# Patient Record
Sex: Male | Born: 1945 | Race: White | Hispanic: No | Marital: Married | Smoking: Never smoker
Health system: Southern US, Community
[De-identification: ages and names within clinical notes are randomized; demographics above are authoritative.]

## PROBLEM LIST (undated history)

## (undated) DIAGNOSIS — C801 Malignant (primary) neoplasm, unspecified: Secondary | ICD-10-CM

## (undated) DIAGNOSIS — M25561 Pain in right knee: Secondary | ICD-10-CM

## (undated) DIAGNOSIS — I10 Essential (primary) hypertension: Secondary | ICD-10-CM

## (undated) HISTORY — PX: TONSILLECTOMY: SUR1361

## (undated) HISTORY — PX: FOOT SURGERY: SHX648

---

## 2004-12-03 ENCOUNTER — Ambulatory Visit (HOSPITAL_COMMUNITY): Admission: RE | Admit: 2004-12-03 | Discharge: 2004-12-03 | Payer: Self-pay | Admitting: Gastroenterology

## 2009-05-20 ENCOUNTER — Ambulatory Visit (HOSPITAL_BASED_OUTPATIENT_CLINIC_OR_DEPARTMENT_OTHER): Admission: RE | Admit: 2009-05-20 | Discharge: 2009-05-20 | Payer: Self-pay | Admitting: Family Medicine

## 2009-05-26 ENCOUNTER — Ambulatory Visit: Payer: Self-pay | Admitting: Internal Medicine

## 2011-10-10 ENCOUNTER — Emergency Department (HOSPITAL_COMMUNITY)
Admission: EM | Admit: 2011-10-10 | Discharge: 2011-10-11 | Disposition: A | Discharge status: Home / Self Care | Payer: BC Managed Care – PPO | Attending: Emergency Medicine | Admitting: Emergency Medicine

## 2011-10-10 DIAGNOSIS — R42 Dizziness and giddiness: Secondary | ICD-10-CM | POA: Insufficient documentation

## 2011-10-10 DIAGNOSIS — Z7982 Long term (current) use of aspirin: Secondary | ICD-10-CM | POA: Insufficient documentation

## 2011-10-10 DIAGNOSIS — I10 Essential (primary) hypertension: Secondary | ICD-10-CM | POA: Insufficient documentation

## 2011-10-10 DIAGNOSIS — Z79899 Other long term (current) drug therapy: Secondary | ICD-10-CM | POA: Insufficient documentation

## 2011-10-10 DIAGNOSIS — M542 Cervicalgia: Secondary | ICD-10-CM | POA: Insufficient documentation

## 2011-10-10 DIAGNOSIS — M25519 Pain in unspecified shoulder: Secondary | ICD-10-CM | POA: Insufficient documentation

## 2011-10-10 HISTORY — DX: Essential (primary) hypertension: I10

## 2011-10-10 NOTE — ED Notes (Addendum)
C/o stiff L neck/shoulder, describes as achy & sore, checked BP and it was higher than usual. "feels off". (denies: HA, dizziness, nv, visual changes, CP, sob, numbness/ tingling or other sx), onset around 2145. Speech clear, no droop or drift, grips strength and coordination are equal and strong, PERRL 3mm.

## 2011-10-11 ENCOUNTER — Emergency Department (HOSPITAL_COMMUNITY): Payer: BC Managed Care – PPO

## 2011-10-11 ENCOUNTER — Encounter (HOSPITAL_COMMUNITY): Payer: Self-pay | Admitting: *Deleted

## 2011-10-11 LAB — URINALYSIS, ROUTINE W REFLEX MICROSCOPIC
Bilirubin Urine: NEGATIVE
Glucose, UA: NEGATIVE mg/dL
Hgb urine dipstick: NEGATIVE
Ketones, ur: NEGATIVE mg/dL
Leukocytes, UA: NEGATIVE
Nitrite: NEGATIVE
Protein, ur: NEGATIVE mg/dL
Specific Gravity, Urine: 1.011 (ref 1.005–1.030)
Urobilinogen, UA: 0.2 mg/dL (ref 0.0–1.0)
pH: 7 (ref 5.0–8.0)

## 2011-10-11 LAB — COMPREHENSIVE METABOLIC PANEL
ALT: 73 U/L — ABNORMAL HIGH (ref 0–53)
AST: 51 U/L — ABNORMAL HIGH (ref 0–37)
Albumin: 3.8 g/dL (ref 3.5–5.2)
Alkaline Phosphatase: 56 U/L (ref 39–117)
BUN: 21 mg/dL (ref 6–23)
CO2: 26 mEq/L (ref 19–32)
Calcium: 9.2 mg/dL (ref 8.4–10.5)
Chloride: 102 mEq/L (ref 96–112)
Creatinine, Ser: 0.8 mg/dL (ref 0.50–1.35)
GFR calc Af Amer: >90 mL/min (ref >90)
GFR calc non Af Amer: >90 mL/min (ref >90)
Glucose, Bld: 134 mg/dL — ABNORMAL HIGH (ref 70–99)
Potassium: 4.5 mEq/L (ref 3.5–5.1)
Sodium: 137 mEq/L (ref 135–145)
Total Bilirubin: 0.4 mg/dL (ref 0.3–1.2)
Total Protein: 6.6 g/dL (ref 6.0–8.3)

## 2011-10-11 LAB — CBC
HCT: 41.8 % (ref 39.0–52.0)
Hemoglobin: 14.9 g/dL (ref 13.0–17.0)
MCH: 30 pg (ref 26.0–34.0)
MCHC: 35.6 g/dL (ref 30.0–36.0)
MCV: 84.3 fL (ref 78.0–100.0)
Platelets: 154 10*3/uL (ref 150–400)
RBC: 4.96 MIL/uL (ref 4.22–5.81)
RDW: 13.7 % (ref 11.5–15.5)
WBC: 7.1 10*3/uL (ref 4.0–10.5)

## 2011-10-11 LAB — LIPASE, BLOOD: Lipase: 36 U/L (ref 11–59)

## 2011-10-11 LAB — TROPONIN I: Troponin I: <0.3 ng/mL (ref <0.30)

## 2011-10-11 MED ORDER — SODIUM CHLORIDE 0.9 % IV SOLN
INTRAVENOUS | Status: DC
  Administered 2011-10-11: 02:00:00 via INTRAVENOUS

## 2011-10-11 MED ORDER — SODIUM CHLORIDE 0.9 % IV BOLUS (SEPSIS)
250.0000 mL | Freq: Once | INTRAVENOUS | Status: AC
  Administered 2011-10-11: 250 mL via INTRAVENOUS

## 2011-10-11 NOTE — ED Provider Notes (Signed)
History     CSN: [id number]  Arrival date & time 10/10/11  2340   First MD Initiated Contact with Patient 10/11/11 [phone]      Chief Complaint  Patient presents with  . Hypertension  . Neck Pain    (Consider location/radiation/quality/duration/timing/severity/associated sxs/prior treatment) The history is provided by the patient and the spouse.   patient is a 66 year old male that around 10:00 had a lightheaded feeling and some mild left neck and shoulder discomfort and no anterior chest pain no shortness of breath no syncope no nausea no vomiting no history of similar pain. Past medical history significant only for hypertension no history of cardiac events. Patient stated that the discomfort was extremely mild and vague not severe.  Past Medical History  Diagnosis Date  . Hypertension     Past Surgical History  Procedure Date  . Foot surgery   . Tonsillectomy     No family history on file.  History  Substance Use Topics  . Smoking status: Never Smoker   . Smokeless tobacco: Not on file  . Alcohol Use: No      Review of Systems  Constitutional: Negative for fever and chills.  HENT: Positive for neck pain.   Eyes: Negative for redness.  Respiratory: Negative for shortness of breath.   Cardiovascular: Negative for chest pain and palpitations.  Gastrointestinal: Negative for nausea, vomiting, abdominal pain and diarrhea.  Genitourinary: Negative for dysuria.  Musculoskeletal: Negative for back pain.  Skin: Negative for rash.  Neurological: Positive for light-headedness. Negative for dizziness, syncope, weakness and headaches.  Hematological: Does not bruise/bleed easily.    Allergies  Review of patient's allergies indicates no known allergies.  Home Medications   Current Outpatient Rx  Name Route Sig Dispense Refill  . AMLODIPINE BESY-BENAZEPRIL HCL 5-10 MG PO CAPS Oral Take 1 capsule by mouth daily.    Marland Kitchen VITAMIN C 1000 MG PO TABS Oral Take 1,000 mg by mouth 2  (two) times daily.    . ASPIRIN EC 81 MG PO TBEC Oral Take 81 mg by mouth daily.    Marland Kitchen CALCIUM CITRATE-VITAMIN D 315-200 MG-UNIT PO TABS Oral Take 1 tablet by mouth 2 (two) times daily.    Marland Kitchen DOXAZOSIN MESYLATE 4 MG PO TABS Oral Take 4 mg by mouth daily.    . OMEGA-3 FATTY ACIDS 1000 MG PO CAPS Oral Take 2 g by mouth daily.    . ADULT MULTIVITAMIN W/MINERALS CH Oral Take 1 tablet by mouth daily.    . SAW PALMETTO (SERENOA REPENS) 500 MG PO CAPS Oral Take 500 mg by mouth daily.      BP 127/70  Pulse 83  Temp(Src) 98.2 F (36.8 C) (Oral)  Resp 17  Ht 5\' 11"  (1.803 m)  Wt 275 lb (124.739 kg)  BMI 38.35 kg/m2  SpO2 96%  Physical Exam  Nursing note and vitals reviewed. Constitutional: He is oriented to person, place, and time. He appears well-developed and well-nourished. No distress.  HENT:  Head: Normocephalic and atraumatic.  Mouth/Throat: Oropharynx is clear and moist.  Eyes: Conjunctivae and EOM are normal. Pupils are equal, round, and reactive to light.  Neck: Normal range of motion. Neck supple.  Cardiovascular: Normal rate, regular rhythm and normal heart sounds.   No murmur heard. Pulmonary/Chest: Effort normal and breath sounds normal. No respiratory distress. He has no wheezes. He has no rales. He exhibits no tenderness.  Abdominal: Soft. Bowel sounds are normal. There is no tenderness.  Musculoskeletal: Normal range of  motion. He exhibits no edema and no tenderness.  Neurological: He is oriented to person, place, and time. No cranial nerve deficit. He exhibits normal muscle tone. Coordination normal.  Skin: Skin is warm. No rash noted. No erythema.    ED Course  Procedures (including critical care time)  Labs Reviewed  COMPREHENSIVE METABOLIC PANEL - Abnormal; Notable for the following:    Glucose, Bld 134 (*)    AST 51 (*) HEMOLYSIS AT THIS LEVEL MAY AFFECT RESULT   ALT 73 (*)    All other components within normal limits  CBC  TROPONIN I  URINALYSIS, ROUTINE W  REFLEX MICROSCOPIC  LIPASE, BLOOD   Dg Chest 2 View  10/11/2011  *RADIOLOGY REPORT*  Clinical Data: Near-syncope; left-sided neck pain.  CHEST - 2 VIEW  Comparison: None.  Findings: The lungs are well-aerated and clear.  There is no evidence of focal opacification, pleural effusion or pneumothorax.  The heart is normal in size; the mediastinal contour is within normal limits.  No acute osseous abnormalities are seen.  IMPRESSION: No acute cardiopulmonary process seen.  Original Report Authenticated By: Tonia Ghent, M.D.    Date: 10/11/2011  Rate: 84  Rhythm: normal sinus rhythm  QRS Axis: normal  Intervals: normal  ST/T Wave abnormalities: nonspecific ST/T changes  Conduction Disutrbances:none  Narrative Interpretation:   Old EKG Reviewed: none available  Results for orders placed during the hospital encounter of 10/10/11  CBC      Component Value Range   WBC 7.1  4.0 - 10.5 (K/uL)   RBC 4.96  4.22 - 5.81 (MIL/uL)   Hemoglobin 14.9  13.0 - 17.0 (g/dL)   HCT 96.0  45.4 - 09.8 (%)   MCV 84.3  78.0 - 100.0 (fL)   MCH 30.0  26.0 - 34.0 (pg)   MCHC 35.6  30.0 - 36.0 (g/dL)   RDW 11.9  14.7 - 82.9 (%)   Platelets 154  150 - 400 (K/uL)  COMPREHENSIVE METABOLIC PANEL      Component Value Range   Sodium 137  135 - 145 (mEq/L)   Potassium 4.5  3.5 - 5.1 (mEq/L)   Chloride 102  96 - 112 (mEq/L)   CO2 26  19 - 32 (mEq/L)   Glucose, Bld 134 (*) 70 - 99 (mg/dL)   BUN 21  6 - 23 (mg/dL)   Creatinine, Ser 5.62  0.50 - 1.35 (mg/dL)   Calcium 9.2  8.4 - 13.0 (mg/dL)   Total Protein 6.6  6.0 - 8.3 (g/dL)   Albumin 3.8  3.5 - 5.2 (g/dL)   AST 51 (*) 0 - 37 (U/L)   ALT 73 (*) 0 - 53 (U/L)   Alkaline Phosphatase 56  39 - 117 (U/L)   Total Bilirubin 0.4  0.3 - 1.2 (mg/dL)   GFR calc non Af Amer >90  >90 (mL/min)   GFR calc Af Amer >90  >90 (mL/min)  TROPONIN I      Component Value Range   Troponin I <0.30  <0.30 (ng/mL)  URINALYSIS, ROUTINE W REFLEX MICROSCOPIC      Component Value Range     Color, Urine YELLOW  YELLOW    APPearance CLEAR  CLEAR    Specific Gravity, Urine 1.011  1.005 - 1.030    pH 7.0  5.0 - 8.0    Glucose, UA NEGATIVE  NEGATIVE (mg/dL)   Hgb urine dipstick NEGATIVE  NEGATIVE    Bilirubin Urine NEGATIVE  NEGATIVE    Ketones, ur NEGATIVE  NEGATIVE (mg/dL)   Protein, ur NEGATIVE  NEGATIVE (mg/dL)   Urobilinogen, UA 0.2  0.0 - 1.0 (mg/dL)   Nitrite NEGATIVE  NEGATIVE    Leukocytes, UA NEGATIVE  NEGATIVE   LIPASE, BLOOD      Component Value Range   Lipase 36  11 - 59 (U/L)     1. Lightheadedness       MDM  Patient's symptoms of some mild neck and left shoulder discomfort and lightheadedness no shortness of breath no chest pain around 10:00 without any significant abnormalities on labs. Patient had no chest discomfort at all. In the emergency department patient is felt fine no recurrent symptoms no new symptoms. Doubt acute cardiac event based on the EKG in at least one negative troponin marker was negative had no chest discomfort at all.       Shelda Jakes, MD 10/11/11 [phone]

## 2011-10-11 NOTE — Discharge Instructions (Signed)
Specific cause of tonight's symptoms is not clear. Lab workup was negative other than some mild elevation in liver enzymes. Return for any new worse symptoms. Followup with your primary care Dr. Dr. Andrey Campanile sometime in the next one to 2 weeks to have liver enzymes rechecked.

## 2011-10-11 NOTE — ED Notes (Signed)
Patient is AOx4 and comfortable with his discharge instructions. 

## 2011-10-23 ENCOUNTER — Other Ambulatory Visit (HOSPITAL_COMMUNITY): Payer: Self-pay | Admitting: Family Medicine

## 2011-10-23 DIAGNOSIS — M25561 Pain in right knee: Secondary | ICD-10-CM

## 2011-10-29 ENCOUNTER — Encounter (HOSPITAL_COMMUNITY): Payer: Self-pay

## 2011-10-29 ENCOUNTER — Encounter (HOSPITAL_COMMUNITY)
Admission: RE | Admit: 2011-10-29 | Discharge: 2011-10-29 | Disposition: A | Discharge status: Home / Self Care | Payer: BC Managed Care – PPO | Source: Ambulatory Visit | Attending: Family Medicine | Admitting: Family Medicine

## 2011-10-29 ENCOUNTER — Ambulatory Visit (HOSPITAL_COMMUNITY): Payer: BC Managed Care – PPO

## 2011-10-29 ENCOUNTER — Encounter (HOSPITAL_COMMUNITY): Payer: BC Managed Care – PPO

## 2011-10-29 DIAGNOSIS — M25561 Pain in right knee: Secondary | ICD-10-CM

## 2011-10-29 DIAGNOSIS — M25569 Pain in unspecified knee: Secondary | ICD-10-CM | POA: Insufficient documentation

## 2011-10-29 HISTORY — DX: Pain in right knee: M25.561

## 2011-10-29 MED ORDER — TECHNETIUM TC 99M MEDRONATE IV KIT
23.7000 | PACK | Freq: Once | INTRAVENOUS | Status: AC | PRN
  Administered 2011-10-29: 23.7 via INTRAVENOUS

## 2012-01-24 ENCOUNTER — Emergency Department (HOSPITAL_COMMUNITY): Payer: BC Managed Care – PPO

## 2012-01-24 ENCOUNTER — Encounter (HOSPITAL_COMMUNITY): Payer: Self-pay | Admitting: *Deleted

## 2012-01-24 ENCOUNTER — Emergency Department (HOSPITAL_COMMUNITY)
Admission: EM | Admit: 2012-01-24 | Discharge: 2012-01-24 | Disposition: A | Discharge status: Home / Self Care | Payer: BC Managed Care – PPO | Attending: Emergency Medicine | Admitting: Emergency Medicine

## 2012-01-24 DIAGNOSIS — K5900 Constipation, unspecified: Secondary | ICD-10-CM | POA: Insufficient documentation

## 2012-01-24 DIAGNOSIS — I10 Essential (primary) hypertension: Secondary | ICD-10-CM | POA: Insufficient documentation

## 2012-01-24 DIAGNOSIS — Z79899 Other long term (current) drug therapy: Secondary | ICD-10-CM | POA: Insufficient documentation

## 2012-01-24 DIAGNOSIS — R109 Unspecified abdominal pain: Secondary | ICD-10-CM | POA: Insufficient documentation

## 2012-01-24 DIAGNOSIS — K5730 Diverticulosis of large intestine without perforation or abscess without bleeding: Secondary | ICD-10-CM

## 2012-01-24 LAB — CBC WITH DIFFERENTIAL/PLATELET
Basophils Absolute: 0 10*3/uL (ref 0.0–0.1)
Basophils Relative: 0 % (ref 0–1)
Eosinophils Absolute: 0.1 10*3/uL (ref 0.0–0.7)
Eosinophils Relative: 1 % (ref 0–5)
HCT: 43.4 % (ref 39.0–52.0)
Hemoglobin: 15.4 g/dL (ref 13.0–17.0)
Lymphocytes Relative: 10 % — ABNORMAL LOW (ref 12–46)
Lymphs Abs: 1.2 10*3/uL (ref 0.7–4.0)
MCH: 30 pg (ref 26.0–34.0)
MCHC: 35.5 g/dL (ref 30.0–36.0)
MCV: 84.6 fL (ref 78.0–100.0)
Monocytes Absolute: 1.7 10*3/uL — ABNORMAL HIGH (ref 0.1–1.0)
Monocytes Relative: 15 % — ABNORMAL HIGH (ref 3–12)
Neutro Abs: 8.7 10*3/uL — ABNORMAL HIGH (ref 1.7–7.7)
Neutrophils Relative %: 74 % (ref 43–77)
Platelets: 156 10*3/uL (ref 150–400)
RBC: 5.13 MIL/uL (ref 4.22–5.81)
RDW: 13.3 % (ref 11.5–15.5)
WBC: 11.7 10*3/uL — ABNORMAL HIGH (ref 4.0–10.5)

## 2012-01-24 LAB — POCT I-STAT, CHEM 8
BUN: 15 mg/dL (ref 6–23)
Calcium, Ion: 1.15 mmol/L (ref 1.13–1.30)
Chloride: 102 mEq/L (ref 96–112)
Creatinine, Ser: 0.9 mg/dL (ref 0.50–1.35)
Glucose, Bld: 142 mg/dL — ABNORMAL HIGH (ref 70–99)
HCT: 46 % (ref 39.0–52.0)
Hemoglobin: 15.6 g/dL (ref 13.0–17.0)
Potassium: 3.9 mEq/L (ref 3.5–5.1)
Sodium: 139 mEq/L (ref 135–145)
TCO2: 23 mmol/L (ref 0–100)

## 2012-01-24 LAB — URINALYSIS, ROUTINE W REFLEX MICROSCOPIC
Bilirubin Urine: NEGATIVE
Glucose, UA: NEGATIVE mg/dL
Hgb urine dipstick: NEGATIVE
Ketones, ur: NEGATIVE mg/dL
Leukocytes, UA: NEGATIVE
Nitrite: NEGATIVE
Protein, ur: NEGATIVE mg/dL
Specific Gravity, Urine: 1.008 (ref 1.005–1.030)
Urobilinogen, UA: 0.2 mg/dL (ref 0.0–1.0)
pH: 6.5 (ref 5.0–8.0)

## 2012-01-24 MED ORDER — METRONIDAZOLE IN NACL 5-0.79 MG/ML-% IV SOLN
500.0000 mg | Freq: Once | INTRAVENOUS | Status: AC
  Administered 2012-01-24: 500 mg via INTRAVENOUS
  Filled 2012-01-24: qty 100

## 2012-01-24 MED ORDER — CIPROFLOXACIN HCL 500 MG PO TABS: 500.0000 mg | ORAL_TABLET | Freq: Two times a day (BID) | ORAL | Status: AC

## 2012-01-24 MED ORDER — IOHEXOL 300 MG/ML  SOLN
100.0000 mL | Freq: Once | INTRAMUSCULAR | Status: AC | PRN
  Administered 2012-01-24: 100 mL via INTRAVENOUS

## 2012-01-24 MED ORDER — HYDROCODONE-ACETAMINOPHEN 5-325 MG PO TABS: 1.0000 | ORAL_TABLET | Freq: Four times a day (QID) | ORAL | Status: AC | PRN

## 2012-01-24 MED ORDER — ACETAMINOPHEN 325 MG PO TABS
650.0000 mg | ORAL_TABLET | Freq: Once | ORAL | Status: AC
  Administered 2012-01-24: 650 mg via ORAL
  Filled 2012-01-24: qty 2

## 2012-01-24 MED ORDER — CIPROFLOXACIN IN D5W 400 MG/200ML IV SOLN
400.0000 mg | Freq: Once | INTRAVENOUS | Status: AC
  Administered 2012-01-24: 400 mg via INTRAVENOUS
  Filled 2012-01-24: qty 200

## 2012-01-24 MED ORDER — METRONIDAZOLE 500 MG PO TABS: 500.0000 mg | ORAL_TABLET | Freq: Two times a day (BID) | ORAL | Status: AC

## 2012-01-24 NOTE — ED Notes (Signed)
Pt. Reports constipation x 2 days. States he had a BM last night but it was hard. Reports pain in lower right abdomen. Denies N/V/D or fevers. Bowel sounds active, abdomen soft and minimally tender to palpitation. No rebound tenderness.

## 2012-01-24 NOTE — ED Notes (Signed)
C/o RLQ pain, onset yesterday 10am, denies other sx,  (Denies: nvd, fever).

## 2012-01-24 NOTE — ED Notes (Signed)
RN informed of pt temp.

## 2012-01-24 NOTE — ED Provider Notes (Signed)
History     CSN: [id number]  Arrival date & time 01/24/12  6295   First MD Initiated Contact with Patient 01/24/12 [phone]      Chief Complaint  Patient presents with  . Abdominal Pain    (Consider location/radiation/quality/duration/timing/severity/associated sxs/prior treatment) HPI The patient presents to the Emergency department with abdominal pain that began yesterday.  The pain is in both lower parts of his abdomen.  He denies nausea, vomiting, diarrhea, chest pain, shortness of breath, back pain, fevers, dysuria, weakness, or headache.  States he did feel chilled last night.  Patient denies any treatment prior to arrival.  Patient states that he's never had abdominal surgery.  The patient  states that palpation and movement make the pain worse. Past Medical History  Diagnosis Date  . Hypertension   . Knee pain, right     Past Surgical History  Procedure Date  . Foot surgery   . Tonsillectomy     No family history on file.  History  Substance Use Topics  . Smoking status: Never Smoker   . Smokeless tobacco: Not on file  . Alcohol Use: No      Review of Systems All other systems negative except as documented in the HPI. All pertinent positives and negatives as reviewed in the HPI.  Allergies  Review of patient's allergies indicates no known allergies.  Home Medications   Current Outpatient Rx  Name Route Sig Dispense Refill  . AMLODIPINE BESY-BENAZEPRIL HCL 5-10 MG PO CAPS Oral Take 1 capsule by mouth daily.    Marland Kitchen VITAMIN C 1000 MG PO TABS Oral Take 1,000 mg by mouth 2 (two) times daily.    . ASPIRIN EC 81 MG PO TBEC Oral Take 81 mg by mouth daily.    Marland Kitchen CALCIUM CITRATE-VITAMIN D 315-200 MG-UNIT PO TABS Oral Take 1 tablet by mouth 2 (two) times daily.    Marland Kitchen DOXAZOSIN MESYLATE 4 MG PO TABS Oral Take 4 mg by mouth daily.    . OMEGA-3 FATTY ACIDS 1000 MG PO CAPS Oral Take 2 g by mouth daily.    . ADULT MULTIVITAMIN W/MINERALS CH Oral Take 1 tablet by mouth daily.     . SAW PALMETTO (SERENOA REPENS) 500 MG PO CAPS Oral Take 500 mg by mouth daily.      BP 123/70  Pulse 88  Temp 98.6 F (37 C) (Oral)  Resp 18  SpO2 95%  Physical Exam  Constitutional: He is oriented to person, place, and time. He appears well-developed and well-nourished. No distress.  HENT:  Head: Normocephalic and atraumatic.  Cardiovascular: Normal rate, regular rhythm and normal heart sounds.   No murmur heard. Pulmonary/Chest: Effort normal and breath sounds normal. No respiratory distress.  Abdominal: Soft. Normal appearance and bowel sounds are normal. There is tenderness in the right lower quadrant and left lower quadrant. There is no rebound, no guarding and no CVA tenderness. No hernia.  Neurological: He is alert and oriented to person, place, and time.  Skin: Skin is warm and dry.    ED Course  Procedures (including critical care time)  Labs Reviewed  CBC WITH DIFFERENTIAL - Abnormal; Notable for the following:    WBC 11.7 (*)     Neutro Abs 8.7 (*)     Lymphocytes Relative 10 (*)     Monocytes Relative 15 (*)     Monocytes Absolute 1.7 (*)     All other components within normal limits  POCT I-STAT, CHEM 8 - Abnormal; Notable  for the following:    Glucose, Bld 142 (*)     All other components within normal limits  URINALYSIS, ROUTINE W REFLEX MICROSCOPIC   Ct Abdomen Pelvis W Contrast  01/24/2012  *RADIOLOGY REPORT*  Clinical Data: Right lower quadrant pain.  Constipation for 2 days.  CT ABDOMEN AND PELVIS WITH CONTRAST  Technique:  Multidetector CT imaging of the abdomen and pelvis was performed following the standard protocol during bolus administration of intravenous contrast.  Contrast: OMNIPAQUE IOHEXOL 300 MG/ML  SOLN  Comparison: None.  Findings: 2 mm left lower lobe lung nodule on image 7. Normal heart size without pericardial or pleural effusion.  The distal esophagus demonstrates possible mild thickening on image 6.  Tiny hiatal hernia.   Hepatomegaly, 19.4 cm.  A splenule.  Normal distal stomach, pancreas, gallbladder, biliary tract, left adrenal gland. The right adrenal is mildly nodular, at 1.5 cm on image 20.  Too small to characterize interpolar left renal lesion.  Normal right kidney.  Increased number of small retroperitoneal lymph nodes.  The largest is in the aortocaval space and measures 11 mm on image 33.  There is extensive sigmoid wall thickening and pericolonic edema. Concurrent diverticula in this area.  Example image 64.  No obstruction.  No free perforation or pericolonic abscess.  Normal terminal ileum.  The appendix may be diminutive on image 59, and there is no evidence of right lower quadrant inflammation. Normal small bowel without abdominal ascites.  Tiny fat containing right inguinal hernia.  Left pelvic adenopathy. Index left external iliac node measures 4.0 x 2.2 cm on image 61.  Normal urinary bladder.  Mild prostatomegaly, without significant free pelvic fluid.  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  L4-L5 anterolisthesis of 1.0 cm.  IMPRESSION:  1.  Findings most consistent with uncomplicated sigmoid diverticulitis.  Given the focality of wall thickening, superinfection of a neoplasm is possible but felt less likely. Consider optical colonoscopy follow-up. 2.  Moderate left pelvic and mild retroperitoneal abdominal adenopathy.  Favored to be reactive.  Recommend follow-up abdominal pelvic CT at 3 months to confirm resolution. 3.  Right adrenal nodule which is indeterminate.  On follow-up, consider dedicated precontrast imaging of the adrenal glands (with postcontrast imaging of the entire abdominal pelvis).  4.  Tiny left lung base nodule. If the patient is at high risk for bronchogenic carcinoma, follow-up chest CT at 1 year is recommended.  If the patient is at low risk, no follow-up is needed.   This recommendation follows the consensus  statement: "Guidelines for Management of Small Pulmonary Nodules Detected on CT Scans:  A Statement from the Fleischner Society" as published in Radiology 2005; 237:395-400.  Available online at: DietDisorder.cz. 5.  Small hiatal hernia with possible distal esophagitis.  Original Report Authenticated By: Consuello Bossier, M.D.    I explained all the results to the patient and felt that he needed admission, based on the fact that he had a temperature the.  The patient states that he will not be admitted because he has to go home and take care of his wife, who just had knee replacement surgery.  The patient states that he would return if there is any worsening in his condition.  As the patient that admission, at least overnight, was advisable and he still refused admission.  I advised the patient, that we'll have him followup with GI as well.  The patient voices  an understanding of leaving without being admitted could lead to worsening in his condition and he accepts that responsibility.    MDM  MDM Reviewed: nursing note and vitals Interpretation: labs and CT scan            Carlyle Dolly, PA-C 01/24/12 1421

## 2012-01-25 NOTE — ED Provider Notes (Signed)
Medical screening examination/treatment/procedure(s) were performed by non-physician practitioner and as supervising physician I was immediately available for consultation/collaboration.  Toy Baker, MD 01/25/12 [phone]

## 2013-09-22 DIAGNOSIS — M25569 Pain in unspecified knee: Secondary | ICD-10-CM | POA: Insufficient documentation

## 2016-01-08 DIAGNOSIS — M1990 Unspecified osteoarthritis, unspecified site: Secondary | ICD-10-CM | POA: Insufficient documentation

## 2016-01-08 DIAGNOSIS — E669 Obesity, unspecified: Secondary | ICD-10-CM | POA: Insufficient documentation

## 2016-01-08 DIAGNOSIS — N401 Enlarged prostate with lower urinary tract symptoms: Secondary | ICD-10-CM | POA: Insufficient documentation

## 2016-01-08 DIAGNOSIS — I10 Essential (primary) hypertension: Secondary | ICD-10-CM | POA: Insufficient documentation

## 2016-01-08 DIAGNOSIS — R7301 Impaired fasting glucose: Secondary | ICD-10-CM | POA: Insufficient documentation

## 2016-01-08 DIAGNOSIS — H9319 Tinnitus, unspecified ear: Secondary | ICD-10-CM | POA: Insufficient documentation

## 2016-01-08 DIAGNOSIS — G4733 Obstructive sleep apnea (adult) (pediatric): Secondary | ICD-10-CM | POA: Insufficient documentation

## 2016-01-17 DIAGNOSIS — Z0000 Encounter for general adult medical examination without abnormal findings: Secondary | ICD-10-CM

## 2017-08-04 ENCOUNTER — Other Ambulatory Visit: Payer: Self-pay

## 2017-08-04 ENCOUNTER — Encounter (HOSPITAL_COMMUNITY): Payer: Self-pay

## 2017-08-04 ENCOUNTER — Emergency Department (HOSPITAL_COMMUNITY): Payer: Medicare HMO

## 2017-08-04 ENCOUNTER — Emergency Department (HOSPITAL_COMMUNITY)
Admission: EM | Admit: 2017-08-04 | Discharge: 2017-08-04 | Disposition: A | Discharge status: Home / Self Care | Payer: Medicare HMO | Attending: Emergency Medicine | Admitting: Emergency Medicine

## 2017-08-04 DIAGNOSIS — T148XXA Other injury of unspecified body region, initial encounter: Secondary | ICD-10-CM | POA: Insufficient documentation

## 2017-08-04 DIAGNOSIS — S0121XA Laceration without foreign body of nose, initial encounter: Secondary | ICD-10-CM | POA: Diagnosis not present

## 2017-08-04 DIAGNOSIS — Y9301 Activity, walking, marching and hiking: Secondary | ICD-10-CM | POA: Diagnosis not present

## 2017-08-04 DIAGNOSIS — Z79899 Other long term (current) drug therapy: Secondary | ICD-10-CM | POA: Insufficient documentation

## 2017-08-04 DIAGNOSIS — W0110XA Fall on same level from slipping, tripping and stumbling with subsequent striking against unspecified object, initial encounter: Secondary | ICD-10-CM | POA: Diagnosis not present

## 2017-08-04 DIAGNOSIS — Z23 Encounter for immunization: Secondary | ICD-10-CM | POA: Insufficient documentation

## 2017-08-04 DIAGNOSIS — Y998 Other external cause status: Secondary | ICD-10-CM | POA: Insufficient documentation

## 2017-08-04 DIAGNOSIS — S0992XA Unspecified injury of nose, initial encounter: Secondary | ICD-10-CM | POA: Diagnosis present

## 2017-08-04 DIAGNOSIS — S022XXA Fracture of nasal bones, initial encounter for closed fracture: Secondary | ICD-10-CM | POA: Diagnosis not present

## 2017-08-04 DIAGNOSIS — T07XXXA Unspecified multiple injuries, initial encounter: Secondary | ICD-10-CM

## 2017-08-04 DIAGNOSIS — Y92830 Public park as the place of occurrence of the external cause: Secondary | ICD-10-CM | POA: Diagnosis not present

## 2017-08-04 DIAGNOSIS — I10 Essential (primary) hypertension: Secondary | ICD-10-CM | POA: Diagnosis not present

## 2017-08-04 MED ORDER — BACITRACIN ZINC 500 UNIT/GM EX OINT
TOPICAL_OINTMENT | Freq: Once | CUTANEOUS | Status: AC
  Administered 2017-08-04: 1 via TOPICAL
  Filled 2017-08-04: qty 0.9

## 2017-08-04 MED ORDER — OXYMETAZOLINE HCL 0.05 % NA SOLN: 1.0000 | Freq: Two times a day (BID) | NASAL | 0 refills | Status: DC

## 2017-08-04 MED ORDER — HYDROCODONE-ACETAMINOPHEN 5-325 MG PO TABS: 1.0000 | ORAL_TABLET | Freq: Four times a day (QID) | ORAL | 0 refills | Status: DC | PRN

## 2017-08-04 MED ORDER — TETANUS-DIPHTH-ACELL PERTUSSIS 5-2.5-18.5 LF-MCG/0.5 IM SUSP
0.5000 mL | Freq: Once | INTRAMUSCULAR | Status: AC
  Administered 2017-08-04: 0.5 mL via INTRAMUSCULAR
  Filled 2017-08-04: qty 0.5

## 2017-08-04 MED ORDER — LIDOCAINE-EPINEPHRINE-TETRACAINE (LET) SOLUTION
3.0000 mL | Freq: Once | NASAL | Status: AC
  Administered 2017-08-04: 3 mL via TOPICAL
  Filled 2017-08-04: qty 3

## 2017-08-04 MED ORDER — LIDOCAINE HCL (PF) 1 % IJ SOLN
5.0000 mL | Freq: Once | INTRAMUSCULAR | Status: AC
  Administered 2017-08-04: 5 mL via INTRADERMAL
  Filled 2017-08-04: qty 5

## 2017-08-04 MED ORDER — CEPHALEXIN 500 MG PO CAPS: 500.0000 mg | ORAL_CAPSULE | Freq: Three times a day (TID) | ORAL | 0 refills | Status: AC

## 2017-08-04 MED ORDER — OXYMETAZOLINE HCL 0.05 % NA SOLN
1.0000 | Freq: Once | NASAL | Status: AC
  Administered 2017-08-04: 1 via NASAL
  Filled 2017-08-04: qty 15

## 2017-08-04 NOTE — Discharge Instructions (Addendum)
Keep the wound clean and dry for the first 24 hours. After that you may gently clean the wound with soap and water. Make sure to pat dry the wound before covering it with any dressing. You can use topical antibiotic ointment and bandage. Ice and elevate for pain relief.   Take antibiotics as directed. Please take all of your antibiotics until finished.  You can take Tylenol 1000 mg three times a day for pain. You can take the pain medication for severe or breakthrough pain.   Return to the Emergency Department, your primary care doctor, or the [facility] [place] in 7 days for suture removal.   Avoid blowing her nose.  He can use the Afrin nasal spray to help with symptoms.  He will follow-up with the ear nose and throat doctor in 5 days.  His information is provided in the paperwork.  Return to the emergency department for any worsening pain of the nose, persistent bleeding from the nose.   Monitor closely for any signs of infection. Return to the Emergency Department for any worsening redness/swelling of the area that begins to spread, drainage from the site, worsening pain, fever or any other worsening or concerning symptoms.

## 2017-08-04 NOTE — ED Notes (Signed)
See provider assessment 

## 2017-08-04 NOTE — ED Notes (Signed)
Patient transported to CT 

## 2017-08-04 NOTE — ED Notes (Signed)
Wound cleaned and LET applied

## 2017-08-04 NOTE — ED Triage Notes (Signed)
Pt states while walking in park he tripped over crack in sidewalk he fell and hit face on pavement. Pt has abrasion to bridge of nose and has minimal bleeding from left nare. Denies LOC. Pt takes 81mg  ASA but denies blood thinner.

## 2017-08-04 NOTE — ED Provider Notes (Addendum)
[facility] EMERGENCY DEPARTMENT Provider Note   CSN: [id number] Arrival date & time: 08/04/17  1416     History   Chief Complaint Chief Complaint  Patient presents with  . Fall  . Epistaxis    HPI John [name] is a 72 y.o. male who presents for evaluation after mechanical fall that occurred approximately 2:30 PM this afternoon.  Patient reports that he was walking with his wife in the park when he tripped over something, causing him to fall forward.  Patient is unsure of how he fell but states that he did hit his face on the ground.  Wife who is with him denies any LOC.  Patient reports that he immediately started having some epistaxis and noticed some pain to his frontal head.  Wife who is been with him since the incident states that he has been in his normal behavior.  He has been able to ambulate without any difficulty.  Patient does report that he is having some head pain and notices some associated swelling to the frontal portion head.  Additionally, he reports pain to the bridge of the nose that extends down into the bilateral face.  Patient is currently on aspirin but denies any other blood thinners.  Patient denies any vision changes, vomiting, numbness/weakness of his arms or legs, chest pain, dizziness, neck pain, back pain, arm pain, jaw instability, dental abnormality.  Patient does not not know when his last tetanus shot was.  The history is provided by the patient.    Past Medical History:  Diagnosis Date  . Hypertension   . Knee pain, right     There are no active problems to display for this patient.   Past Surgical History:  Procedure Laterality Date  . FOOT SURGERY    . TONSILLECTOMY         Home Medications    Prior to Admission medications   Medication Sig Start Date End Date Taking? Authorizing Provider  amLODipine-benazepril (LOTREL) 5-10 MG per capsule Take 1 capsule by mouth daily.    [provider]  Ascorbic Acid  (VITAMIN C) 1000 MG tablet Take 1,000 mg by mouth 2 (two) times daily.    [provider]  aspirin EC 81 MG tablet Take 81 mg by mouth daily.    [provider]  calcium citrate-vitamin D (CITRACAL+D) 315-200 MG-UNIT per tablet Take 1 tablet by mouth 2 (two) times daily.    [provider]  cephALEXin (KEFLEX) 500 MG capsule Take 1 capsule (500 mg total) by mouth 3 (three) times daily for 7 days. 08/04/17 08/11/17  Volanda Napoleon, PA-C  doxazosin (CARDURA) 4 MG tablet Take 4 mg by mouth daily.    [provider]  fish oil-omega-3 fatty acids 1000 MG capsule Take 2 g by mouth daily.    [provider]  HYDROcodone-acetaminophen (NORCO/VICODIN) 5-325 MG tablet Take 1-2 tablets by mouth every 6 (six) hours as needed. 08/04/17   Volanda Napoleon, PA-C  Multiple Vitamin (MULITIVITAMIN WITH MINERALS) TABS Take 1 tablet by mouth daily.    [provider]  oxymetazoline (AFRIN NASAL SPRAY) 0.05 % nasal spray Place 1 spray into both nostrils 2 (two) times daily. 08/04/17   Volanda Napoleon, PA-C  saw palmetto 500 MG capsule Take 500 mg by mouth daily.    [provider]    Family History No family history on file.  Social History Social History   Tobacco Use  . Smoking status:  Never Smoker  . Smokeless tobacco: Never Used  Substance Use Topics  . Alcohol use: No  . Drug use: No     Allergies   Patient has no known allergies.   Review of Systems Review of Systems  HENT: Positive for facial swelling.   Eyes: Negative for visual disturbance.  Respiratory: Negative for shortness of breath.   Cardiovascular: Negative for chest pain.  Gastrointestinal: Negative for abdominal pain and vomiting.  Musculoskeletal: Negative for back pain and neck pain.  Skin: Positive for wound.  Neurological: Positive for headaches. Negative for weakness and numbness.  Psychiatric/Behavioral: Negative for confusion.     Physical Exam Updated  Vital Signs BP 116/63 (BP Location: Right Arm)   Pulse 84   Temp (!) 97.3 F (36.3 C) (Oral)   Resp 16   SpO2 98%   Physical Exam  Constitutional: He is oriented to person, place, and time. He appears well-developed and well-nourished.  HENT:  Head: Normocephalic and atraumatic. Head is without raccoon's eyes and without Battle's sign.    Right Ear: Tympanic membrane normal. No hemotympanum.  Left Ear: Tympanic membrane normal. No hemotympanum.  Mouth/Throat: Oropharynx is clear and moist and mucous membranes are normal.  Diffuse and scattered abrasions noted to the frontal aspect of his forehead.  Tenderness palpation to the nasal bridge with associated soft tissue swelling, No septal hematoma.  No deformity or crepitus noted.  He has mild tenderness palpation to the bilateral zygomatic arch.  No tenderness palpation noted.  Elevation/depression in lateral movement of the mandible intact without any difficulty.  No dental abnormalities.  No evidence of lip laceration.  He does have a small 0.75 lac noted to the right bridge of the nose.  Eyes: Conjunctivae, EOM and lids are normal. Pupils are equal, round, and reactive to light.  EOMs intact without any difficulty.  No evidence of entrapment.  No periorbital tenderness to palpation, deformity, crepitus.   Neck: Full passive range of motion without pain.  Full flexion/extension and lateral movement of neck fully intact. No bony midline tenderness. No deformities or crepitus.   Cardiovascular: Normal rate, regular rhythm, normal heart sounds and normal pulses. Exam reveals no gallop and no friction rub.  No murmur heard. Pulmonary/Chest: Effort normal and breath sounds normal.  Abdominal: Soft. Normal appearance. There is no tenderness. There is no rigidity and no guarding.  Musculoskeletal: Normal range of motion.  Neurological: He is alert and oriented to person, place, and time. GCS eye subscore is 4. GCS verbal subscore is 5. GCS motor  subscore is 6.  Cranial nerves III-XII intact Follows commands, Moves all extremities  5/5 strength to BUE and BLE  Sensation intact throughout all major nerve distributions Normal finger to nose. No dysdiadochokinesia. No pronator drift. No gait abnormalities  No slurred speech. No facial droop.   Skin: Skin is warm and dry. Capillary refill takes less than 2 seconds.  Psychiatric: He has a normal mood and affect. His speech is normal.  Nursing note and vitals reviewed.    ED Treatments / Results  Labs (all labs ordered are listed, but only abnormal results are displayed) Labs Reviewed - No data to display  EKG  EKG Interpretation None       Radiology Ct Head Wo Contrast  Result Date: 08/04/2017 CLINICAL DATA:  Fall, laceration to nose and chin EXAM: CT HEAD WITHOUT CONTRAST CT MAXILLOFACIAL WITHOUT CONTRAST TECHNIQUE: Multidetector CT imaging of the head and maxillofacial structures were performed using the standard  protocol without intravenous contrast. Multiplanar CT image reconstructions of the maxillofacial structures were also generated. COMPARISON:  None. FINDINGS: CT HEAD FINDINGS Brain: Mild diffuse cerebral atrophy. No acute intracranial abnormality. Specifically, no hemorrhage, hydrocephalus, mass lesion, acute infarction, or significant intracranial injury. Vascular: No hyperdense vessel or unexpected calcification. Skull: No acute calvarial abnormality. Other: None CT MAXILLOFACIAL FINDINGS Osseous: Fracture noted at the tip of the nasal bone, best seen on coronal image 43. Fracture of the anterior maxillary spine best seen on sagittal image 39. There is buckling of the nasal septum which is age indeterminate. Mandible and zygomatic arches are intact. Orbits: Negative. No traumatic or inflammatory finding. Sinuses: Small air-fluid level in the left maxillary sinus which may reflect blood. Mucosal thickening in the maxillary sinuses and ethmoid air cells as well as left  frontal sinus. Mastoid air cells are clear. Soft tissues: Negative IMPRESSION: Mild cerebral atrophy. No acute intracranial abnormality. Fractures through the tip of the nasal bone and the anterior maxillary spine. Buckling of the nasal septum is age indeterminate and could reflect an acute fracture, chronic fracture or developmental. Electronically Signed   By: Rolm Baptise M.D.   On: 08/04/2017 19:14   Ct Maxillofacial Wo Contrast  Result Date: 08/04/2017 CLINICAL DATA:  Fall, laceration to nose and chin EXAM: CT HEAD WITHOUT CONTRAST CT MAXILLOFACIAL WITHOUT CONTRAST TECHNIQUE: Multidetector CT imaging of the head and maxillofacial structures were performed using the standard protocol without intravenous contrast. Multiplanar CT image reconstructions of the maxillofacial structures were also generated. COMPARISON:  None. FINDINGS: CT HEAD FINDINGS Brain: Mild diffuse cerebral atrophy. No acute intracranial abnormality. Specifically, no hemorrhage, hydrocephalus, mass lesion, acute infarction, or significant intracranial injury. Vascular: No hyperdense vessel or unexpected calcification. Skull: No acute calvarial abnormality. Other: None CT MAXILLOFACIAL FINDINGS Osseous: Fracture noted at the tip of the nasal bone, best seen on coronal image 43. Fracture of the anterior maxillary spine best seen on sagittal image 39. There is buckling of the nasal septum which is age indeterminate. Mandible and zygomatic arches are intact. Orbits: Negative. No traumatic or inflammatory finding. Sinuses: Small air-fluid level in the left maxillary sinus which may reflect blood. Mucosal thickening in the maxillary sinuses and ethmoid air cells as well as left frontal sinus. Mastoid air cells are clear. Soft tissues: Negative IMPRESSION: Mild cerebral atrophy. No acute intracranial abnormality. Fractures through the tip of the nasal bone and the anterior maxillary spine. Buckling of the nasal septum is age indeterminate and  could reflect an acute fracture, chronic fracture or developmental. Electronically Signed   By: Rolm Baptise M.D.   On: 08/04/2017 19:14    Procedures .Marland KitchenLaceration Repair Date/Time: 08/04/2017 7:51 PM Performed by: Volanda Napoleon, PA-C Authorized by: Volanda Napoleon, PA-C   Consent:    Consent obtained:  Verbal   Consent given by:  Patient   Risks discussed:  Poor cosmetic result, pain and infection Anesthesia (see MAR for exact dosages):    Anesthesia method:  Topical application   Topical anesthetic:  LET Laceration details:    Location:  Face   Face location:  Nose   Length (cm):  0.8 Repair type:    Repair type:  Simple Pre-procedure details:    Preparation:  Patient was prepped and draped in usual sterile fashion Exploration:    Hemostasis achieved with:  LET   Wound exploration: wound explored through full range of motion     Wound extent: no foreign bodies/material noted   Treatment:  Area cleansed with:  Saline   Amount of cleaning:  Extensive   Irrigation solution:  Sterile saline   Irrigation method:  Syringe   Visualized foreign bodies/material removed: no   Skin repair:    Repair method:  Sutures   Suture size:  6-0   Suture material:  Prolene   Suture technique:  Simple interrupted   Number of sutures:  2 Approximation:    Approximation:  Close   Vermilion border: well-aligned   Post-procedure details:    Dressing:  Antibiotic ointment and sterile dressing   Patient tolerance of procedure:  Tolerated well, no immediate complications   (including critical care time)  Medications Ordered in ED Medications  oxymetazoline (AFRIN) 0.05 % nasal spray 1 spray (not administered)  Tdap (BOOSTRIX) injection 0.5 mL (0.5 mLs Intramuscular Given 08/04/17 1830)  lidocaine-EPINEPHrine-tetracaine (LET) solution (3 mLs Topical Given 08/04/17 1832)  lidocaine (PF) (XYLOCAINE) 1 % injection 5 mL (5 mLs Intradermal Given 08/04/17 1832)  bacitracin ointment (1  application Topical Given 08/04/17 1830)     Initial Impression / Assessment and Plan / ED Course  I have reviewed the triage vital signs and the nursing notes.  Pertinent labs & imaging results that were available during my care of the patient were reviewed by me and considered in my medical decision making (see chart for details).     72 year old male who presents for evaluation after mechanical fall that occurred abruptly 2:30 PM this afternoon.  Was walking with wife when he tripped over something, causing him to fall and land on the ground.  No LOC, vomiting, abnormal behavior.  Patient is on blood thinners.  Has experienced head pain and epistaxis since the incident.  Also complaining of some pain to the nose and face.  Patient is currently on aspirin but no other blood thinners. Patient is afebrile, non-toxic appearing, sitting comfortably on examination table. Vital signs reviewed and stable.  On exam, patient does have scattered abrasions noted to the forehead.  He does have an area of hematoma and tenderness palpation to the mid forehead.  There is a small 1 cm laceration to the bridge of the nose.  No neck pain or back pain. Given reassuring exam and NEXUS criteria, no indication for cervical imaging at this time.  Given mechanism of injury and that patient is on ASA, plan for imaging of the head.  Plan to update tenderness in the department.  We will provide wound care to repair laceration.  Laceration repaired as documented above.  Patient tolerated procedure well.  Once the wound was thoroughly and extensively irrigated, I explored the wound and it appeared to be very superficial.  Did not extend deep into the tissue.  No foreign bodies noted.  CT head negative for any acute abnormality.  CT maxillofacial shows fracture through the tip of the nasal bone in the anterior maxillary spine.  There is also mention of buckling of the nasal septum that is age-indeterminate but could reflect acute  fracture.  I discussed with Dr. Tyrone Nine who independently evaluated the patient.  Given concerns of buckling of the nasal septum, will plan to consult ENT.  I discussed with Dr. Redmond Baseman (ENT) regarding findings on CT.  He recommends having outpatient follow-up in his office in 5 days.  No other interventions needed in the ED.  I discussed plan with patient.  Given concerns of laceration over the nasal bridge an underlying fracture, will start patient on antibiotic.  We will also start patient  on Afrin to help with nose bleeding.  Instructed to follow-up with ENT as directed. Patient had ample opportunity for questions and discussion. All patient's questions were answered with full understanding. Strict return precautions discussed. Patient expresses understanding and agreement to plan.    Final Clinical Impressions(s) / ED Diagnoses   Final diagnoses:  Closed fracture of nasal bone, initial encounter  Laceration of nose, initial encounter  Abrasions of multiple sites    ED Discharge Orders        Ordered    HYDROcodone-acetaminophen (NORCO/VICODIN) 5-325 MG tablet  Every 6 hours PRN     08/04/17 2012    oxymetazoline (AFRIN NASAL SPRAY) 0.05 % nasal spray  2 times daily     08/04/17 2012    cephALEXin (KEFLEX) 500 MG capsule  3 times daily     08/04/17 2013       Desma Mcgregor 08/04/17 2122    Volanda Napoleon, PA-C 08/04/17 2123    Deno Etienne, DO 08/04/17 2306

## 2018-06-09 ENCOUNTER — Emergency Department (HOSPITAL_COMMUNITY): Payer: Medicare HMO

## 2018-06-09 ENCOUNTER — Encounter (HOSPITAL_COMMUNITY): Payer: Self-pay | Admitting: Emergency Medicine

## 2018-06-09 ENCOUNTER — Emergency Department (HOSPITAL_COMMUNITY)
Admission: EM | Admit: 2018-06-09 | Discharge: 2018-06-09 | Disposition: A | Discharge status: Home / Self Care | Payer: Medicare HMO | Attending: Emergency Medicine | Admitting: Emergency Medicine

## 2018-06-09 ENCOUNTER — Other Ambulatory Visit: Payer: Self-pay

## 2018-06-09 DIAGNOSIS — I10 Essential (primary) hypertension: Secondary | ICD-10-CM | POA: Diagnosis not present

## 2018-06-09 DIAGNOSIS — I471 Supraventricular tachycardia: Secondary | ICD-10-CM

## 2018-06-09 DIAGNOSIS — R42 Dizziness and giddiness: Secondary | ICD-10-CM | POA: Insufficient documentation

## 2018-06-09 DIAGNOSIS — Z7982 Long term (current) use of aspirin: Secondary | ICD-10-CM | POA: Diagnosis not present

## 2018-06-09 DIAGNOSIS — Z79899 Other long term (current) drug therapy: Secondary | ICD-10-CM | POA: Diagnosis not present

## 2018-06-09 LAB — CBC
HCT: 45.3 % (ref 39.0–52.0)
Hemoglobin: 14.9 g/dL (ref 13.0–17.0)
MCH: 28.6 pg (ref 26.0–34.0)
MCHC: 32.9 g/dL (ref 30.0–36.0)
MCV: 86.9 fL (ref 80.0–100.0)
Platelets: 152 10*3/uL (ref 150–400)
RBC: 5.21 MIL/uL (ref 4.22–5.81)
RDW: 13.2 % (ref 11.5–15.5)
WBC: 6.8 10*3/uL (ref 4.0–10.5)
nRBC: 0 % (ref 0.0–0.2)

## 2018-06-09 LAB — URINALYSIS, ROUTINE W REFLEX MICROSCOPIC
Bacteria, UA: NONE SEEN
Bilirubin Urine: NEGATIVE
Glucose, UA: NEGATIVE mg/dL
Hgb urine dipstick: NEGATIVE
Ketones, ur: NEGATIVE mg/dL
Leukocytes, UA: NEGATIVE
Nitrite: NEGATIVE
Protein, ur: 30 mg/dL — AB
Specific Gravity, Urine: 1.004 — ABNORMAL LOW (ref 1.005–1.030)
pH: 8 (ref 5.0–8.0)

## 2018-06-09 LAB — I-STAT TROPONIN, ED: Troponin i, poc: 0.02 ng/mL (ref 0.00–0.08)

## 2018-06-09 LAB — BASIC METABOLIC PANEL
Anion gap: 10 (ref 5–15)
BUN: 15 mg/dL (ref 8–23)
CO2: 24 mmol/L (ref 22–32)
Calcium: 9 mg/dL (ref 8.9–10.3)
Chloride: 105 mmol/L (ref 98–111)
Creatinine, Ser: 0.78 mg/dL (ref 0.61–1.24)
GFR calc Af Amer: >60 mL/min (ref >60)
GFR calc non Af Amer: >60 mL/min (ref >60)
Glucose, Bld: 134 mg/dL — ABNORMAL HIGH (ref 70–99)
Potassium: 3.9 mmol/L (ref 3.5–5.1)
Sodium: 139 mmol/L (ref 135–145)

## 2018-06-09 LAB — CBG MONITORING, ED: Glucose-Capillary: 99 mg/dL (ref 70–99)

## 2018-06-09 LAB — TSH: TSH: 6.12 u[IU]/mL — ABNORMAL HIGH (ref 0.350–4.500)

## 2018-06-09 LAB — T4, FREE: Free T4: 0.96 ng/dL (ref 0.82–1.77)

## 2018-06-09 MED ORDER — METOPROLOL TARTRATE 25 MG PO TABS: 12.5000 mg | ORAL_TABLET | Freq: Two times a day (BID) | ORAL | 0 refills | Status: DC

## 2018-06-09 MED ORDER — METOPROLOL TARTRATE 25 MG PO TABS
12.5000 mg | ORAL_TABLET | Freq: Once | ORAL | Status: AC
  Administered 2018-06-09: 12.5 mg via ORAL
  Filled 2018-06-09: qty 1

## 2018-06-09 NOTE — Discharge Instructions (Addendum)
It was our pleasure to provide your ER care today - we hope that you feel better.  The cardiologist indicates for you to start taking metoprolol 12.5 mg 2x/day, and stop taking the amlodipine, and to follow up with the cardiologist in their office in the next 1-2 weeks - call office tomorrow AM to arrange appointment.   Avoid caffeine use. Drink plenty of fluids.   From today's lab tests, one of your thyroid function tests is mildly elevated (TSH 6.1) - follow up with primary care doctor in the coming week - have them follow up on this result, as well as the other thyroid test that is still pending.   Return to ER if worse, new symptoms, chest pain, trouble breathing, fainting, other concern.

## 2018-06-09 NOTE — ED Triage Notes (Signed)
Pt reports last few days has been getting dizzy, started feeling palpitations yesterday. Denies pain. No other neuro sx. Pt takes 81mg  asa daily. No hx of of afib.

## 2018-06-09 NOTE — ED Notes (Signed)
The pt feels ok  He has a flashing heartrate that usually mens the heart rate is irregular  No irregularity seen

## 2018-06-09 NOTE — Consult Note (Signed)
Cardiology Consult    Patient ID: John [name] MRN: [id number], DOB/AGE: [birth year]   Admit date: 06/09/2018 Date of Consult: 06/09/2018  Primary Physician: Christain Sacramento, MD Primary Cardiologist: No primary care provider on file. Requesting Provider: Dr. Ashok Cordia  Patient Profile    John [name] is a 73 y.o. male with a history of HTN, who is being seen today for the evaluation of dizziness at the request of Dr. Ashok Cordia.  Past Medical History   Past Medical History:  Diagnosis Date  . Hypertension   . Knee pain, right     Past Surgical History:  Procedure Laterality Date  . FOOT SURGERY    . TONSILLECTOMY       Allergies  No Known Allergies  History of Present Illness    John [name] is a 73 y.o. male with a history of HTN, who is being seen today for the evaluation of dizziness at the request of Dr. Ashok Cordia.  The patient reports that he has overall been in good health and takes medications only for hypertension. Over the past approximately 48 hours he has noted several episodes of dizziness. He reports episodes will last for just a few seconds in duration but can occur at anytime - at rest, while standing. Are not related to changes in position. Has been enjoying the weather and doing yard work with his wife without any symptoms. He denies chest pain, shortness of breath, orthopnea, PND, LEE, palpitations, syncope. He otherwise has felt well. Due to recurrence of symptoms he presented to the ED tonight.  Upon arrival, a triage ECG showed a HR of 148 with narrow complex rhythm. This was a fleeting heart rate and could not be captured on ECG in the patient room. ECG showed sinus rhythm with HR 92. Vital signs otherwise unremarkable. Patient reports feeling asymptomatic at the time of my interview.   Inpatient Medications    . metoprolol tartrate  12.5 mg Oral Once    Family History    No family history on file. has no family status information on file.     Social History    Social History   Socioeconomic History  . Marital status: Married    Spouse name: Not on file  . Number of children: Not on file  . Years of education: Not on file  . Highest education level: Not on file  Occupational History  . Not on file  Social Needs  . Financial resource strain: Not on file  . Food insecurity:    Worry: Not on file    Inability: Not on file  . Transportation needs:    Medical: Not on file    Non-medical: Not on file  Tobacco Use  . Smoking status: Never Smoker  . Smokeless tobacco: Never Used  Substance and Sexual Activity  . Alcohol use: No  . Drug use: No  . Sexual activity: Not on file  Lifestyle  . Physical activity:    Days per week: Not on file    Minutes per session: Not on file  . Stress: Not on file  Relationships  . Social connections:    Talks on phone: Not on file    Gets together: Not on file    Attends religious service: Not on file    Active member of club or organization: Not on file    Attends meetings of clubs or organizations: Not on file    Relationship status: Not on file  . Intimate partner  violence:    Fear of current or ex partner: Not on file    Emotionally abused: Not on file    Physically abused: Not on file    Forced sexual activity: Not on file  Other Topics Concern  . Not on file  Social History Narrative  . Not on file     Review of Systems    General:  No chills, fever, night sweats or weight changes.  Cardiovascular:  No chest pain, dyspnea on exertion, edema, orthopnea, palpitations, paroxysmal nocturnal dyspnea. Dermatological: No rash, lesions/masses Respiratory: No cough, dyspnea Urologic: No hematuria, dysuria Abdominal:   No nausea, vomiting, diarrhea, bright red blood per rectum, melena, or hematemesis Neurologic:  No visual changes, wkns, changes in mental status. All other systems reviewed and are otherwise negative except as noted above.  Physical Exam    Blood  pressure 101/63, pulse (!) 123, temperature 97.9 F (36.6 C), temperature source Oral, resp. rate 16, height 5\' 11"  (1.803 m), weight 99.8 kg, SpO2 96 %.  General: Pleasant, NAD Psych: Normal affect. Neuro: Alert and oriented X 3. Moves all extremities spontaneously. HEENT: Normal  Neck: Supple without bruits or JVD. Lungs:  Resp regular and unlabored, CTA. Heart: RRR no s3, s4, or murmurs. Abdomen: Soft, non-tender, non-distended, BS + x 4.  Extremities: No clubbing, cyanosis. Trace pitting edema, equal bilaterally. DP/PT/Radials 2+ and equal bilaterally.  Labs    Troponin [facility] of Care Test) Recent Labs    06/09/18 2010  TROPIPOC 0.02   No results for input(s): CKTOTAL, CKMB, TROPONINI in the last 72 hours. Lab Results  Component Value Date   WBC 6.8 06/09/2018   HGB 14.9 06/09/2018   HCT 45.3 06/09/2018   MCV 86.9 06/09/2018   PLT 152 06/09/2018    Recent Labs  Lab 06/09/18 2005  NA 139  K 3.9  CL 105  CO2 24  BUN 15  CREATININE 0.78  CALCIUM 9.0  GLUCOSE 134*   No results found for: CHOL, HDL, LDLCALC, TRIG No results found for: [facility]   Radiology Studies    Dg Chest Port 1 View  Result Date: 06/09/2018 CLINICAL DATA:  Two-day history of dizziness, shortness of breath and lightheadedness. EXAM: PORTABLE CHEST 1 VIEW COMPARISON:  10/11/2011. FINDINGS: Cardiac silhouette and mediastinal contours normal in appearance for the AP portable technique, unchanged. Pulmonary parenchyma clear. Bronchovascular markings normal. Pulmonary vascularity normal. No pneumothorax. No visible pleural effusions. IMPRESSION: No acute cardiopulmonary disease. Electronically Signed   By: Evangeline Dakin M.D.   On: 06/09/2018 20:25    ECG & Cardiac Imaging    Triage ECG - personally reviewed. 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 Sinus rhythm with HR 92, no acute ischemic changes  Assessment & Plan    John [name]  is a 73 y.o. male with a history of HTN, who is being seen today for the evaluation of dizziness. 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 Patient did report dizziness at that time but at the time of my interview is now back to sinus rhythm and without symptoms. Laboratory results unremarkable. Blood pressure is currently in the 222L systolic and patient and wife report home BPs in the 110s. Would recommend stopping home amlodipine and start metoprolol 12.5mg  BID, first dose prior to leaving the ED and then patient can fill prescription at pharmacy in the AM.  I discussed the abnormal heart rhythm and potential for recurrence with the patient and his wife. He will start the metoprolol as above and check his blood pressure at least daily. He will call if frequently >741 systolic or <423 systolic. I will arrange for a holter monitor and follow-up in cardiology clinic. The patient was instructed to seek medical attention if episodes become longer in duration, if he has more severe symptoms, syncope, chest pain, shortness of breath. He works part time as a Geophysicist/field seismologist for [employer], scheduled to work Architectural technologist. I instructed him that he should take this off for the interim until further evaluation can be completed. They noted understanding and will seek further care if any of the above symptoms develop.  Signed, Bryna Colander, MD 06/09/2018, 9:58 PM  For questions or updates, please contact   Please consult [email or web address] for contact info under Cardiology/STEMI.

## 2018-06-09 NOTE — ED Provider Notes (Signed)
[facility] EMERGENCY DEPARTMENT Provider Note   CSN: [id number] Arrival date & time: 06/09/18  1918     History   Chief Complaint Chief Complaint  Patient presents with  . Palpitations  . Dizziness  . Tachycardia    HPI John [name] is a 73 y.o. male.  Patient with intermittent lightheadedness in past 2-3 days. States occurs sporadically, at rest, lasts seconds to several minutes. No syncope or loc. No falls or trauma. Denies hx similar symptoms in past. Denies palpitations, but spouse notes when she has checked his heart rate it has been 130's. Pt denies any current or recent chest pain or discomfort. No sob. No cough or uri symptoms. No fever or chills. Normal appetite. No nvd. No abd pain. No wt loss. Denies heat intol or sweats. Denies hx dysrhythmia. No hx cad.   The history is provided by the patient.  Palpitations   Pertinent negatives include no fever, no chest pain, no abdominal pain, no vomiting, no headaches, no back pain, no cough and no shortness of breath.  Dizziness  Associated symptoms: no chest pain, no diarrhea, no headaches, no shortness of breath and no vomiting     Past Medical History:  Diagnosis Date  . Hypertension   . Knee pain, right     There are no active problems to display for this patient.   Past Surgical History:  Procedure Laterality Date  . FOOT SURGERY    . TONSILLECTOMY          Home Medications    Prior to Admission medications   Medication Sig Start Date End Date Taking? Authorizing Provider  amLODipine-benazepril (LOTREL) 5-10 MG per capsule Take 1 capsule by mouth daily.    [provider]  Ascorbic Acid (VITAMIN C) 1000 MG tablet Take 1,000 mg by mouth 2 (two) times daily.    [provider]  aspirin EC 81 MG tablet Take 81 mg by mouth daily.    [provider]  calcium citrate-vitamin D (CITRACAL+D) 315-200 MG-UNIT per tablet Take 1 tablet by mouth 2 (two) times daily.     [provider]  doxazosin (CARDURA) 4 MG tablet Take 4 mg by mouth daily.    [provider]  fish oil-omega-3 fatty acids 1000 MG capsule Take 2 g by mouth daily.    [provider]  HYDROcodone-acetaminophen (NORCO/VICODIN) 5-325 MG tablet Take 1-2 tablets by mouth every 6 (six) hours as needed. 08/04/17   Volanda Napoleon, PA-C  Multiple Vitamin (MULITIVITAMIN WITH MINERALS) TABS Take 1 tablet by mouth daily.    [provider]  oxymetazoline (AFRIN NASAL SPRAY) 0.05 % nasal spray Place 1 spray into both nostrils 2 (two) times daily. 08/04/17   Volanda Napoleon, PA-C  saw palmetto 500 MG capsule Take 500 mg by mouth daily.    [provider]    Family History No family history on file.  Social History Social History   Tobacco Use  . Smoking status: Never Smoker  . Smokeless tobacco: Never Used  Substance Use Topics  . Alcohol use: No  . Drug use: No     Allergies   Patient has no known allergies.   Review of Systems Review of Systems  Constitutional: Negative for fever.  HENT: Negative for sore throat.   Eyes: Negative for redness.  Respiratory: Negative for cough and shortness of breath.   Cardiovascular: Negative for chest pain.  Gastrointestinal: Negative for abdominal pain, diarrhea and vomiting.  Genitourinary: Negative for flank pain.  Musculoskeletal: Negative for back pain and neck pain.  Skin: Negative for rash.  Neurological: Positive for light-headedness. Negative for syncope and headaches.  Hematological: Does not bruise/bleed easily.  Psychiatric/Behavioral: Negative for confusion.     Physical Exam Updated Vital Signs BP (!) 90/51 (BP Location: Right Arm)   Pulse (!) 149   Temp 97.9 F (36.6 C) (Oral)   Resp 16   Ht 1.803 m (5\' 11" )   Wt 99.8 kg   SpO2 98%   BMI 30.68 kg/m   Physical Exam Vitals signs and nursing note reviewed.  Constitutional:      Appearance: He is well-developed.  HENT:      Head: Atraumatic.     Nose: Nose normal.  Eyes:     Conjunctiva/sclera: Conjunctivae normal.  Neck:     Musculoskeletal: Normal range of motion and neck supple.     Trachea: No tracheal deviation.     Comments: No tm.  Cardiovascular:     Rate and Rhythm: Normal rate and regular rhythm.     Pulses: Normal pulses.     Heart sounds: Normal heart sounds. No murmur. No friction rub. No gallop.   Pulmonary:     Effort: Pulmonary effort is normal. No accessory muscle usage or respiratory distress.     Breath sounds: Normal breath sounds.  Abdominal:     General: Bowel sounds are normal. There is no distension.     Palpations: Abdomen is soft. There is no mass.     Tenderness: There is no abdominal tenderness.  Genitourinary:    Comments: No cva tenderness.  Musculoskeletal:     Right lower leg: No edema.     Left lower leg: No edema.  Skin:    General: Skin is warm and dry.     Findings: No rash.  Neurological:     Mental Status: He is alert and oriented to person, place, and time.     Comments: Speech clear/fluent. Steady gait.   Psychiatric:        Mood and Affect: Mood normal.      ED Treatments / Results  Labs (all labs ordered are listed, but only abnormal results are displayed) Results for orders placed or performed during the hospital encounter of 96/28/36  Basic metabolic panel  Result Value Ref Range   Sodium 139 135 - 145 mmol/L   Potassium 3.9 3.5 - 5.1 mmol/L   Chloride 105 98 - 111 mmol/L   CO2 24 22 - 32 mmol/L   Glucose, Bld 134 (H) 70 - 99 mg/dL   BUN 15 8 - 23 mg/dL   Creatinine, Ser 0.78 0.61 - 1.24 mg/dL   Calcium 9.0 8.9 - 10.3 mg/dL   GFR calc non Af Amer >60 >60 mL/min   GFR calc Af Amer >60 >60 mL/min   Anion gap 10 5 - 15  CBC  Result Value Ref Range   WBC 6.8 4.0 - 10.5 K/uL   RBC 5.21 4.22 - 5.81 MIL/uL   Hemoglobin 14.9 13.0 - 17.0 g/dL   HCT 45.3 39.0 - 52.0 %   MCV 86.9 80.0 - 100.0 fL   MCH 28.6 26.0 - 34.0 pg   MCHC 32.9 30.0 -  36.0 g/dL   RDW 13.2 11.5 - 15.5 %   Platelets 152 150 - 400 K/uL   nRBC 0.0 0.0 - 0.2 %  TSH  Result Value Ref Range   TSH 6.120 (H) 0.350 - 4.500  uIU/mL  CBG monitoring, ED  Result Value Ref Range   Glucose-Capillary 99 70 - 99 mg/dL  I-stat troponin, ED  Result Value Ref Range   Troponin i, poc 0.02 0.00 - 0.08 ng/mL   Comment 3           Dg Chest Port 1 View  Result Date: 06/09/2018 CLINICAL DATA:  Two-day history of dizziness, shortness of breath and lightheadedness. EXAM: PORTABLE CHEST 1 VIEW COMPARISON:  10/11/2011. FINDINGS: Cardiac silhouette and mediastinal contours normal in appearance for the AP portable technique, unchanged. Pulmonary parenchyma clear. Bronchovascular markings normal. Pulmonary vascularity normal. No pneumothorax. No visible pleural effusions. IMPRESSION: No acute cardiopulmonary disease. Electronically Signed   By: Evangeline Dakin M.D.   On: 06/09/2018 20:25    EKG None  Radiology Dg Chest [facility] 1 View  Result Date: 06/09/2018 CLINICAL DATA:  Two-day history of dizziness, shortness of breath and lightheadedness. EXAM: PORTABLE CHEST 1 VIEW COMPARISON:  10/11/2011. FINDINGS: Cardiac silhouette and mediastinal contours normal in appearance for the AP portable technique, unchanged. Pulmonary parenchyma clear. Bronchovascular markings normal. Pulmonary vascularity normal. No pneumothorax. No visible pleural effusions. IMPRESSION: No acute cardiopulmonary disease. Electronically Signed   By: Evangeline Dakin M.D.   On: 06/09/2018 20:25    Procedures Procedures (including critical care time)  Medications Ordered in ED Medications - No data to display   Initial Impression / Assessment and Plan / ED Course  I have reviewed the triage vital signs and the nursing notes.  Pertinent labs & imaging results that were available during my care of the patient were reviewed by me and considered in my medical decision making (see chart for details).  Iv ns. Labs  sent. Continuous pulse ox and monitor. Ecg.   In triage, patients initially in narrow complex tachycardia, hr 150. By time in room, pt in sinus rhythm, rate 90s (with resolution of symptoms).  Labs reviewed - chem normal.   cxr reviewed - no pna.  Cardiology consulted - cardiologist evaluated initial ecg (narrow complex tachy), and subsequent ecg (sinus rhythm) in ED - requests we start patient on metoprolol 12.5 mg bid, and have him stop the amlodipine, and f/u as outpatient in cardiology office.   Recheck pt - remains in sinus rhythm. No faintness or dizziness. No chest pain or discomfort. No sob.   Pt currently appears stable for d/c.     Final Clinical Impressions(s) / ED Diagnoses   Final diagnoses:  None    ED Discharge Orders    None       Lajean Saver, MD 06/09/18 2143

## 2018-06-09 NOTE — ED Notes (Signed)
The pt reports that for the past 3-4 days he has dizziness and feels weird  While he works outside he is ok  But just when he comes back into the house that he starts having his symptoms  No pain no history of the same

## 2018-06-10 ENCOUNTER — Other Ambulatory Visit: Payer: Self-pay | Admitting: Medical

## 2018-06-10 ENCOUNTER — Ambulatory Visit (INDEPENDENT_AMBULATORY_CARE_PROVIDER_SITE_OTHER): Payer: Medicare HMO

## 2018-06-10 DIAGNOSIS — I471 Supraventricular tachycardia: Secondary | ICD-10-CM

## 2018-06-14 ENCOUNTER — Ambulatory Visit: Payer: Medicare HMO | Admitting: Interventional Cardiology

## 2018-06-14 ENCOUNTER — Encounter: Payer: Self-pay | Admitting: Interventional Cardiology

## 2018-06-14 VITALS — BP 100/54 | HR 79 | Ht 71.0 in | Wt 221.4 lb

## 2018-06-14 DIAGNOSIS — R0602 Shortness of breath: Secondary | ICD-10-CM | POA: Diagnosis not present

## 2018-06-14 DIAGNOSIS — I10 Essential (primary) hypertension: Secondary | ICD-10-CM

## 2018-06-14 DIAGNOSIS — R55 Syncope and collapse: Secondary | ICD-10-CM | POA: Diagnosis not present

## 2018-06-14 NOTE — Progress Notes (Signed)
Cardiology Office Note   Date:  06/14/2018   ID:  John [name] 04-13-1946, MRN [id number]  PCP:  Christain Sacramento, MD    No chief complaint on file.  lightheadedness  Wt Readings from Last 3 Encounters:  06/14/18 221 lb 6.4 oz (100.4 kg)  06/09/18 220 lb (99.8 kg)  10/11/11 275 lb (124.7 kg)       History of Present Illness: John [name] is a 73 y.o. male who is being seen today for the evaluation of lightheadedness at the request of Christain Sacramento, MD.  He has had HTN for over 20 years.  Well controlled.   He was working in the yard and then came in on 06/08/2018.  He came inside and HR was increased to the 120s.  Same scenario happened the next day.  HR was up to 140.  He went to the ER.  ECG showed Sinus tach with PACs.    Monitor was ordered placed on 1/3.    In the ER on 06/09/2018, amlodipine was stopped and metoprolol was started.   HR 63.  Denies : Chest pain. Dizziness. Leg edema. Nitroglycerin use. Orthopnea. Palpitations. Paroxysmal nocturnal dyspnea.   Never fully passed out.  Had some shortness of breath.  Had a warning and was able to sit down.  He had not had as much fluid on 12/31compared to usual.  No problem with the activity.  Sx started after working.   Doxazosin for BPH. He has been on this for years.    He has lost 50 lbs over the past 8 months.    Brother with DM had a valve problem (likely aortic) and aortic aneurysm.  Past Medical History:  Diagnosis Date  . Hypertension   . Knee pain, right     Past Surgical History:  Procedure Laterality Date  . FOOT SURGERY    . TONSILLECTOMY       Current Outpatient Medications  Medication Sig Dispense Refill  . Ascorbic Acid (VITAMIN C WITH ROSE HIPS) 1000 MG tablet Take 1,000 mg by mouth daily.    Marland Kitchen aspirin EC 81 MG tablet Take 81 mg by mouth daily.    . benazepril (LOTENSIN) 10 MG tablet Take 10 mg by mouth daily.    . Calcium Citrate-Vitamin D (CALCIUM CITRATE + D PO) Take 1  tablet by mouth daily.    . clobetasol ointment (TEMOVATE) 8.56 % Apply 1 application topically 2 (two) times daily.    Marland Kitchen doxazosin (CARDURA) 4 MG tablet Take 4 mg by mouth daily.    . metoprolol tartrate (LOPRESSOR) 25 MG tablet Take 0.5 tablets (12.5 mg total) by mouth 2 (two) times daily. 30 tablet 0  . Multiple Vitamins-Minerals (MULTIVITAMIN PO) Take 1 tablet by mouth daily.    . Omega-3 Fatty Acids (OMEGA 3 PO) Take 1 tablet by mouth daily.    Marland Kitchen Specialty Vitamins Products (PROSTATE PO) Take 2 tablets by mouth daily.     No current facility-administered medications for this visit.     Allergies:   Patient has no known allergies.    Social History:  The patient  reports that he has never smoked. He has never used smokeless tobacco. He reports that he does not drink alcohol or use drugs.   Family History:  The patient's *family history includes Aortic aneurysm in his brother.    ROS:  Please see the history of present illness.   Otherwise, review of systems are positive for  palpitations.   All other systems are reviewed and negative.    PHYSICAL EXAM: VS:  BP (!) 100/54   Pulse 79   Ht 5\' 11"  (1.803 m)   Wt 221 lb 6.4 oz (100.4 kg)   SpO2 97%   BMI 30.88 kg/m  , BMI Body mass index is 30.88 kg/m. 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   ASSESSMENT AND PLAN:  1. Lightheadedness: I suspect this was related to some mild dehydration and his blood pressure medications.  He has lost over 50 pounds over the course of the last 7 to 8 months.  His blood pressure medication doses have not changed.  He may not need as much blood pressure medication as he was getting.  He feels better now on the metoprolol rather than the amlodipine. However, given ECG findings noted below, will await monitor results.  2. Palpitations: Occurred while feeling lightheaded.  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  He is wearing a monitor at this time.  Still not permitted to drive as of yet although he feels ok to do so.  3. Hypertension: If he continues to lose weight, may need to continue to decrease blood pressure medication doses.  Continue to watch.  I encouraged him to also stay well-hydrated. 4. Check echocardiogram.  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  We will also follow-up on his monitor results.   Current medicines are reviewed at length with the patient today.  The patient concerns regarding his medicines were addressed.  The following changes have been made:  No change  Labs/ tests ordered today include:   Orders Placed This Encounter  Procedures  . ECHOCARDIOGRAM COMPLETE    Recommend 150 minutes/week of aerobic exercise Low fat, low carb, high fiber diet recommended  Disposition:   FU based on results of test   Signed, Larae Grooms, MD  06/14/2018 [facility] [address], [place], [place]  [address] Phone: [phone]; Fax: [phone]

## 2018-06-14 NOTE — Patient Instructions (Signed)
Medication Instructions:  Your physician recommends that you continue on your current medications as directed. Please refer to the Current Medication list given to you today.  If you need a refill on your cardiac medications before your next appointment, please call your pharmacy.   Lab work: None Ordered  If you have labs (blood work) drawn today and your tests are completely normal, you will receive your results only by: Marland Kitchen MyChart Message (if you have MyChart) OR . A paper copy in the mail If you have any lab test that is abnormal or we need to change your treatment, we will call you to review the results.  Testing/Procedures: Your physician has requested that you have an echocardiogram. Echocardiography is a painless test that uses sound waves to create images of your heart. It provides your doctor with information about the size and shape of your heart and how well your heart's chambers and valves are working. This procedure takes approximately one hour. There are no restrictions for this procedure.  Follow-Up: . Based on test results  Any Other Special Instructions Will Be Listed Below (If Applicable).   Echocardiogram An echocardiogram is a procedure that uses painless sound waves (ultrasound) to produce an image of the heart. Images from an echocardiogram can provide important information about:  Signs of coronary artery disease (CAD).  Aneurysm detection. An aneurysm is a weak or damaged part of an artery wall that bulges out from the normal force of blood pumping through the body.  Heart size and shape. Changes in the size or shape of the heart can be associated with certain conditions, including heart failure, aneurysm, and CAD.  Heart muscle function.  Heart valve function.  Signs of a past heart attack.  Fluid buildup around the heart.  Thickening of the heart muscle.  A tumor or infectious growth around the heart valves. Tell a health care provider about:  Any  allergies you have.  All medicines you are taking, including vitamins, herbs, eye drops, creams, and over-the-counter medicines.  Any blood disorders you have.  Any surgeries you have had.  Any medical conditions you have.  Whether you are pregnant or may be pregnant. What are the risks? Generally, this is a safe procedure. However, problems may occur, including:  Allergic reaction to dye (contrast) that may be used during the procedure. What happens before the procedure? No specific preparation is needed. You may eat and drink normally. What happens during the procedure?   An IV tube may be inserted into one of your veins.  You may receive contrast through this tube. A contrast is an injection that improves the quality of the pictures from your heart.  A gel will be applied to your chest.  A wand-like tool (transducer) will be moved over your chest. The gel will help to transmit the sound waves from the transducer.  The sound waves will harmlessly bounce off of your heart to allow the heart images to be captured in real-time motion. The images will be recorded on a computer. The procedure may vary among health care providers and hospitals. What happens after the procedure?  You may return to your normal, everyday life, including diet, activities, and medicines, unless your health care provider tells you not to do that. Summary  An echocardiogram is a procedure that uses painless sound waves (ultrasound) to produce an image of the heart.  Images from an echocardiogram can provide important information about the size and shape of your heart, heart muscle  function, heart valve function, and fluid buildup around your heart.  You do not need to do anything to prepare before this procedure. You may eat and drink normally.  After the echocardiogram is completed, you may return to your normal, everyday life, unless your health care provider tells you not to do that. This  information is not intended to replace advice given to you by your health care provider. Make sure you discuss any questions you have with your health care provider. Document Released: 05/23/2000 Document Revised: 06/28/2016 Document Reviewed: 06/28/2016 Elsevier Interactive Patient Education  2019 [employer].

## 2018-06-18 ENCOUNTER — Ambulatory Visit: Payer: Medicare Other | Admitting: Cardiology

## 2018-06-23 ENCOUNTER — Ambulatory Visit (HOSPITAL_COMMUNITY): Payer: Medicare HMO | Attending: Internal Medicine

## 2018-06-23 ENCOUNTER — Other Ambulatory Visit: Payer: Self-pay

## 2018-06-23 DIAGNOSIS — R55 Syncope and collapse: Secondary | ICD-10-CM | POA: Diagnosis present

## 2018-06-24 ENCOUNTER — Telehealth: Payer: Self-pay

## 2018-06-24 NOTE — Telephone Encounter (Signed)
-----   Message from Jettie Booze, MD sent at 06/23/2018  4:39 PM EST ----- Normal LV function.  Normal valvular function. Await monitor results.

## 2018-06-24 NOTE — Telephone Encounter (Signed)
The patient has been notified of the result and verbalized understanding. Patient requesting a note to resume driving. Per Dr. Irish Lack in [place] note, "Check echocardiogram.  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 Patient verbalized understanding and thanked me for the call.

## 2018-06-24 NOTE — Telephone Encounter (Signed)
Follow Up:      Returning your call from today. 

## 2018-06-24 NOTE — Telephone Encounter (Signed)
Left message for patient to call back  

## 2018-07-15 ENCOUNTER — Telehealth: Payer: Self-pay | Admitting: Interventional Cardiology

## 2018-07-15 NOTE — Telephone Encounter (Signed)
Follow Up:     Returning your call, concerning his Monitor results.

## 2018-07-15 NOTE — Telephone Encounter (Signed)
The patient has been notified of the result and verbalized understanding.  All questions (if any) were answered. Cleon Gustin, RN 07/15/2018 1:35 PM

## 2018-07-15 NOTE — Telephone Encounter (Signed)
-----   Message from Jettie Booze, MD sent at 07/15/2018 11:00 AM EST ----- No significant arrhtyhmias.

## 2019-05-15 DIAGNOSIS — L300 Nummular dermatitis: Secondary | ICD-10-CM | POA: Insufficient documentation

## 2019-05-15 DIAGNOSIS — L570 Actinic keratosis: Secondary | ICD-10-CM | POA: Insufficient documentation

## 2020-04-09 DIAGNOSIS — C801 Malignant (primary) neoplasm, unspecified: Secondary | ICD-10-CM

## 2020-04-09 HISTORY — DX: Malignant (primary) neoplasm, unspecified: C80.1

## 2020-04-20 ENCOUNTER — Inpatient Hospital Stay (HOSPITAL_COMMUNITY)
Admission: EM | Admit: 2020-04-20 | Discharge: 2020-04-26 | DRG: 435 | Disposition: A | Discharge status: Home Health | Payer: Medicare HMO | Attending: Internal Medicine | Admitting: Internal Medicine

## 2020-04-20 ENCOUNTER — Other Ambulatory Visit: Payer: Self-pay

## 2020-04-20 ENCOUNTER — Encounter (HOSPITAL_COMMUNITY): Payer: Self-pay | Admitting: Emergency Medicine

## 2020-04-20 ENCOUNTER — Emergency Department (HOSPITAL_COMMUNITY): Payer: Medicare HMO

## 2020-04-20 DIAGNOSIS — E790 Hyperuricemia without signs of inflammatory arthritis and tophaceous disease: Secondary | ICD-10-CM | POA: Diagnosis present

## 2020-04-20 DIAGNOSIS — C8333 Diffuse large B-cell lymphoma, intra-abdominal lymph nodes: Secondary | ICD-10-CM | POA: Diagnosis present

## 2020-04-20 DIAGNOSIS — Z8249 Family history of ischemic heart disease and other diseases of the circulatory system: Secondary | ICD-10-CM

## 2020-04-20 DIAGNOSIS — Z20822 Contact with and (suspected) exposure to covid-19: Secondary | ICD-10-CM | POA: Diagnosis present

## 2020-04-20 DIAGNOSIS — Z833 Family history of diabetes mellitus: Secondary | ICD-10-CM

## 2020-04-20 DIAGNOSIS — C381 Malignant neoplasm of anterior mediastinum: Secondary | ICD-10-CM | POA: Diagnosis present

## 2020-04-20 DIAGNOSIS — R222 Localized swelling, mass and lump, trunk: Secondary | ICD-10-CM | POA: Diagnosis not present

## 2020-04-20 DIAGNOSIS — R000 Tachycardia, unspecified: Secondary | ICD-10-CM | POA: Diagnosis present

## 2020-04-20 DIAGNOSIS — K5900 Constipation, unspecified: Secondary | ICD-10-CM | POA: Diagnosis present

## 2020-04-20 DIAGNOSIS — K661 Hemoperitoneum: Secondary | ICD-10-CM | POA: Diagnosis not present

## 2020-04-20 DIAGNOSIS — R590 Localized enlarged lymph nodes: Secondary | ICD-10-CM | POA: Diagnosis present

## 2020-04-20 DIAGNOSIS — I9581 Postprocedural hypotension: Secondary | ICD-10-CM | POA: Diagnosis not present

## 2020-04-20 DIAGNOSIS — C229 Malignant neoplasm of liver, not specified as primary or secondary: Principal | ICD-10-CM | POA: Diagnosis present

## 2020-04-20 DIAGNOSIS — G4733 Obstructive sleep apnea (adult) (pediatric): Secondary | ICD-10-CM | POA: Diagnosis present

## 2020-04-20 DIAGNOSIS — E883 Tumor lysis syndrome: Secondary | ICD-10-CM | POA: Diagnosis present

## 2020-04-20 DIAGNOSIS — N179 Acute kidney failure, unspecified: Secondary | ICD-10-CM | POA: Diagnosis not present

## 2020-04-20 DIAGNOSIS — N400 Enlarged prostate without lower urinary tract symptoms: Secondary | ICD-10-CM | POA: Diagnosis present

## 2020-04-20 DIAGNOSIS — D62 Acute posthemorrhagic anemia: Secondary | ICD-10-CM | POA: Diagnosis not present

## 2020-04-20 DIAGNOSIS — J9859 Other diseases of mediastinum, not elsewhere classified: Secondary | ICD-10-CM

## 2020-04-20 DIAGNOSIS — R54 Age-related physical debility: Secondary | ICD-10-CM | POA: Diagnosis present

## 2020-04-20 DIAGNOSIS — I10 Essential (primary) hypertension: Secondary | ICD-10-CM | POA: Diagnosis present

## 2020-04-20 DIAGNOSIS — E860 Dehydration: Secondary | ICD-10-CM | POA: Diagnosis present

## 2020-04-20 DIAGNOSIS — Z808 Family history of malignant neoplasm of other organs or systems: Secondary | ICD-10-CM

## 2020-04-20 DIAGNOSIS — R160 Hepatomegaly, not elsewhere classified: Secondary | ICD-10-CM | POA: Diagnosis present

## 2020-04-20 DIAGNOSIS — D696 Thrombocytopenia, unspecified: Secondary | ICD-10-CM | POA: Diagnosis present

## 2020-04-20 DIAGNOSIS — Z7982 Long term (current) use of aspirin: Secondary | ICD-10-CM

## 2020-04-20 LAB — URINALYSIS, ROUTINE W REFLEX MICROSCOPIC
Bilirubin Urine: NEGATIVE
Glucose, UA: NEGATIVE mg/dL
Hgb urine dipstick: NEGATIVE
Ketones, ur: NEGATIVE mg/dL
Leukocytes,Ua: NEGATIVE
Nitrite: NEGATIVE
Protein, ur: NEGATIVE mg/dL
Specific Gravity, Urine: 1.01 (ref 1.005–1.030)
pH: 6 (ref 5.0–8.0)

## 2020-04-20 LAB — CBC WITH DIFFERENTIAL/PLATELET
Abs Immature Granulocytes: 0.03 10*3/uL (ref 0.00–0.07)
Basophils Absolute: 0 10*3/uL (ref 0.0–0.1)
Basophils Relative: 1 %
Eosinophils Absolute: 0.1 10*3/uL (ref 0.0–0.5)
Eosinophils Relative: 2 %
HCT: 36.3 % — ABNORMAL LOW (ref 39.0–52.0)
Hemoglobin: 12.2 g/dL — ABNORMAL LOW (ref 13.0–17.0)
Immature Granulocytes: 1 %
Lymphocytes Relative: 6 %
Lymphs Abs: 0.4 10*3/uL — ABNORMAL LOW (ref 0.7–4.0)
MCH: 29.3 pg (ref 26.0–34.0)
MCHC: 33.6 g/dL (ref 30.0–36.0)
MCV: 87.3 fL (ref 80.0–100.0)
Monocytes Absolute: 0.8 10*3/uL (ref 0.1–1.0)
Monocytes Relative: 14 %
Neutro Abs: 4.4 10*3/uL (ref 1.7–7.7)
Neutrophils Relative %: 76 %
Platelets: 150 10*3/uL (ref 150–400)
RBC: 4.16 MIL/uL — ABNORMAL LOW (ref 4.22–5.81)
RDW: 13.4 % (ref 11.5–15.5)
WBC: 5.8 10*3/uL (ref 4.0–10.5)
nRBC: 0 % (ref 0.0–0.2)

## 2020-04-20 LAB — CBC
HCT: 38.6 % — ABNORMAL LOW (ref 39.0–52.0)
Hemoglobin: 12.6 g/dL — ABNORMAL LOW (ref 13.0–17.0)
MCH: 28.7 pg (ref 26.0–34.0)
MCHC: 32.6 g/dL (ref 30.0–36.0)
MCV: 87.9 fL (ref 80.0–100.0)
Platelets: 147 10*3/uL — ABNORMAL LOW (ref 150–400)
RBC: 4.39 MIL/uL (ref 4.22–5.81)
RDW: 13.2 % (ref 11.5–15.5)
WBC: 6.4 10*3/uL (ref 4.0–10.5)
nRBC: 0 % (ref 0.0–0.2)

## 2020-04-20 LAB — RESPIRATORY PANEL BY RT PCR (FLU A&B, COVID)
Influenza A by PCR: NEGATIVE
Influenza B by PCR: NEGATIVE
SARS Coronavirus 2 by RT PCR: NEGATIVE

## 2020-04-20 LAB — BASIC METABOLIC PANEL
Anion gap: 11 (ref 5–15)
BUN: 40 mg/dL — ABNORMAL HIGH (ref 8–23)
CO2: 32 mmol/L (ref 22–32)
Calcium: >15 mg/dL (ref 8.9–10.3)
Chloride: 93 mmol/L — ABNORMAL LOW (ref 98–111)
Creatinine, Ser: 2.65 mg/dL — ABNORMAL HIGH (ref 0.61–1.24)
GFR, Estimated: 25 mL/min — ABNORMAL LOW (ref >60)
Glucose, Bld: 94 mg/dL (ref 70–99)
Potassium: 3.9 mmol/L (ref 3.5–5.1)
Sodium: 136 mmol/L (ref 135–145)

## 2020-04-20 LAB — COMPREHENSIVE METABOLIC PANEL
ALT: 32 U/L (ref 0–44)
AST: 71 U/L — ABNORMAL HIGH (ref 15–41)
Albumin: 3.4 g/dL — ABNORMAL LOW (ref 3.5–5.0)
Alkaline Phosphatase: 93 U/L (ref 38–126)
Anion gap: 11 (ref 5–15)
BUN: 43 mg/dL — ABNORMAL HIGH (ref 8–23)
CO2: 28 mmol/L (ref 22–32)
Calcium: >15 mg/dL (ref 8.9–10.3)
Chloride: 91 mmol/L — ABNORMAL LOW (ref 98–111)
Creatinine, Ser: 2.67 mg/dL — ABNORMAL HIGH (ref 0.61–1.24)
GFR, Estimated: 25 mL/min — ABNORMAL LOW (ref >60)
Glucose, Bld: 91 mg/dL (ref 70–99)
Potassium: 4.1 mmol/L (ref 3.5–5.1)
Sodium: 130 mmol/L — ABNORMAL LOW (ref 135–145)
Total Bilirubin: 1.6 mg/dL — ABNORMAL HIGH (ref 0.3–1.2)
Total Protein: 5.7 g/dL — ABNORMAL LOW (ref 6.5–8.1)

## 2020-04-20 LAB — CBG MONITORING, ED: Glucose-Capillary: 92 mg/dL (ref 70–99)

## 2020-04-20 LAB — MAGNESIUM: Magnesium: 2 mg/dL (ref 1.7–2.4)

## 2020-04-20 MED ORDER — SODIUM CHLORIDE 0.9 % IV SOLN: INTRAVENOUS | Status: DC

## 2020-04-20 MED ORDER — SODIUM CHLORIDE 0.9 % IV BOLUS
2000.0000 mL | Freq: Once | INTRAVENOUS | Status: AC
  Administered 2020-04-20: 2000 mL via INTRAVENOUS

## 2020-04-20 NOTE — ED Triage Notes (Signed)
Pt states he has been constipated and seen at a walk in clinic. Had labs done today that showed high calcium and abnormal kidney labs. Endorses weakness for 2 weeks. Negative rapid covid today.

## 2020-04-20 NOTE — ED Notes (Addendum)
PT BACK FROM CT, NADN, pt back on ccm.

## 2020-04-20 NOTE — ED Notes (Signed)
Patient transported to CT 

## 2020-04-20 NOTE — ED Provider Notes (Signed)
[place] EMERGENCY DEPARTMENT Provider Note   CSN: [id number] Arrival date & time: 04/20/20  1612     History Chief Complaint  Patient presents with  . Weakness  . Abnormal Lab    John [name] is a 74 y.o. male history of hypertension here presenting with abnormal labs.  Patient states that he had poor appetite and constipation about a week ago.  He had some labs done and his kidney function was abnormal.  Patient was started on MiraLAX and states that he has normal bowel movements now.  He states that for the last week or so, he has persistent nausea.Marland Kitchen He also still feels weak.  He states that he has shortness of breath with minimal exertion now.  Denies any chest pain or fevers or vomiting.  Patient went to [facility] clinic for repeat blood work and was noted to have a calcium greater than 15 and was in acute renal failure with creatinine is 2.6.  He was sent in for further evaluation.  Patient has no history of myeloma in the past.  Patient denies any issues with parathyroidism.  He is taking calcium supplementation however.  The history is provided by the patient.       Past Medical History:  Diagnosis Date  . Hypertension   . Knee pain, right     Patient Active Problem List   Diagnosis Date Noted  . Essential hypertension 06/14/2018    Past Surgical History:  Procedure Laterality Date  . FOOT SURGERY    . TONSILLECTOMY         Family History  Problem Relation Age of Onset  . Aortic aneurysm Brother     Social History   Tobacco Use  . Smoking status: Never Smoker  . Smokeless tobacco: Never Used  Vaping Use  . Vaping Use: Never used  Substance Use Topics  . Alcohol use: No  . Drug use: No    Home Medications Prior to Admission medications   Medication Sig Start Date End Date Taking? Authorizing Provider  Ascorbic Acid (VITAMIN C WITH ROSE HIPS) 1000 MG tablet Take 1,000 mg by mouth daily.    [provider]  aspirin EC 81  MG tablet Take 81 mg by mouth daily.    [provider]  benazepril (LOTENSIN) 10 MG tablet Take 10 mg by mouth daily.    [provider]  Calcium Citrate-Vitamin D (CALCIUM CITRATE + D PO) Take 1 tablet by mouth daily.    [provider]  clobetasol ointment (TEMOVATE) 5.18 % Apply 1 application topically 2 (two) times daily.    [provider]  doxazosin (CARDURA) 4 MG tablet Take 4 mg by mouth daily.    [provider]  metoprolol tartrate (LOPRESSOR) 25 MG tablet Take 0.5 tablets (12.5 mg total) by mouth 2 (two) times daily. 06/09/18   Lajean Saver, MD  Multiple Vitamins-Minerals (MULTIVITAMIN PO) Take 1 tablet by mouth daily.    [provider]  Omega-3 Fatty Acids (OMEGA 3 PO) Take 1 tablet by mouth daily.    [provider]  Specialty Vitamins Products (PROSTATE PO) Take 2 tablets by mouth daily.    [provider]    Allergies    Patient has no known allergies.  Review of Systems   Review of Systems  Neurological: Positive for weakness.  All other systems reviewed and are negative.   Physical Exam Updated Vital Signs BP (!) 155/72   Pulse 86  Temp 98.9 F (37.2 C) (Oral)   Resp 16   Ht 5\' 11"  (1.803 m)   Wt 99.8 kg   SpO2 99%   BMI 30.68 kg/m   Physical Exam Vitals and nursing note reviewed.  HENT:     Head: Normocephalic.     Nose: Nose normal.     Mouth/Throat:     Mouth: Mucous membranes are dry.  Eyes:     Extraocular Movements: Extraocular movements intact.     Pupils: Pupils are equal, round, and reactive to light.  Cardiovascular:     Rate and Rhythm: Normal rate and regular rhythm.  Pulmonary:     Comments: Slightly tachypneic and crackles on bilateral bases Abdominal:     General: Abdomen is flat.     Palpations: Abdomen is soft.  Musculoskeletal:        General: Normal range of motion.     Cervical back: Normal range of motion and neck supple.  Skin:    General: Skin is  warm.     Capillary Refill: Capillary refill takes less than 2 seconds.  Neurological:     General: No focal deficit present.     Mental Status: He is alert and oriented to person, place, and time.  Psychiatric:        Mood and Affect: Mood normal.        Behavior: Behavior normal.     ED Results / Procedures / Treatments   Labs (all labs ordered are listed, but only abnormal results are displayed) Labs Reviewed  BASIC METABOLIC PANEL - Abnormal; Notable for the following components:      Result Value   Chloride 93 (*)    BUN 40 (*)    Creatinine, Ser 2.65 (*)    Calcium >15.0 (*)    GFR, Estimated 25 (*)    All other components within normal limits  CBC - Abnormal; Notable for the following components:   Hemoglobin 12.6 (*)    HCT 38.6 (*)    Platelets 147 (*)    All other components within normal limits  CBC WITH DIFFERENTIAL/PLATELET - Abnormal; Notable for the following components:   RBC 4.16 (*)    Hemoglobin 12.2 (*)    HCT 36.3 (*)    Lymphs Abs 0.4 (*)    All other components within normal limits  RESPIRATORY PANEL BY RT PCR (FLU A&B, COVID)  URINALYSIS, ROUTINE W REFLEX MICROSCOPIC  COMPREHENSIVE METABOLIC PANEL  MAGNESIUM  PARATHYROID HORMONE, INTACT (NO CA)  CBG MONITORING, ED    EKG EKG Interpretation  Date/Time:  Friday April 20 2020 16:16:15 EST Ventricular Rate:  102 PR Interval:  168 QRS Duration: 88 QT Interval:  316 QTC Calculation: 411 R Axis:   22 Text Interpretation: Sinus tachycardia Otherwise normal ECG No significant change since last tracing Confirmed by Wandra Arthurs [phone]) on 04/20/2020 6:01:14 PM   Radiology DG Chest Port 1 View  Result Date: 04/20/2020 CLINICAL DATA:  Shortness of breath.  Constipation.  Weakness. EXAM: PORTABLE CHEST 1 VIEW COMPARISON:  06/09/2018 FINDINGS: Altered mediastinal contour compared to the prior exam, I cannot exclude aortic aneurysm, mediastinal mass, or hematoma. CT of the chest (with contrast if  feasible) is recommended for further workup. The lungs appear clear.  Thoracic spondylosis noted. IMPRESSION: 1. Abnormal mediastinal contour, substantially changed from 06/09/2018. I cannot exclude aortic aneurysm, mediastinal mass, or hematoma. CT of the chest (with contrast if feasible) is recommended for further workup. 2. Thoracic spondylosis. Electronically Signed  By: Van Clines M.D.   On: 04/20/2020 18:48    Procedures Procedures (including critical care time)  CRITICAL CARE Performed by: Wandra Arthurs   Total critical care time: 30 minutes  Critical care time was exclusive of separately billable procedures and treating other patients.  Critical care was necessary to treat or prevent imminent or life-threatening deterioration.  Critical care was time spent personally by me on the following activities: development of treatment plan with patient and/or surrogate as well as nursing, discussions with consultants, evaluation of patient's response to treatment, examination of patient, obtaining history from patient or surrogate, ordering and performing treatments and interventions, ordering and review of laboratory studies, ordering and review of radiographic studies, pulse oximetry and re-evaluation of patient's condition.   Medications Ordered in ED Medications  sodium chloride 0.9 % bolus 2,000 mL (2,000 mLs Intravenous New Bag/Given 04/20/20 1833)    ED Course  I have reviewed the triage vital signs and the nursing notes.  Pertinent labs & imaging results that were available during my care of the patient were reviewed by me and considered in my medical decision making (see chart for details).    MDM Rules/Calculators/A&P                         John [name] is a 74 y.o. male presenting with hypercalcemia.  Unclear with the hypercalcemia is from.  Consider myeloma versus other PTH secreting hormones.  No obvious goiter on exam.  Plan to repeat labs and hydrate patient.     10 pm His chest x-ray showed possible dilated aorta versus mass.  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  His repeat labs showed greater than 15 calcium.  Given 2 L normal saline bolus.  PTH sent off.  Hospitalist service to admit    Final Clinical Impression(s) / ED Diagnoses Final diagnoses:  None    Rx / DC Orders ED Discharge Orders    None       Drenda Freeze, MD 04/21/20 [phone]

## 2020-04-20 NOTE — Plan of Care (Signed)
  Problem: Education: Goal: Knowledge of General Education information will improve Description Including pain rating scale, medication(s)/side effects and non-pharmacologic comfort measures Outcome: Progressing   

## 2020-04-21 ENCOUNTER — Encounter (HOSPITAL_COMMUNITY): Payer: Self-pay | Admitting: Internal Medicine

## 2020-04-21 DIAGNOSIS — R54 Age-related physical debility: Secondary | ICD-10-CM | POA: Diagnosis present

## 2020-04-21 DIAGNOSIS — E860 Dehydration: Secondary | ICD-10-CM | POA: Diagnosis present

## 2020-04-21 DIAGNOSIS — E883 Tumor lysis syndrome: Secondary | ICD-10-CM | POA: Diagnosis present

## 2020-04-21 DIAGNOSIS — C8333 Diffuse large B-cell lymphoma, intra-abdominal lymph nodes: Secondary | ICD-10-CM | POA: Diagnosis present

## 2020-04-21 DIAGNOSIS — I10 Essential (primary) hypertension: Secondary | ICD-10-CM | POA: Diagnosis present

## 2020-04-21 DIAGNOSIS — Z8249 Family history of ischemic heart disease and other diseases of the circulatory system: Secondary | ICD-10-CM | POA: Diagnosis not present

## 2020-04-21 DIAGNOSIS — D62 Acute posthemorrhagic anemia: Secondary | ICD-10-CM | POA: Diagnosis not present

## 2020-04-21 DIAGNOSIS — I9581 Postprocedural hypotension: Secondary | ICD-10-CM | POA: Diagnosis not present

## 2020-04-21 DIAGNOSIS — K661 Hemoperitoneum: Secondary | ICD-10-CM | POA: Diagnosis not present

## 2020-04-21 DIAGNOSIS — Z7982 Long term (current) use of aspirin: Secondary | ICD-10-CM | POA: Diagnosis not present

## 2020-04-21 DIAGNOSIS — Z808 Family history of malignant neoplasm of other organs or systems: Secondary | ICD-10-CM | POA: Diagnosis not present

## 2020-04-21 DIAGNOSIS — E790 Hyperuricemia without signs of inflammatory arthritis and tophaceous disease: Secondary | ICD-10-CM | POA: Diagnosis present

## 2020-04-21 DIAGNOSIS — R222 Localized swelling, mass and lump, trunk: Secondary | ICD-10-CM | POA: Diagnosis present

## 2020-04-21 DIAGNOSIS — G4733 Obstructive sleep apnea (adult) (pediatric): Secondary | ICD-10-CM | POA: Diagnosis present

## 2020-04-21 DIAGNOSIS — K5900 Constipation, unspecified: Secondary | ICD-10-CM | POA: Diagnosis present

## 2020-04-21 DIAGNOSIS — D696 Thrombocytopenia, unspecified: Secondary | ICD-10-CM | POA: Diagnosis present

## 2020-04-21 DIAGNOSIS — N179 Acute kidney failure, unspecified: Secondary | ICD-10-CM | POA: Diagnosis not present

## 2020-04-21 DIAGNOSIS — C229 Malignant neoplasm of liver, not specified as primary or secondary: Secondary | ICD-10-CM | POA: Diagnosis present

## 2020-04-21 DIAGNOSIS — R160 Hepatomegaly, not elsewhere classified: Secondary | ICD-10-CM | POA: Diagnosis present

## 2020-04-21 DIAGNOSIS — N400 Enlarged prostate without lower urinary tract symptoms: Secondary | ICD-10-CM | POA: Diagnosis present

## 2020-04-21 DIAGNOSIS — R590 Localized enlarged lymph nodes: Secondary | ICD-10-CM | POA: Diagnosis present

## 2020-04-21 DIAGNOSIS — Z833 Family history of diabetes mellitus: Secondary | ICD-10-CM | POA: Diagnosis not present

## 2020-04-21 DIAGNOSIS — Z20822 Contact with and (suspected) exposure to covid-19: Secondary | ICD-10-CM | POA: Diagnosis present

## 2020-04-21 DIAGNOSIS — J9859 Other diseases of mediastinum, not elsewhere classified: Secondary | ICD-10-CM | POA: Diagnosis present

## 2020-04-21 DIAGNOSIS — C381 Malignant neoplasm of anterior mediastinum: Secondary | ICD-10-CM | POA: Diagnosis present

## 2020-04-21 LAB — COMPREHENSIVE METABOLIC PANEL
ALT: 30 U/L (ref 0–44)
AST: 54 U/L — ABNORMAL HIGH (ref 15–41)
Albumin: 2.9 g/dL — ABNORMAL LOW (ref 3.5–5.0)
Alkaline Phosphatase: 77 U/L (ref 38–126)
Anion gap: 8 (ref 5–15)
BUN: 41 mg/dL — ABNORMAL HIGH (ref 8–23)
CO2: 27 mmol/L (ref 22–32)
Calcium: >15 mg/dL (ref 8.9–10.3)
Chloride: 101 mmol/L (ref 98–111)
Creatinine, Ser: 2.61 mg/dL — ABNORMAL HIGH (ref 0.61–1.24)
GFR, Estimated: 25 mL/min — ABNORMAL LOW (ref >60)
Glucose, Bld: 89 mg/dL (ref 70–99)
Potassium: 3.6 mmol/L (ref 3.5–5.1)
Sodium: 136 mmol/L (ref 135–145)
Total Bilirubin: 1.5 mg/dL — ABNORMAL HIGH (ref 0.3–1.2)
Total Protein: 5.1 g/dL — ABNORMAL LOW (ref 6.5–8.1)

## 2020-04-21 LAB — CBC WITH DIFFERENTIAL/PLATELET
Abs Immature Granulocytes: 0.02 10*3/uL (ref 0.00–0.07)
Basophils Absolute: 0 10*3/uL (ref 0.0–0.1)
Basophils Relative: 1 %
Eosinophils Absolute: 0.1 10*3/uL (ref 0.0–0.5)
Eosinophils Relative: 3 %
HCT: 31.6 % — ABNORMAL LOW (ref 39.0–52.0)
Hemoglobin: 10.9 g/dL — ABNORMAL LOW (ref 13.0–17.0)
Immature Granulocytes: 1 %
Lymphocytes Relative: 7 %
Lymphs Abs: 0.3 10*3/uL — ABNORMAL LOW (ref 0.7–4.0)
MCH: 29.9 pg (ref 26.0–34.0)
MCHC: 34.5 g/dL (ref 30.0–36.0)
MCV: 86.6 fL (ref 80.0–100.0)
Monocytes Absolute: 0.6 10*3/uL (ref 0.1–1.0)
Monocytes Relative: 15 %
Neutro Abs: 3.2 10*3/uL (ref 1.7–7.7)
Neutrophils Relative %: 73 %
Platelets: 126 10*3/uL — ABNORMAL LOW (ref 150–400)
RBC: 3.65 MIL/uL — ABNORMAL LOW (ref 4.22–5.81)
RDW: 13.4 % (ref 11.5–15.5)
WBC: 4.3 10*3/uL (ref 4.0–10.5)
nRBC: 0 % (ref 0.0–0.2)

## 2020-04-21 LAB — FOLATE: Folate: 27.9 ng/mL (ref >5.9)

## 2020-04-21 LAB — IRON AND TIBC
Iron: 59 ug/dL (ref 45–182)
Saturation Ratios: 21 % (ref 17.9–39.5)
TIBC: 286 ug/dL (ref 250–450)
UIBC: 227 ug/dL

## 2020-04-21 LAB — RETICULOCYTES
Immature Retic Fract: 5.3 % (ref 2.3–15.9)
RBC.: 3.75 MIL/uL — ABNORMAL LOW (ref 4.22–5.81)
Retic Count, Absolute: 46.5 10*3/uL (ref 19.0–186.0)
Retic Ct Pct: 1.2 % (ref 0.4–3.1)

## 2020-04-21 LAB — PHOSPHORUS: Phosphorus: 4 mg/dL (ref 2.5–4.6)

## 2020-04-21 LAB — FERRITIN: Ferritin: 1266 ng/mL — ABNORMAL HIGH (ref 24–336)

## 2020-04-21 LAB — LACTATE DEHYDROGENASE: LDH: 416 U/L — ABNORMAL HIGH (ref 98–192)

## 2020-04-21 LAB — VITAMIN B12: Vitamin B-12: 497 pg/mL (ref 180–914)

## 2020-04-21 LAB — HCG, SERUM, QUALITATIVE: Preg, Serum: NEGATIVE

## 2020-04-21 LAB — VITAMIN D 25 HYDROXY (VIT D DEFICIENCY, FRACTURES): Vit D, 25-Hydroxy: 21.57 ng/mL — ABNORMAL LOW (ref 30–100)

## 2020-04-21 MED ORDER — ACETAMINOPHEN 325 MG PO TABS
650.0000 mg | ORAL_TABLET | Freq: Four times a day (QID) | ORAL | Status: DC | PRN
  Administered 2020-04-23 – 2020-04-24 (×4): 650 mg via ORAL
  Filled 2020-04-21 (×4): qty 2

## 2020-04-21 MED ORDER — METOPROLOL TARTRATE 12.5 MG HALF TABLET
12.5000 mg | ORAL_TABLET | Freq: Two times a day (BID) | ORAL | Status: DC
  Administered 2020-04-21 – 2020-04-23 (×6): 12.5 mg via ORAL
  Filled 2020-04-21 (×6): qty 1

## 2020-04-21 MED ORDER — SODIUM CHLORIDE 0.9 % IV SOLN
90.0000 mg | Freq: Once | INTRAVENOUS | Status: AC
  Administered 2020-04-21: 90 mg via INTRAVENOUS
  Filled 2020-04-21: qty 10

## 2020-04-21 MED ORDER — DOXAZOSIN MESYLATE 2 MG PO TABS
4.0000 mg | ORAL_TABLET | Freq: Every day | ORAL | Status: DC
  Administered 2020-04-21 – 2020-04-23 (×3): 4 mg via ORAL
  Filled 2020-04-21 (×3): qty 2

## 2020-04-21 MED ORDER — ACETAMINOPHEN 650 MG RE SUPP: 650.0000 mg | Freq: Four times a day (QID) | RECTAL | Status: DC | PRN

## 2020-04-21 NOTE — Progress Notes (Addendum)
PROGRESS NOTE    John [name]  KZS:[id number] DOB: 1946-02-18 DOA: 04/20/2020 PCP: Christain Sacramento, MD    Brief Narrative:  SAHITH NURSE is a 74 year old male with past medical history notable for essential hypertension, OSA on CPAP who presented to the ED with complaints of weakness and constipation over the past 2-3 weeks.  Was seen by his PCP and given laxatives; which assisted in resolution of his constipation.  Given persistent weakness, he proceeded back to his PCP with lab testing performed that showed critically high calcium levels and was then instructed to come to the emergency department.  Patient did not take any calcium supplementation at home.  His wife also reports that he is been slightly confused over this timeframe as well.  Denies fever/chills/night sweats, no nausea vomiting, shortness of breath or chest pain.  In the ED, creatinine 2.6 which is elevated from January 2020.  Calcium greater than 15, hemoglobin 12.6, platelets 147.  CT chest/abdomen/pelvis without contrast notable for anterior mediastinal mass and right lobe hypodensity.  Patient was started on 2 L NS bolus.  Hospitalist service consulted for admission for further evaluation and treatment.   Assessment & Plan:   Principal Problem:   Hypercalcemia Active Problems:   Essential hypertension   ARF (acute renal failure) (HCC)   Mediastinal mass   Liver mass   Anterior mediastinal mass Right hepatic hypodensity Patient presenting with progressive weakness and constipation over the past 2-3 weeks.  Was noted to have severely elevated calcium level at PCP visit.  CT chest/abdomen notable for large anterior mediastinal mass with right liver lobe hypodensity and extensive mets to [place] Gertner/retroperitoneal adenopathy.  LDH elevated 416. Concern for lymphoma versus thymoma versus germinal cell tumor. --Medical oncology consulted --IR for consideration of biopsy mediastinal vs liver mass --AFP, beta hCG, CEA, CA  19-9: Pending --Await further recommendations from oncology  Severe hypercalcemia Suspect hypercalcemia of malignancy given mediastinal/hepatic mass.  Not on calcium or vitamin D supplements.  Vitamin D 25 hydroxy 21.57 (low). Received 2L NS bolus followed by pamidronate 90 mg IV on 04/21/2020. --Ca >15 --PTH-related peptide pending --PTH-intact: pending --continue NS at 150 mL/hr --Continue close monitoring of calcium level  Acute renal failure Creatinine 2.67 on admission, baseline 0.78 06/2018.  --Cr 2.67>2.61 --Holding home lisinopril  Anemia Thrombocytopenia Hemoglobin 10.9, platelets 126 on admission.  Suspect etiology from malignancy as above.  Iron 59, TIBC 286, ferritin 1266, folate 27.9, B12 497. --SPEP/UPEP --Peripheral smear --continue to monitor CBC daily  Essential hypertension --Holding lisinopril as above for acute renal failure --Doxazosin 4 mg p.o. daily --Metoprolol tartrate 12.5 mg p.o. twice daily  OSA --Continue nocturnal CPAP  DVT prophylaxis: SCDs Code Status: Full code Family Communication: Discussed with spouse present at bedside  Disposition Plan:  Status is: Observation  The patient will require care spanning > 2 midnights and should be moved to inpatient because: Altered mental status, Ongoing diagnostic testing needed not appropriate for outpatient work up, Unsafe d/c plan, IV treatments appropriate due to intensity of illness or inability to take PO and Inpatient level of care appropriate due to severity of illness  Dispo: The patient is from: Home              Anticipated d/c is to: Home              Anticipated d/c date is: > 3 days              Patient currently is not medically  stable to d/c.   Consultants:   Medical oncology, Dr. Burr Medico  Interventional radiology  Procedures:   None  Antimicrobials:   None   Subjective: Patient seen and examined at bedside, resting comfortably lying in bed.  Spouse present at bedside.   Continues with slight confusion, which has been present now for the past 2-3 weeks.  Also complains of some gait disturbance but no falls at home.  Patient and spouse state no significant family history of malignancy other than skin cancer.  They are very worried about the underlying diagnosis.  No other complaints or concerns at this time.  Denies headache, no visual changes, no chest pain, no palpitations, no shortness of breath, no abdominal pain, no paresthesias.  No acute events overnight per nursing staff.  Objective: Vitals:   04/20/20 2130 04/20/20 2303 04/21/20 0316 04/21/20 0949  BP: (!) 152/70 (!) 167/75 (!) 150/70 (!) 127/59  Pulse: 80 91 78 70  Resp: 18 19 18 18   Temp:  98.4 F (36.9 C) 98.1 F (36.7 C) 98 F (36.7 C)  TempSrc:    Oral  SpO2: 98% 99% 98% 96%  Weight:   94.8 kg   Height:        Intake/Output Summary (Last 24 hours) at 04/21/2020 1003 Last data filed at 04/21/2020 0200 Gross per 24 hour  Intake 2711.43 ml  Output 200 ml  Net 2511.43 ml   Filed Weights   04/20/20 1617 04/21/20 0316  Weight: 99.8 kg 94.8 kg    Examination:  General exam: Appears calm and comfortable  Respiratory system: Clear to auscultation. Respiratory effort normal. Cardiovascular system: S1 & S2 heard, RRR. No JVD, murmurs, rubs, gallops or clicks.  Trace lower extremity edema bilaterally to mid shin Gastrointestinal system: Abdomen is nondistended, soft and nontender. No organomegaly or masses felt. Normal bowel sounds heard. Central nervous system: Alert and oriented. No focal neurological deficits. Extremities: Symmetric 5 x 5 power. Skin: No rashes, lesions or ulcers Psychiatry: Judgement and insight appear normal. Mood & affect appropriate.     Data Reviewed: I have personally reviewed following labs and imaging studies  CBC: Recent Labs  Lab 04/20/20 1633 04/20/20 1818 04/21/20 0059  WBC 6.4 5.8 4.3  NEUTROABS  --  4.4 3.2  HGB 12.6* 12.2* 10.9*  HCT 38.6*  36.3* 31.6*  MCV 87.9 87.3 86.6  PLT 147* 150 528*   Basic Metabolic Panel: Recent Labs  Lab 04/20/20 1633 04/20/20 1818 04/21/20 0059  NA 136 130* 136  K 3.9 4.1 3.6  CL 93* 91* 101  CO2 32 28 27  GLUCOSE 94 91 89  BUN 40* 43* 41*  CREATININE 2.65* 2.67* 2.61*  CALCIUM >15.0* >15.0* >15.0*  MG  --  2.0  --   PHOS  --   --  4.0   GFR: Estimated Creatinine Clearance: 30.1 mL/min (A) (by C-G formula based on SCr of 2.61 mg/dL (H)). Liver Function Tests: Recent Labs  Lab 04/20/20 1818 04/21/20 0059  AST 71* 54*  ALT 32 30  ALKPHOS 93 77  BILITOT 1.6* 1.5*  PROT 5.7* 5.1*  ALBUMIN 3.4* 2.9*   No results for input(s): LIPASE, AMYLASE in the last 168 hours. No results for input(s): AMMONIA in the last 168 hours. Coagulation Profile: No results for input(s): INR, PROTIME in the last 168 hours. Cardiac Enzymes: No results for input(s): CKTOTAL, CKMB, CKMBINDEX, TROPONINI in the last 168 hours. BNP (last 3 results) No results for input(s): PROBNP in the last 8760  hours. HbA1C: No results for input(s): HGBA1C in the last 72 hours. CBG: Recent Labs  Lab 04/20/20 1857  GLUCAP 92   Lipid Profile: No results for input(s): CHOL, HDL, LDLCALC, TRIG, CHOLHDL, LDLDIRECT in the last 72 hours. Thyroid Function Tests: No results for input(s): TSH, T4TOTAL, FREET4, T3FREE, THYROIDAB in the last 72 hours. Anemia Panel: Recent Labs    04/21/20 0505  VITAMINB12 497  FOLATE 27.9  FERRITIN 1,266*  TIBC 286  IRON 59  RETICCTPCT 1.2   Sepsis Labs: No results for input(s): PROCALCITON, LATICACIDVEN in the last 168 hours.  Recent Results (from the past 240 hour(s))  Respiratory Panel by RT PCR (Flu A&B, Covid) - Nasopharyngeal Swab     Status: None   Collection Time: 04/20/20  6:25 PM   Specimen: Nasopharyngeal Swab  Result Value Ref Range Status   SARS Coronavirus 2 by RT PCR NEGATIVE NEGATIVE Final    Comment: (NOTE) SARS-CoV-2 target nucleic acids are NOT  DETECTED.  The SARS-CoV-2 RNA is generally detectable in upper respiratoy specimens during the acute phase of infection. The lowest concentration of SARS-CoV-2 viral copies this assay can detect is 131 copies/mL. A negative result does not preclude SARS-Cov-2 infection and should not be used as the sole basis for treatment or other patient management decisions. A negative result may occur with  improper specimen collection/handling, submission of specimen other than nasopharyngeal swab, presence of viral mutation(s) within the areas targeted by this assay, and inadequate number of viral copies (<131 copies/mL). A negative result must be combined with clinical observations, patient history, and epidemiological information. The expected result is Negative.  Fact Sheet for Patients:  PinkCheek.be  Fact Sheet for Healthcare Providers:  GravelBags.it  This test is no t yet approved or cleared by the [place] FDA and  has been authorized for detection and/or diagnosis of SARS-CoV-2 by FDA under an Emergency Use Authorization (EUA). This EUA will remain  in effect (meaning this test can be used) for the duration of the COVID-19 declaration under Section 564(b)(1) of the Act, 21 U.S.C. section 360bbb-3(b)(1), unless the authorization is terminated or revoked sooner.     Influenza A by PCR NEGATIVE NEGATIVE Final   Influenza B by PCR NEGATIVE NEGATIVE Final    Comment: (NOTE) The Xpert Xpress SARS-CoV-2/FLU/RSV assay is intended as an aid in  the diagnosis of influenza from Nasopharyngeal swab specimens and  should not be used as a sole basis for treatment. Nasal washings and  aspirates are unacceptable for Xpert Xpress SARS-CoV-2/FLU/RSV  testing.  Fact Sheet for Patients: PinkCheek.be  Fact Sheet for Healthcare Providers: GravelBags.it  This test is not yet  approved or cleared by the [place] FDA and  has been authorized for detection and/or diagnosis of SARS-CoV-2 by  FDA under an Emergency Use Authorization (EUA). This EUA will remain  in effect (meaning this test can be used) for the duration of the  Covid-19 declaration under Section 564(b)(1) of the Act, 21  U.S.C. section 360bbb-3(b)(1), unless the authorization is  terminated or revoked. Performed at [facility], [place] [address] [place], [place] [address]          Radiology Studies: CT ABDOMEN PELVIS WO CONTRAST  Result Date: 04/20/2020 CLINICAL DATA:  74 year old male with nausea vomiting. Epigastric pain. EXAM: CT CHEST, ABDOMEN AND PELVIS WITHOUT CONTRAST TECHNIQUE: Multidetector CT imaging of the chest, abdomen and pelvis was performed following the standard protocol without IV contrast. COMPARISON:  None. FINDINGS: Evaluation of  this exam is limited in the absence of intravenous contrast. CT CHEST FINDINGS Cardiovascular: There is no cardiomegaly or pericardial effusion. Coronary vascular calcification. There is moderate atherosclerotic calcification of the thoracic aorta. The central pulmonary arteries are grossly unremarkable on this noncontrast CT. Mediastinum/Nodes: Large anterior mediastinal/prevascular space mass/adenopathy abutting the anterior aspect of the aortic arch measuring 6.3 x 4.0 cm in greatest axial dimensions. There is superior extension of the mass into the upper mediastinum. Additional 2.7 x 2.2 cm nodular density in the region of the left hilum may represent portion of the mass or new adenopathy. The esophagus is grossly unremarkable. No mediastinal fluid collection. Lungs/Pleura: The lungs are clear. There is no pleural effusion pneumothorax. The central airways are patent. Musculoskeletal: Degenerative changes of the spine. No acute osseous pathology. CT ABDOMEN PELVIS FINDINGS No intra-abdominal free air or free fluid. Hepatobiliary: There is an 8.6 x  8.2 cm low attenuating mass in the right lobe of the liver which is not characterized on this CT. Further characterization with MRI without and with contrast on a nonemergent/outpatient basis recommended. No intrahepatic biliary ductal dilatation. Probable sludge within the gallbladder. No calcified stone or pericholecystic fluid. Pancreas: No acute inflammation. No pancreatic atrophy or dilatation of the main pancreatic duct Spleen: Indeterminate and ill-defined hypodense lesions within the spleen. There is slight irregularity of the splenic contour. Adrenals/Urinary Tract: Mild thickening of the adrenal glands. There is no hydronephrosis or nephrolithiasis on either side. A 15 mm left renal upper pole hypodense lesion, likely a cyst. The visualized ureters and urinary bladder appear unremarkable. Stomach/Bowel: There is sigmoid diverticulosis without active inflammatory changes. There is no bowel obstruction or active inflammation. The appendix is normal. Vascular/Lymphatic: Advanced aortoiliac atherosclerotic disease. The IVC is unremarkable. No portal venous gas. Extensive mesenteric and retroperitoneal adenopathy. There is a 6.3 x 6.9 cm soft tissue mass in the mesentery abutting the inferior aspect of the uncinate process of the pancreas. Additional soft tissue mass/adenopathy medial to the ascending colon. Reproductive: Mild prostate enlargement. Other: None Musculoskeletal: Degenerative changes of the spine. 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 No acute osseous pathology. IMPRESSION: 1. Large anterior mediastinal/prevascular space mass/adenopathy. The mass extends superiorly and abuts the upper trachea. 2. Large hypodense lesion in the right lobe of the liver as well as ill-defined splenic hypodense lesions. Further characterization with MRI without and with contrast recommended. The liver lesion is amenable to percutaneous tissue sampling. 3. Extensive mesenteric and retroperitoneal  adenopathy. 4. Sigmoid diverticulosis. No bowel obstruction. Normal appendix. 5. Aortic Atherosclerosis (ICD10-I70.0). Electronically Signed   By: Anner Crete M.D.   On: 04/20/2020 21:11   CT Chest Wo Contrast  Result Date: 04/20/2020 CLINICAL DATA:  74 year old male with nausea vomiting. Epigastric pain. EXAM: CT CHEST, ABDOMEN AND PELVIS WITHOUT CONTRAST TECHNIQUE: Multidetector CT imaging of the chest, abdomen and pelvis was performed following the standard protocol without IV contrast. COMPARISON:  None. FINDINGS: Evaluation of this exam is limited in the absence of intravenous contrast. CT CHEST FINDINGS Cardiovascular: There is no cardiomegaly or pericardial effusion. Coronary vascular calcification. There is moderate atherosclerotic calcification of the thoracic aorta. The central pulmonary arteries are grossly unremarkable on this noncontrast CT. Mediastinum/Nodes: Large anterior mediastinal/prevascular space mass/adenopathy abutting the anterior aspect of the aortic arch measuring 6.3 x 4.0 cm in greatest axial dimensions. There is superior extension of the mass into the upper mediastinum. Additional 2.7 x 2.2 cm nodular density in the region of the left hilum may represent portion  of the mass or new adenopathy. The esophagus is grossly unremarkable. No mediastinal fluid collection. Lungs/Pleura: The lungs are clear. There is no pleural effusion pneumothorax. The central airways are patent. Musculoskeletal: Degenerative changes of the spine. No acute osseous pathology. CT ABDOMEN PELVIS FINDINGS No intra-abdominal free air or free fluid. Hepatobiliary: There is an 8.6 x 8.2 cm low attenuating mass in the right lobe of the liver which is not characterized on this CT. Further characterization with MRI without and with contrast on a nonemergent/outpatient basis recommended. No intrahepatic biliary ductal dilatation. Probable sludge within the gallbladder. No calcified stone or pericholecystic fluid.  Pancreas: No acute inflammation. No pancreatic atrophy or dilatation of the main pancreatic duct Spleen: Indeterminate and ill-defined hypodense lesions within the spleen. There is slight irregularity of the splenic contour. Adrenals/Urinary Tract: Mild thickening of the adrenal glands. There is no hydronephrosis or nephrolithiasis on either side. A 15 mm left renal upper pole hypodense lesion, likely a cyst. The visualized ureters and urinary bladder appear unremarkable. Stomach/Bowel: There is sigmoid diverticulosis without active inflammatory changes. There is no bowel obstruction or active inflammation. The appendix is normal. Vascular/Lymphatic: Advanced aortoiliac atherosclerotic disease. The IVC is unremarkable. No portal venous gas. Extensive mesenteric and retroperitoneal adenopathy. There is a 6.3 x 6.9 cm soft tissue mass in the mesentery abutting the inferior aspect of the uncinate process of the pancreas. Additional soft tissue mass/adenopathy medial to the ascending colon. Reproductive: Mild prostate enlargement. Other: None Musculoskeletal: Degenerative changes of the spine. 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 No acute osseous pathology. IMPRESSION: 1. Large anterior mediastinal/prevascular space mass/adenopathy. The mass extends superiorly and abuts the upper trachea. 2. Large hypodense lesion in the right lobe of the liver as well as ill-defined splenic hypodense lesions. Further characterization with MRI without and with contrast recommended. The liver lesion is amenable to percutaneous tissue sampling. 3. Extensive mesenteric and retroperitoneal adenopathy. 4. Sigmoid diverticulosis. No bowel obstruction. Normal appendix. 5. Aortic Atherosclerosis (ICD10-I70.0). Electronically Signed   By: Anner Crete M.D.   On: 04/20/2020 21:11   DG Chest Port 1 View  Result Date: 04/20/2020 CLINICAL DATA:  Shortness of breath.  Constipation.  Weakness. EXAM: PORTABLE CHEST 1  VIEW COMPARISON:  06/09/2018 FINDINGS: Altered mediastinal contour compared to the prior exam, I cannot exclude aortic aneurysm, mediastinal mass, or hematoma. CT of the chest (with contrast if feasible) is recommended for further workup. The lungs appear clear.  Thoracic spondylosis noted. IMPRESSION: 1. Abnormal mediastinal contour, substantially changed from 06/09/2018. I cannot exclude aortic aneurysm, mediastinal mass, or hematoma. CT of the chest (with contrast if feasible) is recommended for further workup. 2. Thoracic spondylosis. Electronically Signed   By: Van Clines M.D.   On: 04/20/2020 18:48        Scheduled Meds: . doxazosin  4 mg Oral Daily  . metoprolol tartrate  12.5 mg Oral BID   Continuous Infusions: . sodium chloride 150 mL/hr at 04/20/20 2147     LOS: 0 days    Time spent: 39 minutes spent on chart review, discussion with nursing staff, consultants, updating family and interview/physical exam; more than 50% of that time was spent in counseling and/or coordination of care.    Jaxsyn Catalfamo J [place] DO [facility] Available via Epic secure chat 7am-7pm After these hours, please refer to coverage provider listed on [email or web address] 04/21/2020, 10:03 AM

## 2020-04-21 NOTE — H&P (Addendum)
History and Physical    John [name] GBT:[id number] DOB: 06-09-1946 DOA: 04/20/2020  PCP: Christain Sacramento, MD  Patient coming from: Home.  Chief Complaint: Abnormal labs.  HPI: John [name] is a 74 y.o. male with history of hypertension has been experiencing weakness and constipation over the last 2 to 3 weeks.  Had gone to his primary care physician had laxatives which ended him move his bowels.  Since he had persistent weakness he went back again and had lab work was done which showed critically high calcium levels and was instructed to come to the ER.  Patient does take calcium tablets and supplements.  Denies any nausea vomiting chest pain or shortness of breath.  Has generalized weakness and fatigue.  ED Course: In the ER lab work was significant for creatinine of 2.6 which is showing acute renal failure last creatinine 2020 January was normal.  With a calcium of more than 15 and the hemoglobin was 12.6 with platelets of 147 which is also new.  CT chest and abdomen done without contrast was showing mediastinal mass and liver mass.  Which will need further work-up.  Patient was started on 2 L fluid bolus for the hypercalcemia admitted for further work-up.  EKG shows sinus tachycardia with QTC of 411 ms QRS of 88 ms.  Review of Systems: As per HPI, rest all negative.   Past Medical History:  Diagnosis Date  . Hypertension   . Knee pain, right     Past Surgical History:  Procedure Laterality Date  . FOOT SURGERY    . TONSILLECTOMY       reports that he has never smoked. He has never used smokeless tobacco. He reports that he does not drink alcohol and does not use drugs.  No Known Allergies  Family History  Problem Relation Age of Onset  . Diabetes Mellitus II Brother   . Skin cancer Mother     Prior to Admission medications   Medication Sig Start Date End Date Taking? Authorizing Provider  Ascorbic Acid (VITAMIN C WITH ROSE HIPS) 1000 MG tablet Take 1,000 mg by  mouth daily.    [provider]  aspirin EC 81 MG tablet Take 81 mg by mouth daily.    [provider]  benazepril (LOTENSIN) 10 MG tablet Take 10 mg by mouth daily.    [provider]  Calcium Citrate-Vitamin D (CALCIUM CITRATE + D PO) Take 1 tablet by mouth daily.    [provider]  clobetasol ointment (TEMOVATE) 7.10 % Apply 1 application topically 2 (two) times daily.    [provider]  doxazosin (CARDURA) 4 MG tablet Take 4 mg by mouth daily.    [provider]  metoprolol tartrate (LOPRESSOR) 25 MG tablet Take 0.5 tablets (12.5 mg total) by mouth 2 (two) times daily. 06/09/18   Lajean Saver, MD  Multiple Vitamins-Minerals (MULTIVITAMIN PO) Take 1 tablet by mouth daily.    [provider]  Omega-3 Fatty Acids (OMEGA 3 PO) Take 1 tablet by mouth daily.    [provider]  Specialty Vitamins Products (PROSTATE PO) Take 2 tablets by mouth daily.    [provider]    Physical Exam: Constitutional: Moderately built and nourished. Vitals:   04/20/20 1915 04/20/20 2000 04/20/20 2130 04/20/20 2303  BP:  (!) 143/71 (!) 152/70 (!) 167/75  Pulse: 79 83 80 91  Resp: 17 18 18 19   Temp:    98.4 F (36.9 C)  TempSrc:  SpO2: 96% 95% 98% 99%  Weight:      Height:       Eyes: Anicteric no pallor. ENMT: No discharge from the ears eyes nose or mouth. Neck: No mass felt.  No neck rigidity.  No JVD appreciated. Respiratory: No rhonchi or crepitations. Cardiovascular: S1-S2 heard. Abdomen: Soft nontender bowel sounds present. Musculoskeletal: No edema. Skin: No rash. Neurologic: Alert awake oriented to time place and person.  Moves all extremities. Psychiatric: Appears normal.  Normal affect.   Labs on Admission: I have personally reviewed following labs and imaging studies  CBC: Recent Labs  Lab 04/20/20 1633 04/20/20 1818  WBC 6.4 5.8  NEUTROABS  --  4.4  HGB 12.6* 12.2*  HCT 38.6* 36.3*  MCV 87.9  87.3  PLT 147* 852   Basic Metabolic Panel: Recent Labs  Lab 04/20/20 1633 04/20/20 1818  NA 136 130*  K 3.9 4.1  CL 93* 91*  CO2 32 28  GLUCOSE 94 91  BUN 40* 43*  CREATININE 2.65* 2.67*  CALCIUM >15.0* >15.0*  MG  --  2.0   GFR: Estimated Creatinine Clearance: 30.1 mL/min (A) (by C-G formula based on SCr of 2.67 mg/dL (H)). Liver Function Tests: Recent Labs  Lab 04/20/20 1818  AST 71*  ALT 32  ALKPHOS 93  BILITOT 1.6*  PROT 5.7*  ALBUMIN 3.4*   No results for input(s): LIPASE, AMYLASE in the last 168 hours. No results for input(s): AMMONIA in the last 168 hours. Coagulation Profile: No results for input(s): INR, PROTIME in the last 168 hours. Cardiac Enzymes: No results for input(s): CKTOTAL, CKMB, CKMBINDEX, TROPONINI in the last 168 hours. BNP (last 3 results) No results for input(s): PROBNP in the last 8760 hours. HbA1C: No results for input(s): HGBA1C in the last 72 hours. CBG: Recent Labs  Lab 04/20/20 1857  GLUCAP 92   Lipid Profile: No results for input(s): CHOL, HDL, LDLCALC, TRIG, CHOLHDL, LDLDIRECT in the last 72 hours. Thyroid Function Tests: No results for input(s): TSH, T4TOTAL, FREET4, T3FREE, THYROIDAB in the last 72 hours. Anemia Panel: No results for input(s): VITAMINB12, FOLATE, FERRITIN, TIBC, IRON, RETICCTPCT in the last 72 hours. Urine analysis:    Component Value Date/Time   COLORURINE YELLOW 04/20/2020 1916   APPEARANCEUR CLEAR 04/20/2020 1916   LABSPEC 1.010 04/20/2020 1916   PHURINE 6.0 04/20/2020 1916   GLUCOSEU NEGATIVE 04/20/2020 1916   HGBUR NEGATIVE 04/20/2020 1916   BILIRUBINUR NEGATIVE 04/20/2020 1916   KETONESUR NEGATIVE 04/20/2020 1916   PROTEINUR NEGATIVE 04/20/2020 1916   UROBILINOGEN 0.2 01/24/2012 0709   NITRITE NEGATIVE 04/20/2020 1916   LEUKOCYTESUR NEGATIVE 04/20/2020 1916   Sepsis Labs: @LABRCNTIP (procalcitonin:4,lacticidven:4) ) Recent Results (from the past 240 hour(s))  Respiratory Panel by RT PCR  (Flu A&B, Covid) - Nasopharyngeal Swab     Status: None   Collection Time: 04/20/20  6:25 PM   Specimen: Nasopharyngeal Swab  Result Value Ref Range Status   SARS Coronavirus 2 by RT PCR NEGATIVE NEGATIVE Final    Comment: (NOTE) SARS-CoV-2 target nucleic acids are NOT DETECTED.  The SARS-CoV-2 RNA is generally detectable in upper respiratoy specimens during the acute phase of infection. The lowest concentration of SARS-CoV-2 viral copies this assay can detect is 131 copies/mL. A negative result does not preclude SARS-Cov-2 infection and should not be used as the sole basis for treatment or other patient management decisions. A negative result may occur with  improper specimen collection/handling, submission of specimen other than nasopharyngeal swab, presence of viral  mutation(s) within the areas targeted by this assay, and inadequate number of viral copies (<131 copies/mL). A negative result must be combined with clinical observations, patient history, and epidemiological information. The expected result is Negative.  Fact Sheet for Patients:  PinkCheek.be  Fact Sheet for Healthcare Providers:  GravelBags.it  This test is no t yet approved or cleared by the [place] FDA and  has been authorized for detection and/or diagnosis of SARS-CoV-2 by FDA under an Emergency Use Authorization (EUA). This EUA will remain  in effect (meaning this test can be used) for the duration of the COVID-19 declaration under Section 564(b)(1) of the Act, 21 U.S.C. section 360bbb-3(b)(1), unless the authorization is terminated or revoked sooner.     Influenza A by PCR NEGATIVE NEGATIVE Final   Influenza B by PCR NEGATIVE NEGATIVE Final    Comment: (NOTE) The Xpert Xpress SARS-CoV-2/FLU/RSV assay is intended as an aid in  the diagnosis of influenza from Nasopharyngeal swab specimens and  should not be used as a sole basis for treatment.  Nasal washings and  aspirates are unacceptable for Xpert Xpress SARS-CoV-2/FLU/RSV  testing.  Fact Sheet for Patients: PinkCheek.be  Fact Sheet for Healthcare Providers: GravelBags.it  This test is not yet approved or cleared by the [place] FDA and  has been authorized for detection and/or diagnosis of SARS-CoV-2 by  FDA under an Emergency Use Authorization (EUA). This EUA will remain  in effect (meaning this test can be used) for the duration of the  Covid-19 declaration under Section 564(b)(1) of the Act, 21  U.S.C. section 360bbb-3(b)(1), unless the authorization is  terminated or revoked. Performed at [facility], [place] [address] [place], [place] [address]      Radiological Exams on Admission: CT ABDOMEN PELVIS WO CONTRAST  Result Date: 04/20/2020 CLINICAL DATA:  74 year old male with nausea vomiting. Epigastric pain. EXAM: CT CHEST, ABDOMEN AND PELVIS WITHOUT CONTRAST TECHNIQUE: Multidetector CT imaging of the chest, abdomen and pelvis was performed following the standard protocol without IV contrast. COMPARISON:  None. FINDINGS: Evaluation of this exam is limited in the absence of intravenous contrast. CT CHEST FINDINGS Cardiovascular: There is no cardiomegaly or pericardial effusion. Coronary vascular calcification. There is moderate atherosclerotic calcification of the thoracic aorta. The central pulmonary arteries are grossly unremarkable on this noncontrast CT. Mediastinum/Nodes: Large anterior mediastinal/prevascular space mass/adenopathy abutting the anterior aspect of the aortic arch measuring 6.3 x 4.0 cm in greatest axial dimensions. There is superior extension of the mass into the upper mediastinum. Additional 2.7 x 2.2 cm nodular density in the region of the left hilum may represent portion of the mass or new adenopathy. The esophagus is grossly unremarkable. No mediastinal fluid collection. Lungs/Pleura:  The lungs are clear. There is no pleural effusion pneumothorax. The central airways are patent. Musculoskeletal: Degenerative changes of the spine. No acute osseous pathology. CT ABDOMEN PELVIS FINDINGS No intra-abdominal free air or free fluid. Hepatobiliary: There is an 8.6 x 8.2 cm low attenuating mass in the right lobe of the liver which is not characterized on this CT. Further characterization with MRI without and with contrast on a nonemergent/outpatient basis recommended. No intrahepatic biliary ductal dilatation. Probable sludge within the gallbladder. No calcified stone or pericholecystic fluid. Pancreas: No acute inflammation. No pancreatic atrophy or dilatation of the main pancreatic duct Spleen: Indeterminate and ill-defined hypodense lesions within the spleen. There is slight irregularity of the splenic contour. Adrenals/Urinary Tract: Mild thickening of the adrenal glands. There is no hydronephrosis or  nephrolithiasis on either side. A 15 mm left renal upper pole hypodense lesion, likely a cyst. The visualized ureters and urinary bladder appear unremarkable. Stomach/Bowel: There is sigmoid diverticulosis without active inflammatory changes. There is no bowel obstruction or active inflammation. The appendix is normal. Vascular/Lymphatic: Advanced aortoiliac atherosclerotic disease. The IVC is unremarkable. No portal venous gas. Extensive mesenteric and retroperitoneal adenopathy. There is a 6.3 x 6.9 cm soft tissue mass in the mesentery abutting the inferior aspect of the uncinate process of the pancreas. Additional soft tissue mass/adenopathy medial to the ascending colon. Reproductive: Mild prostate enlargement. Other: None Musculoskeletal: Degenerative changes of the spine. 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 No acute osseous pathology. IMPRESSION: 1. Large anterior mediastinal/prevascular space mass/adenopathy. The mass extends superiorly and abuts the upper trachea. 2.  Large hypodense lesion in the right lobe of the liver as well as ill-defined splenic hypodense lesions. Further characterization with MRI without and with contrast recommended. The liver lesion is amenable to percutaneous tissue sampling. 3. Extensive mesenteric and retroperitoneal adenopathy. 4. Sigmoid diverticulosis. No bowel obstruction. Normal appendix. 5. Aortic Atherosclerosis (ICD10-I70.0). Electronically Signed   By: Anner Crete M.D.   On: 04/20/2020 21:11   CT Chest Wo Contrast  Result Date: 04/20/2020 CLINICAL DATA:  74 year old male with nausea vomiting. Epigastric pain. EXAM: CT CHEST, ABDOMEN AND PELVIS WITHOUT CONTRAST TECHNIQUE: Multidetector CT imaging of the chest, abdomen and pelvis was performed following the standard protocol without IV contrast. COMPARISON:  None. FINDINGS: Evaluation of this exam is limited in the absence of intravenous contrast. CT CHEST FINDINGS Cardiovascular: There is no cardiomegaly or pericardial effusion. Coronary vascular calcification. There is moderate atherosclerotic calcification of the thoracic aorta. The central pulmonary arteries are grossly unremarkable on this noncontrast CT. Mediastinum/Nodes: Large anterior mediastinal/prevascular space mass/adenopathy abutting the anterior aspect of the aortic arch measuring 6.3 x 4.0 cm in greatest axial dimensions. There is superior extension of the mass into the upper mediastinum. Additional 2.7 x 2.2 cm nodular density in the region of the left hilum may represent portion of the mass or new adenopathy. The esophagus is grossly unremarkable. No mediastinal fluid collection. Lungs/Pleura: The lungs are clear. There is no pleural effusion pneumothorax. The central airways are patent. Musculoskeletal: Degenerative changes of the spine. No acute osseous pathology. CT ABDOMEN PELVIS FINDINGS No intra-abdominal free air or free fluid. Hepatobiliary: There is an 8.6 x 8.2 cm low attenuating mass in the right lobe of  the liver which is not characterized on this CT. Further characterization with MRI without and with contrast on a nonemergent/outpatient basis recommended. No intrahepatic biliary ductal dilatation. Probable sludge within the gallbladder. No calcified stone or pericholecystic fluid. Pancreas: No acute inflammation. No pancreatic atrophy or dilatation of the main pancreatic duct Spleen: Indeterminate and ill-defined hypodense lesions within the spleen. There is slight irregularity of the splenic contour. Adrenals/Urinary Tract: Mild thickening of the adrenal glands. There is no hydronephrosis or nephrolithiasis on either side. A 15 mm left renal upper pole hypodense lesion, likely a cyst. The visualized ureters and urinary bladder appear unremarkable. Stomach/Bowel: There is sigmoid diverticulosis without active inflammatory changes. There is no bowel obstruction or active inflammation. The appendix is normal. Vascular/Lymphatic: Advanced aortoiliac atherosclerotic disease. The IVC is unremarkable. No portal venous gas. Extensive mesenteric and retroperitoneal adenopathy. There is a 6.3 x 6.9 cm soft tissue mass in the mesentery abutting the inferior aspect of the uncinate process of the pancreas. Additional soft tissue mass/adenopathy medial to the ascending  colon. Reproductive: Mild prostate enlargement. Other: None Musculoskeletal: Degenerative changes of the spine. 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 No acute osseous pathology. IMPRESSION: 1. Large anterior mediastinal/prevascular space mass/adenopathy. The mass extends superiorly and abuts the upper trachea. 2. Large hypodense lesion in the right lobe of the liver as well as ill-defined splenic hypodense lesions. Further characterization with MRI without and with contrast recommended. The liver lesion is amenable to percutaneous tissue sampling. 3. Extensive mesenteric and retroperitoneal adenopathy. 4. Sigmoid diverticulosis. No bowel  obstruction. Normal appendix. 5. Aortic Atherosclerosis (ICD10-I70.0). Electronically Signed   By: Anner Crete M.D.   On: 04/20/2020 21:11   DG Chest Port 1 View  Result Date: 04/20/2020 CLINICAL DATA:  Shortness of breath.  Constipation.  Weakness. EXAM: PORTABLE CHEST 1 VIEW COMPARISON:  06/09/2018 FINDINGS: Altered mediastinal contour compared to the prior exam, I cannot exclude aortic aneurysm, mediastinal mass, or hematoma. CT of the chest (with contrast if feasible) is recommended for further workup. The lungs appear clear.  Thoracic spondylosis noted. IMPRESSION: 1. Abnormal mediastinal contour, substantially changed from 06/09/2018. I cannot exclude aortic aneurysm, mediastinal mass, or hematoma. CT of the chest (with contrast if feasible) is recommended for further workup. 2. Thoracic spondylosis. Electronically Signed   By: Van Clines M.D.   On: 04/20/2020 18:48    EKG: Independently reviewed.  Sinus tachycardia with QTC of 411 ms QRS of 88 ms.  Assessment/Plan Principal Problem:   Hypercalcemia Active Problems:   Essential hypertension   ARF (acute renal failure) (HCC)   Mediastinal mass   Liver mass    1. Severe hypercalcemia likely from lesion seen in the mediastinum and liver.  For now I would continue with hydration with normal saline and I have discussed with pharmacy and also discussed about the renal failure pharmacist as the dose pamidronate for now.  Closely follow metabolic panel.  Follow in telemetry.  In addition will check PTH related peptide levels, parathormone levels vitamin D and phosphorus levels.  Hold any calcium supplements or vitamin D supplements. 2. 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  Discussed with pulmonologist advised to get CT surgery consult.  The liver lesion is amenable to percutaneous biopsy.  Will need oncology input. 3. Anemia and thrombocytopenia appears to be new.  Follow CBC closely.  Will check anemia panel with next  blood draw.  Check SPEP UPEP.  Will probably need oncology input.  Will check peripheral smear study and also LDH to make sure there is no hemolytic process and also to check schistocytes. 4. Acute renal failure likely from dehydration and severe hypercalcemia.  Continue with hydration follow metabolic panel.  Hold lisinopril.  Check SPEP and UPEP. 5. Hypertension holding lisinopril due to renal failure.  Continue metoprolol Cardura and we will keep patient on as needed IV hydralazine.   DVT prophylaxis: SCDs.  Anticipation of possible biopsy needed will hold off anticoagulation. Code Status: Full code. Family Communication: Patient's wife at the bedside. Disposition Plan: Home. Consults called: Discussed with pulmonologist. Admission status: Observation.   Rise Patience MD [facility] Pager [phone].  If 7PM-7AM, please contact night-coverage [email or web address] Password [email or web address]  04/21/2020, 12:37 AM

## 2020-04-21 NOTE — Consult Note (Signed)
[place]  [phone]   HEMATOLOGY ONCOLOGY INPATIENT CONSULTATION   John [name]  DOB: December 17, 1945  MR#: [id number]  CSN#: [id number]    Requesting Physician: [facility]  Patient Care Team: Christain Sacramento, MD as PCP - General (Family Medicine)  Reason for consult: hypercalcemia,liver and mediastinal mass  History of present illness:   John [name] is a 74 years old Caucasian male, with past medical history of hypertension, BPH, was admitted yesterday for severe hypercalcemia, CT scan was highly suspicious for malignancy.  He presents with 2 weeks of fatigue, and constipation.  He was seen by primary care physician, constipation resolved with laxative, but he presented with worsening fatigue, and a lab at the primary care physician's office showed severe hypocalcemia.  He was sent to ED for further evaluation.  CT scan showed a large mediastinal, and a liver mass, and diffuse retroperitoneal lymphadenopathy.  He was admitted for further evaluation and management.  He received IV fluids, pamidronate, repeated calcium level still above 15 last night.  I met patient and his wife at bedside.  He denies any significant pain, abdominal bloating, dyspnea or cough.  He noticed his appetite has been slightly low, he lost about 10 pounds in the past month.  His wife also noticed mild confusion, which has improved.  His appetite is better today with IV fluids and the medicine.  No fever, night sweats.  He denies any history of malignancy, has been fairly healthy in the past.  He never smoked or drinks alcohol.  MEDICAL HISTORY:  Past Medical History:  Diagnosis Date  . Hypertension   . Knee pain, right     SURGICAL HISTORY: Past Surgical History:  Procedure Laterality Date  . FOOT SURGERY    . TONSILLECTOMY      SOCIAL HISTORY: Social History   Socioeconomic History  . Marital status: Married    Spouse name: Not on file  . Number of children: Not on  file  . Years of education: Not on file  . Highest education level: Not on file  Occupational History  . Not on file  Tobacco Use  . Smoking status: Never Smoker  . Smokeless tobacco: Never Used  Vaping Use  . Vaping Use: Never used  Substance and Sexual Activity  . Alcohol use: No  . Drug use: No  . Sexual activity: Not on file  Other Topics Concern  . Not on file  Social History Narrative  . Not on file   Social Determinants of Health   Financial Resource Strain:   . Difficulty of Paying Living Expenses: Not on file  Food Insecurity:   . Worried About Charity fundraiser in the Last Year: Not on file  . Ran Out of Food in the Last Year: Not on file  Transportation Needs:   . Lack of Transportation (Medical): Not on file  . Lack of Transportation (Non-Medical): Not on file  Physical Activity:   . Days of Exercise per Week: Not on file  . Minutes of Exercise per Session: Not on file  Stress:   . Feeling of Stress : Not on file  Social Connections:   . Frequency of Communication with Friends and Family: Not on file  . Frequency of Social Gatherings with Friends and Family: Not on file  . Attends Religious Services: Not on file  . Active Member of Clubs or Organizations: Not on file  . Attends Archivist Meetings: Not on file  .  Marital Status: Not on file  Intimate Partner Violence:   . Fear of Current or Ex-Partner: Not on file  . Emotionally Abused: Not on file  . Physically Abused: Not on file  . Sexually Abused: Not on file    FAMILY HISTORY: Family History  Problem Relation Age of Onset  . Diabetes Mellitus II Brother   . Skin cancer Mother     ALLERGIES:  has No Known Allergies.  MEDICATIONS:  Current Facility-Administered Medications  Medication Dose Route Frequency Provider Last Rate Last Admin  . 0.9 %  sodium chloride infusion   Intravenous Continuous [place] Eric J, DO 150 mL/hr at 04/21/20 1027 New Bag at 04/21/20 1027  .  acetaminophen (TYLENOL) tablet 650 mg  650 mg Oral Q6H PRN Rise Patience, MD       Or  . acetaminophen (TYLENOL) suppository 650 mg  650 mg Rectal Q6H PRN Rise Patience, MD      . doxazosin (CARDURA) tablet 4 mg  4 mg Oral Daily Rise Patience, MD   4 mg at 04/21/20 1020  . metoprolol tartrate (LOPRESSOR) tablet 12.5 mg  12.5 mg Oral BID Rise Patience, MD   12.5 mg at 04/21/20 1020    REVIEW OF SYSTEMS:   Constitutional: Denies fevers, chills or abnormal night sweats, (+) fatigue and 10 pound weight loss Eyes: Denies blurriness of vision, double vision or watery eyes Ears, nose, mouth, throat, and face: Denies mucositis or sore throat Respiratory: Denies cough, dyspnea or wheezes Cardiovascular: Denies palpitation, chest discomfort or lower extremity swelling Gastrointestinal:  Denies nausea, heartburn, (+) constipation Skin: Denies abnormal skin rashes Lymphatics: Denies new lymphadenopathy or easy bruising Neurological:Denies numbness, tingling or new weaknesses Behavioral/Psych: Mood is stable, no new changes  All other systems were reviewed with the patient and are negative.  PHYSICAL EXAMINATION: ECOG PERFORMANCE STATUS: 1 - Symptomatic but completely ambulatory  Vitals:   04/21/20 0316 04/21/20 0949  BP: (!) 150/70 (!) 127/59  Pulse: 78 70  Resp: 18 18  Temp: 98.1 F (36.7 C) 98 F (36.7 C)  SpO2: 98% 96%   Filed Weights   04/20/20 1617 04/21/20 0316  Weight: 220 lb (99.8 kg) 208 lb 15.9 oz (94.8 kg)    GENERAL:alert, no distress and comfortable SKIN: skin color, texture, turgor are normal, no rashes or significant lesions EYES: normal, conjunctiva are pink and non-injected, sclera clear OROPHARYNX:no exudate, no erythema and lips, buccal mucosa, and tongue normal  NECK: supple, thyroid normal size, non-tender, without nodularity LYMPH:  no palpable lymphadenopathy in the cervical, axillary or inguinal LUNGS: clear to auscultation and  percussion with normal breathing effort HEART: regular rate & rhythm and no murmurs and no lower extremity edema ABDOMEN:abdomen soft, non-tender and normal bowel sounds Musculoskeletal:no cyanosis of digits and no clubbing  PSYCH: alert & oriented x 3 with fluent speech NEURO: no focal motor/sensory deficits  LABORATORY DATA:  I have reviewed the data as listed Lab Results  Component Value Date   WBC 4.3 04/21/2020   HGB 10.9 (L) 04/21/2020   HCT 31.6 (L) 04/21/2020   MCV 86.6 04/21/2020   PLT 126 (L) 04/21/2020   Recent Labs    04/20/20 1633 04/20/20 1818 04/21/20 0059  NA 136 130* 136  K 3.9 4.1 3.6  CL 93* 91* 101  CO2 32 28 27  GLUCOSE 94 91 89  BUN 40* 43* 41*  CREATININE 2.65* 2.67* 2.61*  CALCIUM >15.0* >15.0* >15.0*  GFRNONAA 25* 25* 25*  PROT  --  5.7* 5.1*  ALBUMIN  --  3.4* 2.9*  AST  --  71* 54*  ALT  --  32 30  ALKPHOS  --  93 77  BILITOT  --  1.6* 1.5*    RADIOGRAPHIC STUDIES: I have personally reviewed the radiological images as listed and agreed with the findings in the report. CT ABDOMEN PELVIS WO CONTRAST  Result Date: 04/20/2020 CLINICAL DATA:  74 year old male with nausea vomiting. Epigastric pain. EXAM: CT CHEST, ABDOMEN AND PELVIS WITHOUT CONTRAST TECHNIQUE: Multidetector CT imaging of the chest, abdomen and pelvis was performed following the standard protocol without IV contrast. COMPARISON:  None. FINDINGS: Evaluation of this exam is limited in the absence of intravenous contrast. CT CHEST FINDINGS Cardiovascular: There is no cardiomegaly or pericardial effusion. Coronary vascular calcification. There is moderate atherosclerotic calcification of the thoracic aorta. The central pulmonary arteries are grossly unremarkable on this noncontrast CT. Mediastinum/Nodes: Large anterior mediastinal/prevascular space mass/adenopathy abutting the anterior aspect of the aortic arch measuring 6.3 x 4.0 cm in greatest axial dimensions. There is superior extension  of the mass into the upper mediastinum. Additional 2.7 x 2.2 cm nodular density in the region of the left hilum may represent portion of the mass or new adenopathy. The esophagus is grossly unremarkable. No mediastinal fluid collection. Lungs/Pleura: The lungs are clear. There is no pleural effusion pneumothorax. The central airways are patent. Musculoskeletal: Degenerative changes of the spine. No acute osseous pathology. CT ABDOMEN PELVIS FINDINGS No intra-abdominal free air or free fluid. Hepatobiliary: There is an 8.6 x 8.2 cm low attenuating mass in the right lobe of the liver which is not characterized on this CT. Further characterization with MRI without and with contrast on a nonemergent/outpatient basis recommended. No intrahepatic biliary ductal dilatation. Probable sludge within the gallbladder. No calcified stone or pericholecystic fluid. Pancreas: No acute inflammation. No pancreatic atrophy or dilatation of the main pancreatic duct Spleen: Indeterminate and ill-defined hypodense lesions within the spleen. There is slight irregularity of the splenic contour. Adrenals/Urinary Tract: Mild thickening of the adrenal glands. There is no hydronephrosis or nephrolithiasis on either side. A 15 mm left renal upper pole hypodense lesion, likely a cyst. The visualized ureters and urinary bladder appear unremarkable. Stomach/Bowel: There is sigmoid diverticulosis without active inflammatory changes. There is no bowel obstruction or active inflammation. The appendix is normal. Vascular/Lymphatic: Advanced aortoiliac atherosclerotic disease. The IVC is unremarkable. No portal venous gas. Extensive mesenteric and retroperitoneal adenopathy. There is a 6.3 x 6.9 cm soft tissue mass in the mesentery abutting the inferior aspect of the uncinate process of the pancreas. Additional soft tissue mass/adenopathy medial to the ascending colon. Reproductive: Mild prostate enlargement. Other: None Musculoskeletal: Degenerative  changes of the spine. 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 No acute osseous pathology. IMPRESSION: 1. Large anterior mediastinal/prevascular space mass/adenopathy. The mass extends superiorly and abuts the upper trachea. 2. Large hypodense lesion in the right lobe of the liver as well as ill-defined splenic hypodense lesions. Further characterization with MRI without and with contrast recommended. The liver lesion is amenable to percutaneous tissue sampling. 3. Extensive mesenteric and retroperitoneal adenopathy. 4. Sigmoid diverticulosis. No bowel obstruction. Normal appendix. 5. Aortic Atherosclerosis (ICD10-I70.0). Electronically Signed   By: Anner Crete M.D.   On: 04/20/2020 21:11   CT Chest Wo Contrast  Result Date: 04/20/2020 CLINICAL DATA:  73 year old male with nausea vomiting. Epigastric pain. EXAM: CT CHEST, ABDOMEN AND PELVIS WITHOUT CONTRAST TECHNIQUE: Multidetector CT imaging of  the chest, abdomen and pelvis was performed following the standard protocol without IV contrast. COMPARISON:  None. FINDINGS: Evaluation of this exam is limited in the absence of intravenous contrast. CT CHEST FINDINGS Cardiovascular: There is no cardiomegaly or pericardial effusion. Coronary vascular calcification. There is moderate atherosclerotic calcification of the thoracic aorta. The central pulmonary arteries are grossly unremarkable on this noncontrast CT. Mediastinum/Nodes: Large anterior mediastinal/prevascular space mass/adenopathy abutting the anterior aspect of the aortic arch measuring 6.3 x 4.0 cm in greatest axial dimensions. There is superior extension of the mass into the upper mediastinum. Additional 2.7 x 2.2 cm nodular density in the region of the left hilum may represent portion of the mass or new adenopathy. The esophagus is grossly unremarkable. No mediastinal fluid collection. Lungs/Pleura: The lungs are clear. There is no pleural effusion pneumothorax. The central  airways are patent. Musculoskeletal: Degenerative changes of the spine. No acute osseous pathology. CT ABDOMEN PELVIS FINDINGS No intra-abdominal free air or free fluid. Hepatobiliary: There is an 8.6 x 8.2 cm low attenuating mass in the right lobe of the liver which is not characterized on this CT. Further characterization with MRI without and with contrast on a nonemergent/outpatient basis recommended. No intrahepatic biliary ductal dilatation. Probable sludge within the gallbladder. No calcified stone or pericholecystic fluid. Pancreas: No acute inflammation. No pancreatic atrophy or dilatation of the main pancreatic duct Spleen: Indeterminate and ill-defined hypodense lesions within the spleen. There is slight irregularity of the splenic contour. Adrenals/Urinary Tract: Mild thickening of the adrenal glands. There is no hydronephrosis or nephrolithiasis on either side. A 15 mm left renal upper pole hypodense lesion, likely a cyst. The visualized ureters and urinary bladder appear unremarkable. Stomach/Bowel: There is sigmoid diverticulosis without active inflammatory changes. There is no bowel obstruction or active inflammation. The appendix is normal. Vascular/Lymphatic: Advanced aortoiliac atherosclerotic disease. The IVC is unremarkable. No portal venous gas. Extensive mesenteric and retroperitoneal adenopathy. There is a 6.3 x 6.9 cm soft tissue mass in the mesentery abutting the inferior aspect of the uncinate process of the pancreas. Additional soft tissue mass/adenopathy medial to the ascending colon. Reproductive: Mild prostate enlargement. Other: None Musculoskeletal: Degenerative changes of the spine. 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 No acute osseous pathology. IMPRESSION: 1. Large anterior mediastinal/prevascular space mass/adenopathy. The mass extends superiorly and abuts the upper trachea. 2. Large hypodense lesion in the right lobe of the liver as well as ill-defined  splenic hypodense lesions. Further characterization with MRI without and with contrast recommended. The liver lesion is amenable to percutaneous tissue sampling. 3. Extensive mesenteric and retroperitoneal adenopathy. 4. Sigmoid diverticulosis. No bowel obstruction. Normal appendix. 5. Aortic Atherosclerosis (ICD10-I70.0). Electronically Signed   By: Anner Crete M.D.   On: 04/20/2020 21:11   DG Chest Port 1 View  Result Date: 04/20/2020 CLINICAL DATA:  Shortness of breath.  Constipation.  Weakness. EXAM: PORTABLE CHEST 1 VIEW COMPARISON:  06/09/2018 FINDINGS: Altered mediastinal contour compared to the prior exam, I cannot exclude aortic aneurysm, mediastinal mass, or hematoma. CT of the chest (with contrast if feasible) is recommended for further workup. The lungs appear clear.  Thoracic spondylosis noted. IMPRESSION: 1. Abnormal mediastinal contour, substantially changed from 06/09/2018. I cannot exclude aortic aneurysm, mediastinal mass, or hematoma. CT of the chest (with contrast if feasible) is recommended for further workup. 2. Thoracic spondylosis. Electronically Signed   By: Van Clines M.D.   On: 04/20/2020 18:48    ASSESSMENT & PLAN: 74 year old Caucasian male with past medical  history of hypertension and BPH presented with severe hypocalcemia, large mediastinal and liver mass, and extensive retroperitoneal adenopathy.  1.  Severe hypercalcemia, likely malignancy related 2.  Large mediastinal and liver mass, mesenteric and retroperitoneal lymphadenopathy, concerning for malignancy 3. HTN 4. Mild anemia and thrombocytopenia   Recommendations: -I reviewed his CT scan images, and lab results, and discussed with patient and his wife in detail. He is highly concerning malignancy. The differentiation is broad, which includes but not limited to cholangiocarcinoma, high-grade lymphoma, GU or GI malignancy. I recommend IR liver biopsy, peripheral core biopsy if they can. Please consult  IR -Tumor markers including CA 19.9, CEA and AFP has been ordered, hCG were negative. LDH was elevated 416.  -No lab evidence of hemolysis, iron deficiency or megaloblastic anemia -He has received pamidronate and IVF for hypercalcemia, which I agree. It may take a 2 to 3 days to improve his hypercalcemia, if persistent, may consider calcitonin and or Xgeva. I'll defer the management to the primary care team. -will f/u, plan to see him back after biopsy results returns. Will check him again on Monday  All questions were answered. The patient knows to call the clinic with any problems, questions or concerns.      Truitt Merle, MD 04/21/2020 12:53 PM

## 2020-04-21 NOTE — Progress Notes (Signed)
Patient refused use of CPAP for the evening. RT informed patient to have RN called if he changed his mind.

## 2020-04-21 NOTE — Progress Notes (Signed)
New Admission Note: ? Arrival Method: Stretcher  Mental Orientation: Alert and Oriented X4 Telemetry: Box 15 Assessment: Completed Skin: Refer to flowsheet IV: Right AC and Left AC Pain: 0 Tubes: None  Safety Measures: Safety Fall Prevention Plan discussed with patient. Admission: Completed [facility] [place] Orientation: Patient has been orientated to the room, unit and the staff. Family: None Orders have been reviewed and are being implemented. Will continue to monitor the patient. Call light has been placed within reach and bed alarm has been activated.  ? Milagros Loll, RN  Phone Number: [phone]

## 2020-04-21 NOTE — Progress Notes (Signed)
Nutrition Brief Note  Patient identified on the Malnutrition Screening Tool (MST) Report  Wt Readings from Last 15 Encounters:  04/21/20 94.8 kg  06/14/18 100.4 kg  06/09/18 99.8 kg  10/11/11 124.7 kg    Body mass index is 29.15 kg/m. Patient meets criteria for overweight based on current BMI.   Current diet order is 2gm sodium, patient is consuming approximately 75% of meals at this time. Labs and medications reviewed. Pt with hypercalcemia -- corrected calcium >15.   No nutrition interventions warranted at this time. If nutrition issues arise, please consult RD.   Larkin Ina, MS, RD, LDN RD pager number and weekend/on-call pager number located in [facility].

## 2020-04-21 NOTE — H&P (Signed)
Chief Complaint: Liver lesion  Referring Physician(s): [place] Donnamarie Poag, DO  Supervising Physician: Aletta Edouard  Patient Status: [facility] - In-pt  History of Present Illness: John [name] is a 74 y.o. male with past medical history of hypertension, BPH, was admitted yesterday for severe hypercalcemia.  He reports 2 weeks of fatigue and constipation.    He was seen by primary care physician and a labs done there hypercalcemia.    He was sent to ED for further evaluation.    He received IV fluids, pamidronate, repeated calcium level still above 15 last night.  CT showed= 1. Large anterior mediastinal/prevascular space mass/adenopathy. The mass extends superiorly and abuts the upper trachea. 2. Large hypodense lesion in the right lobe of the liver as well as ill-defined splenic hypodense lesions. Further characterization with MRI without and with contrast recommended. The liver lesion is amenable to percutaneous tissue sampling. 3. Extensive mesenteric and retroperitoneal adenopathy.  We are asked to perform an image guided biopsy of the liver lesion.  Past Medical History:  Diagnosis Date  . Hypertension   . Knee pain, right     Past Surgical History:  Procedure Laterality Date  . FOOT SURGERY    . TONSILLECTOMY      Allergies: Patient has no known allergies.  Medications: Prior to Admission medications   Medication Sig Start Date End Date Taking? Authorizing Provider  Ascorbic Acid (VITAMIN C WITH ROSE HIPS) 1000 MG tablet Take 1,000 mg by mouth daily.    [provider]  aspirin EC 81 MG tablet Take 81 mg by mouth daily.    [provider]  benazepril (LOTENSIN) 10 MG tablet Take 10 mg by mouth daily.    [provider]  Calcium Citrate-Vitamin D (CALCIUM CITRATE + D PO) Take 1 tablet by mouth daily.    [provider]  clobetasol ointment (TEMOVATE) 5.28 % Apply 1 application topically 2 (two) times daily.     [provider]  doxazosin (CARDURA) 4 MG tablet Take 4 mg by mouth daily.    [provider]  metoprolol tartrate (LOPRESSOR) 25 MG tablet Take 0.5 tablets (12.5 mg total) by mouth 2 (two) times daily. 06/09/18   Lajean Saver, MD  Multiple Vitamins-Minerals (MULTIVITAMIN PO) Take 1 tablet by mouth daily.    [provider]  Omega-3 Fatty Acids (OMEGA 3 PO) Take 1 tablet by mouth daily.    [provider]  Specialty Vitamins Products (PROSTATE PO) Take 2 tablets by mouth daily.    [provider]     Family History  Problem Relation Age of Onset  . Diabetes Mellitus II Brother   . Skin cancer Mother     Social History   Socioeconomic History  . Marital status: Married    Spouse name: Not on file  . Number of children: Not on file  . Years of education: Not on file  . Highest education level: Not on file  Occupational History  . Not on file  Tobacco Use  . Smoking status: Never Smoker  . Smokeless tobacco: Never Used  Vaping Use  . Vaping Use: Never used  Substance and Sexual Activity  . Alcohol use: No  . Drug use: No  . Sexual activity: Not on file  Other Topics Concern  . Not on file  Social History Narrative  . Not on file   Social Determinants of Health   Financial Resource Strain:   . Difficulty of Paying Living  Expenses: Not on file  Food Insecurity:   . Worried About Charity fundraiser in the Last Year: Not on file  . Ran Out of Food in the Last Year: Not on file  Transportation Needs:   . Lack of Transportation (Medical): Not on file  . Lack of Transportation (Non-Medical): Not on file  Physical Activity:   . Days of Exercise per Week: Not on file  . Minutes of Exercise per Session: Not on file  Stress:   . Feeling of Stress : Not on file  Social Connections:   . Frequency of Communication with Friends and Family: Not on file  . Frequency of Social Gatherings with Friends and Family: Not on file  . Attends  Religious Services: Not on file  . Active Member of Clubs or Organizations: Not on file  . Attends Archivist Meetings: Not on file  . Marital Status: Not on file     Review of Systems: A 12 point ROS discussed and pertinent positives are indicated in the HPI above.  All other systems are negative.  Review of Systems  Vital Signs: BP (!) 127/59 (BP Location: Right Arm)   Pulse 70   Temp 98 F (36.7 C) (Oral)   Resp 18   Ht 5\' 11"  (1.803 m)   Wt 94.8 kg   SpO2 96%   BMI 29.15 kg/m   Physical Exam Vitals reviewed.  Constitutional:      Appearance: Normal appearance.  HENT:     Head: Normocephalic and atraumatic.  Eyes:     Extraocular Movements: Extraocular movements intact.  Cardiovascular:     Rate and Rhythm: Normal rate and regular rhythm.  Pulmonary:     Effort: Pulmonary effort is normal. No respiratory distress.     Breath sounds: Normal breath sounds.  Abdominal:     General: There is no distension.     Palpations: Abdomen is soft.     Tenderness: There is no abdominal tenderness.  Musculoskeletal:        General: Normal range of motion.     Cervical back: Normal range of motion.  Skin:    General: Skin is warm and dry.  Neurological:     General: No focal deficit present.     Mental Status: He is alert and oriented to person, place, and time.  Psychiatric:        Mood and Affect: Mood normal.        Behavior: Behavior normal.        Thought Content: Thought content normal.        Judgment: Judgment normal.     Imaging: CT ABDOMEN PELVIS WO CONTRAST  Result Date: 04/20/2020 CLINICAL DATA:  74 year old male with nausea vomiting. Epigastric pain. EXAM: CT CHEST, ABDOMEN AND PELVIS WITHOUT CONTRAST TECHNIQUE: Multidetector CT imaging of the chest, abdomen and pelvis was performed following the standard protocol without IV contrast. COMPARISON:  None. FINDINGS: Evaluation of this exam is limited in the absence of intravenous contrast. CT CHEST  FINDINGS Cardiovascular: There is no cardiomegaly or pericardial effusion. Coronary vascular calcification. There is moderate atherosclerotic calcification of the thoracic aorta. The central pulmonary arteries are grossly unremarkable on this noncontrast CT. Mediastinum/Nodes: Large anterior mediastinal/prevascular space mass/adenopathy abutting the anterior aspect of the aortic arch measuring 6.3 x 4.0 cm in greatest axial dimensions. There is superior extension of the mass into the upper mediastinum. Additional 2.7 x 2.2 cm nodular density in the region of the left hilum may  represent portion of the mass or new adenopathy. The esophagus is grossly unremarkable. No mediastinal fluid collection. Lungs/Pleura: The lungs are clear. There is no pleural effusion pneumothorax. The central airways are patent. Musculoskeletal: Degenerative changes of the spine. No acute osseous pathology. CT ABDOMEN PELVIS FINDINGS No intra-abdominal free air or free fluid. Hepatobiliary: There is an 8.6 x 8.2 cm low attenuating mass in the right lobe of the liver which is not characterized on this CT. Further characterization with MRI without and with contrast on a nonemergent/outpatient basis recommended. No intrahepatic biliary ductal dilatation. Probable sludge within the gallbladder. No calcified stone or pericholecystic fluid. Pancreas: No acute inflammation. No pancreatic atrophy or dilatation of the main pancreatic duct Spleen: Indeterminate and ill-defined hypodense lesions within the spleen. There is slight irregularity of the splenic contour. Adrenals/Urinary Tract: Mild thickening of the adrenal glands. There is no hydronephrosis or nephrolithiasis on either side. A 15 mm left renal upper pole hypodense lesion, likely a cyst. The visualized ureters and urinary bladder appear unremarkable. Stomach/Bowel: There is sigmoid diverticulosis without active inflammatory changes. There is no bowel obstruction or active inflammation. The  appendix is normal. Vascular/Lymphatic: Advanced aortoiliac atherosclerotic disease. The IVC is unremarkable. No portal venous gas. Extensive mesenteric and retroperitoneal adenopathy. There is a 6.3 x 6.9 cm soft tissue mass in the mesentery abutting the inferior aspect of the uncinate process of the pancreas. Additional soft tissue mass/adenopathy medial to the ascending colon. Reproductive: Mild prostate enlargement. Other: None Musculoskeletal: Degenerative changes of the spine. 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 No acute osseous pathology. IMPRESSION: 1. Large anterior mediastinal/prevascular space mass/adenopathy. The mass extends superiorly and abuts the upper trachea. 2. Large hypodense lesion in the right lobe of the liver as well as ill-defined splenic hypodense lesions. Further characterization with MRI without and with contrast recommended. The liver lesion is amenable to percutaneous tissue sampling. 3. Extensive mesenteric and retroperitoneal adenopathy. 4. Sigmoid diverticulosis. No bowel obstruction. Normal appendix. 5. Aortic Atherosclerosis (ICD10-I70.0). Electronically Signed   By: Anner Crete M.D.   On: 04/20/2020 21:11   CT Chest Wo Contrast  Result Date: 04/20/2020 CLINICAL DATA:  74 year old male with nausea vomiting. Epigastric pain. EXAM: CT CHEST, ABDOMEN AND PELVIS WITHOUT CONTRAST TECHNIQUE: Multidetector CT imaging of the chest, abdomen and pelvis was performed following the standard protocol without IV contrast. COMPARISON:  None. FINDINGS: Evaluation of this exam is limited in the absence of intravenous contrast. CT CHEST FINDINGS Cardiovascular: There is no cardiomegaly or pericardial effusion. Coronary vascular calcification. There is moderate atherosclerotic calcification of the thoracic aorta. The central pulmonary arteries are grossly unremarkable on this noncontrast CT. Mediastinum/Nodes: Large anterior mediastinal/prevascular space  mass/adenopathy abutting the anterior aspect of the aortic arch measuring 6.3 x 4.0 cm in greatest axial dimensions. There is superior extension of the mass into the upper mediastinum. Additional 2.7 x 2.2 cm nodular density in the region of the left hilum may represent portion of the mass or new adenopathy. The esophagus is grossly unremarkable. No mediastinal fluid collection. Lungs/Pleura: The lungs are clear. There is no pleural effusion pneumothorax. The central airways are patent. Musculoskeletal: Degenerative changes of the spine. No acute osseous pathology. CT ABDOMEN PELVIS FINDINGS No intra-abdominal free air or free fluid. Hepatobiliary: There is an 8.6 x 8.2 cm low attenuating mass in the right lobe of the liver which is not characterized on this CT. Further characterization with MRI without and with contrast on a nonemergent/outpatient basis recommended. No intrahepatic biliary  ductal dilatation. Probable sludge within the gallbladder. No calcified stone or pericholecystic fluid. Pancreas: No acute inflammation. No pancreatic atrophy or dilatation of the main pancreatic duct Spleen: Indeterminate and ill-defined hypodense lesions within the spleen. There is slight irregularity of the splenic contour. Adrenals/Urinary Tract: Mild thickening of the adrenal glands. There is no hydronephrosis or nephrolithiasis on either side. A 15 mm left renal upper pole hypodense lesion, likely a cyst. The visualized ureters and urinary bladder appear unremarkable. Stomach/Bowel: There is sigmoid diverticulosis without active inflammatory changes. There is no bowel obstruction or active inflammation. The appendix is normal. Vascular/Lymphatic: Advanced aortoiliac atherosclerotic disease. The IVC is unremarkable. No portal venous gas. Extensive mesenteric and retroperitoneal adenopathy. There is a 6.3 x 6.9 cm soft tissue mass in the mesentery abutting the inferior aspect of the uncinate process of the pancreas.  Additional soft tissue mass/adenopathy medial to the ascending colon. Reproductive: Mild prostate enlargement. Other: None Musculoskeletal: Degenerative changes of the spine. 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 No acute osseous pathology. IMPRESSION: 1. Large anterior mediastinal/prevascular space mass/adenopathy. The mass extends superiorly and abuts the upper trachea. 2. Large hypodense lesion in the right lobe of the liver as well as ill-defined splenic hypodense lesions. Further characterization with MRI without and with contrast recommended. The liver lesion is amenable to percutaneous tissue sampling. 3. Extensive mesenteric and retroperitoneal adenopathy. 4. Sigmoid diverticulosis. No bowel obstruction. Normal appendix. 5. Aortic Atherosclerosis (ICD10-I70.0). Electronically Signed   By: Anner Crete M.D.   On: 04/20/2020 21:11   DG Chest Port 1 View  Result Date: 04/20/2020 CLINICAL DATA:  Shortness of breath.  Constipation.  Weakness. EXAM: PORTABLE CHEST 1 VIEW COMPARISON:  06/09/2018 FINDINGS: Altered mediastinal contour compared to the prior exam, I cannot exclude aortic aneurysm, mediastinal mass, or hematoma. CT of the chest (with contrast if feasible) is recommended for further workup. The lungs appear clear.  Thoracic spondylosis noted. IMPRESSION: 1. Abnormal mediastinal contour, substantially changed from 06/09/2018. I cannot exclude aortic aneurysm, mediastinal mass, or hematoma. CT of the chest (with contrast if feasible) is recommended for further workup. 2. Thoracic spondylosis. Electronically Signed   By: Van Clines M.D.   On: 04/20/2020 18:48    Labs:  CBC: Recent Labs    04/20/20 1633 04/20/20 1818 04/21/20 0059  WBC 6.4 5.8 4.3  HGB 12.6* 12.2* 10.9*  HCT 38.6* 36.3* 31.6*  PLT 147* 150 126*    COAGS: No results for input(s): INR, APTT in the last 8760 hours.  BMP: Recent Labs    04/20/20 1633 04/20/20 1818 04/21/20 0059    NA 136 130* 136  K 3.9 4.1 3.6  CL 93* 91* 101  CO2 32 28 27  GLUCOSE 94 91 89  BUN 40* 43* 41*  CALCIUM >15.0* >15.0* >15.0*  CREATININE 2.65* 2.67* 2.61*  GFRNONAA 25* 25* 25*    LIVER FUNCTION TESTS: Recent Labs    04/20/20 1818 04/21/20 0059  BILITOT 1.6* 1.5*  AST 71* 54*  ALT 32 30  ALKPHOS 93 77  PROT 5.7* 5.1*  ALBUMIN 3.4* 2.9*    TUMOR MARKERS: No results for input(s): AFPTM, CEA, CA199, CHROMGRNA in the last 8760 hours.  Assessment and Plan:  Large hypodense lesion in the right lobe of the liver concerning for metastatic disease.  He is unable to get MRI with contrast secondary to CKD with creatinine 2.65.  Will proceed with image guided biopsy of the hepatic lesion on Monday.  Thank you for this  interesting consult.  I greatly enjoyed meeting John [name] and look forward to participating in their care.  A copy of this report was sent to the requesting provider on this date.  Electronically Signed: Murrell Redden, PA-C   04/21/2020, 1:07 PM      I spent a total of 40 Minutes in face to face in clinical consultation, greater than 50% of which was counseling/coordinating care for liver lesion biopsy.

## 2020-04-22 ENCOUNTER — Inpatient Hospital Stay (HOSPITAL_COMMUNITY): Payer: Medicare HMO

## 2020-04-22 LAB — COMPREHENSIVE METABOLIC PANEL
ALT: 43 U/L (ref 0–44)
AST: 66 U/L — ABNORMAL HIGH (ref 15–41)
Albumin: 2.6 g/dL — ABNORMAL LOW (ref 3.5–5.0)
Alkaline Phosphatase: 95 U/L (ref 38–126)
Anion gap: 8 (ref 5–15)
BUN: 41 mg/dL — ABNORMAL HIGH (ref 8–23)
CO2: 24 mmol/L (ref 22–32)
Calcium: 13.1 mg/dL (ref 8.9–10.3)
Chloride: 104 mmol/L (ref 98–111)
Creatinine, Ser: 2.74 mg/dL — ABNORMAL HIGH (ref 0.61–1.24)
GFR, Estimated: 24 mL/min — ABNORMAL LOW (ref >60)
Glucose, Bld: 86 mg/dL (ref 70–99)
Potassium: 3.4 mmol/L — ABNORMAL LOW (ref 3.5–5.1)
Sodium: 136 mmol/L (ref 135–145)
Total Bilirubin: 1.4 mg/dL — ABNORMAL HIGH (ref 0.3–1.2)
Total Protein: 4.5 g/dL — ABNORMAL LOW (ref 6.5–8.1)

## 2020-04-22 LAB — AFP TUMOR MARKER: AFP, Serum, Tumor Marker: 2 ng/mL (ref 0.0–8.3)

## 2020-04-22 LAB — SODIUM, URINE, RANDOM: Sodium, Ur: 76 mmol/L

## 2020-04-22 LAB — CBC
HCT: 31.8 % — ABNORMAL LOW (ref 39.0–52.0)
Hemoglobin: 10.5 g/dL — ABNORMAL LOW (ref 13.0–17.0)
MCH: 28.5 pg (ref 26.0–34.0)
MCHC: 33 g/dL (ref 30.0–36.0)
MCV: 86.4 fL (ref 80.0–100.0)
Platelets: 112 10*3/uL — ABNORMAL LOW (ref 150–400)
RBC: 3.68 MIL/uL — ABNORMAL LOW (ref 4.22–5.81)
RDW: 13.3 % (ref 11.5–15.5)
WBC: 4 10*3/uL (ref 4.0–10.5)
nRBC: 0 % (ref 0.0–0.2)

## 2020-04-22 LAB — CREATININE, URINE, RANDOM: Creatinine, Urine: 51.1 mg/dL

## 2020-04-22 LAB — CANCER ANTIGEN 19-9: CA 19-9: <2 U/mL (ref 0–35)

## 2020-04-22 LAB — PROTIME-INR
INR: 1.3 — ABNORMAL HIGH (ref 0.8–1.2)
Prothrombin Time: 16.1 seconds — ABNORMAL HIGH (ref 11.4–15.2)

## 2020-04-22 LAB — CEA: CEA: 1.9 ng/mL (ref 0.0–4.7)

## 2020-04-22 MED ORDER — FLUTICASONE PROPIONATE 50 MCG/ACT NA SUSP
2.0000 | Freq: Every day | NASAL | Status: DC
  Administered 2020-04-22 – 2020-04-26 (×5): 2 via NASAL
  Filled 2020-04-22: qty 16

## 2020-04-22 MED ORDER — POTASSIUM CHLORIDE CRYS ER 20 MEQ PO TBCR
30.0000 meq | EXTENDED_RELEASE_TABLET | ORAL | Status: AC
  Administered 2020-04-22 (×2): 30 meq via ORAL
  Filled 2020-04-22 (×2): qty 1

## 2020-04-22 NOTE — Progress Notes (Signed)
PROGRESS NOTE    ISAAK DELMUNDO  DQQ:[id number] DOB: [birth year] DOA: 04/20/2020 PCP: Christain Sacramento, MD    Brief Narrative:  John [name] is a 74 year old male with past medical history notable for essential hypertension, OSA on CPAP who presented to the ED with complaints of weakness and constipation over the past 2-3 weeks.  Was seen by his PCP and given laxatives; which assisted in resolution of his constipation.  Given persistent weakness, he proceeded back to his PCP with lab testing performed that showed critically high calcium levels and was then instructed to come to the emergency department.  Patient did not take any calcium supplementation at home.  His wife also reports that he is been slightly confused over this timeframe as well.  Denies fever/chills/night sweats, no nausea vomiting, shortness of breath or chest pain.  In the ED, creatinine 2.6 which is elevated from January 2020.  Calcium greater than 15, hemoglobin 12.6, platelets 147.  CT chest/abdomen/pelvis without contrast notable for anterior mediastinal mass and right lobe hypodensity.  Patient was started on 2 L NS bolus.  Hospitalist service consulted for admission for further evaluation and treatment.   Assessment & Plan:   Principal Problem:   Hypercalcemia Active Problems:   Essential hypertension   ARF (acute renal failure) (HCC)   Mediastinal mass   Liver mass   Anterior mediastinal mass Right hepatic hypodensity Patient presenting with progressive weakness and constipation over the past 2-3 weeks.  Reports 10 pound weight loss over the past month.  Was noted to have severely elevated calcium level at PCP visit.  CT chest/abdomen notable for large anterior mediastinal mass with right liver lobe hypodensity and extensive mets to [place] Sterbenz/retroperitoneal adenopathy.  LDH elevated 416. Concern for lymphoma versus thymoma versus germinal cell tumor. --Medical oncology, Dr. Burr Medico following, appreciate  assistance --Pending ultrasound-guided liver biopsy tomorrow --AFP, beta hCG, CEA, CA 19-9: Pending  Severe hypercalcemia Suspect hypercalcemia of malignancy given mediastinal/hepatic mass.  Not on calcium or vitamin D supplements.  Vitamin D 25 hydroxy 21.57 (low). Received 2L NS bolus followed by pamidronate 90 mg IV on 04/21/2020. --Ca >15, 13.1 --PTH-related peptide pending --PTH-intact: pending --continue NS at 150 mL/hr --Continue close monitoring of calcium level  Acute renal failure Creatinine 2.67 on admission, baseline 0.78 06/2018.  Ultrasound with 2.4 cm benign-appearing left renal cyst, otherwise normal appearance; no mass or hydronephrosis. --Cr 2.67>2.61>2.74 --Holding home lisinopril  Anemia Thrombocytopenia Hemoglobin 10.9, platelets 126 on admission.  Suspect etiology from malignancy as above.  Iron 59, TIBC 286, ferritin 1266, folate 27.9, B12 497. --SPEP --Peripheral smear --continue to monitor CBC daily  Essential hypertension --Holding lisinopril as above for acute renal failure --Doxazosin 4 mg p.o. daily --Metoprolol tartrate 12.5 mg p.o. twice daily  OSA --Continue nocturnal CPAP  DVT prophylaxis: SCDs Code Status: Full code Family Communication: Discussed with spouse present at bedside  Disposition Plan:  Status is: Inpatient  Remains inpatient appropriate because:Persistent severe electrolyte disturbances, Ongoing diagnostic testing needed not appropriate for outpatient work up, Unsafe d/c plan, IV treatments appropriate due to intensity of illness or inability to take PO and Inpatient level of care appropriate due to severity of illness   Dispo:  Patient From: Home  Planned Disposition: Home  Expected discharge date: 04/24/20  Medically stable for discharge: No   Consultants:   Medical oncology, Dr. Burr Medico  Interventional radiology  Procedures:   None  Antimicrobials:   None   Subjective: Patient seen and examined at bedside,  resting comfortably lying in bed.  Spouse present at bedside.  Confusion improved.  Calcium now down to 13.1 today.  Awaiting ultrasound-guided biopsy of liver planned for tomorrow.  Patient and spouse very concerned regarding diagnosis and ultimate prognosis.  No other complaints or concerns at this time. Denies headache, no visual changes, no chest pain, no palpitations, no shortness of breath, no abdominal pain, no paresthesias.  No acute events overnight per nursing staff.  Objective: Vitals:   04/21/20 2142 04/22/20 0500 04/22/20 0624 04/22/20 0929  BP: (!) 147/63  131/64 137/63  Pulse: 73  71 72  Resp: 18  20 18   Temp: 100 F (37.8 C)  98.6 F (37 C) 98.5 F (36.9 C)  TempSrc: Oral  Oral Oral  SpO2: 95%  94% 94%  Weight: 100.9 kg 100.9 kg    Height:        Intake/Output Summary (Last 24 hours) at 04/22/2020 1321 Last data filed at 04/22/2020 1309 Gross per 24 hour  Intake 1823.34 ml  Output 750 ml  Net 1073.34 ml   Filed Weights   04/21/20 0316 04/21/20 2142 04/22/20 0500  Weight: 94.8 kg 100.9 kg 100.9 kg    Examination:  General exam: Appears calm and comfortable  Respiratory system: Clear to auscultation. Respiratory effort normal.  Oxygenating well on room air Cardiovascular system: S1 & S2 heard, RRR. No JVD, murmurs, rubs, gallops or clicks.  Trace lower extremity edema bilaterally to mid shin Gastrointestinal system: Abdomen is nondistended, soft and nontender. No organomegaly or masses felt. Normal bowel sounds heard. Central nervous system: Alert and oriented. No focal neurological deficits. Extremities: Symmetric 5 x 5 power. Skin: No rashes, lesions or ulcers Psychiatry: Judgement and insight appear normal. Mood & affect appropriate.     Data Reviewed: I have personally reviewed following labs and imaging studies  CBC: Recent Labs  Lab 04/20/20 1633 04/20/20 1818 04/21/20 0059 04/22/20 0210  WBC 6.4 5.8 4.3 4.0  NEUTROABS  --  4.4 3.2  --   HGB  12.6* 12.2* 10.9* 10.5*  HCT 38.6* 36.3* 31.6* 31.8*  MCV 87.9 87.3 86.6 86.4  PLT 147* 150 126* 662*   Basic Metabolic Panel: Recent Labs  Lab 04/20/20 1633 04/20/20 1818 04/21/20 0059 04/22/20 0210  NA 136 130* 136 136  K 3.9 4.1 3.6 3.4*  CL 93* 91* 101 104  CO2 32 28 27 24   GLUCOSE 94 91 89 86  BUN 40* 43* 41* 41*  CREATININE 2.65* 2.67* 2.61* 2.74*  CALCIUM >15.0* >15.0* >15.0* 13.1*  MG  --  2.0  --   --   PHOS  --   --  4.0  --    GFR: Estimated Creatinine Clearance: 29.5 mL/min (A) (by C-G formula based on SCr of 2.74 mg/dL (H)). Liver Function Tests: Recent Labs  Lab 04/20/20 1818 04/21/20 0059 04/22/20 0210  AST 71* 54* 66*  ALT 32 30 43  ALKPHOS 93 77 95  BILITOT 1.6* 1.5* 1.4*  PROT 5.7* 5.1* 4.5*  ALBUMIN 3.4* 2.9* 2.6*   No results for input(s): LIPASE, AMYLASE in the last 168 hours. No results for input(s): AMMONIA in the last 168 hours. Coagulation Profile: Recent Labs  Lab 04/22/20 0210  INR 1.3*   Cardiac Enzymes: No results for input(s): CKTOTAL, CKMB, CKMBINDEX, TROPONINI in the last 168 hours. BNP (last 3 results) No results for input(s): PROBNP in the last 8760 hours. HbA1C: No results for input(s): HGBA1C in the last 72 hours. CBG: Recent Labs  Lab 04/20/20 1857  GLUCAP 92   Lipid Profile: No results for input(s): CHOL, HDL, LDLCALC, TRIG, CHOLHDL, LDLDIRECT in the last 72 hours. Thyroid Function Tests: No results for input(s): TSH, T4TOTAL, FREET4, T3FREE, THYROIDAB in the last 72 hours. Anemia Panel: Recent Labs    04/21/20 0505  VITAMINB12 497  FOLATE 27.9  FERRITIN 1,266*  TIBC 286  IRON 59  RETICCTPCT 1.2   Sepsis Labs: No results for input(s): PROCALCITON, LATICACIDVEN in the last 168 hours.  Recent Results (from the past 240 hour(s))  Respiratory Panel by RT PCR (Flu A&B, Covid) - Nasopharyngeal Swab     Status: None   Collection Time: 04/20/20  6:25 PM   Specimen: Nasopharyngeal Swab  Result Value Ref Range  Status   SARS Coronavirus 2 by RT PCR NEGATIVE NEGATIVE Final    Comment: (NOTE) SARS-CoV-2 target nucleic acids are NOT DETECTED.  The SARS-CoV-2 RNA is generally detectable in upper respiratoy specimens during the acute phase of infection. The lowest concentration of SARS-CoV-2 viral copies this assay can detect is 131 copies/mL. A negative result does not preclude SARS-Cov-2 infection and should not be used as the sole basis for treatment or other patient management decisions. A negative result may occur with  improper specimen collection/handling, submission of specimen other than nasopharyngeal swab, presence of viral mutation(s) within the areas targeted by this assay, and inadequate number of viral copies (<131 copies/mL). A negative result must be combined with clinical observations, patient history, and epidemiological information. The expected result is Negative.  Fact Sheet for Patients:  PinkCheek.be  Fact Sheet for Healthcare Providers:  GravelBags.it  This test is no t yet approved or cleared by the [place] FDA and  has been authorized for detection and/or diagnosis of SARS-CoV-2 by FDA under an Emergency Use Authorization (EUA). This EUA will remain  in effect (meaning this test can be used) for the duration of the COVID-19 declaration under Section 564(b)(1) of the Act, 21 U.S.C. section 360bbb-3(b)(1), unless the authorization is terminated or revoked sooner.     Influenza A by PCR NEGATIVE NEGATIVE Final   Influenza B by PCR NEGATIVE NEGATIVE Final    Comment: (NOTE) The Xpert Xpress SARS-CoV-2/FLU/RSV assay is intended as an aid in  the diagnosis of influenza from Nasopharyngeal swab specimens and  should not be used as a sole basis for treatment. Nasal washings and  aspirates are unacceptable for Xpert Xpress SARS-CoV-2/FLU/RSV  testing.  Fact Sheet for  Patients: PinkCheek.be  Fact Sheet for Healthcare Providers: GravelBags.it  This test is not yet approved or cleared by the [place] FDA and  has been authorized for detection and/or diagnosis of SARS-CoV-2 by  FDA under an Emergency Use Authorization (EUA). This EUA will remain  in effect (meaning this test can be used) for the duration of the  Covid-19 declaration under Section 564(b)(1) of the Act, 21  U.S.C. section 360bbb-3(b)(1), unless the authorization is  terminated or revoked. Performed at [facility], [place] [address] [place], [place] [address]          Radiology Studies: CT ABDOMEN PELVIS WO CONTRAST  Result Date: 04/20/2020 CLINICAL DATA:  74 year old male with nausea vomiting. Epigastric pain. EXAM: CT CHEST, ABDOMEN AND PELVIS WITHOUT CONTRAST TECHNIQUE: Multidetector CT imaging of the chest, abdomen and pelvis was performed following the standard protocol without IV contrast. COMPARISON:  None. FINDINGS: Evaluation of this exam is limited in the absence of intravenous contrast. CT CHEST FINDINGS Cardiovascular: There is  no cardiomegaly or pericardial effusion. Coronary vascular calcification. There is moderate atherosclerotic calcification of the thoracic aorta. The central pulmonary arteries are grossly unremarkable on this noncontrast CT. Mediastinum/Nodes: Large anterior mediastinal/prevascular space mass/adenopathy abutting the anterior aspect of the aortic arch measuring 6.3 x 4.0 cm in greatest axial dimensions. There is superior extension of the mass into the upper mediastinum. Additional 2.7 x 2.2 cm nodular density in the region of the left hilum may represent portion of the mass or new adenopathy. The esophagus is grossly unremarkable. No mediastinal fluid collection. Lungs/Pleura: The lungs are clear. There is no pleural effusion pneumothorax. The central airways are patent. Musculoskeletal:  Degenerative changes of the spine. No acute osseous pathology. CT ABDOMEN PELVIS FINDINGS No intra-abdominal free air or free fluid. Hepatobiliary: There is an 8.6 x 8.2 cm low attenuating mass in the right lobe of the liver which is not characterized on this CT. Further characterization with MRI without and with contrast on a nonemergent/outpatient basis recommended. No intrahepatic biliary ductal dilatation. Probable sludge within the gallbladder. No calcified stone or pericholecystic fluid. Pancreas: No acute inflammation. No pancreatic atrophy or dilatation of the main pancreatic duct Spleen: Indeterminate and ill-defined hypodense lesions within the spleen. There is slight irregularity of the splenic contour. Adrenals/Urinary Tract: Mild thickening of the adrenal glands. There is no hydronephrosis or nephrolithiasis on either side. A 15 mm left renal upper pole hypodense lesion, likely a cyst. The visualized ureters and urinary bladder appear unremarkable. Stomach/Bowel: There is sigmoid diverticulosis without active inflammatory changes. There is no bowel obstruction or active inflammation. The appendix is normal. Vascular/Lymphatic: Advanced aortoiliac atherosclerotic disease. The IVC is unremarkable. No portal venous gas. Extensive mesenteric and retroperitoneal adenopathy. There is a 6.3 x 6.9 cm soft tissue mass in the mesentery abutting the inferior aspect of the uncinate process of the pancreas. Additional soft tissue mass/adenopathy medial to the ascending colon. Reproductive: Mild prostate enlargement. Other: None Musculoskeletal: Degenerative changes of the spine. 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 No acute osseous pathology. IMPRESSION: 1. Large anterior mediastinal/prevascular space mass/adenopathy. The mass extends superiorly and abuts the upper trachea. 2. Large hypodense lesion in the right lobe of the liver as well as ill-defined splenic hypodense lesions. Further  characterization with MRI without and with contrast recommended. The liver lesion is amenable to percutaneous tissue sampling. 3. Extensive mesenteric and retroperitoneal adenopathy. 4. Sigmoid diverticulosis. No bowel obstruction. Normal appendix. 5. Aortic Atherosclerosis (ICD10-I70.0). Electronically Signed   By: Anner Crete M.D.   On: 04/20/2020 21:11   CT Chest Wo Contrast  Result Date: 04/20/2020 CLINICAL DATA:  74 year old male with nausea vomiting. Epigastric pain. EXAM: CT CHEST, ABDOMEN AND PELVIS WITHOUT CONTRAST TECHNIQUE: Multidetector CT imaging of the chest, abdomen and pelvis was performed following the standard protocol without IV contrast. COMPARISON:  None. FINDINGS: Evaluation of this exam is limited in the absence of intravenous contrast. CT CHEST FINDINGS Cardiovascular: There is no cardiomegaly or pericardial effusion. Coronary vascular calcification. There is moderate atherosclerotic calcification of the thoracic aorta. The central pulmonary arteries are grossly unremarkable on this noncontrast CT. Mediastinum/Nodes: Large anterior mediastinal/prevascular space mass/adenopathy abutting the anterior aspect of the aortic arch measuring 6.3 x 4.0 cm in greatest axial dimensions. There is superior extension of the mass into the upper mediastinum. Additional 2.7 x 2.2 cm nodular density in the region of the left hilum may represent portion of the mass or new adenopathy. The esophagus is grossly unremarkable. No mediastinal fluid collection. Lungs/Pleura:  The lungs are clear. There is no pleural effusion pneumothorax. The central airways are patent. Musculoskeletal: Degenerative changes of the spine. No acute osseous pathology. CT ABDOMEN PELVIS FINDINGS No intra-abdominal free air or free fluid. Hepatobiliary: There is an 8.6 x 8.2 cm low attenuating mass in the right lobe of the liver which is not characterized on this CT. Further characterization with MRI without and with contrast on a  nonemergent/outpatient basis recommended. No intrahepatic biliary ductal dilatation. Probable sludge within the gallbladder. No calcified stone or pericholecystic fluid. Pancreas: No acute inflammation. No pancreatic atrophy or dilatation of the main pancreatic duct Spleen: Indeterminate and ill-defined hypodense lesions within the spleen. There is slight irregularity of the splenic contour. Adrenals/Urinary Tract: Mild thickening of the adrenal glands. There is no hydronephrosis or nephrolithiasis on either side. A 15 mm left renal upper pole hypodense lesion, likely a cyst. The visualized ureters and urinary bladder appear unremarkable. Stomach/Bowel: There is sigmoid diverticulosis without active inflammatory changes. There is no bowel obstruction or active inflammation. The appendix is normal. Vascular/Lymphatic: Advanced aortoiliac atherosclerotic disease. The IVC is unremarkable. No portal venous gas. Extensive mesenteric and retroperitoneal adenopathy. There is a 6.3 x 6.9 cm soft tissue mass in the mesentery abutting the inferior aspect of the uncinate process of the pancreas. Additional soft tissue mass/adenopathy medial to the ascending colon. Reproductive: Mild prostate enlargement. Other: None Musculoskeletal: Degenerative changes of the spine. 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 No acute osseous pathology. IMPRESSION: 1. Large anterior mediastinal/prevascular space mass/adenopathy. The mass extends superiorly and abuts the upper trachea. 2. Large hypodense lesion in the right lobe of the liver as well as ill-defined splenic hypodense lesions. Further characterization with MRI without and with contrast recommended. The liver lesion is amenable to percutaneous tissue sampling. 3. Extensive mesenteric and retroperitoneal adenopathy. 4. Sigmoid diverticulosis. No bowel obstruction. Normal appendix. 5. Aortic Atherosclerosis (ICD10-I70.0). Electronically Signed   By: Anner Crete M.D.   On: 04/20/2020 21:11   US RENAL  Result Date: 04/22/2020 CLINICAL DATA:  Acute renal failure. EXAM: RENAL / URINARY TRACT ULTRASOUND COMPLETE COMPARISON:  CT 04/20/2020 FINDINGS: Right Kidney: Renal measurements: 12.7 x 5.4 x 5.5 cm = volume: 195 mL. Echogenicity within normal limits. No mass or hydronephrosis visualized. Left Kidney: Renal measurements: 12.9 x 5.9 x 5.0 cm = volume: 198 mL. Echogenicity within normal limits. No hydronephrosis visualized. Benign-appearing cyst measures 2.4 cm in the upper pole of the left kidney. Bladder: Appears normal for degree of bladder distention. Prevoid volume is 71 cc. Other: None. IMPRESSION: 1. 2.4 cm benign-appearing left renal cyst. 2. Otherwise normal appearance of the kidneys and bladder. Electronically Signed   By: Fidela Salisbury M.D.   On: 04/22/2020 10:30   DG Chest Port 1 View  Result Date: 04/20/2020 CLINICAL DATA:  Shortness of breath.  Constipation.  Weakness. EXAM: PORTABLE CHEST 1 VIEW COMPARISON:  06/09/2018 FINDINGS: Altered mediastinal contour compared to the prior exam, I cannot exclude aortic aneurysm, mediastinal mass, or hematoma. CT of the chest (with contrast if feasible) is recommended for further workup. The lungs appear clear.  Thoracic spondylosis noted. IMPRESSION: 1. Abnormal mediastinal contour, substantially changed from 06/09/2018. I cannot exclude aortic aneurysm, mediastinal mass, or hematoma. CT of the chest (with contrast if feasible) is recommended for further workup. 2. Thoracic spondylosis. Electronically Signed   By: Van Clines M.D.   On: 04/20/2020 18:48        Scheduled Meds: . doxazosin  4 mg  Oral Daily  . fluticasone  2 spray Each Nare Daily  . metoprolol tartrate  12.5 mg Oral BID   Continuous Infusions: . sodium chloride 150 mL/hr at 04/22/20 0743     LOS: 1 day    Time spent: 36 minutes spent on chart review, discussion with nursing staff, consultants, updating family  and interview/physical exam; more than 50% of that time was spent in counseling and/or coordination of care.    Shandel Busic J [place] DO [facility] Available via Epic secure chat 7am-7pm After these hours, please refer to coverage provider listed on [email or web address] 04/22/2020, 1:21 PM

## 2020-04-23 ENCOUNTER — Inpatient Hospital Stay (HOSPITAL_COMMUNITY): Payer: Medicare HMO

## 2020-04-23 LAB — COMPREHENSIVE METABOLIC PANEL
ALT: 51 U/L — ABNORMAL HIGH (ref 0–44)
AST: 72 U/L — ABNORMAL HIGH (ref 15–41)
Albumin: 2.5 g/dL — ABNORMAL LOW (ref 3.5–5.0)
Alkaline Phosphatase: 127 U/L — ABNORMAL HIGH (ref 38–126)
Anion gap: 9 (ref 5–15)
BUN: 42 mg/dL — ABNORMAL HIGH (ref 8–23)
CO2: 21 mmol/L — ABNORMAL LOW (ref 22–32)
Calcium: 12.1 mg/dL — ABNORMAL HIGH (ref 8.9–10.3)
Chloride: 108 mmol/L (ref 98–111)
Creatinine, Ser: 2.75 mg/dL — ABNORMAL HIGH (ref 0.61–1.24)
GFR, Estimated: 24 mL/min — ABNORMAL LOW (ref >60)
Glucose, Bld: 79 mg/dL (ref 70–99)
Potassium: 3.9 mmol/L (ref 3.5–5.1)
Sodium: 138 mmol/L (ref 135–145)
Total Bilirubin: 1.4 mg/dL — ABNORMAL HIGH (ref 0.3–1.2)
Total Protein: 4.5 g/dL — ABNORMAL LOW (ref 6.5–8.1)

## 2020-04-23 LAB — HEMOGLOBIN AND HEMATOCRIT, BLOOD
HCT: 24.7 % — ABNORMAL LOW (ref 39.0–52.0)
Hemoglobin: 8.2 g/dL — ABNORMAL LOW (ref 13.0–17.0)

## 2020-04-23 LAB — CBC
HCT: 29.2 % — ABNORMAL LOW (ref 39.0–52.0)
Hemoglobin: 9.5 g/dL — ABNORMAL LOW (ref 13.0–17.0)
MCH: 29.1 pg (ref 26.0–34.0)
MCHC: 32.5 g/dL (ref 30.0–36.0)
MCV: 89.6 fL (ref 80.0–100.0)
Platelets: 134 10*3/uL — ABNORMAL LOW (ref 150–400)
RBC: 3.26 MIL/uL — ABNORMAL LOW (ref 4.22–5.81)
RDW: 13.7 % (ref 11.5–15.5)
WBC: 6.5 10*3/uL (ref 4.0–10.5)
nRBC: 0 % (ref 0.0–0.2)

## 2020-04-23 LAB — MAGNESIUM: Magnesium: 1.6 mg/dL — ABNORMAL LOW (ref 1.7–2.4)

## 2020-04-23 LAB — ABO/RH: ABO/RH(D): A POS

## 2020-04-23 LAB — PATHOLOGIST SMEAR REVIEW

## 2020-04-23 MED ORDER — MIDAZOLAM HCL 2 MG/2ML IJ SOLN
INTRAMUSCULAR | Status: AC
  Filled 2020-04-23: qty 2

## 2020-04-23 MED ORDER — MIDAZOLAM HCL 2 MG/2ML IJ SOLN
INTRAMUSCULAR | Status: AC | PRN
  Administered 2020-04-23: 0.5 mg via INTRAVENOUS
  Administered 2020-04-23: 1 mg via INTRAVENOUS

## 2020-04-23 MED ORDER — FENTANYL CITRATE (PF) 100 MCG/2ML IJ SOLN
INTRAMUSCULAR | Status: AC | PRN
Start: 2020-04-23 — End: 2020-04-23
  Administered 2020-04-23: 50 ug via INTRAVENOUS
  Administered 2020-04-23: 25 ug via INTRAVENOUS

## 2020-04-23 MED ORDER — LIDOCAINE HCL (PF) 1 % IJ SOLN
INTRAMUSCULAR | Status: AC
  Filled 2020-04-23: qty 30

## 2020-04-23 MED ORDER — FENTANYL CITRATE (PF) 100 MCG/2ML IJ SOLN
INTRAMUSCULAR | Status: AC
  Filled 2020-04-23: qty 2

## 2020-04-23 MED ORDER — GELATIN ABSORBABLE 12-7 MM EX MISC
CUTANEOUS | Status: AC
  Filled 2020-04-23: qty 1

## 2020-04-23 MED ORDER — SODIUM CHLORIDE 0.9 % IV BOLUS
1000.0000 mL | Freq: Once | INTRAVENOUS | Status: AC
  Administered 2020-04-23: 1000 mL via INTRAVENOUS

## 2020-04-23 MED ORDER — MAGNESIUM SULFATE 2 GM/50ML IV SOLN
2.0000 g | Freq: Once | INTRAVENOUS | Status: AC
  Administered 2020-04-23: 2 g via INTRAVENOUS
  Filled 2020-04-23: qty 50

## 2020-04-23 NOTE — Progress Notes (Signed)
Patient refusing CPAP at this time. RT will continue to monitor.

## 2020-04-23 NOTE — Progress Notes (Signed)
At 1738Call placed to Dr [place] informed that pt back from biopsy, pt BP low first checked 77/48 R arm, rechecked in left arm 81/44. has some pain at biopsy site, 5/10 pt states, tylenol given. BP higher before and during biopsy. had fentanyl and versed during procedure.  Pt very alert wanting to drink and eat something.   Also placed page to radiology number [phone], spoke to "[place]" with radiology. Informed her of the above and the order to notify Radiology on call if SBP less than 100 for two consecutive readings. Rhonda paged Dr Serafina Royals to nurses number.  At 1750 spoke with  Dr Serafina Royals, informed of the above. He's coming to see pt at bedside.   At [place] Dr [place] at pt's bedside. BP rechecked 84/38  At 1758 Dr Serafina Royals here at bedside, informed of Dr [place] ordering bolus of NS.  MDs to talk with each other and write further orders.

## 2020-04-23 NOTE — Plan of Care (Signed)
  Problem: Skin Integrity: Goal: Risk for impaired skin integrity will decrease Outcome: Progressing   Problem: Safety: Goal: Ability to remain free from injury will improve Outcome: Progressing   Problem: Pain Managment: Goal: General experience of comfort will improve Outcome: Progressing   

## 2020-04-23 NOTE — Procedures (Signed)
Interventional Radiology Procedure Note  Procedure: Ultrasound guided liver mass biopsy  Findings: Please refer to procedural dictation for full description. 18 ga x3 core bx of right liver mass. [place] slurry needle track embolization.  Complications: None immediate  Estimated Blood Loss: <10 mL  Recommendations: Strict 3 hours bedrest. CBC in AM. Follow up Pathology results.   Ruthann Cancer, MD

## 2020-04-23 NOTE — Progress Notes (Addendum)
PROGRESS NOTE    FORTUNE BRANNIGAN  GGY:[id number] DOB: May 26, 1946 DOA: 04/20/2020 PCP: Christain Sacramento, MD    Brief Narrative:  John [name] is a 74 year old male with past medical history notable for essential hypertension, OSA on CPAP who presented to the ED with complaints of weakness and constipation over the past 2-3 weeks.  Was seen by his PCP and given laxatives; which assisted in resolution of his constipation.  Given persistent weakness, he proceeded back to his PCP with lab testing performed that showed critically high calcium levels and was then instructed to come to the emergency department.  Patient did not take any calcium supplementation at home.  His wife also reports that he is been slightly confused over this timeframe as well.  Denies fever/chills/night sweats, no nausea vomiting, shortness of breath or chest pain.  In the ED, creatinine 2.6 which is elevated from January 2020.  Calcium greater than 15, hemoglobin 12.6, platelets 147.  CT chest/abdomen/pelvis without contrast notable for anterior mediastinal mass and right lobe hypodensity.  Patient was started on 2 L NS bolus.  Hospitalist service consulted for admission for further evaluation and treatment.   Assessment & Plan:   Principal Problem:   Hypercalcemia Active Problems:   Essential hypertension   ARF (acute renal failure) (HCC)   Mediastinal mass   Liver mass  Addendum:  Received call from RN regarding low BP on return from IR following US guided liver biopsy. Pt sitting up in bed eating dinner and asymptomatic besides some localized discomfort surrounding biospy site. Repeat BP 84/38. Discussed with IR, Dr. Serafina Royals, there was some bleeding during procedure as liver mass was very vascular.  --repeat Hgb was 9.5, down from 10.5 on 11/14. --IVF bolus with NS x 1L --type and screen --Repeat hemoglobin at 2200; if continues to decline IR, Dr. Angela Adam recommends CT abd w/o contrast   Anterior mediastinal  mass Right hepatic hypodensity Patient presenting with progressive weakness and constipation over the past 2-3 weeks.  Reports 10 pound weight loss over the past month.  Was noted to have severely elevated calcium level at PCP visit.  CT chest/abdomen notable for large anterior mediastinal mass with right liver lobe hypodensity and extensive mesenteric and retroperitoneal adenopathy.  LDH elevated 416. AFP 2.0 (normal), CEA 1.9 (normal), CA 19-9 <2. Concern for lymphoma versus thymoma versus germinal cell tumor.  --Medical oncology, Dr. Burr Medico following, appreciate assistance --Pending ultrasound-guided liver biopsy today  Severe hypercalcemia Suspect hypercalcemia of malignancy given mediastinal/hepatic mass.  Not on calcium or vitamin D supplements.  Vitamin D 25 hydroxy 21.57 (low). Received 2L NS bolus followed by pamidronate 90 mg IV on 04/21/2020. --Ca >15, 13.1>12.1 --PTH-related peptide pending --PTH-intact: pending --continue NS at 150 mL/hr --Continue close monitoring of calcium level  Acute renal failure Creatinine 2.67 on admission, baseline 0.78 06/2018.  Ultrasound with 2.4 cm benign-appearing left renal cyst, otherwise normal appearance; no mass or hydronephrosis. FeNa 3.0%, consistent with intrinsic etiology. --Cr 2.67>2.61>2.74>2.75 --Holding home lisinopril  Anemia Thrombocytopenia Hemoglobin 10.9, platelets 126 on admission.  Suspect etiology from malignancy as above.  Iron 59, TIBC 286, ferritin 1266, folate 27.9, B12 497. --SPEP --Peripheral smear --continue to monitor CBC daily  Essential hypertension --Holding lisinopril as above for acute renal failure --Doxazosin 4 mg p.o. daily --Metoprolol tartrate 12.5 mg p.o. twice daily  OSA --Continue nocturnal CPAP  DVT prophylaxis: SCDs Code Status: Full code Family Communication: Discussed with spouse present at bedside  Disposition Plan:  Status is: Inpatient  Remains inpatient appropriate because:Persistent  severe electrolyte disturbances, Ongoing diagnostic testing needed not appropriate for outpatient work up, Unsafe d/c plan, IV treatments appropriate due to intensity of illness or inability to take PO and Inpatient level of care appropriate due to severity of illness   Dispo:  Patient From: Home  Planned Disposition: Home  Expected discharge date: 04/24/20  Medically stable for discharge: No   Consultants:   Medical oncology, Dr. Burr Medico  Interventional radiology  Procedures:   None  Antimicrobials:   None   Subjective: Patient seen and examined at bedside, resting comfortably lying in bed.  Spouse present at bedside.  Calcium down to 12.1 today.  Awaiting ultrasound guided biopsy of liver planed for today.  No other complaints or concerns at this time. Denies headache, no visual changes, no chest pain, no palpitations, no shortness of breath, no abdominal pain, no paresthesias.  No acute events overnight per nursing staff.  Objective: Vitals:   04/22/20 1753 04/22/20 2151 04/23/20 0536 04/23/20 0949  BP: (!) 150/65 (!) 144/65 (!) 144/70 134/65  Pulse: 75 74 81 75  Resp: 19 18 20 18   Temp: 98.2 F (36.8 C) 98.7 F (37.1 C) 98.6 F (37 C) 98.4 F (36.9 C)  TempSrc:  Oral Oral Oral  SpO2: 95% 94% 94% 93%  Weight:      Height:        Intake/Output Summary (Last 24 hours) at 04/23/2020 1154 Last data filed at 04/23/2020 0720 Gross per 24 hour  Intake 4036.7 ml  Output 1525 ml  Net 2511.7 ml   Filed Weights   04/21/20 0316 04/21/20 2142 04/22/20 0500  Weight: 94.8 kg 100.9 kg 100.9 kg    Examination:  General exam: Appears calm and comfortable  Respiratory system: Clear to auscultation. Respiratory effort normal.  Oxygenating well on room air Cardiovascular system: S1 & S2 heard, RRR. No JVD, murmurs, rubs, gallops or clicks.  Trace lower extremity edema bilaterally to mid shin Gastrointestinal system: Abdomen is nondistended, soft and nontender. No organomegaly  or masses felt. Normal bowel sounds heard. Central nervous system: Alert and oriented. No focal neurological deficits. Extremities: Symmetric 5 x 5 power. Skin: No rashes, lesions or ulcers Psychiatry: Judgement and insight appear normal. Mood & affect appropriate.     Data Reviewed: I have personally reviewed following labs and imaging studies  CBC: Recent Labs  Lab 04/20/20 1633 04/20/20 1818 04/21/20 0059 04/22/20 0210  WBC 6.4 5.8 4.3 4.0  NEUTROABS  --  4.4 3.2  --   HGB 12.6* 12.2* 10.9* 10.5*  HCT 38.6* 36.3* 31.6* 31.8*  MCV 87.9 87.3 86.6 86.4  PLT 147* 150 126* 016*   Basic Metabolic Panel: Recent Labs  Lab 04/20/20 1633 04/20/20 1818 04/21/20 0059 04/22/20 0210 04/23/20 0232  NA 136 130* 136 136 138  K 3.9 4.1 3.6 3.4* 3.9  CL 93* 91* 101 104 108  CO2 32 28 27 24  21*  GLUCOSE 94 91 89 86 79  BUN 40* 43* 41* 41* 42*  CREATININE 2.65* 2.67* 2.61* 2.74* 2.75*  CALCIUM >15.0* >15.0* >15.0* 13.1* 12.1*  MG  --  2.0  --   --  1.6*  PHOS  --   --  4.0  --   --    GFR: Estimated Creatinine Clearance: 29.4 mL/min (A) (by C-G formula based on SCr of 2.75 mg/dL (H)). Liver Function Tests: Recent Labs  Lab 04/20/20 1818 04/21/20 0059 04/22/20 0210 04/23/20 0232  AST 71* 54* 66* 72*  ALT 32 30 43 51*  ALKPHOS 93 77 95 127*  BILITOT 1.6* 1.5* 1.4* 1.4*  PROT 5.7* 5.1* 4.5* 4.5*  ALBUMIN 3.4* 2.9* 2.6* 2.5*   No results for input(s): LIPASE, AMYLASE in the last 168 hours. No results for input(s): AMMONIA in the last 168 hours. Coagulation Profile: Recent Labs  Lab 04/22/20 0210  INR 1.3*   Cardiac Enzymes: No results for input(s): CKTOTAL, CKMB, CKMBINDEX, TROPONINI in the last 168 hours. BNP (last 3 results) No results for input(s): PROBNP in the last 8760 hours. HbA1C: No results for input(s): HGBA1C in the last 72 hours. CBG: Recent Labs  Lab 04/20/20 1857  GLUCAP 92   Lipid Profile: No results for input(s): CHOL, HDL, LDLCALC, TRIG,  CHOLHDL, LDLDIRECT in the last 72 hours. Thyroid Function Tests: No results for input(s): TSH, T4TOTAL, FREET4, T3FREE, THYROIDAB in the last 72 hours. Anemia Panel: Recent Labs    04/21/20 0505  VITAMINB12 497  FOLATE 27.9  FERRITIN 1,266*  TIBC 286  IRON 59  RETICCTPCT 1.2   Sepsis Labs: No results for input(s): PROCALCITON, LATICACIDVEN in the last 168 hours.  Recent Results (from the past 240 hour(s))  Respiratory Panel by RT PCR (Flu A&B, Covid) - Nasopharyngeal Swab     Status: None   Collection Time: 04/20/20  6:25 PM   Specimen: Nasopharyngeal Swab  Result Value Ref Range Status   SARS Coronavirus 2 by RT PCR NEGATIVE NEGATIVE Final    Comment: (NOTE) SARS-CoV-2 target nucleic acids are NOT DETECTED.  The SARS-CoV-2 RNA is generally detectable in upper respiratoy specimens during the acute phase of infection. The lowest concentration of SARS-CoV-2 viral copies this assay can detect is 131 copies/mL. A negative result does not preclude SARS-Cov-2 infection and should not be used as the sole basis for treatment or other patient management decisions. A negative result may occur with  improper specimen collection/handling, submission of specimen other than nasopharyngeal swab, presence of viral mutation(s) within the areas targeted by this assay, and inadequate number of viral copies (<131 copies/mL). A negative result must be combined with clinical observations, patient history, and epidemiological information. The expected result is Negative.  Fact Sheet for Patients:  PinkCheek.be  Fact Sheet for Healthcare Providers:  GravelBags.it  This test is no t yet approved or cleared by the [place] FDA and  has been authorized for detection and/or diagnosis of SARS-CoV-2 by FDA under an Emergency Use Authorization (EUA). This EUA will remain  in effect (meaning this test can be used) for the duration of  the COVID-19 declaration under Section 564(b)(1) of the Act, 21 U.S.C. section 360bbb-3(b)(1), unless the authorization is terminated or revoked sooner.     Influenza A by PCR NEGATIVE NEGATIVE Final   Influenza B by PCR NEGATIVE NEGATIVE Final    Comment: (NOTE) The Xpert Xpress SARS-CoV-2/FLU/RSV assay is intended as an aid in  the diagnosis of influenza from Nasopharyngeal swab specimens and  should not be used as a sole basis for treatment. Nasal washings and  aspirates are unacceptable for Xpert Xpress SARS-CoV-2/FLU/RSV  testing.  Fact Sheet for Patients: PinkCheek.be  Fact Sheet for Healthcare Providers: GravelBags.it  This test is not yet approved or cleared by the [place] FDA and  has been authorized for detection and/or diagnosis of SARS-CoV-2 by  FDA under an Emergency Use Authorization (EUA). This EUA will remain  in effect (meaning this test can be used) for the duration of the  Covid-19 declaration under Section  564(b)(1) of the Act, 21  U.S.C. section 360bbb-3(b)(1), unless the authorization is  terminated or revoked. Performed at [facility], [place] [address] [place], [place] [address]          Radiology Studies: US RENAL  Result Date: 04/22/2020 CLINICAL DATA:  Acute renal failure. EXAM: RENAL / URINARY TRACT ULTRASOUND COMPLETE COMPARISON:  CT 04/20/2020 FINDINGS: Right Kidney: Renal measurements: 12.7 x 5.4 x 5.5 cm = volume: 195 mL. Echogenicity within normal limits. No mass or hydronephrosis visualized. Left Kidney: Renal measurements: 12.9 x 5.9 x 5.0 cm = volume: 198 mL. Echogenicity within normal limits. No hydronephrosis visualized. Benign-appearing cyst measures 2.4 cm in the upper pole of the left kidney. Bladder: Appears normal for degree of bladder distention. Prevoid volume is 71 cc. Other: None. IMPRESSION: 1. 2.4 cm benign-appearing left renal cyst. 2. Otherwise normal  appearance of the kidneys and bladder. Electronically Signed   By: Fidela Salisbury M.D.   On: 04/22/2020 10:30        Scheduled Meds: . doxazosin  4 mg Oral Daily  . fluticasone  2 spray Each Nare Daily  . metoprolol tartrate  12.5 mg Oral BID   Continuous Infusions:    LOS: 2 days    Time spent: 34 minutes spent on chart review, discussion with nursing staff, consultants, updating family and interview/physical exam; more than 50% of that time was spent in counseling and/or coordination of care.    Alverta Caccamo J [place] DO [facility] Available via Epic secure chat 7am-7pm After these hours, please refer to coverage provider listed on [email or web address] 04/23/2020, 11:54 AM

## 2020-04-24 DIAGNOSIS — E883 Tumor lysis syndrome: Secondary | ICD-10-CM | POA: Diagnosis present

## 2020-04-24 LAB — CBC
HCT: 24.8 % — ABNORMAL LOW (ref 39.0–52.0)
Hemoglobin: 8.3 g/dL — ABNORMAL LOW (ref 13.0–17.0)
MCH: 29 pg (ref 26.0–34.0)
MCHC: 33.5 g/dL (ref 30.0–36.0)
MCV: 86.7 fL (ref 80.0–100.0)
Platelets: 129 10*3/uL — ABNORMAL LOW (ref 150–400)
RBC: 2.86 MIL/uL — ABNORMAL LOW (ref 4.22–5.81)
RDW: 13.4 % (ref 11.5–15.5)
WBC: 5 10*3/uL (ref 4.0–10.5)
nRBC: 0 % (ref 0.0–0.2)

## 2020-04-24 LAB — COMPREHENSIVE METABOLIC PANEL
ALT: 64 U/L — ABNORMAL HIGH (ref 0–44)
AST: 106 U/L — ABNORMAL HIGH (ref 15–41)
Albumin: 2.4 g/dL — ABNORMAL LOW (ref 3.5–5.0)
Alkaline Phosphatase: 99 U/L (ref 38–126)
Anion gap: 8 (ref 5–15)
BUN: 47 mg/dL — ABNORMAL HIGH (ref 8–23)
CO2: 21 mmol/L — ABNORMAL LOW (ref 22–32)
Calcium: 11.3 mg/dL — ABNORMAL HIGH (ref 8.9–10.3)
Chloride: 109 mmol/L (ref 98–111)
Creatinine, Ser: 2.98 mg/dL — ABNORMAL HIGH (ref 0.61–1.24)
GFR, Estimated: 22 mL/min — ABNORMAL LOW (ref >60)
Glucose, Bld: 87 mg/dL (ref 70–99)
Potassium: 3.8 mmol/L (ref 3.5–5.1)
Sodium: 138 mmol/L (ref 135–145)
Total Bilirubin: 1.1 mg/dL (ref 0.3–1.2)
Total Protein: 4.2 g/dL — ABNORMAL LOW (ref 6.5–8.1)

## 2020-04-24 LAB — URIC ACID: Uric Acid, Serum: 11.9 mg/dL — ABNORMAL HIGH (ref 3.7–8.6)

## 2020-04-24 LAB — PROTEIN ELECTROPHORESIS, SERUM
A/G Ratio: 1.3 (ref 0.7–1.7)
Albumin ELP: 2.9 g/dL (ref 2.9–4.4)
Alpha-1-Globulin: 0.3 g/dL (ref 0.0–0.4)
Alpha-2-Globulin: 0.6 g/dL (ref 0.4–1.0)
Beta Globulin: 0.8 g/dL (ref 0.7–1.3)
Gamma Globulin: 0.5 g/dL (ref 0.4–1.8)
Globulin, Total: 2.2 g/dL (ref 2.2–3.9)
Total Protein ELP: 5.1 g/dL — ABNORMAL LOW (ref 6.0–8.5)

## 2020-04-24 LAB — HEMOGLOBIN AND HEMATOCRIT, BLOOD
HCT: 24.2 % — ABNORMAL LOW (ref 39.0–52.0)
Hemoglobin: 8.3 g/dL — ABNORMAL LOW (ref 13.0–17.0)

## 2020-04-24 LAB — MAGNESIUM: Magnesium: 1.9 mg/dL (ref 1.7–2.4)

## 2020-04-24 LAB — PARATHYROID HORMONE, INTACT (NO CA)

## 2020-04-24 MED ORDER — SODIUM CHLORIDE 0.9 % IV SOLN: INTRAVENOUS | Status: DC

## 2020-04-24 MED ORDER — SODIUM CHLORIDE 0.9 % IV SOLN
6.0000 mg | Freq: Once | INTRAVENOUS | Status: AC
  Administered 2020-04-24: 6 mg via INTRAVENOUS
  Filled 2020-04-24: qty 4

## 2020-04-24 NOTE — Progress Notes (Addendum)
John [name]   DOB:Feb 23, 1946   BM#:[id number]   UUV#:[id number]  Oncology follow up   Subjective: Pt underwent liver biopsy yesterday and had a brief episode of hypotension and drop of H/H. He state he has some pain at biopsy site but not bad. He is eager to go home. Wife at bedside.    Objective:  Vitals:   04/24/20 0853 04/24/20 0949  BP: (!) 132/59 (!) 103/46  Pulse: (!) 113 99  Resp: 18 (!) 24  Temp:  99.2 F (37.3 C)  SpO2: 96% 94%    Body mass index is 31.02 kg/m.  Intake/Output Summary (Last 24 hours) at 04/24/2020 1311 Last data filed at 04/24/2020 1103 Gross per 24 hour  Intake 320 ml  Output 551 ml  Net -231 ml     Sclerae unicteric  Oropharynx clear  No peripheral adenopathy  Lungs clear -- no rales or rhonchi  Heart regular rate and rhythm  Abdomen soft, mild tenderness at RUQ  MSK no focal spinal tenderness, no peripheral edema  Neuro nonfocal    CBG (last 3)  No results for input(s): GLUCAP in the last 72 hours.   Labs:  Urine Studies No results for input(s): UHGB, CRYS in the last 72 hours.  Invalid input(s): UACOL, UAPR, USPG, UPH, UTP, UGL, UKET, UBIL, UNIT, UROB, ULEU, UEPI, UWBC, [place], [place], [place], [place], [place]  Basic Metabolic Panel: Recent Labs  Lab 04/20/20 1818 04/20/20 1818 04/21/20 0059 04/21/20 0059 04/22/20 0210 04/22/20 0210 04/23/20 0232 04/24/20 0329  NA 130*  --  136  --  136  --  138 138  K 4.1   < > 3.6   < > 3.4*   < > 3.9 3.8  CL 91*  --  101  --  104  --  108 109  CO2 28  --  27  --  24  --  21* 21*  GLUCOSE 91  --  89  --  86  --  79 87  BUN 43*  --  41*  --  41*  --  42* 47*  CREATININE 2.67*  --  2.61*  --  2.74*  --  2.75* 2.98*  CALCIUM >15.0*  --  >15.0*  --  13.1*  --  12.1* 11.3*  MG 2.0  --   --   --   --   --  1.6* 1.9  PHOS  --   --  4.0  --   --   --   --   --    < > = values in this interval not displayed.   GFR Estimated Creatinine Clearance: 27.1 mL/min (A) (by C-G formula based on SCr of 2.98  mg/dL (H)). Liver Function Tests: Recent Labs  Lab 04/20/20 1818 04/21/20 0059 04/22/20 0210 04/23/20 0232 04/24/20 0329  AST 71* 54* 66* 72* 106*  ALT 32 30 43 51* 64*  ALKPHOS 93 77 95 127* 99  BILITOT 1.6* 1.5* 1.4* 1.4* 1.1  PROT 5.7* 5.1* 4.5* 4.5* 4.2*  ALBUMIN 3.4* 2.9* 2.6* 2.5* 2.4*   No results for input(s): LIPASE, AMYLASE in the last 168 hours. No results for input(s): AMMONIA in the last 168 hours. Coagulation profile Recent Labs  Lab 04/22/20 0210  INR 1.3*    CBC: Recent Labs  Lab 04/20/20 1818 04/20/20 1818 04/21/20 0059 04/21/20 0059 04/22/20 0210 04/23/20 1801 04/23/20 2154 04/24/20 0329 04/24/20 1209  WBC 5.8  --  4.3  --  4.0 6.5  --  5.0  --   NEUTROABS 4.4  --  3.2  --   --   --   --   --   --   HGB 12.2*   < > 10.9*   < > 10.5* 9.5* 8.2* 8.3* 8.3*  HCT 36.3*   < > 31.6*   < > 31.8* 29.2* 24.7* 24.8* 24.2*  MCV 87.3  --  86.6  --  86.4 89.6  --  86.7  --   PLT 150  --  126*  --  112* 134*  --  129*  --    < > = values in this interval not displayed.   Cardiac Enzymes: No results for input(s): CKTOTAL, CKMB, CKMBINDEX, TROPONINI in the last 168 hours. BNP: Invalid input(s): POCBNP CBG: Recent Labs  Lab 04/20/20 1857  GLUCAP 92   D-Dimer No results for input(s): DDIMER in the last 72 hours. Hgb A1c No results for input(s): HGBA1C in the last 72 hours. Lipid Profile No results for input(s): CHOL, HDL, LDLCALC, TRIG, CHOLHDL, LDLDIRECT in the last 72 hours. Thyroid function studies No results for input(s): TSH, T4TOTAL, T3FREE, THYROIDAB in the last 72 hours.  Invalid input(s): FREET3 Anemia work up No results for input(s): VITAMINB12, FOLATE, FERRITIN, TIBC, IRON, RETICCTPCT in the last 72 hours. Microbiology Recent Results (from the past 240 hour(s))  Respiratory Panel by RT PCR (Flu A&B, Covid) - Nasopharyngeal Swab     Status: None   Collection Time: 04/20/20  6:25 PM   Specimen: Nasopharyngeal Swab  Result Value Ref Range  Status   SARS Coronavirus 2 by RT PCR NEGATIVE NEGATIVE Final    Comment: (NOTE) SARS-CoV-2 target nucleic acids are NOT DETECTED.  The SARS-CoV-2 RNA is generally detectable in upper respiratoy specimens during the acute phase of infection. The lowest concentration of SARS-CoV-2 viral copies this assay can detect is 131 copies/mL. A negative result does not preclude SARS-Cov-2 infection and should not be used as the sole basis for treatment or other patient management decisions. A negative result may occur with  improper specimen collection/handling, submission of specimen other than nasopharyngeal swab, presence of viral mutation(s) within the areas targeted by this assay, and inadequate number of viral copies (<131 copies/mL). A negative result must be combined with clinical observations, patient history, and epidemiological information. The expected result is Negative.  Fact Sheet for Patients:  PinkCheek.be  Fact Sheet for Healthcare Providers:  GravelBags.it  This test is no t yet approved or cleared by the [place] FDA and  has been authorized for detection and/or diagnosis of SARS-CoV-2 by FDA under an Emergency Use Authorization (EUA). This EUA will remain  in effect (meaning this test can be used) for the duration of the COVID-19 declaration under Section 564(b)(1) of the Act, 21 U.S.C. section 360bbb-3(b)(1), unless the authorization is terminated or revoked sooner.     Influenza A by PCR NEGATIVE NEGATIVE Final   Influenza B by PCR NEGATIVE NEGATIVE Final    Comment: (NOTE) The Xpert Xpress SARS-CoV-2/FLU/RSV assay is intended as an aid in  the diagnosis of influenza from Nasopharyngeal swab specimens and  should not be used as a sole basis for treatment. Nasal washings and  aspirates are unacceptable for Xpert Xpress SARS-CoV-2/FLU/RSV  testing.  Fact Sheet for  Patients: PinkCheek.be  Fact Sheet for Healthcare Providers: GravelBags.it  This test is not yet approved or cleared by the [place] FDA and  has been authorized for detection and/or diagnosis of SARS-CoV-2 by  FDA under an Emergency Use Authorization (EUA). This EUA will remain  in effect (meaning this test can be used) for the duration of the  Covid-19 declaration under Section 564(b)(1) of the Act, 21  U.S.C. section 360bbb-3(b)(1), unless the authorization is  terminated or revoked. Performed at [facility], [place] [address] [place], [place] [address]       Studies:  US BIOPSY (LIVER)  Result Date: 04/23/2020 INDICATION: 74 year old male with indeterminate right lobe up attic mass. EXAM: ULTRASOUND GUIDED LIVER LESION BIOPSY COMPARISON:  None. MEDICATIONS: None ANESTHESIA/SEDATION: Fentanyl 75 mcg IV; Versed 1.5 mg IV Total Moderate Sedation time:  13 minutes. The patient's level of consciousness and vital signs were monitored continuously by radiology nursing throughout the procedure under my direct supervision. COMPLICATIONS: None immediate. PROCEDURE: Informed written consent was obtained from the patient after a discussion of the risks, benefits and alternatives to treatment. The patient understands and consents the procedure. A timeout was performed prior to the initiation of the procedure. Ultrasound scanning was performed of the right upper abdominal quadrant demonstrates heterogeneously hypoechoic well-defined mass in the right lobe measuring up to approximately 9 cm. The right lobe mass was selected for biopsy and the procedure was planned. The right upper abdominal quadrant was prepped and draped in the usual sterile fashion. The overlying soft tissues were anesthetized with 1% lidocaine with epinephrine. A 17 gauge, 6.8 cm co-axial needle was advanced into a peripheral aspect of the lesion. This was followed by  3 core biopsies with an 18 gauge core device under direct ultrasound guidance. The coaxial needle tract was embolized with a small amount of Gel-Foam slurry and superficial hemostasis was obtained with manual compression. Post procedural scanning was negative for definitive area of hemorrhage or additional complication. A dressing was placed. The patient tolerated the procedure well without immediate post procedural complication. IMPRESSION: Technically successful ultrasound guided core needle biopsy of right lobe hepatic mass. Ruthann Cancer, MD [facility] Electronically Signed   By: Ruthann Cancer MD   On: 04/23/2020 17:40    Assessment: 74 y.o. Caucasian male with past medical history of hypertension and BPH presented with severe hypocalcemia, large mediastinal and liver mass, and extensive retroperitoneal adenopathy.  1.  Severe hypercalcemia, likely malignancy related, much improved  2.  Large mediastinal and liver mass, mesenteric and retroperitoneal lymphadenopathy, concerning for malignancy, s/p liver biopsy 11/15 3. HTN 4. Mild anemia and thrombocytopenia 5. AKI    Recommendations: -her liver biopsy result is still pending, will be reviewed by pathologist tomorrow morning and it may take a few days to finalize -due to persistent AKI (not sure if he has component of CKD, Cr normal one year before this admission), I added uric acid today to rule out tumor lysis syndrome  -pt is being f/u by IR to rule out liver hematoma after biopsy, CT pending  -Pt is eager to go home. I will set up his f/u with me or my partner (depends on the final diagnosis) this week or early next week     Truitt Merle, MD 04/24/2020  1:11 PM   Addendum  Uric acid came back high, 11.9. 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 I ordered rasburicase 6 mg once, and spoke with [place].  They will get the medicine from [facility], it should  arrive in 2 to 3 hours.  Patient also need IV fluids NS at least 100cc/hr, and monitor uric acid and renal  function daily for next few days until resolves. I informed pt's primary care team Dr. [place] about this. Please keep pt here for a few more days.   Truitt Merle  04/24/2020 1:34 PM

## 2020-04-24 NOTE — Progress Notes (Signed)
Visit made to patients room to offer CPAP.  Patient states he don't want to wear why in the hospital due to to much going on.  I advised patient if he changes his mind to have RN call RT and we will set him up.

## 2020-04-24 NOTE — Progress Notes (Signed)
Referring Physician(s): [place] Donnamarie Poag [facility])  Supervising Physician: Ruthann Cancer  Patient Status:  [facility] - In-pt  Chief Complaint: "Pain at biopsy site"  Subjective:  History of liver mass s/p liver mass biopsy in IR 35/00/9381; complicated by hypotension and decrease in hgb post-procedure. Patient awake and alert sitting in bed eating breakfast. Wife at bedside. Complains of pain at liver biopsy site with inspiration, rated 5/10 at this time. Denies N/V associated with pain. RUQ liver biopsy site c/d/i.   Allergies: Patient has no known allergies.  Medications: Prior to Admission medications   Medication Sig Start Date End Date Taking? Authorizing Provider  Ascorbic Acid (VITAMIN C WITH ROSE HIPS) 1000 MG tablet Take 1,000 mg by mouth daily.   Yes [provider]  aspirin EC 81 MG tablet Take 81 mg by mouth daily.   Yes [provider]  benazepril (LOTENSIN) 10 MG tablet Take 10 mg by mouth daily.   Yes [provider]  Calcium Citrate-Vitamin D (CALCIUM CITRATE + D PO) Take 1 tablet by mouth daily.   Yes [provider]  clobetasol ointment (TEMOVATE) 8.29 % Apply 1 application topically 2 (two) times daily.   Yes [provider]  docusate sodium (COLACE) 100 MG capsule Take 100 mg by mouth 2 (two) times daily as needed for mild constipation. 04/14/20  Yes [provider]  doxazosin (CARDURA) 4 MG tablet Take 4 mg by mouth daily.   Yes [provider]  metoprolol tartrate (LOPRESSOR) 25 MG tablet Take 0.5 tablets (12.5 mg total) by mouth 2 (two) times daily. 06/09/18  Yes Lajean Saver, MD  Multiple Vitamins-Minerals (MULTIVITAMIN PO) Take 1 tablet by mouth daily.   Yes [provider]  Omega-3 Fatty Acids (OMEGA 3 PO) Take 1 tablet by mouth daily.   Yes [provider]  polyethylene glycol powder (GLYCOLAX/MIRALAX) 17 GM/SCOOP powder Take 17 g by mouth daily. 04/14/20  Yes [provider]    Specialty Vitamins Products (PROSTATE PO) Take 2 tablets by mouth daily.   Yes [provider]  triamcinolone cream (KENALOG) 0.1 % Apply 1 application topically 3 (three) times daily as needed for rash. 04/02/20  Yes [provider]     Vital Signs: BP (!) 132/59 (BP Location: Right Arm)   Pulse (!) 113   Temp 97.8 F (36.6 C) (Oral)   Resp 18   Ht 5\' 11"  (1.803 m)   Wt 222 lb 7.1 oz (100.9 kg)   SpO2 96%   BMI 31.02 kg/m   Physical Exam Vitals and nursing note reviewed.  Constitutional:      General: He is not in acute distress.    Appearance: Normal appearance.  Cardiovascular:     Rate and Rhythm: Normal rate and regular rhythm.     Heart sounds: Normal heart sounds. No murmur heard.   Pulmonary:     Effort: Pulmonary effort is normal. No respiratory distress.     Breath sounds: Normal breath sounds. No wheezing.  Abdominal:     Palpations: Abdomen is soft.     Tenderness: There is no guarding.     Comments: RUQ liver biopsy site soft with mild tenderness, no erythema, drainage, active bleeding, or hematoma noted.  Skin:    General: Skin is warm and dry.  Neurological:     Mental Status: He is alert and oriented to person, place, and time.     Imaging: CT ABDOMEN PELVIS WO CONTRAST  Result Date: 04/20/2020 CLINICAL  DATA:  74 year old male with nausea vomiting. Epigastric pain. EXAM: CT CHEST, ABDOMEN AND PELVIS WITHOUT CONTRAST TECHNIQUE: Multidetector CT imaging of the chest, abdomen and pelvis was performed following the standard protocol without IV contrast. COMPARISON:  None. FINDINGS: Evaluation of this exam is limited in the absence of intravenous contrast. CT CHEST FINDINGS Cardiovascular: There is no cardiomegaly or pericardial effusion. Coronary vascular calcification. There is moderate atherosclerotic calcification of the thoracic aorta. The central pulmonary arteries are grossly unremarkable on this noncontrast CT. Mediastinum/Nodes:  Large anterior mediastinal/prevascular space mass/adenopathy abutting the anterior aspect of the aortic arch measuring 6.3 x 4.0 cm in greatest axial dimensions. There is superior extension of the mass into the upper mediastinum. Additional 2.7 x 2.2 cm nodular density in the region of the left hilum may represent portion of the mass or new adenopathy. The esophagus is grossly unremarkable. No mediastinal fluid collection. Lungs/Pleura: The lungs are clear. There is no pleural effusion pneumothorax. The central airways are patent. Musculoskeletal: Degenerative changes of the spine. No acute osseous pathology. CT ABDOMEN PELVIS FINDINGS No intra-abdominal free air or free fluid. Hepatobiliary: There is an 8.6 x 8.2 cm low attenuating mass in the right lobe of the liver which is not characterized on this CT. Further characterization with MRI without and with contrast on a nonemergent/outpatient basis recommended. No intrahepatic biliary ductal dilatation. Probable sludge within the gallbladder. No calcified stone or pericholecystic fluid. Pancreas: No acute inflammation. No pancreatic atrophy or dilatation of the main pancreatic duct Spleen: Indeterminate and ill-defined hypodense lesions within the spleen. There is slight irregularity of the splenic contour. Adrenals/Urinary Tract: Mild thickening of the adrenal glands. There is no hydronephrosis or nephrolithiasis on either side. A 15 mm left renal upper pole hypodense lesion, likely a cyst. The visualized ureters and urinary bladder appear unremarkable. Stomach/Bowel: There is sigmoid diverticulosis without active inflammatory changes. There is no bowel obstruction or active inflammation. The appendix is normal. Vascular/Lymphatic: Advanced aortoiliac atherosclerotic disease. The IVC is unremarkable. No portal venous gas. Extensive mesenteric and retroperitoneal adenopathy. There is a 6.3 x 6.9 cm soft tissue mass in the mesentery abutting the inferior aspect of  the uncinate process of the pancreas. Additional soft tissue mass/adenopathy medial to the ascending colon. Reproductive: Mild prostate enlargement. Other: None Musculoskeletal: Degenerative changes of the spine. 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 No acute osseous pathology. IMPRESSION: 1. Large anterior mediastinal/prevascular space mass/adenopathy. The mass extends superiorly and abuts the upper trachea. 2. Large hypodense lesion in the right lobe of the liver as well as ill-defined splenic hypodense lesions. Further characterization with MRI without and with contrast recommended. The liver lesion is amenable to percutaneous tissue sampling. 3. Extensive mesenteric and retroperitoneal adenopathy. 4. Sigmoid diverticulosis. No bowel obstruction. Normal appendix. 5. Aortic Atherosclerosis (ICD10-I70.0). Electronically Signed   By: Anner Crete M.D.   On: 04/20/2020 21:11   CT Chest Wo Contrast  Result Date: 04/20/2020 CLINICAL DATA:  74 year old male with nausea vomiting. Epigastric pain. EXAM: CT CHEST, ABDOMEN AND PELVIS WITHOUT CONTRAST TECHNIQUE: Multidetector CT imaging of the chest, abdomen and pelvis was performed following the standard protocol without IV contrast. COMPARISON:  None. FINDINGS: Evaluation of this exam is limited in the absence of intravenous contrast. CT CHEST FINDINGS Cardiovascular: There is no cardiomegaly or pericardial effusion. Coronary vascular calcification. There is moderate atherosclerotic calcification of the thoracic aorta. The central pulmonary arteries are grossly unremarkable on this noncontrast CT. Mediastinum/Nodes: Large anterior mediastinal/prevascular space mass/adenopathy abutting the anterior  aspect of the aortic arch measuring 6.3 x 4.0 cm in greatest axial dimensions. There is superior extension of the mass into the upper mediastinum. Additional 2.7 x 2.2 cm nodular density in the region of the left hilum may represent portion of the  mass or new adenopathy. The esophagus is grossly unremarkable. No mediastinal fluid collection. Lungs/Pleura: The lungs are clear. There is no pleural effusion pneumothorax. The central airways are patent. Musculoskeletal: Degenerative changes of the spine. No acute osseous pathology. CT ABDOMEN PELVIS FINDINGS No intra-abdominal free air or free fluid. Hepatobiliary: There is an 8.6 x 8.2 cm low attenuating mass in the right lobe of the liver which is not characterized on this CT. Further characterization with MRI without and with contrast on a nonemergent/outpatient basis recommended. No intrahepatic biliary ductal dilatation. Probable sludge within the gallbladder. No calcified stone or pericholecystic fluid. Pancreas: No acute inflammation. No pancreatic atrophy or dilatation of the main pancreatic duct Spleen: Indeterminate and ill-defined hypodense lesions within the spleen. There is slight irregularity of the splenic contour. Adrenals/Urinary Tract: Mild thickening of the adrenal glands. There is no hydronephrosis or nephrolithiasis on either side. A 15 mm left renal upper pole hypodense lesion, likely a cyst. The visualized ureters and urinary bladder appear unremarkable. Stomach/Bowel: There is sigmoid diverticulosis without active inflammatory changes. There is no bowel obstruction or active inflammation. The appendix is normal. Vascular/Lymphatic: Advanced aortoiliac atherosclerotic disease. The IVC is unremarkable. No portal venous gas. Extensive mesenteric and retroperitoneal adenopathy. There is a 6.3 x 6.9 cm soft tissue mass in the mesentery abutting the inferior aspect of the uncinate process of the pancreas. Additional soft tissue mass/adenopathy medial to the ascending colon. Reproductive: Mild prostate enlargement. Other: None Musculoskeletal: Degenerative changes of the spine. 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 No acute osseous pathology. IMPRESSION: 1. Large anterior  mediastinal/prevascular space mass/adenopathy. The mass extends superiorly and abuts the upper trachea. 2. Large hypodense lesion in the right lobe of the liver as well as ill-defined splenic hypodense lesions. Further characterization with MRI without and with contrast recommended. The liver lesion is amenable to percutaneous tissue sampling. 3. Extensive mesenteric and retroperitoneal adenopathy. 4. Sigmoid diverticulosis. No bowel obstruction. Normal appendix. 5. Aortic Atherosclerosis (ICD10-I70.0). Electronically Signed   By: Anner Crete M.D.   On: 04/20/2020 21:11   US RENAL  Result Date: 04/22/2020 CLINICAL DATA:  Acute renal failure. EXAM: RENAL / URINARY TRACT ULTRASOUND COMPLETE COMPARISON:  CT 04/20/2020 FINDINGS: Right Kidney: Renal measurements: 12.7 x 5.4 x 5.5 cm = volume: 195 mL. Echogenicity within normal limits. No mass or hydronephrosis visualized. Left Kidney: Renal measurements: 12.9 x 5.9 x 5.0 cm = volume: 198 mL. Echogenicity within normal limits. No hydronephrosis visualized. Benign-appearing cyst measures 2.4 cm in the upper pole of the left kidney. Bladder: Appears normal for degree of bladder distention. Prevoid volume is 71 cc. Other: None. IMPRESSION: 1. 2.4 cm benign-appearing left renal cyst. 2. Otherwise normal appearance of the kidneys and bladder. Electronically Signed   By: Fidela Salisbury M.D.   On: 04/22/2020 10:30   US BIOPSY (LIVER)  Result Date: 04/23/2020 INDICATION: 74 year old male with indeterminate right lobe up attic mass. EXAM: ULTRASOUND GUIDED LIVER LESION BIOPSY COMPARISON:  None. MEDICATIONS: None ANESTHESIA/SEDATION: Fentanyl 75 mcg IV; Versed 1.5 mg IV Total Moderate Sedation time:  13 minutes. The patient's level of consciousness and vital signs were monitored continuously by radiology nursing throughout the procedure under my direct supervision. COMPLICATIONS: None immediate. PROCEDURE: Informed  written consent was obtained from the patient  after a discussion of the risks, benefits and alternatives to treatment. The patient understands and consents the procedure. A timeout was performed prior to the initiation of the procedure. Ultrasound scanning was performed of the right upper abdominal quadrant demonstrates heterogeneously hypoechoic well-defined mass in the right lobe measuring up to approximately 9 cm. The right lobe mass was selected for biopsy and the procedure was planned. The right upper abdominal quadrant was prepped and draped in the usual sterile fashion. The overlying soft tissues were anesthetized with 1% lidocaine with epinephrine. A 17 gauge, 6.8 cm co-axial needle was advanced into a peripheral aspect of the lesion. This was followed by 3 core biopsies with an 18 gauge core device under direct ultrasound guidance. The coaxial needle tract was embolized with a small amount of Gel-Foam slurry and superficial hemostasis was obtained with manual compression. Post procedural scanning was negative for definitive area of hemorrhage or additional complication. A dressing was placed. The patient tolerated the procedure well without immediate post procedural complication. IMPRESSION: Technically successful ultrasound guided core needle biopsy of right lobe hepatic mass. Ruthann Cancer, MD [facility] Electronically Signed   By: Ruthann Cancer MD   On: 04/23/2020 17:40   DG Chest Port 1 View  Result Date: 04/20/2020 CLINICAL DATA:  Shortness of breath.  Constipation.  Weakness. EXAM: PORTABLE CHEST 1 VIEW COMPARISON:  06/09/2018 FINDINGS: Altered mediastinal contour compared to the prior exam, I cannot exclude aortic aneurysm, mediastinal mass, or hematoma. CT of the chest (with contrast if feasible) is recommended for further workup. The lungs appear clear.  Thoracic spondylosis noted. IMPRESSION: 1. Abnormal mediastinal contour, substantially changed from 06/09/2018. I cannot exclude  aortic aneurysm, mediastinal mass, or hematoma. CT of the chest (with contrast if feasible) is recommended for further workup. 2. Thoracic spondylosis. Electronically Signed   By: Van Clines M.D.   On: 04/20/2020 18:48    Labs:  CBC: Recent Labs    04/21/20 0059 04/21/20 0059 04/22/20 0210 04/23/20 1801 04/23/20 2154 04/24/20 0329  WBC 4.3  --  4.0 6.5  --  5.0  HGB 10.9*   < > 10.5* 9.5* 8.2* 8.3*  HCT 31.6*   < > 31.8* 29.2* 24.7* 24.8*  PLT 126*  --  112* 134*  --  129*   < > = values in this interval not displayed.    COAGS: Recent Labs    04/22/20 0210  INR 1.3*    BMP: Recent Labs    04/21/20 0059 04/22/20 0210 04/23/20 0232 04/24/20 0329  NA 136 136 138 138  K 3.6 3.4* 3.9 3.8  CL 101 104 108 109  CO2 27 24 21* 21*  GLUCOSE 89 86 79 87  BUN 41* 41* 42* 47*  CALCIUM >15.0* 13.1* 12.1* 11.3*  CREATININE 2.61* 2.74* 2.75* 2.98*  GFRNONAA 25* 24* 24* 22*    LIVER FUNCTION TESTS: Recent Labs    04/21/20 0059 04/22/20 0210 04/23/20 0232 04/24/20 0329  BILITOT 1.5* 1.4* 1.4* 1.1  AST 54* 66* 72* 106*  ALT 30 43 51* 64*  ALKPHOS 77 95 127* 99  PROT 5.1* 4.5* 4.5* 4.2*  ALBUMIN 2.9* 2.6* 2.5* 2.4*    Assessment and Plan:  History of liver mass s/p liver mass biopsy in IR 61/60/7371; complicated by hypotension and decrease in hgb post-procedure. Patient hemodynamically stable. Does have minimal tenderness of RUQ liver biopsy site (as expected) however site unremarkable. Hypotension  resolved- most recent BP 132/59. Hgb grossly stable at 8.3 following transfusion (up from 8.2 post-procedure; 10.5 prior to procedure). Discussed case with Dr. Serafina Royals- recommend CT abdomen (without contrast) for further evaluation if patient clinically worsens. Further plans per TRH/oncology- appreciate and agree with management. Please call IR with questions/concerns.   Electronically Signed: Earley Abide, PA-C 04/24/2020, 9:31 AM   I spent a total of 25  Minutes at the the patient's bedside AND on the patient's hospital floor or unit, greater than 50% of which was counseling/coordinating care for liver biopsy follow-up.

## 2020-04-24 NOTE — Care Management Important Message (Signed)
Important Message  Patient Details  Name: John [name] MRN: [id number] Date of Birth: July 13, 1945   Medicare Important Message Given:  Yes     Naleah Kofoed P [place] 04/24/2020, 2:05 PM

## 2020-04-24 NOTE — Evaluation (Signed)
Physical Therapy Evaluation Patient Details Name: John [name] MRN: [id number] DOB: 1946-04-02 Today's Date: 04/24/2020   History of Present Illness  78y male c/o weakness, fatigue, and constipation; PCP found him to have critically high calcium levels and referred him to the ED. New mediastinal and liver masses found per CT. PMH HTN, R knee pain, foot surgery  Clinical Impression   Patient received in bed, very pleasant and cooperative with therapy. Able to mobilize on a min guard-MinA basis with RW, however today's session significantly limited by HR elevation to as much as 136 with gait to/from the door of his room; HR also showed pattern of sustaining between 125-130BPM just sitting at EOB, patient symptomatic. Orthostatic testing negative and BP maintained well during session. Left in bed in chair position with spouse present, bed alarm active and all needs otherwise met. Will benefit from skilled HHPT f/u once medically ready.     Follow Up Recommendations Home health PT;Supervision for mobility/OOB    Equipment Recommendations  Rolling walker with 5" wheels;Other (comment);Wheelchair (measurements PT);Wheelchair cushion (measurements PT) (shower bench)    Recommendations for Other Services       Precautions / Restrictions Precautions Precautions: Fall;Other (comment) Precaution Comments: watch HR Restrictions Weight Bearing Restrictions: No      Mobility  Bed Mobility Overal bed mobility: Needs Assistance Bed Mobility: Supine to Sit;Sit to Supine     Supine to sit: Min guard;HOB elevated Sit to supine: Min guard   General bed mobility comments: min guard with HOB elevated, increased time and effort but able to perform without physical assist    Transfers Overall transfer level: Needs assistance Equipment used: Rolling walker (2 wheeled) Transfers: Sit to/from Stand Sit to Stand: Min guard;Min assist         General transfer comment: initially MinA fading to  Min guard with repeated practice, needs ongoing cues for hand placement/sequencing  Ambulation/Gait Ambulation/Gait assistance: Min guard Gait Distance (Feet): 16 Feet Assistive device: Rolling walker (2 wheeled) Gait Pattern/deviations: Step-through pattern;Decreased step length - right;Decreased step length - left;Decreased stride length;Trunk flexed Gait velocity: decreased   General Gait Details: slow but steady with RW; gait distance limited by HR  Stairs            Wheelchair Mobility    Modified Rankin (Stroke Patients Only)       Balance Overall balance assessment: Mild deficits observed, not formally tested                                           Pertinent Vitals/Pain Pain Assessment: Faces Faces Pain Scale: Hurts a little bit Pain Location: generalized discomfort Pain Descriptors / Indicators: Discomfort Pain Intervention(s): Limited activity within patient's tolerance;Monitored during session    Home Living Family/patient expects to be discharged to:: Private residence Living Arrangements: Spouse/significant other Available Help at Discharge: Family;Available 24 hours/day Type of Home: House Home Access: Stairs to enter Entrance Stairs-Rails: Can reach both Entrance Stairs-Number of Steps: 4-5 with B rails Home Layout: One level Home Equipment: None      Prior Function Level of Independence: Independent         Comments: no falls or close calls     Hand Dominance        Extremity/Trunk Assessment   Upper Extremity Assessment Upper Extremity Assessment: Defer to OT evaluation    Lower Extremity Assessment Lower Extremity Assessment:  Generalized weakness    Cervical / Trunk Assessment Cervical / Trunk Assessment: Kyphotic  Communication   Communication: No difficulties  Cognition Arousal/Alertness: Awake/alert Behavior During Therapy: WFL for tasks assessed/performed Overall Cognitive Status: Within Functional  Limits for tasks assessed                                        General Comments      Exercises     Assessment/Plan    PT Assessment Patient needs continued PT services  PT Problem List Decreased strength;Decreased knowledge of use of DME;Decreased activity tolerance;Decreased safety awareness;Decreased balance;Decreased mobility;Cardiopulmonary status limiting activity       PT Treatment Interventions DME instruction;Balance training;Gait training;Stair training;Functional mobility training;Patient/family education;Therapeutic activities;Therapeutic exercise    PT Goals (Current goals can be found in the Care Plan section)  Acute Rehab PT Goals Patient Stated Goal: go home today PT Goal Formulation: With patient/family Time For Goal Achievement: 05/08/20 Potential to Achieve Goals: Fair    Frequency Min 3X/week   Barriers to discharge        Co-evaluation               AM-PAC PT "6 Clicks" Mobility  Outcome Measure Help needed turning from your back to your side while in a flat bed without using bedrails?: A Little Help needed moving from lying on your back to sitting on the side of a flat bed without using bedrails?: A Little Help needed moving to and from a bed to a chair (including a wheelchair)?: A Little Help needed standing up from a chair using your arms (e.g., wheelchair or bedside chair)?: A Little Help needed to walk in hospital room?: A Little Help needed climbing 3-5 steps with a railing? : A Lot 6 Click Score: 17    End of Session Equipment Utilized During Treatment: Gait belt Activity Tolerance: Treatment limited secondary to medical complications (Comment) (HR elevation with EOB/OOB activities) Patient left: in bed;with call bell/phone within reach;with bed alarm set;with family/visitor present (bed in chair position) Nurse Communication: Mobility status;Other (comment) (HR elevation during session) PT Visit Diagnosis:  Unsteadiness on feet (R26.81);Muscle weakness (generalized) (M62.81)    Time: 8264-1583 PT Time Calculation (min) (ACUTE ONLY): 25 min   Charges:   PT Evaluation $PT Eval Moderate Complexity: 1 Mod PT Treatments $Gait Training: 8-22 mins        Windell Norfolk, DPT, PN1   Supplemental Physical Therapist [place]    Pager [phone] Acute Rehab Office [phone]

## 2020-04-24 NOTE — Progress Notes (Signed)
PROGRESS NOTE    John [name]  WJX:[id number] DOB: September 19, 1945 DOA: 04/20/2020 PCP: Christain Sacramento, MD    Brief Narrative:  John [name] is a 74 year old male with past medical history notable for essential hypertension, OSA on CPAP who presented to the ED with complaints of weakness and constipation over the past 2-3 weeks.  Was seen by his PCP and given laxatives; which assisted in resolution of his constipation.  Given persistent weakness, he proceeded back to his PCP with lab testing performed that showed critically high calcium levels and was then instructed to come to the emergency department.  Patient did not take any calcium supplementation at home.  His wife also reports that he is been slightly confused over this timeframe as well.  Denies fever/chills/night sweats, no nausea vomiting, shortness of breath or chest pain.  In the ED, creatinine 2.6 which is elevated from January 2020.  Calcium greater than 15, hemoglobin 12.6, platelets 147.  CT chest/abdomen/pelvis without contrast notable for anterior mediastinal mass and right lobe hypodensity.  Patient was started on 2 L NS bolus.  Hospitalist service consulted for admission for further evaluation and treatment.   Assessment & Plan:   Principal Problem:   Hypercalcemia Active Problems:   Essential hypertension   ARF (acute renal failure) (HCC)   Mediastinal mass   Liver mass   Anterior mediastinal mass Right hepatic hypodensity Patient presenting with progressive weakness and constipation over the past 2-3 weeks.  Reports 10 pound weight loss over the past month.  Was noted to have severely elevated calcium level at PCP visit.  CT chest/abdomen notable for large anterior mediastinal mass with right liver lobe hypodensity and extensive mesenteric and retroperitoneal adenopathy.  LDH elevated 416. AFP 2.0 (normal), CEA 1.9 (normal), CA 19-9 <2. Concern for lymphoma versus thymoma versus germinal cell tumor.  --Medical  oncology, Dr. Burr Medico following, appreciate assistance --s/p ultrasound-guided liver biopsy 04/23/2020 by IR --Pathology pending  Tumor lysis syndrome Uric acid elevated at 11.9. --Oncology ordering rasburicase --Continue IV fluid hydration with NS --Continue close monitoring of renal function and uric acid level daily  Post procedure hypotension and acute blood loss anemia Following ultrasound-guided liver biopsy on 04/23/2020, on return to room, patient was hypotensive with BP 84/38.  Patient was asymptomatic besides some localized discomfort surrounding the biopsy site.  Patient was given fentanyl and Versed during procedure.  Discussed with interventional radiology, Dr. Serafina Royals, and there was some bleeding during the procedure as a liver mass was very vascular, in which he utilized Gelfoam slurry needle track embolization. --Hgb 10.5>9.5>8.2>8.3>8.3 --Blood pressure now improved --Continue to hold home antihypertensives, metoprolol, Cardura, benazepril --If hemoglobin continues to trend down, consider CT abdomen without contrast for further evaluation per IR --Repeat CBC in the a.m.  Severe hypercalcemia Suspect hypercalcemia of malignancy given mediastinal/hepatic mass.  Not on calcium or vitamin D supplements.  Vitamin D 25 hydroxy 21.57 (low). Received 2L NS bolus followed by pamidronate 90 mg IV on 04/21/2020. --Ca >15, 13.1>12.1>11.3 --continue NS at 100 mL/hr --Continue close monitoring of calcium level  Acute renal failure Creatinine 2.67 on admission, baseline 0.78 06/2018.  Ultrasound with 2.4 cm benign-appearing left renal cyst, otherwise normal appearance; no mass or hydronephrosis. FeNa 3.0%, consistent with intrinsic etiology; likely from underlying tumor lysis syndrome --Cr 2.67>2.61>2.74>2.75>2.98 --Holding home benazepril --Continue IV fluid hydration, treatment for tumor lysis syndrome as above --Avoid nephrotoxins, renally dose all medications --BMP  daily  Anemia Thrombocytopenia Hemoglobin 10.9, platelets 126 on admission.  Suspect etiology from malignancy as above.  Iron 59, TIBC 286, ferritin 1266, folate 27.9, B12 497.  Peripheral smear review by pathology with normocytic anemia with thrombocytopenia. --SPEP --continue to monitor CBC daily  Essential hypertension --Holding benazepril as above for acute renal failure --Holding metoprolol tartrate 12.5 mg p.o. twice daily for borderline hypotension  BPH: holding doxazosin 4 mg p.o. daily  OSA --Continue nocturnal CPAP; has been refusing inpatient  Weakness/debility, deconditioning: --PT/OT following.  DVT prophylaxis: SCDs Code Status: Full code Family Communication: Discussed with spouse present at bedside  Disposition Plan:  Status is: Inpatient  Remains inpatient appropriate because:Persistent severe electrolyte disturbances, Ongoing diagnostic testing needed not appropriate for outpatient work up, Unsafe d/c plan, IV treatments appropriate due to intensity of illness or inability to take PO and Inpatient level of care appropriate due to severity of illness   Dispo:  Patient From: Home  Planned Disposition: Home  Expected discharge date: 04/26/20  Medically stable for discharge: No   Consultants:   Medical oncology, Dr. Burr Medico  Interventional radiology  Procedures:   Ultrasound-guided liver biopsy, IR 04/23/2020  Antimicrobials:   None   Subjective: Patient seen and examined at bedside, resting comfortably lying in bed.  Spouse present at bedside.  Continues with mild pain to biopsy site.  Patient desires discharge home, although uric acid level elevated and will be receiving rasburicase and continued IV fluids per oncology today.  No other complaints or concerns at this time. Denies headache, no visual changes, no chest pain, no palpitations, no shortness of breath, no abdominal pain, no paresthesias.  No acute events overnight per nursing  staff.  Objective: Vitals:   04/23/20 2348 04/24/20 0400 04/24/20 0853 04/24/20 0949  BP: (!) 117/47 (!) 108/47 (!) 132/59 (!) 103/46  Pulse: 84 82 (!) 113 99  Resp:  18 18 (!) 24  Temp:  97.8 F (36.6 C)  99.2 F (37.3 C)  TempSrc:  Oral  Oral  SpO2:   96% 94%  Weight:      Height:        Intake/Output Summary (Last 24 hours) at 04/24/2020 1426 Last data filed at 04/24/2020 1103 Gross per 24 hour  Intake 320 ml  Output 551 ml  Net -231 ml   Filed Weights   04/21/20 0316 04/21/20 2142 04/22/20 0500  Weight: 94.8 kg 100.9 kg 100.9 kg    Examination:  General exam: Appears calm and comfortable  Respiratory system: Clear to auscultation. Respiratory effort normal.  Oxygenating well on room air Cardiovascular system: S1 & S2 heard, RRR. No JVD, murmurs, rubs, gallops or clicks.  Trace lower extremity edema bilaterally to mid shin Gastrointestinal system: Abdomen is nondistended, soft and nontender. No organomegaly or masses felt. Normal bowel sounds heard.  Biopsy site noted, no surrounding erythema, no fluctuance, no ecchymosis Central nervous system: Alert and oriented. No focal neurological deficits. Extremities: Symmetric 5 x 5 power. Skin: No rashes, lesions or ulcers Psychiatry: Judgement and insight appear normal. Mood & affect appropriate.     Data Reviewed: I have personally reviewed following labs and imaging studies  CBC: Recent Labs  Lab 04/20/20 1818 04/20/20 1818 04/21/20 0059 04/21/20 0059 04/22/20 0210 04/23/20 1801 04/23/20 2154 04/24/20 0329 04/24/20 1209  WBC 5.8  --  4.3  --  4.0 6.5  --  5.0  --   NEUTROABS 4.4  --  3.2  --   --   --   --   --   --   HGB  12.2*   < > 10.9*   < > 10.5* 9.5* 8.2* 8.3* 8.3*  HCT 36.3*   < > 31.6*   < > 31.8* 29.2* 24.7* 24.8* 24.2*  MCV 87.3  --  86.6  --  86.4 89.6  --  86.7  --   PLT 150  --  126*  --  112* 134*  --  129*  --    < > = values in this interval not displayed.   Basic Metabolic Panel: Recent  Labs  Lab 04/20/20 1818 04/21/20 0059 04/22/20 0210 04/23/20 0232 04/24/20 0329  NA 130* 136 136 138 138  K 4.1 3.6 3.4* 3.9 3.8  CL 91* 101 104 108 109  CO2 28 27 24  21* 21*  GLUCOSE 91 89 86 79 87  BUN 43* 41* 41* 42* 47*  CREATININE 2.67* 2.61* 2.74* 2.75* 2.98*  CALCIUM >15.0* >15.0* 13.1* 12.1* 11.3*  MG 2.0  --   --  1.6* 1.9  PHOS  --  4.0  --   --   --    GFR: Estimated Creatinine Clearance: 27.1 mL/min (A) (by C-G formula based on SCr of 2.98 mg/dL (H)). Liver Function Tests: Recent Labs  Lab 04/20/20 1818 04/21/20 0059 04/22/20 0210 04/23/20 0232 04/24/20 0329  AST 71* 54* 66* 72* 106*  ALT 32 30 43 51* 64*  ALKPHOS 93 77 95 127* 99  BILITOT 1.6* 1.5* 1.4* 1.4* 1.1  PROT 5.7* 5.1* 4.5* 4.5* 4.2*  ALBUMIN 3.4* 2.9* 2.6* 2.5* 2.4*   No results for input(s): LIPASE, AMYLASE in the last 168 hours. No results for input(s): AMMONIA in the last 168 hours. Coagulation Profile: Recent Labs  Lab 04/22/20 0210  INR 1.3*   Cardiac Enzymes: No results for input(s): CKTOTAL, CKMB, CKMBINDEX, TROPONINI in the last 168 hours. BNP (last 3 results) No results for input(s): PROBNP in the last 8760 hours. HbA1C: No results for input(s): HGBA1C in the last 72 hours. CBG: Recent Labs  Lab 04/20/20 1857  GLUCAP 92   Lipid Profile: No results for input(s): CHOL, HDL, LDLCALC, TRIG, CHOLHDL, LDLDIRECT in the last 72 hours. Thyroid Function Tests: No results for input(s): TSH, T4TOTAL, FREET4, T3FREE, THYROIDAB in the last 72 hours. Anemia Panel: No results for input(s): VITAMINB12, FOLATE, FERRITIN, TIBC, IRON, RETICCTPCT in the last 72 hours. Sepsis Labs: No results for input(s): PROCALCITON, LATICACIDVEN in the last 168 hours.  Recent Results (from the past 240 hour(s))  Respiratory Panel by RT PCR (Flu A&B, Covid) - Nasopharyngeal Swab     Status: None   Collection Time: 04/20/20  6:25 PM   Specimen: Nasopharyngeal Swab  Result Value Ref Range Status   SARS  Coronavirus 2 by RT PCR NEGATIVE NEGATIVE Final    Comment: (NOTE) SARS-CoV-2 target nucleic acids are NOT DETECTED.  The SARS-CoV-2 RNA is generally detectable in upper respiratoy specimens during the acute phase of infection. The lowest concentration of SARS-CoV-2 viral copies this assay can detect is 131 copies/mL. A negative result does not preclude SARS-Cov-2 infection and should not be used as the sole basis for treatment or other patient management decisions. A negative result may occur with  improper specimen collection/handling, submission of specimen other than nasopharyngeal swab, presence of viral mutation(s) within the areas targeted by this assay, and inadequate number of viral copies (<131 copies/mL). A negative result must be combined with clinical observations, patient history, and epidemiological information. The expected result is Negative.  Fact Sheet for Patients:  PinkCheek.be  Fact Sheet for Healthcare Providers:  GravelBags.it  This test is no t yet approved or cleared by the [place] FDA and  has been authorized for detection and/or diagnosis of SARS-CoV-2 by FDA under an Emergency Use Authorization (EUA). This EUA will remain  in effect (meaning this test can be used) for the duration of the COVID-19 declaration under Section 564(b)(1) of the Act, 21 U.S.C. section 360bbb-3(b)(1), unless the authorization is terminated or revoked sooner.     Influenza A by PCR NEGATIVE NEGATIVE Final   Influenza B by PCR NEGATIVE NEGATIVE Final    Comment: (NOTE) The Xpert Xpress SARS-CoV-2/FLU/RSV assay is intended as an aid in  the diagnosis of influenza from Nasopharyngeal swab specimens and  should not be used as a sole basis for treatment. Nasal washings and  aspirates are unacceptable for Xpert Xpress SARS-CoV-2/FLU/RSV  testing.  Fact Sheet for  Patients: PinkCheek.be  Fact Sheet for Healthcare Providers: GravelBags.it  This test is not yet approved or cleared by the [place] FDA and  has been authorized for detection and/or diagnosis of SARS-CoV-2 by  FDA under an Emergency Use Authorization (EUA). This EUA will remain  in effect (meaning this test can be used) for the duration of the  Covid-19 declaration under Section 564(b)(1) of the Act, 21  U.S.C. section 360bbb-3(b)(1), unless the authorization is  terminated or revoked. Performed at [facility], [place] [address], [place], [place] [address]          Radiology Studies: US BIOPSY (LIVER)  Result Date: 04/23/2020 INDICATION: 74 year old male with indeterminate right lobe up attic mass. EXAM: ULTRASOUND GUIDED LIVER LESION BIOPSY COMPARISON:  None. MEDICATIONS: None ANESTHESIA/SEDATION: Fentanyl 75 mcg IV; Versed 1.5 mg IV Total Moderate Sedation time:  13 minutes. The patient's level of consciousness and vital signs were monitored continuously by radiology nursing throughout the procedure under my direct supervision. COMPLICATIONS: None immediate. PROCEDURE: Informed written consent was obtained from the patient after a discussion of the risks, benefits and alternatives to treatment. The patient understands and consents the procedure. A timeout was performed prior to the initiation of the procedure. Ultrasound scanning was performed of the right upper abdominal quadrant demonstrates heterogeneously hypoechoic well-defined mass in the right lobe measuring up to approximately 9 cm. The right lobe mass was selected for biopsy and the procedure was planned. The right upper abdominal quadrant was prepped and draped in the usual sterile fashion. The overlying soft tissues were anesthetized with 1% lidocaine with epinephrine. A 17 gauge, 6.8 cm co-axial needle was advanced into a peripheral aspect of the lesion. This  was followed by 3 core biopsies with an 18 gauge core device under direct ultrasound guidance. The coaxial needle tract was embolized with a small amount of Gel-Foam slurry and superficial hemostasis was obtained with manual compression. Post procedural scanning was negative for definitive area of hemorrhage or additional complication. A dressing was placed. The patient tolerated the procedure well without immediate post procedural complication. IMPRESSION: Technically successful ultrasound guided core needle biopsy of right lobe hepatic mass. Ruthann Cancer, MD [facility] Radiology Electronically Signed   By: Ruthann Cancer MD   On: 04/23/2020 17:40        Scheduled Meds: . fluticasone  2 spray Each Nare Daily   Continuous Infusions: . sodium chloride    . rasburicase (ELITEK) IV infusion       LOS: 3 days    Time spent: 34 minutes spent on  chart review, discussion with nursing staff, consultants, updating family and interview/physical exam; more than 50% of that time was spent in counseling and/or coordination of care.    Sophiya Morello J [place] DO [facility] Available via Epic secure chat 7am-7pm After these hours, please refer to coverage provider listed on [email or web address] 04/24/2020, 2:26 PM

## 2020-04-25 ENCOUNTER — Inpatient Hospital Stay (HOSPITAL_COMMUNITY): Payer: Medicare HMO

## 2020-04-25 LAB — COMPREHENSIVE METABOLIC PANEL
ALT: 51 U/L — ABNORMAL HIGH (ref 0–44)
AST: 82 U/L — ABNORMAL HIGH (ref 15–41)
Albumin: 2.4 g/dL — ABNORMAL LOW (ref 3.5–5.0)
Alkaline Phosphatase: 88 U/L (ref 38–126)
Anion gap: 8 (ref 5–15)
BUN: 46 mg/dL — ABNORMAL HIGH (ref 8–23)
CO2: 20 mmol/L — ABNORMAL LOW (ref 22–32)
Calcium: 10.3 mg/dL (ref 8.9–10.3)
Chloride: 104 mmol/L (ref 98–111)
Creatinine, Ser: 2.76 mg/dL — ABNORMAL HIGH (ref 0.61–1.24)
GFR, Estimated: 24 mL/min — ABNORMAL LOW (ref >60)
Glucose, Bld: 101 mg/dL — ABNORMAL HIGH (ref 70–99)
Potassium: 3.7 mmol/L (ref 3.5–5.1)
Sodium: 132 mmol/L — ABNORMAL LOW (ref 135–145)
Total Bilirubin: 1.1 mg/dL (ref 0.3–1.2)
Total Protein: 4.3 g/dL — ABNORMAL LOW (ref 6.5–8.1)

## 2020-04-25 LAB — HEMOGLOBIN AND HEMATOCRIT, BLOOD
HCT: 24.7 % — ABNORMAL LOW (ref 39.0–52.0)
HCT: 24.1 % — ABNORMAL LOW (ref 39.0–52.0)
Hemoglobin: 8.3 g/dL — ABNORMAL LOW (ref 13.0–17.0)
Hemoglobin: 8.1 g/dL — ABNORMAL LOW (ref 13.0–17.0)

## 2020-04-25 LAB — CBC
HCT: 21.3 % — ABNORMAL LOW (ref 39.0–52.0)
Hemoglobin: 7.2 g/dL — ABNORMAL LOW (ref 13.0–17.0)
MCH: 28.8 pg (ref 26.0–34.0)
MCHC: 33.8 g/dL (ref 30.0–36.0)
MCV: 85.2 fL (ref 80.0–100.0)
Platelets: 116 10*3/uL — ABNORMAL LOW (ref 150–400)
RBC: 2.5 MIL/uL — ABNORMAL LOW (ref 4.22–5.81)
RDW: 13.7 % (ref 11.5–15.5)
WBC: 4 10*3/uL (ref 4.0–10.5)
nRBC: 0 % (ref 0.0–0.2)

## 2020-04-25 LAB — PREPARE RBC (CROSSMATCH)

## 2020-04-25 LAB — URIC ACID: Uric Acid, Serum: 10.5 mg/dL — ABNORMAL HIGH (ref 3.7–8.6)

## 2020-04-25 MED ORDER — SODIUM CHLORIDE 0.9 % IV SOLN: INTRAVENOUS | Status: DC

## 2020-04-25 MED ORDER — SODIUM CHLORIDE 0.9% IV SOLUTION: Freq: Once | INTRAVENOUS | Status: AC

## 2020-04-25 NOTE — Progress Notes (Addendum)
PROGRESS NOTE    John [name]  CVE:[id number] DOB: 06-18-1945 DOA: 04/20/2020 PCP: Christain Sacramento, MD    Brief Narrative:  LANTZ HERMANN is a 74 year old male with past medical history notable for essential hypertension, OSA on CPAP who presented to the ED with complaints of weakness and constipation over the past 2-3 weeks.  Was seen by his PCP and given laxatives; which assisted in resolution of his constipation.  Given persistent weakness, he proceeded back to his PCP with lab testing performed that showed critically high calcium levels and was then instructed to come to the emergency department.  Patient did not take any calcium supplementation at home.  His wife also reports that he is been slightly confused over this timeframe as well.  Denies fever/chills/night sweats, no nausea vomiting, shortness of breath or chest pain.  In the ED, creatinine 2.6 which is elevated from January 2020.  Calcium greater than 15, hemoglobin 12.6, platelets 147.  CT chest/abdomen/pelvis without contrast notable for anterior mediastinal mass and right lobe hypodensity.  Patient was started on 2 L NS bolus.  Hospitalist service consulted for admission for further evaluation and treatment.   Assessment & Plan:   Principal Problem:   Hypercalcemia Active Problems:   Essential hypertension   ARF (acute renal failure) (HCC)   Mediastinal mass   Liver mass   Tumor lysis syndrome  Addendum 1400:  Received call from radiology regarding CT abdomen/pelvis without contrast.  Discussed with radiology, Dr. Anselm Pancoast. Findings consistent with small-moderate hemoperitoneum related to recent liver biopsy with extensive lymphadenopathy throughout abdomen/pelvis with large mass or nodal mass near pancreatic head.  Also additional lesions involving liver and spleen suggestive of metastatic disease.  Patient has been transfused with 1 unit PRBC with repeat hemoglobin 8.3; up from 7.2 this morning.  Updated patient and  spouse at bedside this afternoon. --Hemodynamically stable, last BP 143/67 --Repeat hemoglobin at 2100  Anterior mediastinal mass Right hepatic hypodensity Patient presenting with progressive weakness and constipation over the past 2-3 weeks.  Reports 10 pound weight loss over the past month.  Was noted to have severely elevated calcium level at PCP visit.  CT chest/abdomen notable for large anterior mediastinal mass with right liver lobe hypodensity and extensive mesenteric and retroperitoneal adenopathy.  LDH elevated 416. AFP 2.0 (normal), CEA 1.9 (normal), CA 19-9 <2. Concern for lymphoma versus thymoma versus germinal cell tumor.  --Medical oncology, Dr. Burr Medico following, appreciate assistance --s/p ultrasound-guided liver biopsy 04/23/2020 by IR --Pathology pending  Tumor lysis syndrome Uric acid elevated at 11.9. --s/p rasburicase 11/16 --Uric acid 11.9>10.5 --Continue IV fluid hydration with NS at 127mL/hr --Continue close monitoring of renal function and uric acid level daily  Post procedure hypotension and acute blood loss anemia Following ultrasound-guided liver biopsy on 04/23/2020, on return to room, patient was hypotensive with BP 84/38.  Patient was asymptomatic besides some localized discomfort surrounding the biopsy site.  Patient was given fentanyl and Versed during procedure.  Discussed with interventional radiology, Dr. Serafina Royals, and there was some bleeding during the procedure as a liver mass was very vascular, in which he utilized Gelfoam slurry needle track embolization. --Hgb 10.5>9.5>8.2>8.3>8.3>7.2 --Blood pressure now stable; 139/59 this am --Continue to hold home antihypertensives, metoprolol, Cardura, benazepril --Check CT Abd/pelvis w/o contrast to eval for hematoma/bleeding following IR Bx Liver --Transfuse 1u pRBC today, H/H 2 hours following transfusion --Follow CBC daily  Severe hypercalcemia: resolved Suspect hypercalcemia of malignancy given  mediastinal/hepatic mass.  Not on calcium or vitamin  D supplements.  Vitamin D 25 hydroxy 21.57 (low). Received 2L NS bolus followed by pamidronate 90 mg IV on 04/21/2020. --Ca >15, 13.1>12.1>11.3>10.3 --continue NS at 100 mL/hr --Continue close monitoring of calcium level  Acute renal failure Creatinine 2.67 on admission, baseline 0.78 06/2018.  Ultrasound with 2.4 cm benign-appearing left renal cyst, otherwise normal appearance; no mass or hydronephrosis. FeNa 3.0%, consistent with intrinsic etiology; likely from underlying tumor lysis syndrome --Cr 2.67>2.61>2.74>2.75>2.98>2.76 --Holding home benazepril --Continue IV fluid hydration, treatment for tumor lysis syndrome as above --Avoid nephrotoxins, renally dose all medications --BMP daily  Anemia Thrombocytopenia Hemoglobin 10.9, platelets 126 on admission.  Suspect etiology from malignancy as above.  Iron 59, TIBC 286, ferritin 1266, folate 27.9, B12 497.  Peripheral smear review by pathology with normocytic anemia with thrombocytopenia. --SPEP --continue to monitor CBC daily  Essential hypertension --Holding benazepril as above for acute renal failure --Holding metoprolol tartrate 12.5 mg p.o. twice daily for borderline hypotension  BPH: holding doxazosin 4 mg p.o. daily  OSA --Continue nocturnal CPAP; has been refusing inpatient  Weakness/debility, deconditioning: --PT/OT following.  DVT prophylaxis: SCDs Code Status: Full code Family Communication: Discussed with spouse present at bedside  Disposition Plan:  Status is: Inpatient  Remains inpatient appropriate because:Persistent severe electrolyte disturbances, Ongoing diagnostic testing needed not appropriate for outpatient work up, Unsafe d/c plan, IV treatments appropriate due to intensity of illness or inability to take PO and Inpatient level of care appropriate due to severity of illness   Dispo:  Patient From: Home  Planned Disposition: Home with Health Care  Svc  Expected discharge date: 04/27/20  Medically stable for discharge: No   Consultants:   Medical oncology, Dr. Burr Medico  Interventional radiology  Procedures:   Ultrasound-guided liver biopsy, IR 04/23/2020  Antimicrobials:   None   Subjective: Patient seen and examined at bedside, resting comfortably lying in bed.  Spouse present at bedside.  Patient spouse concerned about his decreased level of activity and unable to ambulate per nursing staff.  Spouse would like him to ambulate more during the day so he does not become weaker.  Discussed with patient and spouse hemoglobin dropped and will order CT abdomen/pelvis and transfuse 1 unit PRBC today.  Creatinine slightly improved today, calcium now normalized, uric acid level improving.  No other complaints or concerns at this time. Denies headache, no visual changes, no chest pain, no palpitations, no shortness of breath, no fever/chills/night sweats, no nausea/vomiting/diarrhea, no paresthesias.  No acute events overnight per nursing staff.  Objective: Vitals:   04/25/20 0859 04/25/20 0900 04/25/20 0956 04/25/20 1030  BP: 135/63 130/63 130/63 (!) 146/62  Pulse: 99 93 93 (!) 103  Resp: 20 20 20 20   Temp: 98.9 F (37.2 C) 99.3 F (37.4 C) 99.3 F (37.4 C) 99.5 F (37.5 C)  TempSrc: Oral Oral Oral Oral  SpO2: 95%  97% 97%  Weight:      Height:        Intake/Output Summary (Last 24 hours) at 04/25/2020 1037 Last data filed at 04/25/2020 0900 Gross per 24 hour  Intake 1539.51 ml  Output 950 ml  Net 589.51 ml   Filed Weights   04/21/20 0316 04/21/20 2142 04/22/20 0500  Weight: 94.8 kg 100.9 kg 100.9 kg    Examination:  General exam: Appears calm and comfortable  Respiratory system: Clear to auscultation. Respiratory effort normal.  Oxygenating well on room air Cardiovascular system: S1 & S2 heard, RRR. No JVD, murmurs, rubs, gallops or clicks.  Trace lower  extremity edema bilaterally to mid shin Gastrointestinal  system: Abdomen is nondistended, soft and nontender. No organomegaly or masses felt. Normal bowel sounds heard.  Biopsy site noted, no surrounding erythema, no fluctuance, no ecchymosis Central nervous system: Alert and oriented. No focal neurological deficits. Extremities: Symmetric 5 x 5 power. Skin: No rashes, lesions or ulcers Psychiatry: Judgement and insight appear normal. Mood & affect appropriate.     Data Reviewed: I have personally reviewed following labs and imaging studies  CBC: Recent Labs  Lab 04/20/20 1818 04/20/20 1818 04/21/20 0059 04/21/20 0059 04/22/20 0210 04/22/20 0210 04/23/20 1801 04/23/20 2154 04/24/20 0329 04/24/20 1209 04/25/20 0027  WBC 5.8   < > 4.3  --  4.0  --  6.5  --  5.0  --  4.0  NEUTROABS 4.4  --  3.2  --   --   --   --   --   --   --   --   HGB 12.2*   < > 10.9*   < > 10.5*   < > 9.5* 8.2* 8.3* 8.3* 7.2*  HCT 36.3*   < > 31.6*   < > 31.8*   < > 29.2* 24.7* 24.8* 24.2* 21.3*  MCV 87.3   < > 86.6  --  86.4  --  89.6  --  86.7  --  85.2  PLT 150   < > 126*  --  112*  --  134*  --  129*  --  116*   < > = values in this interval not displayed.   Basic Metabolic Panel: Recent Labs  Lab 04/20/20 1818 04/20/20 1818 04/21/20 0059 04/22/20 0210 04/23/20 0232 04/24/20 0329 04/25/20 0027  NA 130*   < > 136 136 138 138 132*  K 4.1   < > 3.6 3.4* 3.9 3.8 3.7  CL 91*   < > 101 104 108 109 104  CO2 28   < > 27 24 21* 21* 20*  GLUCOSE 91   < > 89 86 79 87 101*  BUN 43*   < > 41* 41* 42* 47* 46*  CREATININE 2.67*   < > 2.61* 2.74* 2.75* 2.98* 2.76*  CALCIUM >15.0*   < > >15.0* 13.1* 12.1* 11.3* 10.3  MG 2.0  --   --   --  1.6* 1.9  --   PHOS  --   --  4.0  --   --   --   --    < > = values in this interval not displayed.   GFR: Estimated Creatinine Clearance: 29.3 mL/min (A) (by C-G formula based on SCr of 2.76 mg/dL (H)). Liver Function Tests: Recent Labs  Lab 04/21/20 0059 04/22/20 0210 04/23/20 0232 04/24/20 0329 04/25/20 0027  AST  54* 66* 72* 106* 82*  ALT 30 43 51* 64* 51*  ALKPHOS 77 95 127* 99 88  BILITOT 1.5* 1.4* 1.4* 1.1 1.1  PROT 5.1* 4.5* 4.5* 4.2* 4.3*  ALBUMIN 2.9* 2.6* 2.5* 2.4* 2.4*   No results for input(s): LIPASE, AMYLASE in the last 168 hours. No results for input(s): AMMONIA in the last 168 hours. Coagulation Profile: Recent Labs  Lab 04/22/20 0210  INR 1.3*   Cardiac Enzymes: No results for input(s): CKTOTAL, CKMB, CKMBINDEX, TROPONINI in the last 168 hours. BNP (last 3 results) No results for input(s): PROBNP in the last 8760 hours. HbA1C: No results for input(s): HGBA1C in the last 72 hours. CBG: Recent Labs  Lab 04/20/20 1857  GLUCAP  92   Lipid Profile: No results for input(s): CHOL, HDL, LDLCALC, TRIG, CHOLHDL, LDLDIRECT in the last 72 hours. Thyroid Function Tests: No results for input(s): TSH, T4TOTAL, FREET4, T3FREE, THYROIDAB in the last 72 hours. Anemia Panel: No results for input(s): VITAMINB12, FOLATE, FERRITIN, TIBC, IRON, RETICCTPCT in the last 72 hours. Sepsis Labs: No results for input(s): PROCALCITON, LATICACIDVEN in the last 168 hours.  Recent Results (from the past 240 hour(s))  Respiratory Panel by RT PCR (Flu A&B, Covid) - Nasopharyngeal Swab     Status: None   Collection Time: 04/20/20  6:25 PM   Specimen: Nasopharyngeal Swab  Result Value Ref Range Status   SARS Coronavirus 2 by RT PCR NEGATIVE NEGATIVE Final    Comment: (NOTE) SARS-CoV-2 target nucleic acids are NOT DETECTED.  The SARS-CoV-2 RNA is generally detectable in upper respiratoy specimens during the acute phase of infection. The lowest concentration of SARS-CoV-2 viral copies this assay can detect is 131 copies/mL. A negative result does not preclude SARS-Cov-2 infection and should not be used as the sole basis for treatment or other patient management decisions. A negative result may occur with  improper specimen collection/handling, submission of specimen other than nasopharyngeal swab,  presence of viral mutation(s) within the areas targeted by this assay, and inadequate number of viral copies (<131 copies/mL). A negative result must be combined with clinical observations, patient history, and epidemiological information. The expected result is Negative.  Fact Sheet for Patients:  PinkCheek.be  Fact Sheet for Healthcare Providers:  GravelBags.it  This test is no t yet approved or cleared by the [place] FDA and  has been authorized for detection and/or diagnosis of SARS-CoV-2 by FDA under an Emergency Use Authorization (EUA). This EUA will remain  in effect (meaning this test can be used) for the duration of the COVID-19 declaration under Section 564(b)(1) of the Act, 21 U.S.C. section 360bbb-3(b)(1), unless the authorization is terminated or revoked sooner.     Influenza A by PCR NEGATIVE NEGATIVE Final   Influenza B by PCR NEGATIVE NEGATIVE Final    Comment: (NOTE) The Xpert Xpress SARS-CoV-2/FLU/RSV assay is intended as an aid in  the diagnosis of influenza from Nasopharyngeal swab specimens and  should not be used as a sole basis for treatment. Nasal washings and  aspirates are unacceptable for Xpert Xpress SARS-CoV-2/FLU/RSV  testing.  Fact Sheet for Patients: PinkCheek.be  Fact Sheet for Healthcare Providers: GravelBags.it  This test is not yet approved or cleared by the [place] FDA and  has been authorized for detection and/or diagnosis of SARS-CoV-2 by  FDA under an Emergency Use Authorization (EUA). This EUA will remain  in effect (meaning this test can be used) for the duration of the  Covid-19 declaration under Section 564(b)(1) of the Act, 21  U.S.C. section 360bbb-3(b)(1), unless the authorization is  terminated or revoked. Performed at [facility], [place] [address] [place], [place] [address]           Radiology Studies: US BIOPSY (LIVER)  Result Date: 04/23/2020 INDICATION: 74 year old male with indeterminate right lobe up attic mass. EXAM: ULTRASOUND GUIDED LIVER LESION BIOPSY COMPARISON:  None. MEDICATIONS: None ANESTHESIA/SEDATION: Fentanyl 75 mcg IV; Versed 1.5 mg IV Total Moderate Sedation time:  13 minutes. The patient's level of consciousness and vital signs were monitored continuously by radiology nursing throughout the procedure under my direct supervision. COMPLICATIONS: None immediate. PROCEDURE: Informed written consent was obtained from the patient after a discussion of the risks, benefits and  alternatives to treatment. The patient understands and consents the procedure. A timeout was performed prior to the initiation of the procedure. Ultrasound scanning was performed of the right upper abdominal quadrant demonstrates heterogeneously hypoechoic well-defined mass in the right lobe measuring up to approximately 9 cm. The right lobe mass was selected for biopsy and the procedure was planned. The right upper abdominal quadrant was prepped and draped in the usual sterile fashion. The overlying soft tissues were anesthetized with 1% lidocaine with epinephrine. A 17 gauge, 6.8 cm co-axial needle was advanced into a peripheral aspect of the lesion. This was followed by 3 core biopsies with an 18 gauge core device under direct ultrasound guidance. The coaxial needle tract was embolized with a small amount of Gel-Foam slurry and superficial hemostasis was obtained with manual compression. Post procedural scanning was negative for definitive area of hemorrhage or additional complication. A dressing was placed. The patient tolerated the procedure well without immediate post procedural complication. IMPRESSION: Technically successful ultrasound guided core needle biopsy of right lobe hepatic mass. Ruthann Cancer, MD [facility]  Electronically Signed   By: Ruthann Cancer MD   On: 04/23/2020 17:40        Scheduled Meds: . sodium chloride   Intravenous Once  . fluticasone  2 spray Each Nare Daily   Continuous Infusions: . sodium chloride 500 mL/hr at 04/24/20 1434  . sodium chloride       LOS: 4 days    Time spent: 38 minutes spent on chart review, discussion with nursing staff, consultants, updating family and interview/physical exam; more than 50% of that time was spent in counseling and/or coordination of care.    Kaylee Wombles J [place] DO [facility] Available via Epic secure chat 7am-7pm After these hours, please refer to coverage provider listed on [email or web address] 04/25/2020, 10:37 AM

## 2020-04-25 NOTE — Progress Notes (Signed)
PT Cancellation Note  Patient Details Name: CARMICHAEL BURDETTE MRN: [id number] DOB: Mar 26, 1946   Cancelled Treatment:    Reason Eval/Treat Not Completed: RN deferred pt getting blood and Patient about to go to procedure or test/unavailable  Lyanne Co, DPT Acute Rehabilitation Services [phone]  [place] 04/25/2020, 12:36 PM

## 2020-04-25 NOTE — Progress Notes (Signed)
  Pt doesn't want to wear CPAP tonight  °

## 2020-04-25 NOTE — Evaluation (Signed)
Occupational Therapy Evaluation Patient Details Name: John [name] MRN: [id number] DOB: 1945-12-10 Today's Date: 04/25/2020    History of Present Illness 13y male c/o weakness, fatigue, and constipation; PCP found him to have critically high calcium levels and referred him to the ED. New mediastinal and liver masses found per CT. PMH HTN, R knee pain, foot surgery   Clinical Impression   Pt PTA: Pt living at home with spouse and was independent with ADL, mobility and driving. Pt currently, requires increased time, but overall is supervisionA for sitting/standing ADL tasks. Pt performing mobility to and from sink and in hallway with minguardA. Pt initiated ways to conserve energy at home and plans to use 3in1 in shower. Pt education for LB ADL and AE available next session. Pt and spouse agree on d/c home and no follow-up OT required.  HR 125-146 BPM with exertion. OT following acutely.    Follow Up Recommendations  No OT follow up;Supervision - Intermittent    Equipment Recommendations  3 in 1 bedside commode    Recommendations for Other Services       Precautions / Restrictions Precautions Precautions: Other (comment) Precaution Comments: watch HR Restrictions Weight Bearing Restrictions: No      Mobility Bed Mobility Overal bed mobility: Needs Assistance Bed Mobility: Supine to Sit;Sit to Supine     Supine to sit: Supervision Sit to supine: Supervision   General bed mobility comments: no physical assist    Transfers Overall transfer level: Needs assistance Equipment used: Rolling walker (2 wheeled) Transfers: Sit to/from Stand Sit to Stand: Min guard         General transfer comment: minguardA     Balance Overall balance assessment: Needs assistance Sitting-balance support: No upper extremity supported Sitting balance-Leahy Scale: Good       Standing balance-Leahy Scale: Fair Standing balance comment: standing with BUE supports                             ADL either performed or assessed with clinical judgement   ADL Overall ADL's : At baseline                                       General ADL Comments: Pt requires increased time, but overall is supervisionA for sitting/standing tasks. Pt performing mobility to and from sink and in hallway with minguardA.     Vision Baseline Vision/History: Wears glasses Wears Glasses: At all times Patient Visual Report: No change from baseline Vision Assessment?: No apparent visual deficits     Perception     Praxis      Pertinent Vitals/Pain Pain Assessment: Faces Pain Score: 0-No pain Pain Intervention(s): Monitored during session     Hand Dominance Right   Extremity/Trunk Assessment Upper Extremity Assessment Upper Extremity Assessment: Overall WFL for tasks assessed   Lower Extremity Assessment Lower Extremity Assessment: Overall WFL for tasks assessed   Cervical / Trunk Assessment Cervical / Trunk Assessment: Normal   Communication Communication Communication: No difficulties   Cognition Arousal/Alertness: Awake/alert Behavior During Therapy: WFL for tasks assessed/performed Overall Cognitive Status: Within Functional Limits for tasks assessed                                     General Comments  HR 125-146 BPM  with exertion.    Exercises     Shoulder Instructions      Home Living Family/patient expects to be discharged to:: Private residence Living Arrangements: Spouse/significant other Available Help at Discharge: Family;Available 24 hours/day Type of Home: House Home Access: Stairs to enter [employer] of Steps: 4-5 with B rails Entrance Stairs-Rails: Can reach both Home Layout: One level Alternate Level Stairs-Number of Steps: does have a single step (threshold) into the sun room   Bathroom Shower/Tub: Teacher, early years/pre: Handicapped height     Home Equipment: None           Prior Functioning/Environment Level of Independence: Independent        Comments: no falls; active in the community        OT Problem List: Decreased strength;Decreased activity tolerance      OT Treatment/Interventions: Self-care/ADL training;Therapeutic exercise;Energy conservation;DME and/or AE instruction;Therapeutic activities;Patient/family education;Balance training    OT Goals(Current goals can be found in the care plan section) Acute Rehab OT Goals Patient Stated Goal: go home today OT Goal Formulation: With patient Time For Goal Achievement: 05/09/20 Potential to Achieve Goals: Good ADL Goals Pt Will Perform Lower Body Dressing: sitting/lateral leans;sit to/from stand;with min guard assist Pt Will Perform Tub/Shower Transfer: with min guard assist;ambulating;rolling walker Additional ADL Goal #1: Pt will complete ADL tasks x5 mins with no seated rest break and set-upA only.  OT Frequency: Min 2X/week   Barriers to D/C:            Co-evaluation              AM-PAC OT "6 Clicks" Daily Activity     Outcome Measure Help from another person eating meals?: None Help from another person taking care of personal grooming?: None Help from another person toileting, which includes using toliet, bedpan, or urinal?: A Little Help from another person bathing (including washing, rinsing, drying)?: A Little Help from another person to put on and taking off regular upper body clothing?: None Help from another person to put on and taking off regular lower body clothing?: A Little 6 Click Score: 21   End of Session Equipment Utilized During Treatment: Gait belt;Rolling walker Nurse Communication: Mobility status  Activity Tolerance: Patient tolerated treatment well Patient left: in bed;with call bell/phone within reach  OT Visit Diagnosis: Muscle weakness (generalized) (M62.81)                Time: 8325-4982 OT Time Calculation (min): 32 min Charges:  OT General  Charges $OT Visit: 1 Visit OT Evaluation $OT Eval Moderate Complexity: 1 Mod OT Treatments $Self Care/Home Management : 8-22 mins  Jefferey Pica, OTR/L Acute Rehabilitation Services Pager: [phone] Office: [phone]   Rockey Guarino C 04/25/2020, 5:30 PM

## 2020-04-25 NOTE — Progress Notes (Signed)
   04/25/20 1721  Assess: MEWS Score  Temp 99.5 F (37.5 C)  BP (!) 167/75  Pulse Rate (!) 108  Resp (!) 24  SpO2 97 %  O2 Device Room Air  Assess: MEWS Score  MEWS Temp 0  MEWS Systolic 0  MEWS Pulse 1  MEWS RR 1  MEWS LOC 0  MEWS Score 2  MEWS Score Color Yellow  Assess: if the MEWS score is Yellow or Red  Were vital signs taken at a resting state? Yes  Focused Assessment No change from prior assessment  Early Detection of Sepsis Score *See Row Information* Low  MEWS guidelines implemented *See Row Information* Yes  Treat  Pain Scale 0-10  Pain Score 0  Take Vital Signs  Increase Vital Sign Frequency  Yellow: Q 2hr X 2 then Q 4hr X 2, if remains yellow, continue Q 4hrs  Escalate  MEWS: Escalate Yellow: discuss with charge nurse/RN and consider discussing with provider and RRT  Notify: Charge Nurse/RN  Name of Charge Nurse/RN Notified Dillon Bjork  Date Charge Nurse/RN Notified 04/25/20  Time Charge Nurse/RN Notified 5003  Notify: Provider  Provider Name/Title Eric [place]  Date Provider Notified 04/25/20  Time Provider Notified 1759  Notification Type Page  Notification Reason Change in status  Response No new orders  Date of Provider Response 04/25/20  Time of Provider Response [id number]  Document  Patient Outcome Other (Comment) (Yellow MEWS protocol started)  Progress note created (see row info) Yes

## 2020-04-25 NOTE — Progress Notes (Signed)
John [name]   DOB:1945/07/20   CB#:[id number]   OHY#:[id number]  Oncology follow up   Subjective: Pt has received 1u blood transfusion today, CT showed hemoperitoneum. He states his abdominal pain is mild, tolerable. He was able to participate OT today.  He tolerated rasburicase very well last night.   Objective:  Vitals:   04/25/20 1721 04/25/20 1959  BP: (!) 167/75 (!) 142/68  Pulse: (!) 108 98  Resp: (!) 24 19  Temp: 99.5 F (37.5 C) 98.9 F (37.2 C)  SpO2: 97% 95%    Body mass index is 31.02 kg/m.  Intake/Output Summary (Last 24 hours) at 04/25/2020 2125 Last data filed at 04/25/2020 1802 Gross per 24 hour  Intake 2133.01 ml  Output 1207 ml  Net 926.01 ml     Sclerae unicteric  Oropharynx clear  No peripheral adenopathy  Lungs clear -- no rales or rhonchi  Heart regular rate and rhythm  Abdomen soft, mild tenderness at RUQ  MSK no focal spinal tenderness, no peripheral edema  Neuro nonfocal    CBG (last 3)  No results for input(s): GLUCAP in the last 72 hours.   Labs:  Urine Studies No results for input(s): UHGB, CRYS in the last 72 hours.  Invalid input(s): UACOL, UAPR, USPG, UPH, UTP, UGL, UKET, UBIL, UNIT, UROB, ULEU, UEPI, UWBC, URBC, UBAC, CAST, [place], [place]  Basic Metabolic Panel: Recent Labs  Lab 04/20/20 1818 04/20/20 1818 04/21/20 0059 04/21/20 0059 04/22/20 0210 04/22/20 0210 04/23/20 0232 04/23/20 0232 04/24/20 0329 04/25/20 0027  NA 130*   < > 136  --  136  --  138  --  138 132*  K 4.1   < > 3.6   < > 3.4*   < > 3.9   < > 3.8 3.7  CL 91*   < > 101  --  104  --  108  --  109 104  CO2 28   < > 27  --  24  --  21*  --  21* 20*  GLUCOSE 91   < > 89  --  86  --  79  --  87 101*  BUN 43*   < > 41*  --  41*  --  42*  --  47* 46*  CREATININE 2.67*   < > 2.61*  --  2.74*  --  2.75*  --  2.98* 2.76*  CALCIUM >15.0*   < > >15.0*  --  13.1*  --  12.1*  --  11.3* 10.3  MG 2.0  --   --   --   --   --  1.6*  --  1.9  --   PHOS  --   --  4.0  --    --   --   --   --   --   --    < > = values in this interval not displayed.   GFR Estimated Creatinine Clearance: 29.3 mL/min (A) (by C-G formula based on SCr of 2.76 mg/dL (H)). Liver Function Tests: Recent Labs  Lab 04/21/20 0059 04/22/20 0210 04/23/20 0232 04/24/20 0329 04/25/20 0027  AST 54* 66* 72* 106* 82*  ALT 30 43 51* 64* 51*  ALKPHOS 77 95 127* 99 88  BILITOT 1.5* 1.4* 1.4* 1.1 1.1  PROT 5.1* 4.5* 4.5* 4.2* 4.3*  ALBUMIN 2.9* 2.6* 2.5* 2.4* 2.4*   No results for input(s): LIPASE, AMYLASE in the last 168 hours. No results for input(s): AMMONIA in the last  168 hours. Coagulation profile Recent Labs  Lab 04/22/20 0210  INR 1.3*    CBC: Recent Labs  Lab 04/20/20 1818 04/20/20 1818 04/21/20 0059 04/21/20 0059 04/22/20 0210 04/22/20 0210 04/23/20 1801 04/23/20 1801 04/23/20 2154 04/24/20 0329 04/24/20 1209 04/25/20 0027 04/25/20 1519  WBC 5.8   < > 4.3  --  4.0  --  6.5  --   --  5.0  --  4.0  --   NEUTROABS 4.4  --  3.2  --   --   --   --   --   --   --   --   --   --   HGB 12.2*   < > 10.9*   < > 10.5*   < > 9.5*   < > 8.2* 8.3* 8.3* 7.2* 8.3*  HCT 36.3*   < > 31.6*   < > 31.8*   < > 29.2*   < > 24.7* 24.8* 24.2* 21.3* 24.7*  MCV 87.3   < > 86.6  --  86.4  --  89.6  --   --  86.7  --  85.2  --   PLT 150   < > 126*  --  112*  --  134*  --   --  129*  --  116*  --    < > = values in this interval not displayed.   Cardiac Enzymes: No results for input(s): CKTOTAL, CKMB, CKMBINDEX, TROPONINI in the last 168 hours. BNP: Invalid input(s): POCBNP CBG: Recent Labs  Lab 04/20/20 1857  GLUCAP 92   D-Dimer No results for input(s): DDIMER in the last 72 hours. Hgb A1c No results for input(s): HGBA1C in the last 72 hours. Lipid Profile No results for input(s): CHOL, HDL, LDLCALC, TRIG, CHOLHDL, LDLDIRECT in the last 72 hours. Thyroid function studies No results for input(s): TSH, T4TOTAL, T3FREE, THYROIDAB in the last 72 hours.  Invalid input(s):  FREET3 Anemia work up No results for input(s): VITAMINB12, FOLATE, FERRITIN, TIBC, IRON, RETICCTPCT in the last 72 hours. Microbiology Recent Results (from the past 240 hour(s))  Respiratory Panel by RT PCR (Flu A&B, Covid) - Nasopharyngeal Swab     Status: None   Collection Time: 04/20/20  6:25 PM   Specimen: Nasopharyngeal Swab  Result Value Ref Range Status   SARS Coronavirus 2 by RT PCR NEGATIVE NEGATIVE Final    Comment: (NOTE) SARS-CoV-2 target nucleic acids are NOT DETECTED.  The SARS-CoV-2 RNA is generally detectable in upper respiratoy specimens during the acute phase of infection. The lowest concentration of SARS-CoV-2 viral copies this assay can detect is 131 copies/mL. A negative result does not preclude SARS-Cov-2 infection and should not be used as the sole basis for treatment or other patient management decisions. A negative result may occur with  improper specimen collection/handling, submission of specimen other than nasopharyngeal swab, presence of viral mutation(s) within the areas targeted by this assay, and inadequate number of viral copies (<131 copies/mL). A negative result must be combined with clinical observations, patient history, and epidemiological information. The expected result is Negative.  Fact Sheet for Patients:  PinkCheek.be  Fact Sheet for Healthcare Providers:  GravelBags.it  This test is no t yet approved or cleared by the [place] FDA and  has been authorized for detection and/or diagnosis of SARS-CoV-2 by FDA under an Emergency Use Authorization (EUA). This EUA will remain  in effect (meaning this test can be used) for the duration of the COVID-19 declaration under Section  564(b)(1) of the Act, 21 U.S.C. section 360bbb-3(b)(1), unless the authorization is terminated or revoked sooner.     Influenza A by PCR NEGATIVE NEGATIVE Final   Influenza B by PCR NEGATIVE NEGATIVE  Final    Comment: (NOTE) The Xpert Xpress SARS-CoV-2/FLU/RSV assay is intended as an aid in  the diagnosis of influenza from Nasopharyngeal swab specimens and  should not be used as a sole basis for treatment. Nasal washings and  aspirates are unacceptable for Xpert Xpress SARS-CoV-2/FLU/RSV  testing.  Fact Sheet for Patients: PinkCheek.be  Fact Sheet for Healthcare Providers: GravelBags.it  This test is not yet approved or cleared by the [place] FDA and  has been authorized for detection and/or diagnosis of SARS-CoV-2 by  FDA under an Emergency Use Authorization (EUA). This EUA will remain  in effect (meaning this test can be used) for the duration of the  Covid-19 declaration under Section 564(b)(1) of the Act, 21  U.S.C. section 360bbb-3(b)(1), unless the authorization is  terminated or revoked. Performed at [facility], [place] [address], [place], [place] [address]       Studies:  CT ABDOMEN PELVIS WO CONTRAST  Result Date: 04/25/2020 CLINICAL DATA:  74 year old with recent biopsy of hepatic mass on 04/23/2020. Abdominal pain with down trending hemoglobin level. EXAM: CT ABDOMEN AND PELVIS WITHOUT CONTRAST TECHNIQUE: Multidetector CT imaging of the abdomen and pelvis was performed following the standard protocol without IV contrast. COMPARISON:  CT abdomen and pelvis 04/20/2020 FINDINGS: Lower chest: New small bilateral pleural effusions. Hepatobiliary: Again noted is a poorly defined central right hepatic lesion that measures at least 7.4 cm. This lesion was recently biopsied. There is new perihepatic fluid along the right hepatic lobe. No acute abnormality to the liver. Pancreas: There is a mass or lymphadenopathy near the pancreatic head and similar to the recent comparison examination. Concern for nodularity near the pancreatic tail region which is similar to the recent comparison examination. Spleen: Poorly  defined low-density lesion in the anterior inferior aspect of the spleen and concerning for a splenic lesion. Adrenals/Urinary Tract: Small nodule associated with the right adrenal gland. Mild fullness left adrenal gland. Probable cyst in left kidney upper pole. Negative for hydronephrosis. Fluid in the urinary bladder. Stomach/Bowel: Normal appearance of stomach. No evidence for bowel obstruction focal bowel inflammation. Vascular/Lymphatic: Abdominal aorta measures up to 2.8 cm with atherosclerotic calcifications. Again noted is extensive para-aortic lymphadenopathy. Enlarged lymph nodes in the left iliac nodal chain. Again noted is a large mass or nodal mass in the central abdomen near the pancreatic head that measures up to 6.3 cm. Additional large mesenteric lymph nodes in the right abdomen on sequence 3, image 49. Probable enlarged lymph nodes in gastrohepatic ligament. Splenule or nodal lesion anterior to the spleen on image 21, sequence 3. Reproductive: Prostate is enlarged. Other: There is slightly dense ascites in the lower abdomen and pelvis. Findings are most compatible with hemoperitoneum from recent liver biopsy. Small amount of high-density fluid in the left lateral lower quadrant. Small amount of fluid around the liver. The most dense ascites measures roughly 26 Hounsfield units. Small amount of high-density fluid or blood extending into the right inguinal canal. Negative for free air. Musculoskeletal: Severe anterolisthesis at L4-L5. Multilevel degenerative changes in the lumbar spine. IMPRESSION: 1. Development of high-density ascites in the abdomen and pelvis. Findings are most compatible with a small to moderate amount of hemoperitoneum and related to the recent liver biopsy. 2. Extensive lymphadenopathy throughout the abdomen and  pelvis with a large mass or nodal mass near the pancreatic head. There are additional lesions involving the liver and spleen. Findings are suggestive for metastatic  disease. 3. Development of small bilateral pleural effusions. These results were called by telephone at the time of interpretation on 04/25/2020 at 1:43 pm to provider ERIC [place] , who verbally acknowledged these results. Electronically Signed   By: Markus Daft M.D.   On: 04/25/2020 13:50    Assessment: 74 y.o. Caucasian male with past medical history of hypertension and BPH presented with severe hypocalcemia, large mediastinal and liver mass, and extensive retroperitoneal adenopathy.  1.  Severe hypercalcemia, likely malignancy related, resolved  2.  Large mediastinal and liver mass, mesenteric and retroperitoneal lymphadenopathy, concerning for malignancy, s/p liver biopsy 11/15 3. Tumor lysis syndrome  4. HTN 4. hemoperitoneum second to liver biopsy, s/p 1u blood today  5. AKI    Recommendations: -His uric acid level was 10.5 this morning, down from 11.9 yesterday, it was checked only 2-3 hrs after rasburicase, so it would likely come down much more tomorrow morning. 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 Will update tomorrow  -if this is high grade lymphoma, he may need inpatient chemo. -I will f/u when his path returns.  -continue IVF for now     Truitt Merle, MD 04/25/2020

## 2020-04-26 ENCOUNTER — Other Ambulatory Visit: Payer: Self-pay | Admitting: Hematology

## 2020-04-26 ENCOUNTER — Other Ambulatory Visit: Payer: Self-pay

## 2020-04-26 DIAGNOSIS — C8330 Diffuse large B-cell lymphoma, unspecified site: Secondary | ICD-10-CM | POA: Insufficient documentation

## 2020-04-26 DIAGNOSIS — C8593 Non-Hodgkin lymphoma, unspecified, intra-abdominal lymph nodes: Secondary | ICD-10-CM

## 2020-04-26 DIAGNOSIS — C8590 Non-Hodgkin lymphoma, unspecified, unspecified site: Secondary | ICD-10-CM

## 2020-04-26 LAB — COMPREHENSIVE METABOLIC PANEL
ALT: 47 U/L — ABNORMAL HIGH (ref 0–44)
AST: 65 U/L — ABNORMAL HIGH (ref 15–41)
Albumin: 2.4 g/dL — ABNORMAL LOW (ref 3.5–5.0)
Alkaline Phosphatase: 89 U/L (ref 38–126)
Anion gap: 7 (ref 5–15)
BUN: 40 mg/dL — ABNORMAL HIGH (ref 8–23)
CO2: 21 mmol/L — ABNORMAL LOW (ref 22–32)
Calcium: 10.3 mg/dL (ref 8.9–10.3)
Chloride: 111 mmol/L (ref 98–111)
Creatinine, Ser: 2.36 mg/dL — ABNORMAL HIGH (ref 0.61–1.24)
GFR, Estimated: 29 mL/min — ABNORMAL LOW (ref >60)
Glucose, Bld: 89 mg/dL (ref 70–99)
Potassium: 3.7 mmol/L (ref 3.5–5.1)
Sodium: 139 mmol/L (ref 135–145)
Total Bilirubin: 1.3 mg/dL — ABNORMAL HIGH (ref 0.3–1.2)
Total Protein: 4.4 g/dL — ABNORMAL LOW (ref 6.5–8.1)

## 2020-04-26 LAB — BPAM RBC
Blood Product Expiration Date: 202112132359
ISSUE DATE / TIME: 202111171005
Unit Type and Rh: 6200

## 2020-04-26 LAB — TYPE AND SCREEN
ABO/RH(D): A POS
Antibody Screen: NEGATIVE
Unit division: 0

## 2020-04-26 LAB — CBC
HCT: 23.9 % — ABNORMAL LOW (ref 39.0–52.0)
Hemoglobin: 8.1 g/dL — ABNORMAL LOW (ref 13.0–17.0)
MCH: 29.5 pg (ref 26.0–34.0)
MCHC: 33.9 g/dL (ref 30.0–36.0)
MCV: 86.9 fL (ref 80.0–100.0)
Platelets: 122 10*3/uL — ABNORMAL LOW (ref 150–400)
RBC: 2.75 MIL/uL — ABNORMAL LOW (ref 4.22–5.81)
RDW: 13.7 % (ref 11.5–15.5)
WBC: 4 10*3/uL (ref 4.0–10.5)
nRBC: 0 % (ref 0.0–0.2)

## 2020-04-26 LAB — PTH-RELATED PEPTIDE: PTH-related peptide: <2 pmol/L

## 2020-04-26 LAB — URIC ACID: Uric Acid, Serum: 6.1 mg/dL (ref 3.7–8.6)

## 2020-04-26 MED ORDER — PREDNISONE 20 MG PO TABS: 60.0000 mg | ORAL_TABLET | Freq: Every day | ORAL | 0 refills | Status: DC

## 2020-04-26 MED ORDER — PANTOPRAZOLE SODIUM 40 MG PO TBEC: 40.0000 mg | DELAYED_RELEASE_TABLET | Freq: Every day | ORAL | 11 refills | Status: DC

## 2020-04-26 MED ORDER — ALLOPURINOL 100 MG PO TABS
100.0000 mg | ORAL_TABLET | Freq: Every day | ORAL | 0 refills | Status: DC
Start: 2020-04-26 — End: 2020-06-07

## 2020-04-26 MED FILL — PANTOPRAZOLE SOD DR 40 MG T: 40 | 30 days supply | Qty: 30 | Fill #0

## 2020-04-26 MED FILL — ALLOPURINOL 100 MG TABLET: 100 | 30 days supply | Qty: 30 | Fill #0

## 2020-04-26 MED FILL — predniSONE 20 MG TABS: 20 | 5 days supply | Qty: 15 | Fill #0

## 2020-04-26 NOTE — Progress Notes (Signed)
PROGRESS NOTE    John [name]  ACZ:[id number] DOB: [birth year] DOA: 04/20/2020 PCP: Christain Sacramento, MD    Brief Narrative:  John [name] is a 74 year old male with past medical history notable for essential hypertension, OSA on CPAP who presented to the ED with complaints of weakness and constipation over the past 2-3 weeks.  Was seen by his PCP and given laxatives; which assisted in resolution of his constipation.  Given persistent weakness, he proceeded back to his PCP with lab testing performed that showed critically high calcium levels and was then instructed to come to the emergency department.  Patient did not take any calcium supplementation at home.  His wife also reports that he is been slightly confused over this timeframe as well.  Denies fever/chills/night sweats, no nausea vomiting, shortness of breath or chest pain.  In the ED, creatinine 2.6 which is elevated from January 2020.  Calcium greater than 15, hemoglobin 12.6, platelets 147.  CT chest/abdomen/pelvis without contrast notable for anterior mediastinal mass and right lobe hypodensity.  Patient was started on 2 L NS bolus.  Hospitalist service consulted for admission for further evaluation and treatment.   Assessment & Plan:   Principal Problem:   Hypercalcemia Active Problems:   Essential hypertension   ARF (acute renal failure) (HCC)   Mediastinal mass   Liver mass   Tumor lysis syndrome  Addendum 1400:  Received call from radiology regarding CT abdomen/pelvis without contrast.  Discussed with radiology, Dr. Anselm Pancoast. Findings consistent with small-moderate hemoperitoneum related to recent liver biopsy with extensive lymphadenopathy throughout abdomen/pelvis with large mass or nodal mass near pancreatic head.  Also additional lesions involving liver and spleen suggestive of metastatic disease.  Patient has been transfused with 1 unit PRBC with repeat hemoglobin 8.3; up from 7.2 this morning.  Updated patient and  spouse at bedside this afternoon. --Hemodynamically stable, last BP 143/67 --Repeat hemoglobin at 2100  Anterior mediastinal mass Right hepatic hypodensity Patient presenting with progressive weakness and constipation over the past 2-3 weeks.  Reports 10 pound weight loss over the past month.  Was noted to have severely elevated calcium level at PCP visit.  CT chest/abdomen notable for large anterior mediastinal mass with right liver lobe hypodensity and extensive mesenteric and retroperitoneal adenopathy.  LDH elevated 416. AFP 2.0 (normal), CEA 1.9 (normal), CA 19-9 <2. Concern for lymphoma versus thymoma versus germinal cell tumor.  --Medical oncology, Dr. Burr Medico following, appreciate assistance --s/p ultrasound-guided liver biopsy 04/23/2020 by IR --Pathology pending  Tumor lysis syndrome Uric acid elevated at 11.9. --s/p rasburicase 11/16 --Uric acid 11.9>10.5>6.1 --IV fluids now discontinued --Continue close monitoring of renal function and uric acid level daily  Post procedure hypotension and acute blood loss anemia Following ultrasound-guided liver biopsy on 04/23/2020, on return to room, patient was hypotensive with BP 84/38.  Patient was asymptomatic besides some localized discomfort surrounding the biopsy site.  Patient was given fentanyl and Versed during procedure.  Discussed with interventional radiology, Dr. Serafina Royals, and there was some bleeding during the procedure as a liver mass was very vascular, in which he utilized Gelfoam slurry needle track embolization.  Repeat CT abdomen/pelvis without contrast on 04/25/2020 with findings consistent with small-moderate hemoperitoneum related to recent liver biopsy; discussed findings with radiology, Dr. Anselm Pancoast.  Transfused 1 unit PRBC on 04/25/2020. --Hgb 10.5>9.5>8.2>8.3>8.3>7.2>9.6>9.2 --Hemodynamically stable, BP 143/76 this morning --Continue to hold home antihypertensives, metoprolol, Cardura, benazepril --Follow CBC daily  Severe  hypercalcemia: resolved Suspect hypercalcemia of malignancy given mediastinal/hepatic mass.  Not on calcium or vitamin D supplements.  Vitamin D 25 hydroxy 21.57 (low). Received 2L NS bolus followed by pamidronate 90 mg IV on 04/21/2020. --Ca >15, 13.1>12.1>11.3>10.3>10.3 --IV fluids discontinued --Continue close monitoring of calcium level  Acute renal failure Creatinine 2.67 on admission, baseline 0.78 06/2018.  Ultrasound with 2.4 cm benign-appearing left renal cyst, otherwise normal appearance; no mass or hydronephrosis. FeNa 3.0%, consistent with intrinsic etiology; likely from underlying tumor lysis syndrome --Cr 2.67>2.61>2.74>2.75>2.98>2.76>2.36 --Holding home benazepril --IV fluids now discontinued --Avoid nephrotoxins, renally dose all medications --Close monitoring of urine output --BMP daily  Anemia Thrombocytopenia Hemoglobin 10.9, platelets 126 on admission.  Suspect etiology from malignancy as above.  Iron 59, TIBC 286, ferritin 1266, folate 27.9, B12 497.  Peripheral smear review by pathology with normocytic anemia with thrombocytopenia.SPEP negative --continue to monitor CBC daily  Essential hypertension --Holding benazepril as above for acute renal failure --Holding metoprolol tartrate 12.5 mg p.o. twice daily for borderline hypotension  BPH: holding doxazosin 4 mg p.o. daily  OSA --Continue nocturnal CPAP; has been refusing inpatient  Weakness/debility, deconditioning: --PT/OT following.  DVT prophylaxis: SCDs Code Status: Full code Family Communication: Discussed with spouse present at bedside  Disposition Plan:  Status is: Inpatient  Remains inpatient appropriate because:Persistent severe electrolyte disturbances, Ongoing diagnostic testing needed not appropriate for outpatient work up, Unsafe d/c plan, IV treatments appropriate due to intensity of illness or inability to take PO and Inpatient level of care appropriate due to severity of  illness   Dispo:  Patient From: Home  Planned Disposition: Home with Health Care Svc  Expected discharge date: 04/28/20  Medically stable for discharge: No   Consultants:   Medical oncology, Dr. Burr Medico  Interventional radiology  Procedures:   Ultrasound-guided liver biopsy, IR 04/23/2020  Antimicrobials:   None   Subjective: Patient seen and examined at bedside, resting comfortably in bedside chair.  Patient and spouse emotional this morning regarding concerning diagnosis of metastatic cancer.  Patient spouse would like to discharge today.  Updated oncology, Dr. Annamaria Boots this morning.  She would like them to remain hospitalized until she sees him later today; concern if high-grade lymphoma with need induction chemotherapy inpatient.  Additionally overnight, patient with respiratory distress improved with sitting up; IV fluids discontinued and symptoms now resolved.  Calcium level remains normal, and uric acid level now has normalized.  No other complaints or concerns at this time. Denies headache, no visual changes, no chest pain, no palpitations, no shortness of breath, no fever/chills/night sweats, no nausea/vomiting/diarrhea, no paresthesias.  No acute events overnight per nursing staff.  Objective: Vitals:   04/25/20 2150 04/26/20 0130 04/26/20 0545 04/26/20 0900  BP: 132/68 130/62 (!) 143/76 (!) 145/76  Pulse: 96 94 87 (!) 108  Resp: 19 20 20 18   Temp: 98.3 F (36.8 C) 98.4 F (36.9 C) 99.4 F (37.4 C) 98.8 F (37.1 C)  TempSrc:   Oral Oral  SpO2: 94% 95% 97% 99%  Weight: 101 kg     Height:        Intake/Output Summary (Last 24 hours) at 04/26/2020 1157 Last data filed at 04/26/2020 1497 Gross per 24 hour  Intake 2173.01 ml  Output 1925 ml  Net 248.01 ml   Filed Weights   04/21/20 2142 04/22/20 0500 04/25/20 2150  Weight: 100.9 kg 100.9 kg 101 kg    Examination:  General exam: Appears calm and comfortable  Respiratory system: Clear to auscultation.  Respiratory effort normal.  Oxygenating well on room air Cardiovascular system:  S1 & S2 heard, RRR. No JVD, murmurs, rubs, gallops or clicks.  Trace lower extremity edema bilaterally to mid shin Gastrointestinal system: Abdomen is nondistended, soft and nontender. No organomegaly or masses felt. Normal bowel sounds heard.  Biopsy site noted, no surrounding erythema, no fluctuance, no ecchymosis Central nervous system: Alert and oriented. No focal neurological deficits. Extremities: Symmetric 5 x 5 power. Skin: No rashes, lesions or ulcers Psychiatry: Judgement and insight appear normal. Mood & affect appropriate.     Data Reviewed: I have personally reviewed following labs and imaging studies  CBC: Recent Labs  Lab 04/20/20 1818 04/20/20 1818 04/21/20 0059 04/21/20 0059 04/22/20 0210 04/22/20 0210 04/23/20 1801 04/23/20 2154 04/24/20 0329 04/24/20 0329 04/24/20 1209 04/25/20 0027 04/25/20 1519 04/25/20 2127 04/26/20 0146  WBC 5.8   < > 4.3   < > 4.0  --  6.5  --  5.0  --   --  4.0  --   --  4.0  NEUTROABS 4.4  --  3.2  --   --   --   --   --   --   --   --   --   --   --   --   HGB 12.2*   < > 10.9*   < > 10.5*   < > 9.5*   < > 8.3*   < > 8.3* 7.2* 8.3* 8.1* 8.1*  HCT 36.3*   < > 31.6*   < > 31.8*   < > 29.2*   < > 24.8*   < > 24.2* 21.3* 24.7* 24.1* 23.9*  MCV 87.3   < > 86.6   < > 86.4  --  89.6  --  86.7  --   --  85.2  --   --  86.9  PLT 150   < > 126*   < > 112*  --  134*  --  129*  --   --  116*  --   --  122*   < > = values in this interval not displayed.   Basic Metabolic Panel: Recent Labs  Lab 04/20/20 1818 04/20/20 1818 04/21/20 0059 04/21/20 0059 04/22/20 0210 04/23/20 0232 04/24/20 0329 04/25/20 0027 04/26/20 0146  NA 130*   < > 136   < > 136 138 138 132* 139  K 4.1   < > 3.6   < > 3.4* 3.9 3.8 3.7 3.7  CL 91*   < > 101   < > 104 108 109 104 111  CO2 28   < > 27   < > 24 21* 21* 20* 21*  GLUCOSE 91   < > 89   < > 86 79 87 101* 89  BUN 43*   < > 41*    < > 41* 42* 47* 46* 40*  CREATININE 2.67*   < > 2.61*   < > 2.74* 2.75* 2.98* 2.76* 2.36*  CALCIUM >15.0*   < > >15.0*   < > 13.1* 12.1* 11.3* 10.3 10.3  MG 2.0  --   --   --   --  1.6* 1.9  --   --   PHOS  --   --  4.0  --   --   --   --   --   --    < > = values in this interval not displayed.   GFR: Estimated Creatinine Clearance: 34.3 mL/min (A) (by C-G formula based on SCr of 2.36 mg/dL (H)). Liver  Function Tests: Recent Labs  Lab 04/22/20 0210 04/23/20 0232 04/24/20 0329 04/25/20 0027 04/26/20 0146  AST 66* 72* 106* 82* 65*  ALT 43 51* 64* 51* 47*  ALKPHOS 95 127* 99 88 89  BILITOT 1.4* 1.4* 1.1 1.1 1.3*  PROT 4.5* 4.5* 4.2* 4.3* 4.4*  ALBUMIN 2.6* 2.5* 2.4* 2.4* 2.4*   No results for input(s): LIPASE, AMYLASE in the last 168 hours. No results for input(s): AMMONIA in the last 168 hours. Coagulation Profile: Recent Labs  Lab 04/22/20 0210  INR 1.3*   Cardiac Enzymes: No results for input(s): CKTOTAL, CKMB, CKMBINDEX, TROPONINI in the last 168 hours. BNP (last 3 results) No results for input(s): PROBNP in the last 8760 hours. HbA1C: No results for input(s): HGBA1C in the last 72 hours. CBG: Recent Labs  Lab 04/20/20 1857  GLUCAP 92   Lipid Profile: No results for input(s): CHOL, HDL, LDLCALC, TRIG, CHOLHDL, LDLDIRECT in the last 72 hours. Thyroid Function Tests: No results for input(s): TSH, T4TOTAL, FREET4, T3FREE, THYROIDAB in the last 72 hours. Anemia Panel: No results for input(s): VITAMINB12, FOLATE, FERRITIN, TIBC, IRON, RETICCTPCT in the last 72 hours. Sepsis Labs: No results for input(s): PROCALCITON, LATICACIDVEN in the last 168 hours.  Recent Results (from the past 240 hour(s))  Respiratory Panel by RT PCR (Flu A&B, Covid) - Nasopharyngeal Swab     Status: None   Collection Time: 04/20/20  6:25 PM   Specimen: Nasopharyngeal Swab  Result Value Ref Range Status   SARS Coronavirus 2 by RT PCR NEGATIVE NEGATIVE Final    Comment:  (NOTE) SARS-CoV-2 target nucleic acids are NOT DETECTED.  The SARS-CoV-2 RNA is generally detectable in upper respiratoy specimens during the acute phase of infection. The lowest concentration of SARS-CoV-2 viral copies this assay can detect is 131 copies/mL. A negative result does not preclude SARS-Cov-2 infection and should not be used as the sole basis for treatment or other patient management decisions. A negative result may occur with  improper specimen collection/handling, submission of specimen other than nasopharyngeal swab, presence of viral mutation(s) within the areas targeted by this assay, and inadequate number of viral copies (<131 copies/mL). A negative result must be combined with clinical observations, patient history, and epidemiological information. The expected result is Negative.  Fact Sheet for Patients:  PinkCheek.be  Fact Sheet for Healthcare Providers:  GravelBags.it  This test is no t yet approved or cleared by the [place] FDA and  has been authorized for detection and/or diagnosis of SARS-CoV-2 by FDA under an Emergency Use Authorization (EUA). This EUA will remain  in effect (meaning this test can be used) for the duration of the COVID-19 declaration under Section 564(b)(1) of the Act, 21 U.S.C. section 360bbb-3(b)(1), unless the authorization is terminated or revoked sooner.     Influenza A by PCR NEGATIVE NEGATIVE Final   Influenza B by PCR NEGATIVE NEGATIVE Final    Comment: (NOTE) The Xpert Xpress SARS-CoV-2/FLU/RSV assay is intended as an aid in  the diagnosis of influenza from Nasopharyngeal swab specimens and  should not be used as a sole basis for treatment. Nasal washings and  aspirates are unacceptable for Xpert Xpress SARS-CoV-2/FLU/RSV  testing.  Fact Sheet for Patients: PinkCheek.be  Fact Sheet for Healthcare  Providers: GravelBags.it  This test is not yet approved or cleared by the [place] FDA and  has been authorized for detection and/or diagnosis of SARS-CoV-2 by  FDA under an Emergency Use Authorization (EUA). This EUA will remain  in  effect (meaning this test can be used) for the duration of the  Covid-19 declaration under Section 564(b)(1) of the Act, 21  U.S.C. section 360bbb-3(b)(1), unless the authorization is  terminated or revoked. Performed at [facility], [place] [address], [place], [place] [address]          Radiology Studies: CT ABDOMEN PELVIS WO CONTRAST  Result Date: 04/25/2020 CLINICAL DATA:  74 year old with recent biopsy of hepatic mass on 04/23/2020. Abdominal pain with down trending hemoglobin level. EXAM: CT ABDOMEN AND PELVIS WITHOUT CONTRAST TECHNIQUE: Multidetector CT imaging of the abdomen and pelvis was performed following the standard protocol without IV contrast. COMPARISON:  CT abdomen and pelvis 04/20/2020 FINDINGS: Lower chest: New small bilateral pleural effusions. Hepatobiliary: Again noted is a poorly defined central right hepatic lesion that measures at least 7.4 cm. This lesion was recently biopsied. There is new perihepatic fluid along the right hepatic lobe. No acute abnormality to the liver. Pancreas: There is a mass or lymphadenopathy near the pancreatic head and similar to the recent comparison examination. Concern for nodularity near the pancreatic tail region which is similar to the recent comparison examination. Spleen: Poorly defined low-density lesion in the anterior inferior aspect of the spleen and concerning for a splenic lesion. Adrenals/Urinary Tract: Small nodule associated with the right adrenal gland. Mild fullness left adrenal gland. Probable cyst in left kidney upper pole. Negative for hydronephrosis. Fluid in the urinary bladder. Stomach/Bowel: Normal appearance of stomach. No evidence for bowel  obstruction focal bowel inflammation. Vascular/Lymphatic: Abdominal aorta measures up to 2.8 cm with atherosclerotic calcifications. Again noted is extensive para-aortic lymphadenopathy. Enlarged lymph nodes in the left iliac nodal chain. Again noted is a large mass or nodal mass in the central abdomen near the pancreatic head that measures up to 6.3 cm. Additional large mesenteric lymph nodes in the right abdomen on sequence 3, image 49. Probable enlarged lymph nodes in gastrohepatic ligament. Splenule or nodal lesion anterior to the spleen on image 21, sequence 3. Reproductive: Prostate is enlarged. Other: There is slightly dense ascites in the lower abdomen and pelvis. Findings are most compatible with hemoperitoneum from recent liver biopsy. Small amount of high-density fluid in the left lateral lower quadrant. Small amount of fluid around the liver. The most dense ascites measures roughly 26 Hounsfield units. Small amount of high-density fluid or blood extending into the right inguinal canal. Negative for free air. Musculoskeletal: Severe anterolisthesis at L4-L5. Multilevel degenerative changes in the lumbar spine. IMPRESSION: 1. Development of high-density ascites in the abdomen and pelvis. Findings are most compatible with a small to moderate amount of hemoperitoneum and related to the recent liver biopsy. 2. Extensive lymphadenopathy throughout the abdomen and pelvis with a large mass or nodal mass near the pancreatic head. There are additional lesions involving the liver and spleen. Findings are suggestive for metastatic disease. 3. Development of small bilateral pleural effusions. These results were called by telephone at the time of interpretation on 04/25/2020 at 1:43 pm to provider Lowry Bala [place] , who verbally acknowledged these results. Electronically Signed   By: Markus Daft M.D.   On: 04/25/2020 13:50        Scheduled Meds: . fluticasone  2 spray Each Nare Daily   Continuous Infusions: .  sodium chloride 500 mL/hr at 04/25/20 1409  . sodium chloride Stopped (04/26/20 0527)     LOS: 5 days    Time spent: 38 minutes spent on chart review, discussion with nursing staff, consultants, updating family and  interview/physical exam; more than 50% of that time was spent in counseling and/or coordination of care.    Heidi Lemay J [place] DO [facility] Available via Epic secure chat 7am-7pm After these hours, please refer to coverage provider listed on [email or web address] 04/26/2020, 11:57 AM

## 2020-04-26 NOTE — Progress Notes (Addendum)
John [name]   DOB:Dec 22, 1945   KT#:[id number]   DSK#:[id number]  Oncology follow up   Subjective: The patient reports that he is feeling well.  Wants to go home.  Reported that he does not have any abdominal pain and had a good bowel movement earlier today. 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   Objective:  Vitals:   04/26/20 0545 04/26/20 0900  BP: (!) 143/76 (!) 145/76  Pulse: 87 (!) 108  Resp: 20 18  Temp: 99.4 F (37.4 C) 98.8 F (37.1 C)  SpO2: 97% 99%    Body mass index is 31.06 kg/m.  Intake/Output Summary (Last 24 hours) at 04/26/2020 1332 Last data filed at 04/26/2020 0923 Gross per 24 hour  Intake 1613.01 ml  Output 1925 ml  Net -311.99 ml     Sclerae unicteric  Oropharynx clear  No peripheral adenopathy  Lungs clear -- no rales or rhonchi  Heart regular rate and rhythm  Abdomen soft, mild tenderness at RUQ  MSK no focal spinal tenderness, no peripheral edema  Neuro nonfocal    CBG (last 3)  No results for input(s): GLUCAP in the last 72 hours.   Labs:  Urine Studies No results for input(s): UHGB, CRYS in the last 72 hours.  Invalid input(s): UACOL, UAPR, USPG, UPH, UTP, UGL, UKET, UBIL, UNIT, UROB, ULEU, UEPI, UWBC, URBC, UBAC, CAST, [place], [place]  Basic Metabolic Panel: Recent Labs  Lab 04/20/20 1818 04/20/20 1818 04/21/20 0059 04/21/20 0059 04/22/20 0210 04/22/20 0210 04/23/20 0232 04/23/20 0232 04/24/20 0329 04/24/20 0329 04/25/20 0027 04/26/20 0146  NA 130*   < > 136   < > 136  --  138  --  138  --  132* 139  K 4.1   < > 3.6   < > 3.4*   < > 3.9   < > 3.8   < > 3.7 3.7  CL 91*   < > 101   < > 104  --  108  --  109  --  104 111  CO2 28   < > 27   < > 24  --  21*  --  21*  --  20* 21*  GLUCOSE 91   < > 89   < > 86  --  79  --  87  --  101* 89  BUN 43*   < > 41*   < > 41*  --  42*  --  47*  --  46* 40*  CREATININE 2.67*   < > 2.61*   < > 2.74*  --  2.75*  --  2.98*  --  2.76* 2.36*  CALCIUM >15.0*   < >  >15.0*   < > 13.1*  --  12.1*  --  11.3*  --  10.3 10.3  MG 2.0  --   --   --   --   --  1.6*  --  1.9  --   --   --   PHOS  --   --  4.0  --   --   --   --   --   --   --   --   --    < > = values in this interval not displayed.   GFR Estimated Creatinine Clearance: 34.3 mL/min (A) (by C-G formula based on SCr of 2.36 mg/dL (H)). Liver Function Tests: Recent Labs  Lab 04/22/20 0210 04/23/20 0232 04/24/20 0329 04/25/20 0027 04/26/20 0146  AST 66*  72* 106* 82* 65*  ALT 43 51* 64* 51* 47*  ALKPHOS 95 127* 99 88 89  BILITOT 1.4* 1.4* 1.1 1.1 1.3*  PROT 4.5* 4.5* 4.2* 4.3* 4.4*  ALBUMIN 2.6* 2.5* 2.4* 2.4* 2.4*   No results for input(s): LIPASE, AMYLASE in the last 168 hours. No results for input(s): AMMONIA in the last 168 hours. Coagulation profile Recent Labs  Lab 04/22/20 0210  INR 1.3*    CBC: Recent Labs  Lab 04/20/20 1818 04/20/20 1818 04/21/20 0059 04/21/20 0059 04/22/20 0210 04/22/20 0210 04/23/20 1801 04/23/20 2154 04/24/20 0329 04/24/20 0329 04/24/20 1209 04/25/20 0027 04/25/20 1519 04/25/20 2127 04/26/20 0146  WBC 5.8   < > 4.3   < > 4.0  --  6.5  --  5.0  --   --  4.0  --   --  4.0  NEUTROABS 4.4  --  3.2  --   --   --   --   --   --   --   --   --   --   --   --   HGB 12.2*   < > 10.9*   < > 10.5*   < > 9.5*   < > 8.3*   < > 8.3* 7.2* 8.3* 8.1* 8.1*  HCT 36.3*   < > 31.6*   < > 31.8*   < > 29.2*   < > 24.8*   < > 24.2* 21.3* 24.7* 24.1* 23.9*  MCV 87.3   < > 86.6   < > 86.4  --  89.6  --  86.7  --   --  85.2  --   --  86.9  PLT 150   < > 126*   < > 112*  --  134*  --  129*  --   --  116*  --   --  122*   < > = values in this interval not displayed.   Cardiac Enzymes: No results for input(s): CKTOTAL, CKMB, CKMBINDEX, TROPONINI in the last 168 hours. BNP: Invalid input(s): POCBNP CBG: Recent Labs  Lab 04/20/20 1857  GLUCAP 92   D-Dimer No results for input(s): DDIMER in the last 72 hours. Hgb A1c No results for input(s): HGBA1C in the  last 72 hours. Lipid Profile No results for input(s): CHOL, HDL, LDLCALC, TRIG, CHOLHDL, LDLDIRECT in the last 72 hours. Thyroid function studies No results for input(s): TSH, T4TOTAL, T3FREE, THYROIDAB in the last 72 hours.  Invalid input(s): FREET3 Anemia work up No results for input(s): VITAMINB12, FOLATE, FERRITIN, TIBC, IRON, RETICCTPCT in the last 72 hours. Microbiology Recent Results (from the past 240 hour(s))  Respiratory Panel by RT PCR (Flu A&B, Covid) - Nasopharyngeal Swab     Status: None   Collection Time: 04/20/20  6:25 PM   Specimen: Nasopharyngeal Swab  Result Value Ref Range Status   SARS Coronavirus 2 by RT PCR NEGATIVE NEGATIVE Final    Comment: (NOTE) SARS-CoV-2 target nucleic acids are NOT DETECTED.  The SARS-CoV-2 RNA is generally detectable in upper respiratoy specimens during the acute phase of infection. The lowest concentration of SARS-CoV-2 viral copies this assay can detect is 131 copies/mL. A negative result does not preclude SARS-Cov-2 infection and should not be used as the sole basis for treatment or other patient management decisions. A negative result may occur with  improper specimen collection/handling, submission of specimen other than nasopharyngeal swab, presence of viral mutation(s) within the areas targeted by this assay, and inadequate number  of viral copies (<131 copies/mL). A negative result must be combined with clinical observations, patient history, and epidemiological information. The expected result is Negative.  Fact Sheet for Patients:  PinkCheek.be  Fact Sheet for Healthcare Providers:  GravelBags.it  This test is no t yet approved or cleared by the [place] FDA and  has been authorized for detection and/or diagnosis of SARS-CoV-2 by FDA under an Emergency Use Authorization (EUA). This EUA will remain  in effect (meaning this test can be used) for the duration of  the COVID-19 declaration under Section 564(b)(1) of the Act, 21 U.S.C. section 360bbb-3(b)(1), unless the authorization is terminated or revoked sooner.     Influenza A by PCR NEGATIVE NEGATIVE Final   Influenza B by PCR NEGATIVE NEGATIVE Final    Comment: (NOTE) The Xpert Xpress SARS-CoV-2/FLU/RSV assay is intended as an aid in  the diagnosis of influenza from Nasopharyngeal swab specimens and  should not be used as a sole basis for treatment. Nasal washings and  aspirates are unacceptable for Xpert Xpress SARS-CoV-2/FLU/RSV  testing.  Fact Sheet for Patients: PinkCheek.be  Fact Sheet for Healthcare Providers: GravelBags.it  This test is not yet approved or cleared by the [place] FDA and  has been authorized for detection and/or diagnosis of SARS-CoV-2 by  FDA under an Emergency Use Authorization (EUA). This EUA will remain  in effect (meaning this test can be used) for the duration of the  Covid-19 declaration under Section 564(b)(1) of the Act, 21  U.S.C. section 360bbb-3(b)(1), unless the authorization is  terminated or revoked. Performed at [facility], [place] [address], [place], [place] [address]       Studies:  CT ABDOMEN PELVIS WO CONTRAST  Result Date: 04/25/2020 CLINICAL DATA:  74 year old with recent biopsy of hepatic mass on 04/23/2020. Abdominal pain with down trending hemoglobin level. EXAM: CT ABDOMEN AND PELVIS WITHOUT CONTRAST TECHNIQUE: Multidetector CT imaging of the abdomen and pelvis was performed following the standard protocol without IV contrast. COMPARISON:  CT abdomen and pelvis 04/20/2020 FINDINGS: Lower chest: New small bilateral pleural effusions. Hepatobiliary: Again noted is a poorly defined central right hepatic lesion that measures at least 7.4 cm. This lesion was recently biopsied. There is new perihepatic fluid along the right hepatic lobe. No acute abnormality to the liver.  Pancreas: There is a mass or lymphadenopathy near the pancreatic head and similar to the recent comparison examination. Concern for nodularity near the pancreatic tail region which is similar to the recent comparison examination. Spleen: Poorly defined low-density lesion in the anterior inferior aspect of the spleen and concerning for a splenic lesion. Adrenals/Urinary Tract: Small nodule associated with the right adrenal gland. Mild fullness left adrenal gland. Probable cyst in left kidney upper pole. Negative for hydronephrosis. Fluid in the urinary bladder. Stomach/Bowel: Normal appearance of stomach. No evidence for bowel obstruction focal bowel inflammation. Vascular/Lymphatic: Abdominal aorta measures up to 2.8 cm with atherosclerotic calcifications. Again noted is extensive para-aortic lymphadenopathy. Enlarged lymph nodes in the left iliac nodal chain. Again noted is a large mass or nodal mass in the central abdomen near the pancreatic head that measures up to 6.3 cm. Additional large mesenteric lymph nodes in the right abdomen on sequence 3, image 49. Probable enlarged lymph nodes in gastrohepatic ligament. Splenule or nodal lesion anterior to the spleen on image 21, sequence 3. Reproductive: Prostate is enlarged. Other: There is slightly dense ascites in the lower abdomen and pelvis. Findings are most compatible with hemoperitoneum from recent  liver biopsy. Small amount of high-density fluid in the left lateral lower quadrant. Small amount of fluid around the liver. The most dense ascites measures roughly 26 Hounsfield units. Small amount of high-density fluid or blood extending into the right inguinal canal. Negative for free air. Musculoskeletal: Severe anterolisthesis at L4-L5. Multilevel degenerative changes in the lumbar spine. IMPRESSION: 1. Development of high-density ascites in the abdomen and pelvis. Findings are most compatible with a small to moderate amount of hemoperitoneum and related to the  recent liver biopsy. 2. Extensive lymphadenopathy throughout the abdomen and pelvis with a large mass or nodal mass near the pancreatic head. There are additional lesions involving the liver and spleen. Findings are suggestive for metastatic disease. 3. Development of small bilateral pleural effusions. These results were called by telephone at the time of interpretation on 04/25/2020 at 1:43 pm to provider ERIC [place] , who verbally acknowledged these results. Electronically Signed   By: Markus Daft M.D.   On: 04/25/2020 13:50    Assessment: 74 y.o. Caucasian male with past medical history of hypertension and BPH presented with severe hypocalcemia, large mediastinal and liver mass, and extensive retroperitoneal adenopathy.  1.  Severe hypercalcemia, likely malignancy related, resolved  2.  Large mediastinal and liver mass, mesenteric and retroperitoneal lymphadenopathy, concerning for malignancy, s/p liver biopsy 11/15 3. Tumor lysis syndrome  4. HTN 4. hemoperitoneum second to liver biopsy, s/p 1u blood today  5. AKI    Recommendations: -The preliminary biopsy result consistent with non-Hodgkin's lymphoma.  Discussed with the patient and his wife.  We discussed treatment which would consist of chemotherapy.  The patient really wants to go home today and we can arrange for outpatient follow-up to begin chemotherapy early next week.  We will arrange for Port-A-Cath to be placed prior to chemotherapy as an outpatient. -The patient will go home on prednisone 60 mg daily for 5 days. -Uric acid level has normalized.  He will continue allopurinol as an outpatient. -The patient will be contacted with the date and time of his outpatient follow-up at the cancer center.   Mikey Bussing, NP 04/26/2020    Addendum  I have seen the patient, examined him. I agree with the assessment and and plan and have edited the notes.   I discussed his liver biopsy with pathologists.  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  More immunostain are pending to further categorized the definitive diagnosis.  Lab reviewed, his uric acid is back to normal, kidney function improved.  Patient is eager to go home.  I reviewed the overall prognosis of high-grade lymphoma and treatment options, which is chemotherapy, such as CHOP.  Patient is interested in treatment, and would like to go home before he starts treatment.  I will arrange office visits with me next Monday, and potentially start chemo a few days after next visit.    Okay to discharge patient home today, I recommend him to start prednisone 60 mg daily for 5 days, which is part of the chemo CHOP regimen.  To prevent steroids related gastritis and ulcer, please also call in Protonix.  I also recommend allopurinol daily for tumor lysis prevention.  I encouraged him to drink fluids adequately.  Patient and his wife voiced good understanding, and agrees with the plan.  I will arrange echocardiogram, and a possible PICC line placement for chemo as outpatient early next week.  Truitt Merle  04/26/2020

## 2020-04-26 NOTE — Progress Notes (Signed)
Nsg Discharge Note  Admit Date:  04/20/2020 Discharge date: 04/26/2020   John [name] to be D/C'd  per MD order.  Patient/caregiver able to verbalize understanding.  Discharge Medication: Allergies as of 04/26/2020   No Known Allergies     Medication List    STOP taking these medications   benazepril 10 MG tablet Commonly known as: LOTENSIN     TAKE these medications   allopurinol 100 MG tablet Commonly known as: Zyloprim Take 1 tablet (100 mg total) by mouth daily.   aspirin EC 81 MG tablet Take 81 mg by mouth daily.   CALCIUM CITRATE + D PO Take 1 tablet by mouth daily.   clobetasol ointment 0.05 % Commonly known as: TEMOVATE Apply 1 application topically 2 (two) times daily.   docusate sodium 100 MG capsule Commonly known as: COLACE Take 100 mg by mouth 2 (two) times daily as needed for mild constipation.   doxazosin 4 MG tablet Commonly known as: CARDURA Take 4 mg by mouth daily.   metoprolol tartrate 25 MG tablet Commonly known as: LOPRESSOR Take 0.5 tablets (12.5 mg total) by mouth 2 (two) times daily.   MULTIVITAMIN PO Take 1 tablet by mouth daily.   OMEGA 3 PO Take 1 tablet by mouth daily.   pantoprazole 40 MG tablet Commonly known as: Protonix Take 1 tablet (40 mg total) by mouth daily.   polyethylene glycol powder 17 GM/SCOOP powder Commonly known as: GLYCOLAX/MIRALAX Take 17 g by mouth daily.   predniSONE 20 MG tablet Commonly known as: DELTASONE Take 3 tablets (60 mg total) by mouth daily for 5 days. Start taking on: April 27, 2020   PROSTATE PO Take 2 tablets by mouth daily.   triamcinolone 0.1 % Commonly known as: KENALOG Apply 1 application topically 3 (three) times daily as needed for rash.   vitamin C with rose hips 1000 MG tablet Take 1,000 mg by mouth daily.            Durable Medical Equipment  (From admission, onward)         Start     Ordered   04/26/20 1412  For home use only DME 3 n 1  Once         04/26/20 1411   04/26/20 1411  For home use only DME Walker rolling  Once       Question Answer Comment  Walker: With 5 Inch Wheels   Patient needs a walker to treat with the following condition Gait abnormality      04/26/20 1411   04/26/20 1411  For home use only DME Tub bench  Once        04/26/20 1411          Discharge Assessment: Vitals:   04/26/20 0545 04/26/20 0900  BP: (!) 143/76 (!) 145/76  Pulse: 87 (!) 108  Resp: 20 18  Temp: 99.4 F (37.4 C) 98.8 F (37.1 C)  SpO2: 97% 99%   Skin clean, dry and intact without evidence of skin break down, no evidence of skin tears noted. IV catheter discontinued intact. Site without signs and symptoms of complications - no redness or edema noted at insertion site, patient denies c/o pain - only slight tenderness at site.  Dressing with slight pressure applied.  D/c Instructions-Education: Discharge instructions given to patient/family with verbalized understanding. D/c education completed with patient/family including follow up instructions, medication list, d/c activities limitations if indicated, with other d/c instructions as indicated by MD - patient  able to verbalize understanding, all questions fully answered. Patient instructed to return to ED, call 911, or call MD for any changes in condition.  Patient escorted via [place], and D/C home via private auto.  Honor Loh , RN 04/26/2020 5:17 PM

## 2020-04-26 NOTE — TOC Initial Note (Signed)
Transition of Care [facility]) - Initial/Assessment Note    Patient Details  Name: John [name] MRN: [id number] Date of Birth: 1946-01-21  [facility] CM/SW Contact:    Bartholomew Crews, RN Phone Number: [phone] 04/26/2020, 4:10 PM  Clinical Narrative:                  Spoke with patient and spouse at the bedside. Discussed plans for transition home. DME referral sent to [employer] for delivery to the room. Discussed HH PT recommendation and offered agency choice. Referral accepted by [facility] No further TOC needs identified.   Expected Discharge Plan: [place] Barriers to Discharge: No Barriers Identified   Patient Goals and CMS Choice Patient states their goals for this hospitalization and ongoing recovery are:: return home CMS Medicare.gov Compare Post Acute Care list provided to:: Patient Choice offered to / list presented to : Patient, Spouse  Expected Discharge Plan and Services Expected Discharge Plan: [place]   Discharge Planning Services: CM Consult Post Acute Care Choice: Home Health, Durable Medical Equipment Living arrangements for the past 2 months: [place] Expected Discharge Date: 04/26/20               DME Arranged: 3-N-1, Tub bench, Walker rolling DME Agency: [employer] Date DME Agency Contacted: 04/26/20 Time DME Agency Contacted: [phone] Representative spoke with at DME Agency: Freda Munro HH Arranged: PT [place]: [place] Date [place]: 04/26/20 Time [place]: [place] Representative spoke with at [place]: [place] Arrangements/Services Living arrangements for the past 2 months: [place] with:: Self, Spouse Patient language and need for interpreter reviewed:: Yes Do you feel safe going back to the place where you live?: Yes      Need for Family Participation in Patient Care: Yes (Comment) Care giver support system in  place?: Yes (comment)   Criminal Activity/Legal Involvement Pertinent to Current Situation/Hospitalization: No - Comment as needed  Activities of Daily Living Home Assistive Devices/Equipment: None ADL Screening (condition at time of admission) Patient's cognitive ability adequate to safely complete daily activities?: Yes Is the patient deaf or have difficulty hearing?: No Does the patient have difficulty seeing, even when wearing glasses/contacts?: No Does the patient have difficulty concentrating, remembering, or making decisions?: No Patient able to express need for assistance with ADLs?: No Does the patient have difficulty dressing or bathing?: No Independently performs ADLs?: Yes (appropriate for developmental age) Does the patient have difficulty walking or climbing stairs?: No Weakness of Legs: None Weakness of Arms/Hands: None  Permission Sought/Granted      Share Information with NAME: Haik Mahoney     Permission granted to share info w Relationship: wife  Permission granted to share info w Contact Information: [phone]  Emotional Assessment Appearance:: Appears stated age Attitude/Demeanor/Rapport: Engaged Affect (typically observed): Accepting Orientation: : Oriented to Self, Oriented to  Time, Oriented to Place, Oriented to Situation Alcohol / Substance Use: Not Applicable Psych Involvement: No (comment)  Admission diagnosis:  Hypercalcemia [E83.52] 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 Tumor lysis syndrome 04/24/2020  . ARF (acute renal failure) ([place]) 04/21/2020  . 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 Liver mass 04/21/2020  . Hypercalcemia 04/20/2020  . Essential hypertension 06/14/2018   PCP:  Christain [place], MD Pharmacy:   [facility] [place], [place] [address] Phone: [phone] Fax: [phone]  [facility] [place], [place] [address] AT [place] [address] [place] [address] [place] [address] Phone: [phone] Fax: [phone]  [facility] [place], [place] - [address] [place] [address] Phone: [phone] Fax: [phone]     Social Determinants of Health (SDOH) Interventions    Readmission Risk Interventions No flowsheet data found.

## 2020-04-26 NOTE — Plan of Care (Signed)

## 2020-04-26 NOTE — Discharge Summary (Signed)
Physician Discharge Summary  John [name] TMA:[id number] DOB: [birth year] DOA: 04/20/2020  PCP: Christain [place], MD  Admit date: 04/20/2020 Discharge date: 04/26/2020  Admitted From: Home Disposition: Home  Recommendations for Outpatient Follow-up:  1. Follow up with PCP in 1-2 weeks 2. Follow-up with medical oncology, Dr. Burr Medico scheduled on 04/30/2020 3. Start prednisone 60 mg p.o. daily x5 days on 04/27/2020 4. Started on allopurinol 100 mg p.o. daily for hyperuricemia 5. Started on Protonix 40 mg p.o. daily for GI prophylaxis on prednisone 6. Discontinued benazepril secondary to renal failure 7. Please obtain BMP/CBC/uric acid/calcium level at next visit 8. Please follow up on the following pending results: Surgical pathology from liver biopsy which was still pending at time of discharge 9. 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  Was seen by his PCP and given laxatives; which assisted in resolution of his constipation.  Given persistent weakness, he proceeded back to his PCP with lab testing performed that showed critically high calcium levels and was then instructed to come to the emergency department.  Patient did not take any calcium supplementation at home.  His wife also reports that he is been slightly confused over this timeframe as well.  Denies fever/chills/night sweats, no nausea vomiting, shortness of breath or chest pain.  In the ED, creatinine 2.6 which is elevated from January 2020.  Calcium greater than 15,  hemoglobin 12.6, platelets 147.  CT chest/abdomen/pelvis without contrast notable for anterior mediastinal mass and right lobe hypodensity.  Patient was started on 2 L NS bolus.  Hospitalist service consulted for admission for further evaluation and treatment.  Hospital course:  Anterior mediastinal mass Right hepatic hypodensity Patient presenting with progressive weakness and constipation over the past 2-3 weeks.  Reports 10 pound weight loss over the past month.  Was noted to have severely elevated calcium level at PCP visit.  CT chest/abdomen notable for large anterior mediastinal mass with right liver lobe hypodensity and extensive mesenteric and retroperitoneal adenopathy.  LDH elevated 416. AFP 2.0 (normal), CEA 1.9 (normal), CA 19-9 <2.  Patient underwent ultrasound-guided liver biopsy by IR on 04/23/2020.  Pathology pending at time of discharge.  Patient will start prednisone 60 mg p.o. daily x5 days on 04/27/2020 and has follow-up scheduled with Dr. Burr Medico, medical oncology on 04/30/2020.  Tumor lysis syndrome Uric acid elevated at 11.9.  Received IV rasburicase on 04/24/2020 with resolution of uricemia with uric acid 6.1 at time of discharge.  We will continue allopurinol 100 mg p.o. daily.  Recommend repeat labs with uric acid and renal function at next visit.  Post procedure hypotension and acute blood loss anemia Following ultrasound-guided liver biopsy on 04/23/2020, on return to room, patient was hypotensive with BP 84/38.  Patient was asymptomatic besides some localized discomfort surrounding the biopsy site.  Patient was given fentanyl and Versed during procedure.  Discussed with interventional radiology, Dr. Serafina Royals, and there was some bleeding during the procedure as a liver mass was very vascular, in which he utilized Gelfoam slurry needle track embolization.  Repeat CT abdomen/pelvis without contrast on  04/25/2020 with findings consistent with small-moderate hemoperitoneum related to  recent liver biopsy; discussed findings with radiology, Dr. Anselm Pancoast.  Transfused 1 unit PRBC on 04/25/2020.  Hemoglobin responded well, repeat this morning 9.2 at time of discharge.  Recommend repeat CBC at next visit.  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  Not on calcium or vitamin D supplements.  Vitamin D 25 hydroxy 21.57 (low). Received 2L NS bolus followed by pamidronate 90 mg IV on 04/21/2020 and further aggressive IV fluid hydration.  Calcium level 10.3 at time of discharge.  Repeat calcium level in next visit.  Acute renal failure Creatinine 2.67 on admission, baseline 0.78 06/2018.  Ultrasound with 2.4 cm benign-appearing left renal cyst, otherwise normal appearance; no mass or hydronephrosis. FeNa 3.0%, consistent with intrinsic etiology; likely from underlying tumor lysis syndrome.  Treatment of hypercalcemia and hyperuricemia as above.  Creatinine improved to 2.36 at time of discharge.  Discontinued home benazepril.  Repeat renal function next visit.  May need nephrology outpatient referral.  Anemia Thrombocytopenia Hemoglobin 10.9, platelets 126 on admission.  Suspect etiology from malignancy as above.  Iron 59, TIBC 286, ferritin 1266, folate 27.9, B12 497.  Peripheral smear review by pathology with normocytic anemia with thrombocytopenia.SPEP negative.  Infuse 1 unit PRBC on 04/25/2020.  Hemoglobin 9.2 at time of discharge.  CBC next visit.  Essential hypertension Discontinued home benazepril as above for acute renal failure.  May resume metoprolol tartrate 12.5 mg p.o. twice daily.  Outpatient follow-up with PCP.  BPH:  Continue doxazosin 4 mg p.o. daily  OSA:  Continue nocturnal CPAP  Weakness/debility, deconditioning: PT and OT followed during hospital course.  DME ordered for walker, 3 n 1 bedside commode, tub bench.  Discharge Diagnoses:  Principal Problem:   Hypercalcemia Active Problems:   Essential  hypertension   ARF (acute renal failure) (HCC)   Mediastinal mass   Liver mass   Tumor lysis syndrome    Discharge Instructions  Discharge Instructions    Call MD for:  difficulty breathing, headache or visual disturbances   Complete by: As directed    Call MD for:  extreme fatigue   Complete by: As directed    Call MD for:  persistant dizziness or light-headedness   Complete by: As directed    Call MD for:  persistant nausea and vomiting   Complete by: As directed    Call MD for:  redness, tenderness, or signs of infection (pain, swelling, redness, odor or green/yellow discharge around incision site)   Complete by: As directed    Call MD for:  severe uncontrolled pain   Complete by: As directed    Call MD for:  temperature >100.4   Complete by: As directed    Diet - low sodium heart healthy   Complete by: As directed    Increase activity slowly   Complete by: As directed    No wound care   Complete by: As directed      Allergies as of 04/26/2020   No Known Allergies     Medication List    STOP taking these medications   benazepril 10 MG tablet Commonly known as: LOTENSIN     TAKE these medications   allopurinol 100 MG tablet Commonly known as: Zyloprim Take 1 tablet (100 mg total) by mouth daily.   aspirin EC 81 MG tablet Take 81 mg by mouth daily.   CALCIUM CITRATE + D PO Take 1 tablet by mouth daily.   clobetasol  ointment 0.05 % Commonly known as: TEMOVATE Apply 1 application topically 2 (two) times daily.   docusate sodium 100 MG capsule Commonly known as: COLACE Take 100 mg by mouth 2 (two) times daily as needed for mild constipation.   doxazosin 4 MG tablet Commonly known as: CARDURA Take 4 mg by mouth daily.   metoprolol tartrate 25 MG tablet Commonly known as: LOPRESSOR Take 0.5 tablets (12.5 mg total) by mouth 2 (two) times daily.   MULTIVITAMIN PO Take 1 tablet by mouth daily.   OMEGA 3 PO Take 1 tablet by mouth daily.    pantoprazole 40 MG tablet Commonly known as: Protonix Take 1 tablet (40 mg total) by mouth daily.   polyethylene glycol powder 17 GM/SCOOP powder Commonly known as: GLYCOLAX/MIRALAX Take 17 g by mouth daily.   predniSONE 20 MG tablet Commonly known as: DELTASONE Take 3 tablets (60 mg total) by mouth daily for 5 days. Start taking on: April 27, 2020   PROSTATE PO Take 2 tablets by mouth daily.   triamcinolone 0.1 % Commonly known as: KENALOG Apply 1 application topically 3 (three) times daily as needed for rash.   vitamin C with rose hips 1000 MG tablet Take 1,000 mg by mouth daily.            Durable Medical Equipment  (From admission, onward)         Start     Ordered   04/26/20 1412  For home use only DME 3 n 1  Once        04/26/20 1411   04/26/20 1411  For home use only DME Walker rolling  Once       Question Answer Comment  Walker: With [place] Wheels   Patient needs a walker to treat with the following condition Gait abnormality      04/26/20 1411   04/26/20 1411  For home use only DME Tub bench  Once        04/26/20 1411          No Known Allergies  Consultations:  Medical oncology, Dr. Burr Medico  Interventional radiology   Procedures/Studies: CT ABDOMEN PELVIS WO CONTRAST  Result Date: 04/25/2020 CLINICAL DATA:  74 year old with recent biopsy of hepatic mass on 04/23/2020. Abdominal pain with down trending hemoglobin level. EXAM: CT ABDOMEN AND PELVIS WITHOUT CONTRAST TECHNIQUE: Multidetector CT imaging of the abdomen and pelvis was performed following the standard protocol without IV contrast. COMPARISON:  CT abdomen and pelvis 04/20/2020 FINDINGS: Lower chest: New small bilateral pleural effusions. Hepatobiliary: Again noted is a poorly defined central right hepatic lesion that measures at least 7.4 cm. This lesion was recently biopsied. There is new perihepatic fluid along the right hepatic lobe. No acute abnormality to the liver. Pancreas:  There is a mass or lymphadenopathy near the pancreatic head and similar to the recent comparison examination. Concern for nodularity near the pancreatic tail region which is similar to the recent comparison examination. Spleen: Poorly defined low-density lesion in the anterior inferior aspect of the spleen and concerning for a splenic lesion. Adrenals/Urinary Tract: Small nodule associated with the right adrenal gland. Mild fullness left adrenal gland. Probable cyst in left kidney upper pole. Negative for hydronephrosis. Fluid in the urinary bladder. Stomach/Bowel: Normal appearance of stomach. No evidence for bowel obstruction focal bowel inflammation. Vascular/Lymphatic: Abdominal aorta measures up to 2.8 cm with atherosclerotic calcifications. Again noted is extensive para-aortic lymphadenopathy. Enlarged lymph nodes in the left iliac nodal chain. Again noted is a  large mass or nodal mass in the central abdomen near the pancreatic head that measures up to 6.3 cm. Additional large mesenteric lymph nodes in the right abdomen on sequence 3, image 49. Probable enlarged lymph nodes in gastrohepatic ligament. Splenule or nodal lesion anterior to the spleen on image 21, sequence 3. Reproductive: Prostate is enlarged. Other: There is slightly dense ascites in the lower abdomen and pelvis. Findings are most compatible with hemoperitoneum from recent liver biopsy. Small amount of high-density fluid in the left lateral lower quadrant. Small amount of fluid around the liver. The most dense ascites measures roughly 26 Hounsfield units. Small amount of high-density fluid or blood extending into the right inguinal canal. Negative for free air. Musculoskeletal: Severe anterolisthesis at L4-L5. Multilevel degenerative changes in the lumbar spine. IMPRESSION: 1. Development of high-density ascites in the abdomen and pelvis. Findings are most compatible with a small to moderate amount of hemoperitoneum and related to the recent  liver biopsy. 2. Extensive lymphadenopathy throughout the abdomen and pelvis with a large mass or nodal mass near the pancreatic head. There are additional lesions involving the liver and spleen. Findings are suggestive for metastatic disease. 3. Development of small bilateral pleural effusions. These results were called by telephone at the time of interpretation on 04/25/2020 at 1:43 pm to provider Verbie Babic [place] , who verbally acknowledged these results. Electronically Signed   By: Markus Daft M.D.   On: 04/25/2020 13:50   CT ABDOMEN PELVIS WO CONTRAST  Result Date: 04/20/2020 CLINICAL DATA:  74 year old male with nausea vomiting. Epigastric pain. EXAM: CT CHEST, ABDOMEN AND PELVIS WITHOUT CONTRAST TECHNIQUE: Multidetector CT imaging of the chest, abdomen and pelvis was performed following the standard protocol without IV contrast. COMPARISON:  None. FINDINGS: Evaluation of this exam is limited in the absence of intravenous contrast. CT CHEST FINDINGS Cardiovascular: There is no cardiomegaly or pericardial effusion. Coronary vascular calcification. There is moderate atherosclerotic calcification of the thoracic aorta. The central pulmonary arteries are grossly unremarkable on this noncontrast CT. Mediastinum/Nodes: Large anterior mediastinal/prevascular space mass/adenopathy abutting the anterior aspect of the aortic arch measuring 6.3 x 4.0 cm in greatest axial dimensions. There is superior extension of the mass into the upper mediastinum. Additional 2.7 x 2.2 cm nodular density in the region of the left hilum may represent portion of the mass or new adenopathy. The esophagus is grossly unremarkable. No mediastinal fluid collection. Lungs/Pleura: The lungs are clear. There is no pleural effusion pneumothorax. The central airways are patent. Musculoskeletal: Degenerative changes of the spine. No acute osseous pathology. CT ABDOMEN PELVIS FINDINGS No intra-abdominal free air or free fluid. Hepatobiliary: There is  an 8.6 x 8.2 cm low attenuating mass in the right lobe of the liver which is not characterized on this CT. Further characterization with MRI without and with contrast on a nonemergent/outpatient basis recommended. No intrahepatic biliary ductal dilatation. Probable sludge within the gallbladder. No calcified stone or pericholecystic fluid. Pancreas: No acute inflammation. No pancreatic atrophy or dilatation of the main pancreatic duct Spleen: Indeterminate and ill-defined hypodense lesions within the spleen. There is slight irregularity of the splenic contour. Adrenals/Urinary Tract: Mild thickening of the adrenal glands. There is no hydronephrosis or nephrolithiasis on either side. A 15 mm left renal upper pole hypodense lesion, likely a cyst. The visualized ureters and urinary bladder appear unremarkable. Stomach/Bowel: There is sigmoid diverticulosis without active inflammatory changes. There is no bowel obstruction or active inflammation. The appendix is normal. Vascular/Lymphatic: Advanced aortoiliac atherosclerotic disease. The IVC is  unremarkable. No portal venous gas. Extensive mesenteric and retroperitoneal adenopathy. There is a 6.3 x 6.9 cm soft tissue mass in the mesentery abutting the inferior aspect of the uncinate process of the pancreas. Additional soft tissue mass/adenopathy medial to the ascending colon. Reproductive: Mild prostate enlargement. Other: None Musculoskeletal: Degenerative changes of the spine. 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 No acute osseous pathology. IMPRESSION: 1. Large anterior mediastinal/prevascular space mass/adenopathy. The mass extends superiorly and abuts the upper trachea. 2. Large hypodense lesion in the right lobe of the liver as well as ill-defined splenic hypodense lesions. Further characterization with MRI without and with contrast recommended. The liver lesion is amenable to percutaneous tissue sampling. 3. Extensive mesenteric and  retroperitoneal adenopathy. 4. Sigmoid diverticulosis. No bowel obstruction. Normal appendix. 5. Aortic Atherosclerosis (ICD10-I70.0). Electronically Signed   By: Anner Crete M.D.   On: 04/20/2020 21:11   CT Chest Wo Contrast  Result Date: 04/20/2020 CLINICAL DATA:  74 year old male with nausea vomiting. Epigastric pain. EXAM: CT CHEST, ABDOMEN AND PELVIS WITHOUT CONTRAST TECHNIQUE: Multidetector CT imaging of the chest, abdomen and pelvis was performed following the standard protocol without IV contrast. COMPARISON:  None. FINDINGS: Evaluation of this exam is limited in the absence of intravenous contrast. CT CHEST FINDINGS Cardiovascular: There is no cardiomegaly or pericardial effusion. Coronary vascular calcification. There is moderate atherosclerotic calcification of the thoracic aorta. The central pulmonary arteries are grossly unremarkable on this noncontrast CT. Mediastinum/Nodes: Large anterior mediastinal/prevascular space mass/adenopathy abutting the anterior aspect of the aortic arch measuring 6.3 x 4.0 cm in greatest axial dimensions. There is superior extension of the mass into the upper mediastinum. Additional 2.7 x 2.2 cm nodular density in the region of the left hilum may represent portion of the mass or new adenopathy. The esophagus is grossly unremarkable. No mediastinal fluid collection. Lungs/Pleura: The lungs are clear. There is no pleural effusion pneumothorax. The central airways are patent. Musculoskeletal: Degenerative changes of the spine. No acute osseous pathology. CT ABDOMEN PELVIS FINDINGS No intra-abdominal free air or free fluid. Hepatobiliary: There is an 8.6 x 8.2 cm low attenuating mass in the right lobe of the liver which is not characterized on this CT. Further characterization with MRI without and with contrast on a nonemergent/outpatient basis recommended. No intrahepatic biliary ductal dilatation. Probable sludge within the gallbladder. No calcified stone or  pericholecystic fluid. Pancreas: No acute inflammation. No pancreatic atrophy or dilatation of the main pancreatic duct Spleen: Indeterminate and ill-defined hypodense lesions within the spleen. There is slight irregularity of the splenic contour. Adrenals/Urinary Tract: Mild thickening of the adrenal glands. There is no hydronephrosis or nephrolithiasis on either side. A 15 mm left renal upper pole hypodense lesion, likely a cyst. The visualized ureters and urinary bladder appear unremarkable. Stomach/Bowel: There is sigmoid diverticulosis without active inflammatory changes. There is no bowel obstruction or active inflammation. The appendix is normal. Vascular/Lymphatic: Advanced aortoiliac atherosclerotic disease. The IVC is unremarkable. No portal venous gas. Extensive mesenteric and retroperitoneal adenopathy. There is a 6.3 x 6.9 cm soft tissue mass in the mesentery abutting the inferior aspect of the uncinate process of the pancreas. Additional soft tissue mass/adenopathy medial to the ascending colon. Reproductive: Mild prostate enlargement. Other: None Musculoskeletal: Degenerative changes of the spine. 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 No acute osseous pathology. IMPRESSION: 1. Large anterior mediastinal/prevascular space mass/adenopathy. The mass extends superiorly and abuts the upper trachea. 2. Large hypodense lesion in the right lobe of the liver  as well as ill-defined splenic hypodense lesions. Further characterization with MRI without and with contrast recommended. The liver lesion is amenable to percutaneous tissue sampling. 3. Extensive mesenteric and retroperitoneal adenopathy. 4. Sigmoid diverticulosis. No bowel obstruction. Normal appendix. 5. Aortic Atherosclerosis (ICD10-I70.0). Electronically Signed   By: Anner Crete M.D.   On: 04/20/2020 21:11   US RENAL  Result Date: 04/22/2020 CLINICAL DATA:  Acute renal failure. EXAM: RENAL / URINARY TRACT ULTRASOUND  COMPLETE COMPARISON:  CT 04/20/2020 FINDINGS: Right Kidney: Renal measurements: 12.7 x 5.4 x 5.5 cm = volume: 195 mL. Echogenicity within normal limits. No mass or hydronephrosis visualized. Left Kidney: Renal measurements: 12.9 x 5.9 x 5.0 cm = volume: 198 mL. Echogenicity within normal limits. No hydronephrosis visualized. Benign-appearing cyst measures 2.4 cm in the upper pole of the left kidney. Bladder: Appears normal for degree of bladder distention. Prevoid volume is 71 cc. Other: None. IMPRESSION: 1. 2.4 cm benign-appearing left renal cyst. 2. Otherwise normal appearance of the kidneys and bladder. Electronically Signed   By: Fidela Salisbury M.D.   On: 04/22/2020 10:30   US BIOPSY (LIVER)  Result Date: 04/23/2020 INDICATION: 74 year old male with indeterminate right lobe up attic mass. EXAM: ULTRASOUND GUIDED LIVER LESION BIOPSY COMPARISON:  None. MEDICATIONS: None ANESTHESIA/SEDATION: Fentanyl 75 mcg IV; Versed 1.5 mg IV Total Moderate Sedation time:  13 minutes. The patient's level of consciousness and vital signs were monitored continuously by radiology nursing throughout the procedure under my direct supervision. COMPLICATIONS: None immediate. PROCEDURE: Informed written consent was obtained from the patient after a discussion of the risks, benefits and alternatives to treatment. The patient understands and consents the procedure. A timeout was performed prior to the initiation of the procedure. Ultrasound scanning was performed of the right upper abdominal quadrant demonstrates heterogeneously hypoechoic well-defined mass in the right lobe measuring up to approximately 9 cm. The right lobe mass was selected for biopsy and the procedure was planned. The right upper abdominal quadrant was prepped and draped in the usual sterile fashion. The overlying soft tissues were anesthetized with 1% lidocaine with epinephrine. A 17 gauge, 6.8 cm co-axial needle was advanced into a peripheral aspect of the  lesion. This was followed by 3 core biopsies with an 18 gauge core device under direct ultrasound guidance. The coaxial needle tract was embolized with a small amount of Gel-Foam slurry and superficial hemostasis was obtained with manual compression. Post procedural scanning was negative for definitive area of hemorrhage or additional complication. A dressing was placed. The patient tolerated the procedure well without immediate post procedural complication. IMPRESSION: Technically successful ultrasound guided core needle biopsy of right lobe hepatic mass. Ruthann Cancer, MD [facility] Electronically Signed   By: Ruthann Cancer MD   On: 04/23/2020 17:40   DG Chest Port 1 View  Result Date: 04/20/2020 CLINICAL DATA:  Shortness of breath.  Constipation.  Weakness. EXAM: PORTABLE CHEST 1 VIEW COMPARISON:  06/09/2018 FINDINGS: Altered mediastinal contour compared to the prior exam, I cannot exclude aortic aneurysm, mediastinal mass, or hematoma. CT of the chest (with contrast if feasible) is recommended for further workup. The lungs appear clear.  Thoracic spondylosis noted. IMPRESSION: 1. Abnormal mediastinal contour, substantially changed from 06/09/2018. I cannot exclude aortic aneurysm, mediastinal mass, or hematoma. CT of the chest (with contrast if feasible) is recommended for further workup. 2. Thoracic spondylosis. Electronically Signed   By: Van Clines M.D.   On: 04/20/2020 18:48      Subjective:  Patient seen and examined bedside, resting comfortably.  Spouse present.  Seen by medical oncology this afternoon and okay for discharge home.  Plan to start prednisone, allopurinol and Protonix after discharge.  Has follow-up scheduled on Monday, 04/30/2020.  Patient with no other complaints or concerns at this time.  Denies headache, no fever/chills/night sweats, no nausea/vomiting/diarrhea, no chest pain, no shortness of breath, no  palpitations, no abdominal pain.  No acute events overnight per nurse staff.  Discharge Exam: Vitals:   04/26/20 0545 04/26/20 0900  BP: (!) 143/76 (!) 145/76  Pulse: 87 (!) 108  Resp: 20 18  Temp: 99.4 F (37.4 C) 98.8 F (37.1 C)  SpO2: 97% 99%   Vitals:   04/25/20 2150 04/26/20 0130 04/26/20 0545 04/26/20 0900  BP: 132/68 130/62 (!) 143/76 (!) 145/76  Pulse: 96 94 87 (!) 108  Resp: 19 20 20 18   Temp: 98.3 F (36.8 C) 98.4 F (36.9 C) 99.4 F (37.4 C) 98.8 F (37.1 C)  TempSrc:   Oral Oral  SpO2: 94% 95% 97% 99%  Weight: 101 kg     Height:        General: Pt is alert, awake, not in acute distress Cardiovascular: RRR, S1/S2 +, no rubs, no gallops Respiratory: CTA bilaterally, no wheezing, no rhonchi Abdominal: Soft, NT, ND, bowel sounds + Extremities: no edema, no cyanosis    The results of significant diagnostics from this hospitalization (including imaging, microbiology, ancillary and laboratory) are listed below for reference.     Microbiology: Recent Results (from the past 240 hour(s))  Respiratory Panel by RT PCR (Flu A&B, Covid) - Nasopharyngeal Swab     Status: None   Collection Time: 04/20/20  6:25 PM   Specimen: Nasopharyngeal Swab  Result Value Ref Range Status   SARS Coronavirus 2 by RT PCR NEGATIVE NEGATIVE Final    Comment: (NOTE) SARS-CoV-2 target nucleic acids are NOT DETECTED.  The SARS-CoV-2 RNA is generally detectable in upper respiratoy specimens during the acute phase of infection. The lowest concentration of SARS-CoV-2 viral copies this assay can detect is 131 copies/mL. A negative result does not preclude SARS-Cov-2 infection and should not be used as the sole basis for treatment or other patient management decisions. A negative result may occur with  improper specimen collection/handling, submission of specimen other than nasopharyngeal swab, presence of viral mutation(s) within the areas targeted by this assay, and inadequate number  of viral copies (<131 copies/mL). A negative result must be combined with clinical observations, patient history, and epidemiological information. The expected result is Negative.  Fact Sheet for Patients:  PinkCheek.be  Fact Sheet for Healthcare Providers:  GravelBags.it  This test is no t yet approved or cleared by the [place] FDA and  has been authorized for detection and/or diagnosis of SARS-CoV-2 by FDA under an Emergency Use Authorization (EUA). This EUA will remain  in effect (meaning this test can be used) for the duration of the COVID-19 declaration under Section 564(b)(1) of the Act, 21 U.S.C. section 360bbb-3(b)(1), unless the authorization is terminated or revoked sooner.     Influenza A by PCR NEGATIVE NEGATIVE Final   Influenza B by PCR NEGATIVE NEGATIVE Final    Comment: (NOTE) The Xpert Xpress SARS-CoV-2/FLU/RSV assay is intended as an aid in  the diagnosis of influenza from Nasopharyngeal swab specimens and  should not be used as a sole basis for treatment. Nasal washings and  aspirates are unacceptable for Xpert Xpress SARS-CoV-2/FLU/RSV  testing.  Fact Sheet for  Patients: PinkCheek.be  Fact Sheet for Healthcare Providers: GravelBags.it  This test is not yet approved or cleared by the [place] FDA and  has been authorized for detection and/or diagnosis of SARS-CoV-2 by  FDA under an Emergency Use Authorization (EUA). This EUA will remain  in effect (meaning this test can be used) for the duration of the  Covid-19 declaration under Section 564(b)(1) of the Act, 21  U.S.C. section 360bbb-3(b)(1), unless the authorization is  terminated or revoked. Performed at [facility], [place] [address], [place], [place] [address]      Labs: BNP (last 3 results) No results for input(s): BNP in the last 8760 hours. Basic Metabolic  Panel: Recent Labs  Lab 04/20/20 1818 04/20/20 1818 04/21/20 0059 04/21/20 0059 04/22/20 0210 04/23/20 0232 04/24/20 0329 04/25/20 0027 04/26/20 0146  NA 130*   < > 136   < > 136 138 138 132* 139  K 4.1   < > 3.6   < > 3.4* 3.9 3.8 3.7 3.7  CL 91*   < > 101   < > 104 108 109 104 111  CO2 28   < > 27   < > 24 21* 21* 20* 21*  GLUCOSE 91   < > 89   < > 86 79 87 101* 89  BUN 43*   < > 41*   < > 41* 42* 47* 46* 40*  CREATININE 2.67*   < > 2.61*   < > 2.74* 2.75* 2.98* 2.76* 2.36*  CALCIUM >15.0*   < > >15.0*   < > 13.1* 12.1* 11.3* 10.3 10.3  MG 2.0  --   --   --   --  1.6* 1.9  --   --   PHOS  --   --  4.0  --   --   --   --   --   --    < > = values in this interval not displayed.   Liver Function Tests: Recent Labs  Lab 04/22/20 0210 04/23/20 0232 04/24/20 0329 04/25/20 0027 04/26/20 0146  AST 66* 72* 106* 82* 65*  ALT 43 51* 64* 51* 47*  ALKPHOS 95 127* 99 88 89  BILITOT 1.4* 1.4* 1.1 1.1 1.3*  PROT 4.5* 4.5* 4.2* 4.3* 4.4*  ALBUMIN 2.6* 2.5* 2.4* 2.4* 2.4*   No results for input(s): LIPASE, AMYLASE in the last 168 hours. No results for input(s): AMMONIA in the last 168 hours. CBC: Recent Labs  Lab 04/20/20 1818 04/20/20 1818 04/21/20 0059 04/21/20 0059 04/22/20 0210 04/22/20 0210 04/23/20 1801 04/23/20 2154 04/24/20 0329 04/24/20 0329 04/24/20 1209 04/25/20 0027 04/25/20 1519 04/25/20 2127 04/26/20 0146  WBC 5.8   < > 4.3   < > 4.0  --  6.5  --  5.0  --   --  4.0  --   --  4.0  NEUTROABS 4.4  --  3.2  --   --   --   --   --   --   --   --   --   --   --   --   HGB 12.2*   < > 10.9*   < > 10.5*   < > 9.5*   < > 8.3*   < > 8.3* 7.2* 8.3* 8.1* 8.1*  HCT 36.3*   < > 31.6*   < > 31.8*   < > 29.2*   < > 24.8*   < > 24.2* 21.3* 24.7* 24.1* 23.9*  MCV 87.3   < > 86.6   < > 86.4  --  89.6  --  86.7  --   --  85.2  --   --  86.9  PLT 150   < > 126*   < > 112*  --  134*  --  129*  --   --  116*  --   --  122*   < > = values in this interval not displayed.    Cardiac Enzymes: No results for input(s): CKTOTAL, CKMB, CKMBINDEX, TROPONINI in the last 168 hours. BNP: Invalid input(s): POCBNP CBG: Recent Labs  Lab 04/20/20 1857  GLUCAP 92   D-Dimer No results for input(s): DDIMER in the last 72 hours. Hgb A1c No results for input(s): HGBA1C in the last 72 hours. Lipid Profile No results for input(s): CHOL, HDL, LDLCALC, TRIG, CHOLHDL, LDLDIRECT in the last 72 hours. Thyroid function studies No results for input(s): TSH, T4TOTAL, T3FREE, THYROIDAB in the last 72 hours.  Invalid input(s): FREET3 Anemia work up No results for input(s): VITAMINB12, FOLATE, FERRITIN, TIBC, IRON, RETICCTPCT in the last 72 hours. Urinalysis    Component Value Date/Time   COLORURINE YELLOW 04/20/2020 1916   APPEARANCEUR CLEAR 04/20/2020 1916   LABSPEC 1.010 04/20/2020 1916   PHURINE 6.0 04/20/2020 1916   GLUCOSEU NEGATIVE 04/20/2020 1916   HGBUR NEGATIVE 04/20/2020 1916   BILIRUBINUR NEGATIVE 04/20/2020 1916   KETONESUR NEGATIVE 04/20/2020 1916   PROTEINUR NEGATIVE 04/20/2020 1916   UROBILINOGEN 0.2 01/24/2012 0709   NITRITE NEGATIVE 04/20/2020 1916   LEUKOCYTESUR NEGATIVE 04/20/2020 1916   Sepsis Labs Invalid input(s): PROCALCITONIN,  WBC,  LACTICIDVEN Microbiology Recent Results (from the past 240 hour(s))  Respiratory Panel by RT PCR (Flu A&B, Covid) - Nasopharyngeal Swab     Status: None   Collection Time: 04/20/20  6:25 PM   Specimen: Nasopharyngeal Swab  Result Value Ref Range Status   SARS Coronavirus 2 by RT PCR NEGATIVE NEGATIVE Final    Comment: (NOTE) SARS-CoV-2 target nucleic acids are NOT DETECTED.  The SARS-CoV-2 RNA is generally detectable in upper respiratoy specimens during the acute phase of infection. The lowest concentration of SARS-CoV-2 viral copies this assay can detect is 131 copies/mL. A negative result does not preclude SARS-Cov-2 infection and should not be used as the sole basis for treatment or other patient  management decisions. A negative result may occur with  improper specimen collection/handling, submission of specimen other than nasopharyngeal swab, presence of viral mutation(s) within the areas targeted by this assay, and inadequate number of viral copies (<131 copies/mL). A negative result must be combined with clinical observations, patient history, and epidemiological information. The expected result is Negative.  Fact Sheet for Patients:  PinkCheek.be  Fact Sheet for Healthcare Providers:  GravelBags.it  This test is no t yet approved or cleared by the [place] FDA and  has been authorized for detection and/or diagnosis of SARS-CoV-2 by FDA under an Emergency Use Authorization (EUA). This EUA will remain  in effect (meaning this test can be used) for the duration of the COVID-19 declaration under Section 564(b)(1) of the Act, 21 U.S.C. section 360bbb-3(b)(1), unless the authorization is terminated or revoked sooner.     Influenza A by PCR NEGATIVE NEGATIVE Final   Influenza B by PCR NEGATIVE NEGATIVE Final    Comment: (NOTE) The Xpert Xpress SARS-CoV-2/FLU/RSV assay is intended as an aid in  the diagnosis of influenza from Nasopharyngeal swab specimens and  should not be used as  a sole basis for treatment. Nasal washings and  aspirates are unacceptable for Xpert Xpress SARS-CoV-2/FLU/RSV  testing.  Fact Sheet for Patients: PinkCheek.be  Fact Sheet for Healthcare Providers: GravelBags.it  This test is not yet approved or cleared by the [place] FDA and  has been authorized for detection and/or diagnosis of SARS-CoV-2 by  FDA under an Emergency Use Authorization (EUA). This EUA will remain  in effect (meaning this test can be used) for the duration of the  Covid-19 declaration under Section 564(b)(1) of the Act, 21  U.S.C. section 360bbb-3(b)(1),  unless the authorization is  terminated or revoked. Performed at [facility], [place] [address] [place], [place] [address]      Time coordinating discharge: Over 30 minutes  SIGNED:   Kaylan Yates J [place] DO  [facility] 04/26/2020, 2:16 PM

## 2020-04-26 NOTE — Discharge Instructions (Signed)
Hypercalcemia Hypercalcemia is when the level of calcium in a person's blood is above normal. The body needs calcium to make bones and keep them strong. Calcium also helps the muscles, nerves, brain, and heart work the way they should. Most of the calcium in the body is in the bones. There is also some calcium in the blood. Hypercalcemia can happen when calcium comes out of the bones, or when the kidneys are not able to remove calcium from the blood. Hypercalcemia can be mild or severe. What are the causes? There are many possible causes of hypercalcemia. Common causes of this condition include:  Hyperparathyroidism. This is a condition in which the body produces too much parathyroid hormone. There are four parathyroid glands in your neck. These glands produce a chemical messenger (hormone) that helps the body absorb calcium from foods and helps your bones release calcium.  Certain kinds of cancer. Less common causes of hypercalcemia include:  Getting too much calcium or vitamin D from your diet.  Kidney failure.  Hyperthyroidism.  Severe dehydration.  Being on bed rest or being inactive for a long time.  Certain medicines.  Infections. What increases the risk? You are more likely to develop this condition if you:  Are male.  Are 60 years of age or older.  Have a family history of hypercalcemia. What are the signs or symptoms? Mild hypercalcemia that starts slowly may not cause symptoms. Severe, sudden hypercalcemia is more likely to cause symptoms, such as:  Being more thirsty than usual.  Needing to urinate more often than usual.  Abdominal pain.  Nausea and vomiting.  Constipation.  Muscle pain, twitching, or weakness.  Feeling very tired. How is this diagnosed?  Hypercalcemia is usually diagnosed with a blood test. You may also have tests to help determine what is causing this condition, such as imaging tests and more blood tests. How is this  treated? Treatment for hypercalcemia depends on the cause. Treatment may include:  Receiving fluids through an IV.  Medicines that: ? Keep calcium levels steady after receiving fluids (loop diuretics). ? Keep calcium in your bones (bisphosphonates). ? Lower the calcium level in your blood.  Surgery to remove overactive parathyroid glands.  A procedure that filters your blood to correct calcium levels (hemodialysis). Follow these instructions at home:   Take over-the-counter and prescription medicines only as told by your health care provider.  Follow instructions from your health care provider about eating or drinking restrictions.  Drink enough fluid to keep your urine pale yellow.  Stay active. Weight-bearing exercise helps to keep calcium in your bones. Follow instructions from your health care provider about what type and level of exercise is safe for you.  Keep all follow-up visits as told by your health care provider. This is important. Contact a health care provider if you have:  A fever.  A heartbeat that is irregular or very fast.  Changes in mood, memory, or personality. Get help right away if you:  Have severe abdominal pain.  Have chest pain.  Have trouble breathing.  Become very confused and sleepy.  Lose consciousness. Summary  Hypercalcemia is when the level of calcium in a person's blood is above normal. The body needs calcium to make bones and keep them strong. Calcium also helps the muscles, nerves, brain, and heart work the way they should.  There are many possible causes of hypercalcemia, and treatment depends on the cause.  Take over-the-counter and prescription medicines only as told by your health care   provider.  Follow instructions from your health care provider about eating or drinking restrictions. This information is not intended to replace advice given to you by your health care provider. Make sure you discuss any questions you have with  your health care provider. Document Revised: 06/22/2018 Document Reviewed: 03/01/2018 Elsevier Patient Education  2020 Elsevier Inc.  

## 2020-04-26 NOTE — Progress Notes (Signed)
SATURATION QUALIFICATIONS: (This note is used to comply with regulatory documentation for home oxygen)   Patient Saturations on Room Air while Ambulating = 98% 

## 2020-04-26 NOTE — Progress Notes (Signed)
Physical Therapy Treatment Patient Details Name: John [name] MRN: [id number] DOB: 02-Feb-1946 Today's Date: 04/26/2020    History of Present Illness 89y male c/o weakness, fatigue, and constipation; PCP found him to have critically high calcium levels and referred him to the ED. New mediastinal and liver masses found per CT. PMH HTN, R knee pain, foot surgery    PT Comments    Patient received in recliner, pleasant and cooperative with therapy. Tolerated significant increase in gait distance today with HR up to 121BPM and SpO2 >98% on room air; did well with RW but did need occasional cues for proximity to device and hand placement/sequencing when managing RW on transfers. Fatigued at end of gait distance. Reports he is starting chemo Tuesday- briefly educated on possible effects of CA treatments including peripheral neuropathy and increased fatigue, encouraged cancer speciality PT f/u if this becomes the case. Left up in recliner with all needs met, spouse present.    Follow Up Recommendations  Home health PT;Supervision for mobility/OOB     Equipment Recommendations  Rolling walker with 5" wheels;Other (comment);Wheelchair (measurements PT);Wheelchair cushion (measurements PT) (shower bench)    Recommendations for Other Services       Precautions / Restrictions Precautions Precautions: Other (comment) Precaution Comments: watch HR Restrictions Weight Bearing Restrictions: No    Mobility  Bed Mobility               General bed mobility comments: OOB in recliner upon entry  Transfers Overall transfer level: Needs assistance Equipment used: Rolling walker (2 wheeled) Transfers: Sit to/from Stand Sit to Stand: Supervision         General transfer comment: S for safety, cues for hand placement and RW management  Ambulation/Gait Ambulation/Gait assistance: Supervision Gait Distance (Feet): 120 Feet Assistive device: Rolling walker (2 wheeled) Gait  Pattern/deviations: Step-through pattern;Decreased step length - right;Decreased step length - left;Decreased stride length;Trunk flexed Gait velocity: decreased   General Gait Details: slow and steady with RW, occasional cues for proximity to device; HR 121BPM at most, SpO2 >98% on RA with activity   Stairs             Wheelchair Mobility    Modified Rankin (Stroke Patients Only)       Balance Overall balance assessment: Needs assistance Sitting-balance support: No upper extremity supported;Feet supported Sitting balance-Leahy Scale: Good     Standing balance support: Bilateral upper extremity supported;During functional activity Standing balance-Leahy Scale: Good Standing balance comment: standing with BUE supports                             Cognition Arousal/Alertness: Awake/alert Behavior During Therapy: WFL for tasks assessed/performed Overall Cognitive Status: Within Functional Limits for tasks assessed                                        Exercises      General Comments General comments (skin integrity, edema, etc.): HR to 121BPM at most today      Pertinent Vitals/Pain Pain Assessment: Faces Faces Pain Scale: Hurts a little bit Pain Location: generalized discomfort Pain Descriptors / Indicators: Discomfort Pain Intervention(s): Limited activity within patient's tolerance;Monitored during session    Home Living                      Prior Function  PT Goals (current goals can now be found in the care plan section) Acute Rehab PT Goals Patient Stated Goal: go home today PT Goal Formulation: With patient/family Time For Goal Achievement: 05/08/20 Progress towards PT goals: Progressing toward goals    Frequency    Min 3X/week      PT Plan Current plan remains appropriate    Co-evaluation              AM-PAC PT "6 Clicks" Mobility   Outcome Measure  Help needed turning from your  back to your side while in a flat bed without using bedrails?: A Little Help needed moving from lying on your back to sitting on the side of a flat bed without using bedrails?: A Little Help needed moving to and from a bed to a chair (including a wheelchair)?: A Little Help needed standing up from a chair using your arms (e.g., wheelchair or bedside chair)?: None Help needed to walk in hospital room?: A Little Help needed climbing 3-5 steps with a railing? : A Little 6 Click Score: 19    End of Session   Activity Tolerance: Patient tolerated treatment well Patient left: in chair;with call bell/phone within reach;with family/visitor present Nurse Communication: Mobility status;Other (comment) (sats/HR on RA) PT Visit Diagnosis: Unsteadiness on feet (R26.81);Muscle weakness (generalized) (M62.81)     Time: 8016-5537 PT Time Calculation (min) (ACUTE ONLY): 15 min  Charges:  $Gait Training: 8-22 mins                    Windell Norfolk, DPT, PN1   Supplemental Physical Therapist [place]    Pager [phone] Acute Rehab Office [phone]    [name] 04/26/2020, 3:03 PM

## 2020-04-26 NOTE — Progress Notes (Signed)
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

## 2020-04-26 NOTE — Progress Notes (Signed)
[facility] notified that patient is having fine crackles left lower NS @100  mls running.Arthor Captain LPN

## 2020-04-26 NOTE — Care Management Important Message (Signed)
Important Message  Patient Details  Name: John [name] MRN: [id number] Date of Birth: [birth year]   Medicare Important Message Given:  Yes     Barb Merino [place] 04/26/2020, 2:54 PM

## 2020-04-26 NOTE — Plan of Care (Signed)
Patient discharged to go home today

## 2020-04-27 ENCOUNTER — Telehealth: Payer: Self-pay

## 2020-04-27 ENCOUNTER — Other Ambulatory Visit: Payer: Self-pay

## 2020-04-27 DIAGNOSIS — Z9221 Personal history of antineoplastic chemotherapy: Secondary | ICD-10-CM

## 2020-04-27 DIAGNOSIS — C8590 Non-Hodgkin lymphoma, unspecified, unspecified site: Secondary | ICD-10-CM

## 2020-04-27 NOTE — Telephone Encounter (Signed)
I spoke with John [name] and reviewed his upcoming appts, date, time and location.  He verbalized understanding.

## 2020-04-27 NOTE — Progress Notes (Signed)
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 Large anterior mediastinal/prevascular space mass/adenopathy. The mass extends superiorly and abuts the upper trachea. 2. Large hypodense lesion in the right lobe of the liver as well as ill-defined splenic hypodense lesions. Further characterization with MRI without and with contrast recommended. The liver lesion is amenable to percutaneous tissue sampling. 3. Extensive mesenteric and retroperitoneal adenopathy. 4. Sigmoid diverticulosis. No bowel obstruction. Normal appendix. 5. Aortic Atherosclerosis (ICD10-I70.0).   04/23/2020 Cancer Staging   Staging form: Hodgkin and Non-Hodgkin Lymphoma, AJCC 8th Edition - Clinical stage from 04/23/2020: Stage IV (Diffuse large B-cell lymphoma) - Signed by Truitt Merle, MD on 04/29/2020   04/23/2020 Initial Biopsy   FINAL MICROSCOPIC DIAGNOSIS:   A. LIVER, RIGHT MASS, NEEDLE CORE BIOPSY:  - Large B-cell lymphoma  - See comment     COMMENT:   The sections show needle core biopsy fragments displaying effacement of  the architecture by a dense infiltrate of primarily large atypical  lymphoid appearing cells displaying vesicular chromatin and small  nucleoli.  This is associated with apoptosis and brisk mitosis.  The  appearance is diffuse with lack of atypical follicles.  A large battery  of  immunohistochemical stains was performed and shows that the atypical  lymphoid cells are positive for LCA, CD20, PAX 5, BCL-2, BCL6, CD5,  MUM-1 (partial).  The atypical lymphoid cells are negative for CD10,  CD30, CD34, CD138, cyclin D1, TdT, and EBV in situ hybridization,  synaptophysin, cytokeratin AE1/AE3, cytokeratin 20, cytokeratin 7,  cytokeratin 5/6, TTF-1, CD56, CDX2, and p40.  There is an admixed  relatively minor population of T-cells as primarily seen with CD3.  The  overall features are consistent with large B-cell lymphoma, ABC subtype.  The lymphomatous process displays expression of CD5.  This phenotype is  seen in 5-10% of mostly de novo large B-cell lymphoma cases or rarely as  a transformation of a low-grade B-cell lymphoma such as small  lymphocytic lymphoma.  Clinical correlation is recommended.    04/25/2020 Imaging   CT AP  IMPRESSION: 1. Development of high-density ascites in the abdomen and pelvis. Findings are most compatible with a small to moderate amount of hemoperitoneum and related to the recent liver biopsy. 2. Extensive lymphadenopathy throughout the abdomen and pelvis with a large mass or nodal mass near the pancreatic head. There are additional lesions involving the liver and spleen. Findings are suggestive for metastatic disease. 3. Development of small bilateral pleural effusions.   These results were called by telephone at the time of interpretation on 04/25/2020 at 1:43 pm to provider ERIC [place] , who verbally acknowledged these results.   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    05/01/2020 -  Chemotherapy  The patient had pegfilgrastim-cbqv (UDENYCA) injection 6 mg, 6 mg, Subcutaneous, Once, 0 of 4 cycles DOXOrubicin (ADRIAMYCIN) 22 mg, etoposide (VEPESID) 112 mg,  vinCRIStine (ONCOVIN) 0.9 mg in sodium chloride 0.9 % 500 mL chemo infusion, , Intravenous, Once, 0 of 4 cycles ondansetron (ZOFRAN) 8 mg, dexamethasone (DECADRON) 10 mg in sodium chloride 0.9 % 50 mL IVPB, , Intravenous,  Once, 0 of 4 cycles cyclophosphamide (CYTOXAN) 1,680 mg in sodium chloride 0.9 % 250 mL chemo infusion, 750 mg/m2, Intravenous,  Once, 0 of 4 cycles riTUXimab-pvvr (RUXIENCE) 800 mg in sodium chloride 0.9 % 250 mL (2.4242 mg/mL) infusion, 375 mg/m2, Intravenous,  Once, 0 of 4 cycles  for chemotherapy treatment.       CURRENT THERAPY:  EPOCH every 2-3 weeks for 6 cycles starting 05/01/20 with intrathecal prophylactic treatment   INTERVAL HISTORY:  John [name] is here for a follow up. He was released from hospital on 04/26/20. He presents to the clinic with his wife. He notes he is fatigued today he notes since leaving hospital he has improved fatigue and eating. His wife notes no more confusion. He denies breathing issues. He notes occasional red eye he denies any vision change. He is interested in inpatient treatment and prophylactic spinal tap to brain.   I reviewed his medical history. He has HTN and had 2 surgeries. His mother had skin cancer. He does not drink or smoke. He has no biological child. 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 He notes he was very active at baseline and walking or yard word daily before cancer diagnosis. He worked part time for [employer].    REVIEW OF SYSTEMS:   Constitutional: Denies fevers, chills or abnormal weight loss (+) improved fatigue Eyes: Denies blurriness of vision Ears, nose, mouth, throat, and face: Denies mucositis or sore throat Respiratory: Denies cough, dyspnea or wheezes Cardiovascular: Denies palpitation, chest discomfort or lower extremity swelling Gastrointestinal:  Denies nausea, heartburn or change in bowel habits Skin: Denies abnormal skin rashes Lymphatics: Denies new lymphadenopathy or easy  bruising Neurological:Denies numbness, tingling or new weaknesses Behavioral/Psych: Mood is stable, no new changes  All other systems were reviewed with the patient and are negative.  MEDICAL HISTORY:  Past Medical History:  Diagnosis Date  . Hypertension   . Knee pain, right     SURGICAL HISTORY: Past Surgical History:  Procedure Laterality Date  . FOOT SURGERY    . TONSILLECTOMY      I have reviewed the social history and family history with the patient and they are unchanged from previous note.  ALLERGIES:  has No Known Allergies.  MEDICATIONS:  Current Outpatient Medications  Medication Sig Dispense Refill  . allopurinol (ZYLOPRIM) 100 MG tablet Take 1 tablet (100 mg total) by mouth daily. 30 tablet 0  . Ascorbic Acid (VITAMIN C WITH ROSE HIPS) 1000 MG tablet Take 1,000 mg by mouth daily.    Marland Kitchen aspirin EC 81 MG tablet Take 81 mg by mouth daily.    . Calcium Citrate-Vitamin D (CALCIUM CITRATE + D PO) Take 1 tablet by mouth daily.    . clobetasol ointment (TEMOVATE) 7.41 % Apply 1 application topically 2 (two) times daily.    Marland Kitchen docusate sodium (COLACE) 100 MG capsule Take 100 mg by mouth 2 (two) times daily as needed for mild constipation.    Marland Kitchen doxazosin (CARDURA) 4 MG tablet Take 4 mg by mouth daily.    . metoprolol tartrate (LOPRESSOR) 25 MG tablet Take 0.5  tablets (12.5 mg total) by mouth 2 (two) times daily. 30 tablet 0  . Multiple Vitamins-Minerals (MULTIVITAMIN PO) Take 1 tablet by mouth daily.    . Omega-3 Fatty Acids (OMEGA 3 PO) Take 1 tablet by mouth daily.    . pantoprazole (PROTONIX) 40 MG tablet Take 1 tablet (40 mg total) by mouth daily. 30 tablet 11  . polyethylene glycol powder (GLYCOLAX/MIRALAX) 17 GM/SCOOP powder Take 17 g by mouth daily.    . predniSONE (DELTASONE) 20 MG tablet Take 3 tablets (60 mg total) by mouth daily for 5 days. 15 tablet 0  . Specialty Vitamins Products (PROSTATE PO) Take 2 tablets by mouth daily.    Marland Kitchen triamcinolone cream (KENALOG) 0.1  % Apply 1 application topically 3 (three) times daily as needed for rash.     No current facility-administered medications for this visit.    PHYSICAL EXAMINATION: ECOG PERFORMANCE STATUS: 1 - Symptomatic but completely ambulatory  Vitals:   04/30/20 1219  BP: (!) 158/80  Pulse: 96  Resp: 18  Temp: 98.3 F (36.8 C)  SpO2: 100%   Filed Weights   04/30/20 1219  Weight: 223 lb 4.8 oz (101.3 kg)    Due to COVID19 we will limit examination to appearance. Patient had no complaints.  GENERAL:alert, no distress and comfortable SKIN: skin color normal, no rashes or significant lesions EYES: normal, Conjunctiva are pink and non-injected, sclera clear  NEURO: alert & oriented x 3 with fluent speech   LABORATORY DATA:  I have reviewed the data as listed CBC Latest Ref Rng & Units 04/30/2020 04/26/2020 04/25/2020  WBC 4.0 - 10.5 K/uL 6.7 4.0 -  Hemoglobin 13.0 - 17.0 g/dL 9.4(L) 8.1(L) 8.1(L)  Hematocrit 39 - 52 % 27.9(L) 23.9(L) 24.1(L)  Platelets 150 - 400 K/uL 184 122(L) -     CMP Latest Ref Rng & Units 04/30/2020 04/26/2020 04/25/2020  Glucose 70 - 99 mg/dL 106(H) 89 101(H)  BUN 8 - 23 mg/dL 40(H) 40(H) 46(H)  Creatinine 0.61 - 1.24 mg/dL 1.75(H) 2.36(H) 2.76(H)  Sodium 135 - 145 mmol/L 138 139 132(L)  Potassium 3.5 - 5.1 mmol/L 4.3 3.7 3.7  Chloride 98 - 111 mmol/L 104 111 104  CO2 22 - 32 mmol/L 24 21(L) 20(L)  Calcium 8.9 - 10.3 mg/dL 12.3(H) 10.3 10.3  Total Protein 6.5 - 8.1 g/dL 5.8(L) 4.4(L) 4.3(L)  Total Bilirubin 0.3 - 1.2 mg/dL 1.0 1.3(H) 1.1  Alkaline Phos 38 - 126 U/L 126 89 88  AST 15 - 41 U/L 55(H) 65(H) 82(H)  ALT 0 - 44 U/L 76(H) 47(H) 51(H)      RADIOGRAPHIC STUDIES: I have personally reviewed the radiological images as listed and agreed with the findings in the report. No results found.   ASSESSMENT & PLAN:  John [name] is a 74 y.o. male with    1. Diffuse large B cell lymphoma, ABC Subtype, stage IV  -After worsening fatigue pt presented  to ED. 04/20/20 CT CAP showed large mediastinal mass and diffuse liver masses, and diffuse retroperitoneal lymphadenopathy. 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 Given his liver metastasis and LN involvement, this is stage IV. His lymphoma FISH panel is still pending to rule out double hit lymphoma  -I discussed given his stage IV aggressive disease I recommend systemic treatment as soon as possible. This disease has a 40-50% cure rate with traditional R-CHOP. I discussed treatment options of outpatient R-CHOP or inpatient EPOCH-R every 2-3  weeks for 6 cycles. I discussed EPOCH-R is more intensive which will increase his cure rate but come with more side effects such as tumor lysis syndrome.  Due to the more aggressive nature of ABC subtype I recommend inpatient R-EPOCH regimen, chemo consent is obtained. He is interested in more intensive inpatient treatment.  I plan to give a total of 6 cycles. -He is also at high risk for CNS involvement due to his high risk features (advanced age, elevated LDH, stage IV with extranodal metastasis). I also discussed intrathecal treatment with methotrexate prophylactically in his spine or brain reservoir given risk of disease metastasis to his spinal fluid and brain. He is agreeable with intrathecal treatment. Will add before or after C2.  -I recommend PET for baseline to monitor on treatment, and restaging with PET avid cycle 3 and cycle 6. He is agreeable. I discussed if there is residual disease after treatment may recommend target radiation.  -Labs reviewed today, Hg 9.4, BG 106, BUN 40, Cr 1.75, Ca 12.3, protein 5.8, albumin 3.2, AST 55, ALT 76 -Proceed with start of treatment tomorrow.   2. Fatigue, Low appetite   -secondary to #1  -Improved on prednisone, eating has improved as well.   3. Anemia, intra-abdominal hemorrhage after liver biopsy -secondary to #1 and bleeding after liver  biopsy -He required blood transfusion on 04/27/20 after liver biopsy -Hg improved to 9.4 today (04/30/20). Will monitor on treatment.   4. AKI and tumor lysis -Cr is improving, Cr at 1.75 today, BUN 40 (04/30/20) -He required a dose of rasburicase in the hospital  5. Hypercalcemia  -Secondary to #1 -Ca increased again to 12.3 (04/30/20). I discussed treatment will also control this. Will start tomorrow.    PLAN:  -Chemo education class today  -PICC line place tomorrow morning at 8am -[facility] admit for [facility] tomorrow over 5 days. Will reduce etoposide due to AKI -Plan to give Rituxan and G-CSF on day 7 or day 8 in office  -port placement, intrathecal methotrexate in 2 to 3 weeks  -PET in 2 weeks.  -lab and f/u in one week with injection and Rituxan   No problem-specific Assessment & Plan notes found for this encounter.   Orders Placed This Encounter  Procedures  . NM PET Image Initial (PI) Skull Base To Thigh    Standing Status:   Future    Standing Expiration Date:   04/30/2021    Order Specific Question:   If indicated for the ordered procedure, I authorize the administration of a radiopharmaceutical per Radiology protocol    Answer:   Yes    Order Specific Question:   Preferred imaging location?    Answer:   [facility]  . Uric acid    Standing Status:   Future    Number of Occurrences:   1    Standing Expiration Date:   04/30/2021   All questions were answered. The patient knows to call the clinic with any problems, questions or concerns. No barriers to learning was detected. The total time spent in the appointment was 40 minutes.     Truitt Merle, MD 04/30/2020   I, Joslyn Devon, am acting as scribe for Truitt Merle, MD.   I have reviewed the above documentation for accuracy and completeness, and I agree with the above.

## 2020-04-30 ENCOUNTER — Ambulatory Visit (HOSPITAL_BASED_OUTPATIENT_CLINIC_OR_DEPARTMENT_OTHER)
Admit: 2020-04-30 | Discharge: 2020-04-30 | Disposition: A | Discharge status: Home / Self Care | Payer: Medicare HMO | Source: Ambulatory Visit | Attending: Hematology | Admitting: Hematology

## 2020-04-30 ENCOUNTER — Other Ambulatory Visit: Payer: Self-pay

## 2020-04-30 ENCOUNTER — Inpatient Hospital Stay: Payer: Medicare HMO | Attending: Hematology

## 2020-04-30 ENCOUNTER — Inpatient Hospital Stay: Payer: Medicare HMO

## 2020-04-30 ENCOUNTER — Encounter: Payer: Self-pay | Admitting: Hematology

## 2020-04-30 ENCOUNTER — Inpatient Hospital Stay (HOSPITAL_BASED_OUTPATIENT_CLINIC_OR_DEPARTMENT_OTHER): Payer: Medicare HMO | Admitting: Hematology

## 2020-04-30 VITALS — BP 158/80 | HR 96 | Temp 98.3°F | Resp 18 | Ht 71.0 in | Wt 223.3 lb

## 2020-04-30 DIAGNOSIS — C8593 Non-Hodgkin lymphoma, unspecified, intra-abdominal lymph nodes: Secondary | ICD-10-CM

## 2020-04-30 DIAGNOSIS — C8339 Diffuse large B-cell lymphoma, extranodal and solid organ sites: Secondary | ICD-10-CM

## 2020-04-30 DIAGNOSIS — Z0189 Encounter for other specified special examinations: Secondary | ICD-10-CM

## 2020-04-30 DIAGNOSIS — N179 Acute kidney failure, unspecified: Secondary | ICD-10-CM | POA: Insufficient documentation

## 2020-04-30 DIAGNOSIS — Z01810 Encounter for preprocedural cardiovascular examination: Secondary | ICD-10-CM | POA: Insufficient documentation

## 2020-04-30 DIAGNOSIS — D649 Anemia, unspecified: Secondary | ICD-10-CM | POA: Insufficient documentation

## 2020-04-30 DIAGNOSIS — E883 Tumor lysis syndrome: Secondary | ICD-10-CM | POA: Insufficient documentation

## 2020-04-30 DIAGNOSIS — I119 Hypertensive heart disease without heart failure: Secondary | ICD-10-CM | POA: Insufficient documentation

## 2020-04-30 DIAGNOSIS — C8590 Non-Hodgkin lymphoma, unspecified, unspecified site: Secondary | ICD-10-CM

## 2020-04-30 DIAGNOSIS — Z5189 Encounter for other specified aftercare: Secondary | ICD-10-CM | POA: Insufficient documentation

## 2020-04-30 LAB — ECHOCARDIOGRAM COMPLETE
Area-P 1/2: 2.69 cm2
S' Lateral: 2.6 cm

## 2020-04-30 LAB — CMP (CANCER CENTER ONLY)
ALT: 76 U/L — ABNORMAL HIGH (ref 0–44)
AST: 55 U/L — ABNORMAL HIGH (ref 15–41)
Albumin: 3.2 g/dL — ABNORMAL LOW (ref 3.5–5.0)
Alkaline Phosphatase: 126 U/L (ref 38–126)
Anion gap: 10 (ref 5–15)
BUN: 40 mg/dL — ABNORMAL HIGH (ref 8–23)
CO2: 24 mmol/L (ref 22–32)
Calcium: 12.3 mg/dL — ABNORMAL HIGH (ref 8.9–10.3)
Chloride: 104 mmol/L (ref 98–111)
Creatinine: 1.75 mg/dL — ABNORMAL HIGH (ref 0.61–1.24)
GFR, Estimated: 41 mL/min — ABNORMAL LOW (ref >60)
Glucose, Bld: 106 mg/dL — ABNORMAL HIGH (ref 70–99)
Potassium: 4.3 mmol/L (ref 3.5–5.1)
Sodium: 138 mmol/L (ref 135–145)
Total Bilirubin: 1 mg/dL (ref 0.3–1.2)
Total Protein: 5.8 g/dL — ABNORMAL LOW (ref 6.5–8.1)

## 2020-04-30 LAB — CBC WITH DIFFERENTIAL (CANCER CENTER ONLY)
Abs Immature Granulocytes: 0.11 10*3/uL — ABNORMAL HIGH (ref 0.00–0.07)
Basophils Absolute: 0 10*3/uL (ref 0.0–0.1)
Basophils Relative: 0 %
Eosinophils Absolute: 0 10*3/uL (ref 0.0–0.5)
Eosinophils Relative: 0 %
HCT: 27.9 % — ABNORMAL LOW (ref 39.0–52.0)
Hemoglobin: 9.4 g/dL — ABNORMAL LOW (ref 13.0–17.0)
Immature Granulocytes: 2 %
Lymphocytes Relative: 5 %
Lymphs Abs: 0.4 10*3/uL — ABNORMAL LOW (ref 0.7–4.0)
MCH: 29.1 pg (ref 26.0–34.0)
MCHC: 33.7 g/dL (ref 30.0–36.0)
MCV: 86.4 fL (ref 80.0–100.0)
Monocytes Absolute: 0.5 10*3/uL (ref 0.1–1.0)
Monocytes Relative: 8 %
Neutro Abs: 5.7 10*3/uL (ref 1.7–7.7)
Neutrophils Relative %: 85 %
Platelet Count: 184 10*3/uL (ref 150–400)
RBC: 3.23 MIL/uL — ABNORMAL LOW (ref 4.22–5.81)
RDW: 14.1 % (ref 11.5–15.5)
WBC Count: 6.7 10*3/uL (ref 4.0–10.5)
nRBC: 0 % (ref 0.0–0.2)

## 2020-04-30 LAB — HEPATITIS B SURFACE ANTIGEN: Hepatitis B Surface Ag: NONREACTIVE

## 2020-04-30 LAB — URIC ACID: Uric Acid, Serum: 6.4 mg/dL (ref 3.7–8.6)

## 2020-04-30 LAB — HEPATITIS B CORE ANTIBODY, TOTAL: Hep B Core Total Ab: NONREACTIVE

## 2020-04-30 NOTE — Progress Notes (Signed)
DISCONTINUE OFF PATHWAY REGIMEN - Lymphoma and CLL   [id number]:R-CHOP + G-CSF q21 Days:   A cycle is every 21 days:     Prednisone      Rituximab-xxxx      Cyclophosphamide      Doxorubicin      Vincristine      Pegfilgrastim-xxxx   **Always confirm dose/schedule in your pharmacy ordering system**  REASON: Other Reason PRIOR TREATMENT: Off Pathway: R-CHOP + G-CSF q21 Days TREATMENT RESPONSE: Unable to Evaluate  START ON PATHWAY REGIMEN - Lymphoma and CLL     A cycle is every 21 days:     Prednisone      Rituximab-xxxx      Etoposide      Doxorubicin      Vincristine      Cyclophosphamide      Filgrastim-xxxx   **Always confirm dose/schedule in your pharmacy ordering system**  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

## 2020-04-30 NOTE — Progress Notes (Signed)
Echocardiogram 2D Echocardiogram has been performed.  John [name] John [name] 04/30/2020, 8:16 AM

## 2020-04-30 NOTE — Progress Notes (Signed)
Request placed for admission 05/01/2020 for inpatient chemotherapy.  Bed placement will call me with room number.

## 2020-05-01 ENCOUNTER — Inpatient Hospital Stay (HOSPITAL_COMMUNITY)
Admission: RE | Admit: 2020-05-01 | Discharge: 2020-05-07 | DRG: 847 | Disposition: A | Discharge status: Home / Self Care | Payer: Medicare HMO | Attending: Hematology | Admitting: Hematology

## 2020-05-01 ENCOUNTER — Ambulatory Visit: Payer: Medicare HMO

## 2020-05-01 ENCOUNTER — Encounter (HOSPITAL_COMMUNITY): Payer: Self-pay | Admitting: Hematology

## 2020-05-01 ENCOUNTER — Ambulatory Visit (HOSPITAL_COMMUNITY)
Admission: RE | Admit: 2020-05-01 | Discharge: 2020-05-01 | Disposition: A | Discharge status: Home / Self Care | Payer: Medicare HMO | Source: Ambulatory Visit | Attending: Hematology | Admitting: Hematology

## 2020-05-01 DIAGNOSIS — G4700 Insomnia, unspecified: Secondary | ICD-10-CM | POA: Diagnosis not present

## 2020-05-01 DIAGNOSIS — C787 Secondary malignant neoplasm of liver and intrahepatic bile duct: Secondary | ICD-10-CM | POA: Diagnosis present

## 2020-05-01 DIAGNOSIS — C8330 Diffuse large B-cell lymphoma, unspecified site: Secondary | ICD-10-CM | POA: Diagnosis present

## 2020-05-01 DIAGNOSIS — R140 Abdominal distension (gaseous): Secondary | ICD-10-CM | POA: Diagnosis not present

## 2020-05-01 DIAGNOSIS — C8339 Diffuse large B-cell lymphoma, extranodal and solid organ sites: Secondary | ICD-10-CM | POA: Diagnosis present

## 2020-05-01 DIAGNOSIS — Z7189 Other specified counseling: Secondary | ICD-10-CM

## 2020-05-01 DIAGNOSIS — G4733 Obstructive sleep apnea (adult) (pediatric): Secondary | ICD-10-CM

## 2020-05-01 DIAGNOSIS — Z5111 Encounter for antineoplastic chemotherapy: Secondary | ICD-10-CM | POA: Diagnosis present

## 2020-05-01 DIAGNOSIS — D630 Anemia in neoplastic disease: Secondary | ICD-10-CM | POA: Diagnosis present

## 2020-05-01 DIAGNOSIS — N400 Enlarged prostate without lower urinary tract symptoms: Secondary | ICD-10-CM | POA: Diagnosis present

## 2020-05-01 DIAGNOSIS — K5900 Constipation, unspecified: Secondary | ICD-10-CM | POA: Diagnosis not present

## 2020-05-01 DIAGNOSIS — Z7982 Long term (current) use of aspirin: Secondary | ICD-10-CM | POA: Diagnosis not present

## 2020-05-01 DIAGNOSIS — N179 Acute kidney failure, unspecified: Secondary | ICD-10-CM

## 2020-05-01 DIAGNOSIS — I10 Essential (primary) hypertension: Secondary | ICD-10-CM | POA: Diagnosis present

## 2020-05-01 DIAGNOSIS — C8590 Non-Hodgkin lymphoma, unspecified, unspecified site: Secondary | ICD-10-CM

## 2020-05-01 DIAGNOSIS — C8338 Diffuse large B-cell lymphoma, lymph nodes of multiple sites: Secondary | ICD-10-CM | POA: Diagnosis not present

## 2020-05-01 DIAGNOSIS — Z20822 Contact with and (suspected) exposure to covid-19: Secondary | ICD-10-CM | POA: Diagnosis present

## 2020-05-01 DIAGNOSIS — Z0000 Encounter for general adult medical examination without abnormal findings: Secondary | ICD-10-CM

## 2020-05-01 HISTORY — DX: Malignant (primary) neoplasm, unspecified: C80.1

## 2020-05-01 LAB — RESP PANEL BY RT-PCR (FLU A&B, COVID) ARPGX2
Influenza A by PCR: NEGATIVE
Influenza B by PCR: NEGATIVE
SARS Coronavirus 2 by RT PCR: NEGATIVE

## 2020-05-01 MED ORDER — DOXAZOSIN MESYLATE 4 MG PO TABS
4.0000 mg | ORAL_TABLET | Freq: Every day | ORAL | Status: DC
  Administered 2020-05-02 – 2020-05-07 (×6): 4 mg via ORAL
  Filled 2020-05-01 (×6): qty 1

## 2020-05-01 MED ORDER — ALLOPURINOL 100 MG PO TABS
100.0000 mg | ORAL_TABLET | Freq: Every day | ORAL | Status: DC
  Administered 2020-05-02 – 2020-05-07 (×6): 100 mg via ORAL
  Filled 2020-05-01 (×6): qty 1

## 2020-05-01 MED ORDER — DOCUSATE SODIUM 100 MG PO CAPS
100.0000 mg | ORAL_CAPSULE | Freq: Two times a day (BID) | ORAL | Status: DC
  Administered 2020-05-01 – 2020-05-03 (×6): 100 mg via ORAL
  Filled 2020-05-01 (×6): qty 1

## 2020-05-01 MED ORDER — COLD PACK MISC ONCOLOGY
1.0000 | Freq: Once | Status: DC | PRN
  Filled 2020-05-01: qty 1

## 2020-05-01 MED ORDER — SODIUM BICARBONATE/SODIUM CHLORIDE MOUTHWASH
Freq: Four times a day (QID) | OROMUCOSAL | Status: DC
  Administered 2020-05-04 – 2020-05-06 (×6): 1 via OROMUCOSAL
  Filled 2020-05-01: qty 1000

## 2020-05-01 MED ORDER — SODIUM CHLORIDE 0.9 % IV SOLN
Freq: Once | INTRAVENOUS | Status: AC
  Administered 2020-05-01: 18 mg via INTRAVENOUS
  Filled 2020-05-01: qty 4

## 2020-05-01 MED ORDER — METOPROLOL TARTRATE 25 MG PO TABS
12.5000 mg | ORAL_TABLET | Freq: Two times a day (BID) | ORAL | Status: DC
  Administered 2020-05-01 – 2020-05-07 (×12): 12.5 mg via ORAL
  Filled 2020-05-01 (×12): qty 1

## 2020-05-01 MED ORDER — TRIAMCINOLONE ACETONIDE 0.1 % EX CREA: 1.0000 "application " | TOPICAL_CREAM | Freq: Three times a day (TID) | CUTANEOUS | Status: DC | PRN

## 2020-05-01 MED ORDER — CHLORHEXIDINE GLUCONATE CLOTH 2 % EX PADS
6.0000 | MEDICATED_PAD | Freq: Every day | CUTANEOUS | Status: DC
  Administered 2020-05-01 – 2020-05-07 (×7): 6 via TOPICAL

## 2020-05-01 MED ORDER — PROCHLORPERAZINE MALEATE 10 MG PO TABS
10.0000 mg | ORAL_TABLET | Freq: Four times a day (QID) | ORAL | Status: DC | PRN
  Administered 2020-05-06: 10 mg via ORAL
  Filled 2020-05-01: qty 1

## 2020-05-01 MED ORDER — DOCUSATE SODIUM 100 MG PO CAPS: 100.0000 mg | ORAL_CAPSULE | Freq: Two times a day (BID) | ORAL | Status: DC | PRN

## 2020-05-01 MED ORDER — PREDNISONE 20 MG PO TABS: 60.0000 mg | ORAL_TABLET | Freq: Every day | ORAL | Status: DC

## 2020-05-01 MED ORDER — ASPIRIN EC 81 MG PO TBEC
81.0000 mg | DELAYED_RELEASE_TABLET | Freq: Every day | ORAL | Status: DC
  Administered 2020-05-02 – 2020-05-07 (×6): 81 mg via ORAL
  Filled 2020-05-01 (×6): qty 1

## 2020-05-01 MED ORDER — PANTOPRAZOLE SODIUM 40 MG PO TBEC
40.0000 mg | DELAYED_RELEASE_TABLET | Freq: Every day | ORAL | Status: DC
  Administered 2020-05-02 – 2020-05-07 (×6): 40 mg via ORAL
  Filled 2020-05-01 (×7): qty 1

## 2020-05-01 MED ORDER — POLYETHYLENE GLYCOL 3350 17 G PO PACK: 17.0000 g | PACK | Freq: Every day | ORAL | Status: DC | PRN

## 2020-05-01 MED ORDER — SODIUM CHLORIDE 0.9% FLUSH
10.0000 mL | Freq: Two times a day (BID) | INTRAVENOUS | Status: DC
  Administered 2020-05-01: 10 mL
  Administered 2020-05-01: 20 mL
  Administered 2020-05-02: 10 mL
  Administered 2020-05-03: 20 mL
  Administered 2020-05-06: 10 mL
  Administered 2020-05-06: 20 mL

## 2020-05-01 MED ORDER — VINCRISTINE SULFATE CHEMO INJECTION 1 MG/ML
Freq: Once | INTRAVENOUS | Status: AC
  Filled 2020-05-01: qty 11

## 2020-05-01 MED ORDER — LIDOCAINE HCL 1 % IJ SOLN
INTRAMUSCULAR | Status: AC
  Filled 2020-05-01: qty 20

## 2020-05-01 MED ORDER — POLYETHYLENE GLYCOL 3350 17 GM/SCOOP PO POWD: 17.0000 g | Freq: Every day | ORAL | Status: DC

## 2020-05-01 MED ORDER — HOT PACK MISC ONCOLOGY
1.0000 | Freq: Once | Status: DC | PRN
  Filled 2020-05-01: qty 1

## 2020-05-01 MED ORDER — SODIUM CHLORIDE 0.9 % IV SOLN: INTRAVENOUS | Status: DC

## 2020-05-01 MED ORDER — SODIUM CHLORIDE 0.9% FLUSH
10.0000 mL | INTRAVENOUS | Status: DC | PRN
  Administered 2020-05-04: 20 mL

## 2020-05-01 MED ORDER — LIDOCAINE HCL 1 % IJ SOLN
INTRAMUSCULAR | Status: DC | PRN
  Administered 2020-05-01: 10 mL

## 2020-05-01 MED ORDER — POLYETHYLENE GLYCOL 3350 17 G PO PACK
17.0000 g | PACK | Freq: Every day | ORAL | Status: DC
  Administered 2020-05-02 – 2020-05-06 (×5): 17 g via ORAL
  Filled 2020-05-01 (×6): qty 1

## 2020-05-01 NOTE — H&P (Addendum)
[place]  [phone] Fax:([phone]) [place] H&P  Reason for Admission: Cycle #1 EPOCH-R  HPI: John [name] is a 74 year old Caucasian male, with past medical history of hypertension, BPH.  He was admitted to [facility] earlier this month due to fatigue and constipation.  He was found to have severe hypercalcemia and CT scan showed a large mediastinal and liver mass with diffuse retroperitoneal lymphadenopathy.  He received IV fluids and pamidronate for treatment of hypercalcemia with resolution.  He also had hyperuricemia and was treated with rasburicase and then placed on allopurinol.  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  The patient was discharged home with a course of prednisone x5 days.    He was seen in our office yesterday to discuss biopsy results and treatment recommendations.  Fatigue and poor appetite improving.  No longer having confusion.  It was recommended him to begin inpatient EPOCH-R for treatment of his lymphoma.  The patient had a chemotherapy education class performed in our office yesterday.  Echocardiogram was also performed yesterday which showed an LVEF of 60 to 65%.  The patient was seen for admission to the hospital.  He reports that he is feeling well.  Still has some generalized fatigue but appetite improving.  Denies headaches, dizziness, mucositis.  Denies chest pain, shortness of breath, cough.  Denies abdominal pain, nausea, vomiting.  Bowels are moving without any difficulty.  Last bowel movement 11/22.  Ports lower extremity edema which has been ongoing for him.  PICC line was placed this morning by interventional radiology.  He tolerated the procedure well.  The patient is seen today for admission for cycle #1 of EPOCH-R.    Past Medical History:  Diagnosis Date  . Hypertension   . Knee pain, right    :  Past Surgical History:  Procedure Laterality Date  . FOOT SURGERY    . TONSILLECTOMY    :  No current facility-administered medications for this encounter.   Facility-Administered Medications Ordered in Other Encounters  Medication Dose Route Frequency Provider Last Rate Last Admin  . lidocaine (XYLOCAINE) 1 % (with pres) injection           . lidocaine (XYLOCAINE) 1 % (with pres) injection   Infiltration PRN Criselda Peaches, MD   10 mL at 05/01/20 5027     No Known Allergies:  Family History  Problem Relation Age of Onset  . Diabetes Mellitus II Brother   . Skin cancer Mother   . Cancer Mother        skin cancer  :  Social History   Socioeconomic History  . Marital status: Married    Spouse name: Not on file  . Number of children: 0  . Years of education: Not on file  . Highest education level: Not on file  Occupational History  . Occupation: retired Customer service manager   Tobacco Use  . Smoking status: Never Smoker  . Smokeless tobacco: Never Used  Vaping Use  . Vaping Use: Never used  Substance and Sexual Activity  . Alcohol use: No  . Drug use: No  . Sexual activity: Not on file  Other Topics Concern  . Not on file  Social History Narrative  . Not on file   Social Determinants of Health   Financial Resource Strain:   . Difficulty of Paying Living  Expenses: Not on file  Food Insecurity:   . Worried About Charity fundraiser in the Last Year: Not on file  . Ran Out of Food in the Last Year: Not on file  Transportation Needs:   . Lack of Transportation (Medical): Not on file  . Lack of Transportation (Non-Medical): Not on file  Physical Activity:   . Days of Exercise per Week: Not on file  . Minutes of Exercise per Session: Not on file  Stress:   . Feeling of Stress : Not on file  Social Connections:   . Frequency of Communication with Friends and Family: Not on file  . Frequency of Social Gatherings with Friends and Family: Not on file   . Attends Religious Services: Not on file  . Active Member of Clubs or Organizations: Not on file  . Attends Archivist Meetings: Not on file  . Marital Status: Not on file  Intimate Partner Violence:   . Fear of Current or Ex-Partner: Not on file  . Emotionally Abused: Not on file  . Physically Abused: Not on file  . Sexually Abused: Not on file  :  Review of Systems: A comprehensive 14 point review of systems was negative except as noted in the HPI.  Exam: No data found.  General:  well-nourished in no acute distress.   Eyes:  no scleral icterus.   ENT:  There were no oropharyngeal lesions.     Respiratory: lungs were clear bilaterally without wheezing or crackles.   Cardiovascular:  Regular rate and rhythm, S1/S2, without murmur, rub or gallop.  Trace pedal edema bilaterally. GI:  abdomen was soft, flat, nontender, nondistended, without organomegaly.   Musculoskeletal: Strength symmetrical in the upper and lower extremities. Skin exam was without echymosis, petichae.   Neuro exam was nonfocal. Patient was alert and oriented.  Attention was good.   Language was appropriate.  Mood was normal without depression.  Speech was not pressured.  Thought content was not tangential.     Lab Results  Component Value Date   WBC 6.7 04/30/2020   HGB 9.4 (L) 04/30/2020   HCT 27.9 (L) 04/30/2020   PLT 184 04/30/2020   GLUCOSE 106 (H) 04/30/2020   ALT 76 (H) 04/30/2020   AST 55 (H) 04/30/2020   NA 138 04/30/2020   K 4.3 04/30/2020   CL 104 04/30/2020   CREATININE 1.75 (H) 04/30/2020   BUN 40 (H) 04/30/2020   CO2 24 04/30/2020    CT ABDOMEN PELVIS WO CONTRAST  Result Date: 04/25/2020 CLINICAL DATA:  74 year old with recent biopsy of hepatic mass on 04/23/2020. Abdominal pain with down trending hemoglobin level. EXAM: CT ABDOMEN AND PELVIS WITHOUT CONTRAST TECHNIQUE: Multidetector CT imaging of the abdomen and pelvis was performed following the standard protocol without IV  contrast. COMPARISON:  CT abdomen and pelvis 04/20/2020 FINDINGS: Lower chest: New small bilateral pleural effusions. Hepatobiliary: Again noted is a poorly defined central right hepatic lesion that measures at least 7.4 cm. This lesion was recently biopsied. There is new perihepatic fluid along the right hepatic lobe. No acute abnormality to the liver. Pancreas: There is a mass or lymphadenopathy near the pancreatic head and similar to the recent comparison examination. Concern for nodularity near the pancreatic tail region which is similar to the recent comparison examination. Spleen: Poorly defined low-density lesion in the anterior inferior aspect of the spleen and concerning for a splenic lesion. Adrenals/Urinary Tract: Small nodule associated with the right adrenal gland. Mild fullness left  adrenal gland. Probable cyst in left kidney upper pole. Negative for hydronephrosis. Fluid in the urinary bladder. Stomach/Bowel: Normal appearance of stomach. No evidence for bowel obstruction focal bowel inflammation. Vascular/Lymphatic: Abdominal aorta measures up to 2.8 cm with atherosclerotic calcifications. Again noted is extensive para-aortic lymphadenopathy. Enlarged lymph nodes in the left iliac nodal chain. Again noted is a large mass or nodal mass in the central abdomen near the pancreatic head that measures up to 6.3 cm. Additional large mesenteric lymph nodes in the right abdomen on sequence 3, image 49. Probable enlarged lymph nodes in gastrohepatic ligament. Splenule or nodal lesion anterior to the spleen on image 21, sequence 3. Reproductive: Prostate is enlarged. Other: There is slightly dense ascites in the lower abdomen and pelvis. Findings are most compatible with hemoperitoneum from recent liver biopsy. Small amount of high-density fluid in the left lateral lower quadrant. Small amount of fluid around the liver. The most dense ascites measures roughly 26 Hounsfield units. Small amount of high-density  fluid or blood extending into the right inguinal canal. Negative for free air. Musculoskeletal: Severe anterolisthesis at L4-L5. Multilevel degenerative changes in the lumbar spine. IMPRESSION: 1. Development of high-density ascites in the abdomen and pelvis. Findings are most compatible with a small to moderate amount of hemoperitoneum and related to the recent liver biopsy. 2. Extensive lymphadenopathy throughout the abdomen and pelvis with a large mass or nodal mass near the pancreatic head. There are additional lesions involving the liver and spleen. Findings are suggestive for metastatic disease. 3. Development of small bilateral pleural effusions. These results were called by telephone at the time of interpretation on 04/25/2020 at 1:43 pm to provider ERIC [place] who verbally acknowledged these results. Electronically Signed   By: Markus Daft M.D.   On: 04/25/2020 13:50   CT ABDOMEN PELVIS WO CONTRAST  Result Date: 04/20/2020 CLINICAL DATA:  74 year old male with nausea vomiting. Epigastric pain. EXAM: CT CHEST, ABDOMEN AND PELVIS WITHOUT CONTRAST TECHNIQUE: Multidetector CT imaging of the chest, abdomen and pelvis was performed following the standard protocol without IV contrast. COMPARISON:  None. FINDINGS: Evaluation of this exam is limited in the absence of intravenous contrast. CT CHEST FINDINGS Cardiovascular: There is no cardiomegaly or pericardial effusion. Coronary vascular calcification. There is moderate atherosclerotic calcification of the thoracic aorta. The central pulmonary arteries are grossly unremarkable on this noncontrast CT. Mediastinum/Nodes: Large anterior mediastinal/prevascular space mass/adenopathy abutting the anterior aspect of the aortic arch measuring 6.3 x 4.0 cm in greatest axial dimensions. There is superior extension of the mass into the upper mediastinum. Additional 2.7 x 2.2 cm nodular density in the region of the left hilum may represent portion of the mass or new  adenopathy. The esophagus is grossly unremarkable. No mediastinal fluid collection. Lungs/Pleura: The lungs are clear. There is no pleural effusion pneumothorax. The central airways are patent. Musculoskeletal: Degenerative changes of the spine. No acute osseous pathology. CT ABDOMEN PELVIS FINDINGS No intra-abdominal free air or free fluid. Hepatobiliary: There is an 8.6 x 8.2 cm low attenuating mass in the right lobe of the liver which is not characterized on this CT. Further characterization with MRI without and with contrast on a nonemergent/outpatient basis recommended. No intrahepatic biliary ductal dilatation. Probable sludge within the gallbladder. No calcified stone or pericholecystic fluid. Pancreas: No acute inflammation. No pancreatic atrophy or dilatation of the main pancreatic duct Spleen: Indeterminate and ill-defined hypodense lesions within the spleen. There is slight irregularity of the splenic contour. Adrenals/Urinary Tract: Mild thickening of the  adrenal glands. There is no hydronephrosis or nephrolithiasis on either side. A 15 mm left renal upper pole hypodense lesion, likely a cyst. The visualized ureters and urinary bladder appear unremarkable. Stomach/Bowel: There is sigmoid diverticulosis without active inflammatory changes. There is no bowel obstruction or active inflammation. The appendix is normal. Vascular/Lymphatic: Advanced aortoiliac atherosclerotic disease. The IVC is unremarkable. No portal venous gas. Extensive mesenteric and retroperitoneal adenopathy. There is a 6.3 x 6.9 cm soft tissue mass in the mesentery abutting the inferior aspect of the uncinate process of the pancreas. Additional soft tissue mass/adenopathy medial to the ascending colon. Reproductive: Mild prostate enlargement. Other: None Musculoskeletal: Degenerative changes of the spine. 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 No acute osseous pathology. IMPRESSION: 1. Large anterior  mediastinal/prevascular space mass/adenopathy. The mass extends superiorly and abuts the upper trachea. 2. Large hypodense lesion in the right lobe of the liver as well as ill-defined splenic hypodense lesions. Further characterization with MRI without and with contrast recommended. The liver lesion is amenable to percutaneous tissue sampling. 3. Extensive mesenteric and retroperitoneal adenopathy. 4. Sigmoid diverticulosis. No bowel obstruction. Normal appendix. 5. Aortic Atherosclerosis (ICD10-I70.0). Electronically Signed   By: Anner Crete M.D.   On: 04/20/2020 21:11   CT Chest Wo Contrast  Result Date: 04/20/2020 CLINICAL DATA:  74 year old male with nausea vomiting. Epigastric pain. EXAM: CT CHEST, ABDOMEN AND PELVIS WITHOUT CONTRAST TECHNIQUE: Multidetector CT imaging of the chest, abdomen and pelvis was performed following the standard protocol without IV contrast. COMPARISON:  None. FINDINGS: Evaluation of this exam is limited in the absence of intravenous contrast. CT CHEST FINDINGS Cardiovascular: There is no cardiomegaly or pericardial effusion. Coronary vascular calcification. There is moderate atherosclerotic calcification of the thoracic aorta. The central pulmonary arteries are grossly unremarkable on this noncontrast CT. Mediastinum/Nodes: Large anterior mediastinal/prevascular space mass/adenopathy abutting the anterior aspect of the aortic arch measuring 6.3 x 4.0 cm in greatest axial dimensions. There is superior extension of the mass into the upper mediastinum. Additional 2.7 x 2.2 cm nodular density in the region of the left hilum may represent portion of the mass or new adenopathy. The esophagus is grossly unremarkable. No mediastinal fluid collection. Lungs/Pleura: The lungs are clear. There is no pleural effusion pneumothorax. The central airways are patent. Musculoskeletal: Degenerative changes of the spine. No acute osseous pathology. CT ABDOMEN PELVIS FINDINGS No intra-abdominal  free air or free fluid. Hepatobiliary: There is an 8.6 x 8.2 cm low attenuating mass in the right lobe of the liver which is not characterized on this CT. Further characterization with MRI without and with contrast on a nonemergent/outpatient basis recommended. No intrahepatic biliary ductal dilatation. Probable sludge within the gallbladder. No calcified stone or pericholecystic fluid. Pancreas: No acute inflammation. No pancreatic atrophy or dilatation of the main pancreatic duct Spleen: Indeterminate and ill-defined hypodense lesions within the spleen. There is slight irregularity of the splenic contour. Adrenals/Urinary Tract: Mild thickening of the adrenal glands. There is no hydronephrosis or nephrolithiasis on either side. A 15 mm left renal upper pole hypodense lesion, likely a cyst. The visualized ureters and urinary bladder appear unremarkable. Stomach/Bowel: There is sigmoid diverticulosis without active inflammatory changes. There is no bowel obstruction or active inflammation. The appendix is normal. Vascular/Lymphatic: Advanced aortoiliac atherosclerotic disease. The IVC is unremarkable. No portal venous gas. Extensive mesenteric and retroperitoneal adenopathy. There is a 6.3 x 6.9 cm soft tissue mass in the mesentery abutting the inferior aspect of the uncinate process of the pancreas. Additional  soft tissue mass/adenopathy medial to the ascending colon. Reproductive: Mild prostate enlargement. Other: None Musculoskeletal: Degenerative changes of the spine. 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 No acute osseous pathology. IMPRESSION: 1. Large anterior mediastinal/prevascular space mass/adenopathy. The mass extends superiorly and abuts the upper trachea. 2. Large hypodense lesion in the right lobe of the liver as well as ill-defined splenic hypodense lesions. Further characterization with MRI without and with contrast recommended. The liver lesion is amenable to percutaneous  tissue sampling. 3. Extensive mesenteric and retroperitoneal adenopathy. 4. Sigmoid diverticulosis. No bowel obstruction. Normal appendix. 5. Aortic Atherosclerosis (ICD10-I70.0). Electronically Signed   By: Anner Crete M.D.   On: 04/20/2020 21:11   US RENAL  Result Date: 04/22/2020 CLINICAL DATA:  Acute renal failure. EXAM: RENAL / URINARY TRACT ULTRASOUND COMPLETE COMPARISON:  CT 04/20/2020 FINDINGS: Right Kidney: Renal measurements: 12.7 x 5.4 x 5.5 cm = volume: 195 mL. Echogenicity within normal limits. No mass or hydronephrosis visualized. Left Kidney: Renal measurements: 12.9 x 5.9 x 5.0 cm = volume: 198 mL. Echogenicity within normal limits. No hydronephrosis visualized. Benign-appearing cyst measures 2.4 cm in the upper pole of the left kidney. Bladder: Appears normal for degree of bladder distention. Prevoid volume is 71 cc. Other: None. IMPRESSION: 1. 2.4 cm benign-appearing left renal cyst. 2. Otherwise normal appearance of the kidneys and bladder. Electronically Signed   By: Fidela Salisbury M.D.   On: 04/22/2020 10:30   US BIOPSY (LIVER)  Result Date: 04/23/2020 INDICATION: 74 year old male with indeterminate right lobe up attic mass. EXAM: ULTRASOUND GUIDED LIVER LESION BIOPSY COMPARISON:  None. MEDICATIONS: None ANESTHESIA/SEDATION: Fentanyl 75 mcg IV; Versed 1.5 mg IV Total Moderate Sedation time:  13 minutes. The patient's level of consciousness and vital signs were monitored continuously by radiology nursing throughout the procedure under my direct supervision. COMPLICATIONS: None immediate. PROCEDURE: Informed written consent was obtained from the patient after a discussion of the risks, benefits and alternatives to treatment. The patient understands and consents the procedure. A timeout was performed prior to the initiation of the procedure. Ultrasound scanning was performed of the right upper abdominal quadrant demonstrates heterogeneously hypoechoic well-defined mass in the  right lobe measuring up to approximately 9 cm. The right lobe mass was selected for biopsy and the procedure was planned. The right upper abdominal quadrant was prepped and draped in the usual sterile fashion. The overlying soft tissues were anesthetized with 1% lidocaine with epinephrine. A 17 gauge, 6.8 cm co-axial needle was advanced into a peripheral aspect of the lesion. This was followed by 3 core biopsies with an 18 gauge core device under direct ultrasound guidance. The coaxial needle tract was embolized with a small amount of Gel-Foam slurry and superficial hemostasis was obtained with manual compression. Post procedural scanning was negative for definitive area of hemorrhage or additional complication. A dressing was placed. The patient tolerated the procedure well without immediate post procedural complication. IMPRESSION: Technically successful ultrasound guided core needle biopsy of right lobe hepatic mass. Ruthann Cancer, MD [facility] Rad[facility] Electronically Signed   By: Ruthann Cancer MD   On: 04/23/2020 17:40   DG Chest Port 1 View  Result Date: 04/20/2020 CLINICAL DATA:  Shortness of breath.  Constipation.  Weakness. EXAM: PORTABLE CHEST 1 VIEW COMPARISON:  06/09/2018 FINDINGS: Altered mediastinal contour compared to the prior exam, I cannot exclude aortic aneurysm, mediastinal mass, or hematoma. CT of the chest (with contrast if feasible) is recommended for further workup. The lungs  appear clear.  Thoracic spondylosis noted. IMPRESSION: 1. Abnormal mediastinal contour, substantially changed from 06/09/2018. I cannot exclude aortic aneurysm, mediastinal mass, or hematoma. CT of the chest (with contrast if feasible) is recommended for further workup. 2. Thoracic spondylosis. Electronically Signed   By: Van Clines M.D.   On: 04/20/2020 18:48   ECHOCARDIOGRAM COMPLETE  Result Date: 04/30/2020    ECHOCARDIOGRAM REPORT   Patient Name:    John [name] Date of Exam: 04/30/2020 Medical Rec #:  [id number]       Height:       71.0 in Accession #:    [id number]      Weight:       222.7 lb Date of Birth:  Oct 12, 1945       BSA:          2.207 m Patient Age:    4 years        BP:           165/78 mmHg Patient Gender: M               HR:           85 bpm. Exam Location:  Inpatient Procedure: 2D Echo, Color Doppler, Cardiac Doppler, Strain Analysis and 3D Echo Indications:    Pre-Chemo Evaluation v67.2  History:        Patient has prior history of Echocardiogram examinations, most                 recent 06/23/2018. Risk Factors:Hypertension.  Sonographer:    Raquel Sarna Senior RDCS Referring Phys: [id number] [place]  1. Left ventricular ejection fraction, by estimation, is 60 to 65%. The left ventricle has normal function. The left ventricle has no regional wall motion abnormalities. There is moderate left ventricular hypertrophy of the basal-septal segment. Left ventricular diastolic parameters are consistent with Grade I diastolic dysfunction (impaired relaxation). Elevated left ventricular end-diastolic pressure. The average left ventricular global longitudinal strain is -18.4 %. The global longitudinal strain  is normal.  2. Right ventricular systolic function is normal. The right ventricular size is normal. There is normal pulmonary artery systolic pressure. The estimated right ventricular systolic pressure is 09.2 mmHg.  3. Left atrial size was severely dilated.  4. The mitral valve is normal in structure. Trivial mitral valve regurgitation. No evidence of mitral stenosis.  5. The aortic valve is normal in structure. Aortic valve regurgitation is not visualized. No aortic stenosis is present.  6. Aortic dilatation noted. There is borderline dilatation of the aortic root, measuring 37 mm.  7. The inferior vena cava is dilated in size with >50% respiratory variability, suggesting right atrial pressure of 8 mmHg. FINDINGS  Left Ventricle: Left  ventricular ejection fraction, by estimation, is 60 to 65%. The left ventricle has normal function. The left ventricle has no regional wall motion abnormalities. The average left ventricular global longitudinal strain is -18.4 %. The global longitudinal strain is normal. The left ventricular internal cavity size was normal in size. There is moderate left ventricular hypertrophy of the basal-septal segment. Left ventricular diastolic parameters are consistent with Grade I diastolic dysfunction (impaired relaxation). Elevated left ventricular end-diastolic pressure. Right Ventricle: The right ventricular size is normal. No increase in right ventricular wall thickness. Right ventricular systolic function is normal. There is normal pulmonary artery systolic pressure. The tricuspid regurgitant velocity is 2.62 m/s, and  with an assumed right atrial pressure of 8 mmHg, the estimated right ventricular systolic pressure is 33.0 mmHg. Left  Atrium: Left atrial size was severely dilated. Right Atrium: Right atrial size was normal in size. Pericardium: There is no evidence of pericardial effusion. Mitral Valve: The mitral valve is normal in structure. There is mild calcification of the anterior mitral valve leaflet(s). Mild mitral annular calcification. Trivial mitral valve regurgitation. No evidence of mitral valve stenosis. Tricuspid Valve: The tricuspid valve is normal in structure. Tricuspid valve regurgitation is trivial. No evidence of tricuspid stenosis. Aortic Valve: The aortic valve is normal in structure. Aortic valve regurgitation is not visualized. No aortic stenosis is present. Pulmonic Valve: The pulmonic valve was normal in structure. Pulmonic valve regurgitation is not visualized. No evidence of pulmonic stenosis. Aorta: Aortic dilatation noted. There is borderline dilatation of the aortic root, measuring 37 mm. Venous: The inferior vena cava is dilated in size with greater than 50% respiratory variability,  suggesting right atrial pressure of 8 mmHg. IAS/Shunts: The interatrial septum appears to be lipomatous. No atrial level shunt detected by color flow Doppler.  LEFT VENTRICLE PLAX 2D LVIDd:         3.90 cm  Diastology LVIDs:         2.60 cm  LV e' medial:    7.40 cm/s LV PW:         1.13 cm  LV E/e' medial:  13.2 LV IVS:        1.60 cm  LV e' lateral:   5.11 cm/s LVOT diam:     1.90 cm  LV E/e' lateral: 19.1 LV SV:         80 LV SV Index:   36       2D Longitudinal Strain LVOT Area:     2.84 cm 2D Strain GLS (A2C):   -17.7 %                         2D Strain GLS (A3C):   -19.8 %                         2D Strain GLS (A4C):   -17.6 %                         2D Strain GLS Avg:     -18.4 %                          3D Volume EF:                         3D EF:        59 %                         LV EDV:       204 ml                         LV ESV:       83 ml                         LV SV:        121 ml RIGHT VENTRICLE RV S prime:     15.80 cm/s TAPSE (M-mode): 2.6 cm LEFT ATRIUM              Index  RIGHT ATRIUM           Index LA diam:        4.40 cm  1.99 cm/m  RA Area:     19.70 cm LA Vol (A2C):   124.0 ml 56.19 ml/m RA Volume:   53.90 ml  24.42 ml/m LA Vol (A4C):   92.1 ml  41.73 ml/m LA Biplane Vol: 108.0 ml 48.94 ml/m  AORTIC VALVE LVOT Vmax:   134.00 cm/s LVOT Vmean:  85.400 cm/s LVOT VTI:    0.282 m  AORTA Ao Root diam: 3.70 cm Ao Asc diam:  3.30 cm MITRAL VALVE                TRICUSPID VALVE MV Area (PHT): 2.69 cm     TR Peak grad:   27.5 mmHg MV Decel Time: 282 msec     TR Vmax:        262.00 cm/s MV E velocity: 97.70 cm/s MV A velocity: 113.00 cm/s  SHUNTS MV E/A ratio:  0.86         Systemic VTI:  0.28 m                             Systemic Diam: 1.90 cm Fransico Him MD Electronically signed by Fransico Him MD Signature Date/Time: 04/30/2020/2:29:48 PM    Final      CT ABDOMEN PELVIS WO CONTRAST  Result Date: 04/25/2020 CLINICAL DATA:  75 year old with recent biopsy of hepatic mass on  04/23/2020. Abdominal pain with down trending hemoglobin level. EXAM: CT ABDOMEN AND PELVIS WITHOUT CONTRAST TECHNIQUE: Multidetector CT imaging of the abdomen and pelvis was performed following the standard protocol without IV contrast. COMPARISON:  CT abdomen and pelvis 04/20/2020 FINDINGS: Lower chest: New small bilateral pleural effusions. Hepatobiliary: Again noted is a poorly defined central right hepatic lesion that measures at least 7.4 cm. This lesion was recently biopsied. There is new perihepatic fluid along the right hepatic lobe. No acute abnormality to the liver. Pancreas: There is a mass or lymphadenopathy near the pancreatic head and similar to the recent comparison examination. Concern for nodularity near the pancreatic tail region which is similar to the recent comparison examination. Spleen: Poorly defined low-density lesion in the anterior inferior aspect of the spleen and concerning for a splenic lesion. Adrenals/Urinary Tract: Small nodule associated with the right adrenal gland. Mild fullness left adrenal gland. Probable cyst in left kidney upper pole. Negative for hydronephrosis. Fluid in the urinary bladder. Stomach/Bowel: Normal appearance of stomach. No evidence for bowel obstruction focal bowel inflammation. Vascular/Lymphatic: Abdominal aorta measures up to 2.8 cm with atherosclerotic calcifications. Again noted is extensive para-aortic lymphadenopathy. Enlarged lymph nodes in the left iliac nodal chain. Again noted is a large mass or nodal mass in the central abdomen near the pancreatic head that measures up to 6.3 cm. Additional large mesenteric lymph nodes in the right abdomen on sequence 3, image 49. Probable enlarged lymph nodes in gastrohepatic ligament. Splenule or nodal lesion anterior to the spleen on image 21, sequence 3. Reproductive: Prostate is enlarged. Other: There is slightly dense ascites in the lower abdomen and pelvis. Findings are most compatible with hemoperitoneum  from recent liver biopsy. Small amount of high-density fluid in the left lateral lower quadrant. Small amount of fluid around the liver. The most dense ascites measures roughly 26 Hounsfield units. Small amount of high-density fluid or blood extending into the right inguinal canal. Negative for free  air. Musculoskeletal: Severe anterolisthesis at L4-L5. Multilevel degenerative changes in the lumbar spine. IMPRESSION: 1. Development of high-density ascites in the abdomen and pelvis. Findings are most compatible with a small to moderate amount of hemoperitoneum and related to the recent liver biopsy. 2. Extensive lymphadenopathy throughout the abdomen and pelvis with a large mass or nodal mass near the pancreatic head. There are additional lesions involving the liver and spleen. Findings are suggestive for metastatic disease. 3. Development of small bilateral pleural effusions. These results were called by telephone at the time of interpretation on 04/25/2020 at 1:43 pm to provider ERIC [place] who verbally acknowledged these results. Electronically Signed   By: Markus Daft M.D.   On: 04/25/2020 13:50   CT ABDOMEN PELVIS WO CONTRAST  Result Date: 04/20/2020 CLINICAL DATA:  74 year old male with nausea vomiting. Epigastric pain. EXAM: CT CHEST, ABDOMEN AND PELVIS WITHOUT CONTRAST TECHNIQUE: Multidetector CT imaging of the chest, abdomen and pelvis was performed following the standard protocol without IV contrast. COMPARISON:  None. FINDINGS: Evaluation of this exam is limited in the absence of intravenous contrast. CT CHEST FINDINGS Cardiovascular: There is no cardiomegaly or pericardial effusion. Coronary vascular calcification. There is moderate atherosclerotic calcification of the thoracic aorta. The central pulmonary arteries are grossly unremarkable on this noncontrast CT. Mediastinum/Nodes: Large anterior mediastinal/prevascular space mass/adenopathy abutting the anterior aspect of the aortic arch measuring  6.3 x 4.0 cm in greatest axial dimensions. There is superior extension of the mass into the upper mediastinum. Additional 2.7 x 2.2 cm nodular density in the region of the left hilum may represent portion of the mass or new adenopathy. The esophagus is grossly unremarkable. No mediastinal fluid collection. Lungs/Pleura: The lungs are clear. There is no pleural effusion pneumothorax. The central airways are patent. Musculoskeletal: Degenerative changes of the spine. No acute osseous pathology. CT ABDOMEN PELVIS FINDINGS No intra-abdominal free air or free fluid. Hepatobiliary: There is an 8.6 x 8.2 cm low attenuating mass in the right lobe of the liver which is not characterized on this CT. Further characterization with MRI without and with contrast on a nonemergent/outpatient basis recommended. No intrahepatic biliary ductal dilatation. Probable sludge within the gallbladder. No calcified stone or pericholecystic fluid. Pancreas: No acute inflammation. No pancreatic atrophy or dilatation of the main pancreatic duct Spleen: Indeterminate and ill-defined hypodense lesions within the spleen. There is slight irregularity of the splenic contour. Adrenals/Urinary Tract: Mild thickening of the adrenal glands. There is no hydronephrosis or nephrolithiasis on either side. A 15 mm left renal upper pole hypodense lesion, likely a cyst. The visualized ureters and urinary bladder appear unremarkable. Stomach/Bowel: There is sigmoid diverticulosis without active inflammatory changes. There is no bowel obstruction or active inflammation. The appendix is normal. Vascular/Lymphatic: Advanced aortoiliac atherosclerotic disease. The IVC is unremarkable. No portal venous gas. Extensive mesenteric and retroperitoneal adenopathy. There is a 6.3 x 6.9 cm soft tissue mass in the mesentery abutting the inferior aspect of the uncinate process of the pancreas. Additional soft tissue mass/adenopathy medial to the ascending colon. Reproductive:  Mild prostate enlargement. Other: None Musculoskeletal: Degenerative changes of the spine. 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 No acute osseous pathology. IMPRESSION: 1. Large anterior mediastinal/prevascular space mass/adenopathy. The mass extends superiorly and abuts the upper trachea. 2. Large hypodense lesion in the right lobe of the liver as well as ill-defined splenic hypodense lesions. Further characterization with MRI without and with contrast recommended. The liver lesion is amenable to percutaneous tissue sampling. 3. Extensive  mesenteric and retroperitoneal adenopathy. 4. Sigmoid diverticulosis. No bowel obstruction. Normal appendix. 5. Aortic Atherosclerosis (ICD10-I70.0). Electronically Signed   By: Anner Crete M.D.   On: 04/20/2020 21:11   CT Chest Wo Contrast  Result Date: 04/20/2020 CLINICAL DATA:  74 year old male with nausea vomiting. Epigastric pain. EXAM: CT CHEST, ABDOMEN AND PELVIS WITHOUT CONTRAST TECHNIQUE: Multidetector CT imaging of the chest, abdomen and pelvis was performed following the standard protocol without IV contrast. COMPARISON:  None. FINDINGS: Evaluation of this exam is limited in the absence of intravenous contrast. CT CHEST FINDINGS Cardiovascular: There is no cardiomegaly or pericardial effusion. Coronary vascular calcification. There is moderate atherosclerotic calcification of the thoracic aorta. The central pulmonary arteries are grossly unremarkable on this noncontrast CT. Mediastinum/Nodes: Large anterior mediastinal/prevascular space mass/adenopathy abutting the anterior aspect of the aortic arch measuring 6.3 x 4.0 cm in greatest axial dimensions. There is superior extension of the mass into the upper mediastinum. Additional 2.7 x 2.2 cm nodular density in the region of the left hilum may represent portion of the mass or new adenopathy. The esophagus is grossly unremarkable. No mediastinal fluid collection. Lungs/Pleura: The lungs  are clear. There is no pleural effusion pneumothorax. The central airways are patent. Musculoskeletal: Degenerative changes of the spine. No acute osseous pathology. CT ABDOMEN PELVIS FINDINGS No intra-abdominal free air or free fluid. Hepatobiliary: There is an 8.6 x 8.2 cm low attenuating mass in the right lobe of the liver which is not characterized on this CT. Further characterization with MRI without and with contrast on a nonemergent/outpatient basis recommended. No intrahepatic biliary ductal dilatation. Probable sludge within the gallbladder. No calcified stone or pericholecystic fluid. Pancreas: No acute inflammation. No pancreatic atrophy or dilatation of the main pancreatic duct Spleen: Indeterminate and ill-defined hypodense lesions within the spleen. There is slight irregularity of the splenic contour. Adrenals/Urinary Tract: Mild thickening of the adrenal glands. There is no hydronephrosis or nephrolithiasis on either side. A 15 mm left renal upper pole hypodense lesion, likely a cyst. The visualized ureters and urinary bladder appear unremarkable. Stomach/Bowel: There is sigmoid diverticulosis without active inflammatory changes. There is no bowel obstruction or active inflammation. The appendix is normal. Vascular/Lymphatic: Advanced aortoiliac atherosclerotic disease. The IVC is unremarkable. No portal venous gas. Extensive mesenteric and retroperitoneal adenopathy. There is a 6.3 x 6.9 cm soft tissue mass in the mesentery abutting the inferior aspect of the uncinate process of the pancreas. Additional soft tissue mass/adenopathy medial to the ascending colon. Reproductive: Mild prostate enlargement. Other: None Musculoskeletal: Degenerative changes of the spine. 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 No acute osseous pathology. IMPRESSION: 1. Large anterior mediastinal/prevascular space mass/adenopathy. The mass extends superiorly and abuts the upper trachea. 2. Large  hypodense lesion in the right lobe of the liver as well as ill-defined splenic hypodense lesions. Further characterization with MRI without and with contrast recommended. The liver lesion is amenable to percutaneous tissue sampling. 3. Extensive mesenteric and retroperitoneal adenopathy. 4. Sigmoid diverticulosis. No bowel obstruction. Normal appendix. 5. Aortic Atherosclerosis (ICD10-I70.0). Electronically Signed   By: Anner Crete M.D.   On: 04/20/2020 21:11   US RENAL  Result Date: 04/22/2020 CLINICAL DATA:  Acute renal failure. EXAM: RENAL / URINARY TRACT ULTRASOUND COMPLETE COMPARISON:  CT 04/20/2020 FINDINGS: Right Kidney: Renal measurements: 12.7 x 5.4 x 5.5 cm = volume: 195 mL. Echogenicity within normal limits. No mass or hydronephrosis visualized. Left Kidney: Renal measurements: 12.9 x 5.9 x 5.0 cm = volume: 198 mL. Echogenicity within  normal limits. No hydronephrosis visualized. Benign-appearing cyst measures 2.4 cm in the upper pole of the left kidney. Bladder: Appears normal for degree of bladder distention. Prevoid volume is 71 cc. Other: None. IMPRESSION: 1. 2.4 cm benign-appearing left renal cyst. 2. Otherwise normal appearance of the kidneys and bladder. Electronically Signed   By: Fidela Salisbury M.D.   On: 04/22/2020 10:30   US BIOPSY (LIVER)  Result Date: 04/23/2020 INDICATION: 74 year old male with indeterminate right lobe up attic mass. EXAM: ULTRASOUND GUIDED LIVER LESION BIOPSY COMPARISON:  None. MEDICATIONS: None ANESTHESIA/SEDATION: Fentanyl 75 mcg IV; Versed 1.5 mg IV Total Moderate Sedation time:  13 minutes. The patient's level of consciousness and vital signs were monitored continuously by radiology nursing throughout the procedure under my direct supervision. COMPLICATIONS: None immediate. PROCEDURE: Informed written consent was obtained from the patient after a discussion of the risks, benefits and alternatives to treatment. The patient understands and consents the  procedure. A timeout was performed prior to the initiation of the procedure. Ultrasound scanning was performed of the right upper abdominal quadrant demonstrates heterogeneously hypoechoic well-defined mass in the right lobe measuring up to approximately 9 cm. The right lobe mass was selected for biopsy and the procedure was planned. The right upper abdominal quadrant was prepped and draped in the usual sterile fashion. The overlying soft tissues were anesthetized with 1% lidocaine with epinephrine. A 17 gauge, 6.8 cm co-axial needle was advanced into a peripheral aspect of the lesion. This was followed by 3 core biopsies with an 18 gauge core device under direct ultrasound guidance. The coaxial needle tract was embolized with a small amount of Gel-Foam slurry and superficial hemostasis was obtained with manual compression. Post procedural scanning was negative for definitive area of hemorrhage or additional complication. A dressing was placed. The patient tolerated the procedure well without immediate post procedural complication. IMPRESSION: Technically successful ultrasound guided core needle biopsy of right lobe hepatic mass. Ruthann Cancer, MD [facility] Electronically Signed   By: Ruthann Cancer MD   On: 04/23/2020 17:40   DG Chest Port 1 View  Result Date: 04/20/2020 CLINICAL DATA:  Shortness of breath.  Constipation.  Weakness. EXAM: PORTABLE CHEST 1 VIEW COMPARISON:  06/09/2018 FINDINGS: Altered mediastinal contour compared to the prior exam, I cannot exclude aortic aneurysm, mediastinal mass, or hematoma. CT of the chest (with contrast if feasible) is recommended for further workup. The lungs appear clear.  Thoracic spondylosis noted. IMPRESSION: 1. Abnormal mediastinal contour, substantially changed from 06/09/2018. I cannot exclude aortic aneurysm, mediastinal mass, or hematoma. CT of the chest (with contrast if feasible) is recommended for  further workup. 2. Thoracic spondylosis. Electronically Signed   By: Van Clines M.D.   On: 04/20/2020 18:48   ECHOCARDIOGRAM COMPLETE  Result Date: 04/30/2020    ECHOCARDIOGRAM REPORT   Patient Name:   John [name] Date of Exam: 04/30/2020 Medical Rec #:  [id number]       Height:       71.0 in Accession #:    [id number]      Weight:       222.7 lb Date of Birth:  1945/11/12       BSA:          2.207 m Patient Age:    10 years        BP:           165/78 mmHg Patient Gender: M  HR:           85 bpm. Exam Location:  Inpatient Procedure: 2D Echo, Color Doppler, Cardiac Doppler, Strain Analysis and 3D Echo Indications:    Pre-Chemo Evaluation v67.2  History:        Patient has prior history of Echocardiogram examinations, most                 recent 06/23/2018. Risk Factors:Hypertension.  Sonographer:    Raquel Sarna Senior RDCS Referring Phys: [id number] [place]  1. Left ventricular ejection fraction, by estimation, is 60 to 65%. The left ventricle has normal function. The left ventricle has no regional wall motion abnormalities. There is moderate left ventricular hypertrophy of the basal-septal segment. Left ventricular diastolic parameters are consistent with Grade I diastolic dysfunction (impaired relaxation). Elevated left ventricular end-diastolic pressure. The average left ventricular global longitudinal strain is -18.4 %. The global longitudinal strain  is normal.  2. Right ventricular systolic function is normal. The right ventricular size is normal. There is normal pulmonary artery systolic pressure. The estimated right ventricular systolic pressure is 38.3 mmHg.  3. Left atrial size was severely dilated.  4. The mitral valve is normal in structure. Trivial mitral valve regurgitation. No evidence of mitral stenosis.  5. The aortic valve is normal in structure. Aortic valve regurgitation is not visualized. No aortic stenosis is present.  6. Aortic dilatation noted. There is  borderline dilatation of the aortic root, measuring 37 mm.  7. The inferior vena cava is dilated in size with >50% respiratory variability, suggesting right atrial pressure of 8 mmHg. FINDINGS  Left Ventricle: Left ventricular ejection fraction, by estimation, is 60 to 65%. The left ventricle has normal function. The left ventricle has no regional wall motion abnormalities. The average left ventricular global longitudinal strain is -18.4 %. The global longitudinal strain is normal. The left ventricular internal cavity size was normal in size. There is moderate left ventricular hypertrophy of the basal-septal segment. Left ventricular diastolic parameters are consistent with Grade I diastolic dysfunction (impaired relaxation). Elevated left ventricular end-diastolic pressure. Right Ventricle: The right ventricular size is normal. No increase in right ventricular wall thickness. Right ventricular systolic function is normal. There is normal pulmonary artery systolic pressure. The tricuspid regurgitant velocity is 2.62 m/s, and  with an assumed right atrial pressure of 8 mmHg, the estimated right ventricular systolic pressure is 77.9 mmHg. Left Atrium: Left atrial size was severely dilated. Right Atrium: Right atrial size was normal in size. Pericardium: There is no evidence of pericardial effusion. Mitral Valve: The mitral valve is normal in structure. There is mild calcification of the anterior mitral valve leaflet(s). Mild mitral annular calcification. Trivial mitral valve regurgitation. No evidence of mitral valve stenosis. Tricuspid Valve: The tricuspid valve is normal in structure. Tricuspid valve regurgitation is trivial. No evidence of tricuspid stenosis. Aortic Valve: The aortic valve is normal in structure. Aortic valve regurgitation is not visualized. No aortic stenosis is present. Pulmonic Valve: The pulmonic valve was normal in structure. Pulmonic valve regurgitation is not visualized. No evidence of  pulmonic stenosis. Aorta: Aortic dilatation noted. There is borderline dilatation of the aortic root, measuring 37 mm. Venous: The inferior vena cava is dilated in size with greater than 50% respiratory variability, suggesting right atrial pressure of 8 mmHg. IAS/Shunts: The interatrial septum appears to be lipomatous. No atrial level shunt detected by color flow Doppler.  LEFT VENTRICLE PLAX 2D LVIDd:         3.90 cm  Diastology LVIDs:         2.60 cm  LV e' medial:    7.40 cm/s LV PW:         1.13 cm  LV E/e' medial:  13.2 LV IVS:        1.60 cm  LV e' lateral:   5.11 cm/s LVOT diam:     1.90 cm  LV E/e' lateral: 19.1 LV SV:         80 LV SV Index:   36       2D Longitudinal Strain LVOT Area:     2.84 cm 2D Strain GLS (A2C):   -17.7 %                         2D Strain GLS (A3C):   -19.8 %                         2D Strain GLS (A4C):   -17.6 %                         2D Strain GLS Avg:     -18.4 %                          3D Volume EF:                         3D EF:        59 %                         LV EDV:       204 ml                         LV ESV:       83 ml                         LV SV:        121 ml RIGHT VENTRICLE RV S prime:     15.80 cm/s TAPSE (M-mode): 2.6 cm LEFT ATRIUM              Index       RIGHT ATRIUM           Index LA diam:        4.40 cm  1.99 cm/m  RA Area:     19.70 cm LA Vol (A2C):   124.0 ml 56.19 ml/m RA Volume:   53.90 ml  24.42 ml/m LA Vol (A4C):   92.1 ml  41.73 ml/m LA Biplane Vol: 108.0 ml 48.94 ml/m  AORTIC VALVE LVOT Vmax:   134.00 cm/s LVOT Vmean:  85.400 cm/s LVOT VTI:    0.282 m  AORTA Ao Root diam: 3.70 cm Ao Asc diam:  3.30 cm MITRAL VALVE                TRICUSPID VALVE MV Area (PHT): 2.69 cm     TR Peak grad:   27.5 mmHg MV Decel Time: 282 msec     TR Vmax:        262.00 cm/s MV E velocity: 97.70 cm/s MV A velocity: 113.00 cm/s  SHUNTS MV E/A ratio:  0.86         Systemic  VTI:  0.28 m                             Systemic Diam: 1.90 cm Fransico Him MD Electronically  signed by Fransico Him MD Signature Date/Time: 04/30/2020/2:29:48 PM    Final    Assessment and Plan:   1.  Stage IVa diffuse large B-cell lymphoma, ABC subtype 2.  Fatigue secondary #1 3.  AKI, improving 4.  Anemia secondary to intra-abdominal hemorrhage after liver biopsy 5.  High risk for tumor lysis syndrome 6.  Hypercalcemia  -Diagnosis and treatment options have been discussed with the patient and his wife.  Recommend for him to begin day 1 of cycle #1 of EPOCH-R.  Adverse effects of this treatment have been discussed with the patient and his wife including but not limited to myelosuppression, alopecia, nausea, vomiting, constipation, renal and hepatic dysfunction, and risk for infusion reaction with rituximab.  They agree to proceed.  He completed chemotherapy education class on 11/22.  LVEF adequate to proceed with treatment.  Decline has been placed. -We will monitor labs closely for signs of tumor lysis syndrome.  A daily CBC with differential, CMET, uric acid have been ordered. -He is also at high risk for CNS involvement due to high risk features and will plan to begin intrathecal methotrexate with cycle #2. -We will continue home aspirin but hold on Lovenox due to AKI and recent intra-abdominal hemorrhage from liver biopsy.  SCDs have been ordered. -Colace 100 mg twice daily and MiraLAX daily have been ordered for bowel regimen. -Compazine as needed has been ordered for nausea and vomiting. -Continue allopurinol and other home medications. -Sodium bicarbonate mouth rinses have been ordered. -We will plan for outpatient PET scan in about 2 weeks. -He will follow-up next week for G-CSF and rituximab.  John Bussing, John [name], John [name], John [name]  Addendum  Mr. Eddings was admitted this morning for initiation chemotherapy.  He has high-grade diffuse large B-cell lymphoma, ABC type.  Labs yesterday showed increasing hypercalcemia again.  He needs urgent chemotherapy, plan to start first  cycle R-EPOCH today with etoposide dose reduction.  Chemo consent obtained.  Plan to start Rituxan later this week, will order next week as outpatient, if he does not have significant tumor lysis syndrome.  He is at very high risk for tumor lysis, will monitor labs closely, watch I/O.   John [name]  05/01/2020

## 2020-05-01 NOTE — Progress Notes (Signed)
Ok to tx with labs from 04/30/20.   Dr Burr Medico will order prednisone as pt has been taking since Saturday.

## 2020-05-02 DIAGNOSIS — C8330 Diffuse large B-cell lymphoma, unspecified site: Secondary | ICD-10-CM

## 2020-05-02 LAB — COMPREHENSIVE METABOLIC PANEL
ALT: 71 U/L — ABNORMAL HIGH (ref 0–44)
AST: 52 U/L — ABNORMAL HIGH (ref 15–41)
Albumin: 3.1 g/dL — ABNORMAL LOW (ref 3.5–5.0)
Alkaline Phosphatase: 101 U/L (ref 38–126)
Anion gap: 11 (ref 5–15)
BUN: 50 mg/dL — ABNORMAL HIGH (ref 8–23)
CO2: 25 mmol/L (ref 22–32)
Calcium: 10.8 mg/dL — ABNORMAL HIGH (ref 8.9–10.3)
Chloride: 100 mmol/L (ref 98–111)
Creatinine, Ser: 1.59 mg/dL — ABNORMAL HIGH (ref 0.61–1.24)
GFR, Estimated: 46 mL/min — ABNORMAL LOW (ref >60)
Glucose, Bld: 78 mg/dL (ref 70–99)
Potassium: 4.1 mmol/L (ref 3.5–5.1)
Sodium: 136 mmol/L (ref 135–145)
Total Bilirubin: 1.2 mg/dL (ref 0.3–1.2)
Total Protein: 5.3 g/dL — ABNORMAL LOW (ref 6.5–8.1)

## 2020-05-02 LAB — CBC WITH DIFFERENTIAL/PLATELET
Abs Immature Granulocytes: 0.13 10*3/uL — ABNORMAL HIGH (ref 0.00–0.07)
Basophils Absolute: 0 10*3/uL (ref 0.0–0.1)
Basophils Relative: 0 %
Eosinophils Absolute: 0 10*3/uL (ref 0.0–0.5)
Eosinophils Relative: 0 %
HCT: 26.7 % — ABNORMAL LOW (ref 39.0–52.0)
Hemoglobin: 9.1 g/dL — ABNORMAL LOW (ref 13.0–17.0)
Immature Granulocytes: 2 %
Lymphocytes Relative: 3 %
Lymphs Abs: 0.3 10*3/uL — ABNORMAL LOW (ref 0.7–4.0)
MCH: 29.9 pg (ref 26.0–34.0)
MCHC: 34.1 g/dL (ref 30.0–36.0)
MCV: 87.8 fL (ref 80.0–100.0)
Monocytes Absolute: 0.7 10*3/uL (ref 0.1–1.0)
Monocytes Relative: 10 %
Neutro Abs: 6.5 10*3/uL (ref 1.7–7.7)
Neutrophils Relative %: 85 %
Platelets: 160 10*3/uL (ref 150–400)
RBC: 3.04 MIL/uL — ABNORMAL LOW (ref 4.22–5.81)
RDW: 14.5 % (ref 11.5–15.5)
WBC: 7.7 10*3/uL (ref 4.0–10.5)
nRBC: 0 % (ref 0.0–0.2)

## 2020-05-02 LAB — URIC ACID: Uric Acid, Serum: 6.9 mg/dL (ref 3.7–8.6)

## 2020-05-02 MED ORDER — MELATONIN 5 MG PO TABS
5.0000 mg | ORAL_TABLET | Freq: Every evening | ORAL | Status: DC | PRN
  Administered 2020-05-03: 5 mg via ORAL
  Filled 2020-05-02 (×2): qty 1

## 2020-05-02 MED ORDER — VINCRISTINE SULFATE CHEMO INJECTION 1 MG/ML
Freq: Once | INTRAVENOUS | Status: AC
  Filled 2020-05-02: qty 11

## 2020-05-02 MED ORDER — DIPHENHYDRAMINE HCL 25 MG PO CAPS
25.0000 mg | ORAL_CAPSULE | Freq: Every evening | ORAL | Status: DC | PRN
  Administered 2020-05-04: 25 mg via ORAL
  Filled 2020-05-02: qty 1

## 2020-05-02 MED ORDER — POTASSIUM CHLORIDE CRYS ER 20 MEQ PO TBCR
20.0000 meq | EXTENDED_RELEASE_TABLET | Freq: Once | ORAL | Status: AC
  Administered 2020-05-02: 20 meq via ORAL
  Filled 2020-05-02: qty 1

## 2020-05-02 MED ORDER — ADULT MULTIVITAMIN W/MINERALS CH
1.0000 | ORAL_TABLET | Freq: Every day | ORAL | Status: DC
  Administered 2020-05-02 – 2020-05-07 (×6): 1 via ORAL
  Filled 2020-05-02 (×6): qty 1

## 2020-05-02 MED ORDER — FUROSEMIDE 10 MG/ML IJ SOLN
10.0000 mg | Freq: Once | INTRAMUSCULAR | Status: AC
  Administered 2020-05-02: 10 mg via INTRAVENOUS
  Filled 2020-05-02: qty 2

## 2020-05-02 MED ORDER — HEPARIN SODIUM (PORCINE) 5000 UNIT/ML IJ SOLN
5000.0000 [IU] | Freq: Two times a day (BID) | INTRAMUSCULAR | Status: DC
  Administered 2020-05-02 – 2020-05-07 (×11): 5000 [IU] via SUBCUTANEOUS
  Filled 2020-05-02 (×12): qty 1

## 2020-05-02 MED ORDER — SODIUM CHLORIDE 0.9 % IV SOLN
Freq: Once | INTRAVENOUS | Status: AC
  Administered 2020-05-03: 18 mg via INTRAVENOUS
  Filled 2020-05-02: qty 4

## 2020-05-02 MED ORDER — SODIUM CHLORIDE 0.9 % IV SOLN
Freq: Once | INTRAVENOUS | Status: DC
  Filled 2020-05-02: qty 4

## 2020-05-02 MED ORDER — SODIUM CHLORIDE 0.9 % IV SOLN
Freq: Once | INTRAVENOUS | Status: AC
  Administered 2020-05-02: 18 mg via INTRAVENOUS
  Filled 2020-05-02: qty 4

## 2020-05-02 MED ORDER — ENSURE ENLIVE PO LIQD
237.0000 mL | Freq: Two times a day (BID) | ORAL | Status: DC
  Administered 2020-05-04 – 2020-05-06 (×3): 237 mL via ORAL

## 2020-05-02 NOTE — Progress Notes (Addendum)
John [name]   DOB:09-Jul-1945   HU#:[id number]   ZCH#:[id number]  Oncology prognosis note   Subjective: Pt tolerated first day chemo well, no N/V or other noticeable side effects. His main complain is insomnia last night, he woke up several times last night.  He otherwise doing well, has been ambulating in the hallway, no shortness of breath or other complaints.   Objective:  There were no vitals filed for this visit.  There is no height or weight on file to calculate BMI.  Intake/Output Summary (Last 24 hours) at 05/02/2020 0900 Last data filed at 05/02/2020 2878 Gross per 24 hour  Intake --  Output 550 ml  Net -550 ml     Sclerae unicteric  Oropharynx clear  No peripheral adenopathy  Lungs clear scheduler rales in b/l lung bases   Heart regular rate and rhythm  Abdomen benign  MSK no focal spinal tenderness, mild pitting edema on bilateral April extremities  Neuro nonfocal    CBG (last 3)  No results for input(s): GLUCAP in the last 72 hours.   Labs:  Urine Studies No results for input(s): UHGB, CRYS in the last 72 hours.  Invalid input(s): UACOL, UAPR, USPG, UPH, UTP, UGL, UKET, UBIL, UNIT, UROB, ULEU, UEPI, UWBC, URBC, UBAC, CAST, [place], [place]  Basic Metabolic Panel: Recent Labs  Lab 04/26/20 0146 04/26/20 0146 04/30/20 1206 05/02/20 0548  NA 139  --  138 136  K 3.7   < > 4.3 4.1  CL 111  --  104 100  CO2 21*  --  24 25  GLUCOSE 89  --  106* 78  BUN 40*  --  40* 50*  CREATININE 2.36*  --  1.75* 1.59*  CALCIUM 10.3  --  12.3* 10.8*   < > = values in this interval not displayed.   GFR Estimated Creatinine Clearance: 50.9 mL/min (A) (by C-G formula based on SCr of 1.59 mg/dL (H)). Liver Function Tests: Recent Labs  Lab 04/26/20 0146 04/30/20 1206 05/02/20 0548  AST 65* 55* 52*  ALT 47* 76* 71*  ALKPHOS 89 126 101  BILITOT 1.3* 1.0 1.2  PROT 4.4* 5.8* 5.3*  ALBUMIN 2.4* 3.2* 3.1*   No results for input(s): LIPASE, AMYLASE in the last 168 hours. No  results for input(s): AMMONIA in the last 168 hours. Coagulation profile No results for input(s): INR, PROTIME in the last 168 hours.  CBC: Recent Labs  Lab 04/25/20 1519 04/25/20 2127 04/26/20 0146 04/30/20 1206 05/02/20 0548  WBC  --   --  4.0 6.7 7.7  NEUTROABS  --   --   --  5.7 6.5  HGB 8.3* 8.1* 8.1* 9.4* 9.1*  HCT 24.7* 24.1* 23.9* 27.9* 26.7*  MCV  --   --  86.9 86.4 87.8  PLT  --   --  122* 184 160   Cardiac Enzymes: No results for input(s): CKTOTAL, CKMB, CKMBINDEX, TROPONINI in the last 168 hours. BNP: Invalid input(s): POCBNP CBG: No results for input(s): GLUCAP in the last 168 hours. D-Dimer No results for input(s): DDIMER in the last 72 hours. Hgb A1c No results for input(s): HGBA1C in the last 72 hours. Lipid Profile No results for input(s): CHOL, HDL, LDLCALC, TRIG, CHOLHDL, LDLDIRECT in the last 72 hours. Thyroid function studies No results for input(s): TSH, T4TOTAL, T3FREE, THYROIDAB in the last 72 hours.  Invalid input(s): FREET3 Anemia work up No results for input(s): VITAMINB12, FOLATE, FERRITIN, TIBC, IRON, RETICCTPCT in the last 72 hours. Microbiology Recent  Results (from the past 240 hour(s))  Resp Panel by RT-PCR (Flu A&B, Covid) Nasopharyngeal Swab     Status: None   Collection Time: 05/01/20  9:41 AM   Specimen: Nasopharyngeal Swab; Nasopharyngeal(NP) swabs in vial transport medium  Result Value Ref Range Status   SARS Coronavirus 2 by RT PCR NEGATIVE NEGATIVE Final    Comment: (NOTE) SARS-CoV-2 target nucleic acids are NOT DETECTED.  The SARS-CoV-2 RNA is generally detectable in upper respiratory specimens during the acute phase of infection. The lowest concentration of SARS-CoV-2 viral copies this assay can detect is 138 copies/mL. A negative result does not preclude SARS-Cov-2 infection and should not be used as the sole basis for treatment or other patient management decisions. A negative result may occur with  improper specimen  collection/handling, submission of specimen other than nasopharyngeal swab, presence of viral mutation(s) within the areas targeted by this assay, and inadequate number of viral copies(<138 copies/mL). A negative result must be combined with clinical observations, patient history, and epidemiological information. The expected result is Negative.  Fact Sheet for Patients:  EntrepreneurPulse.com.au  Fact Sheet for Healthcare Providers:  IncredibleEmployment.be  This test is no t yet approved or cleared by the [place] FDA and  has been authorized for detection and/or diagnosis of SARS-CoV-2 by FDA under an Emergency Use Authorization (EUA). This EUA will remain  in effect (meaning this test can be used) for the duration of the COVID-19 declaration under Section 564(b)(1) of the Act, 21 U.S.C.section 360bbb-3(b)(1), unless the authorization is terminated  or revoked sooner.       Influenza A by PCR NEGATIVE NEGATIVE Final   Influenza B by PCR NEGATIVE NEGATIVE Final    Comment: (NOTE) The Xpert Xpress SARS-CoV-2/FLU/RSV plus assay is intended as an aid in the diagnosis of influenza from Nasopharyngeal swab specimens and should not be used as a sole basis for treatment. Nasal washings and aspirates are unacceptable for Xpert Xpress SARS-CoV-2/FLU/RSV testing.  Fact Sheet for Patients: EntrepreneurPulse.com.au  Fact Sheet for Healthcare Providers: IncredibleEmployment.be  This test is not yet approved or cleared by the [place] FDA and has been authorized for detection and/or diagnosis of SARS-CoV-2 by FDA under an Emergency Use Authorization (EUA). This EUA will remain in effect (meaning this test can be used) for the duration of the COVID-19 declaration under Section 564(b)(1) of the Act, 21 U.S.C. section 360bbb-3(b)(1), unless the authorization is terminated or revoked.  Performed at [facility], [place] [address], [place],  [address]       Studies:  IR PICC PLACEMENT RIGHT >5 YRS INC IMG GUIDE  Result Date: 05/01/2020 INDICATION: 74 year old male in need of a dual lumen PICC for chemotherapy. EXAM: PICC LINE PLACEMENT WITH ULTRASOUND AND FLUOROSCOPIC GUIDANCE MEDICATIONS: None. ANESTHESIA/SEDATION: None. FLUOROSCOPY TIME:  Fluoroscopy Time: 0 minutes 6 seconds (3 mGy). COMPLICATIONS: None immediate. PROCEDURE: The patient was advised of the possible risks and complications and agreed to undergo the procedure. The patient was then brought to the angiographic suite for the procedure. The right arm was prepped with chlorhexidine, draped in the usual sterile fashion using maximum barrier technique (cap and mask, sterile gown, sterile gloves, large sterile sheet, hand hygiene and cutaneous antisepsis) and infiltrated locally with 1% Lidocaine. Ultrasound demonstrated patency of the right basilic vein, and this was documented with an image. Under real-time ultrasound guidance, this vein was accessed with a 21 gauge micropuncture needle and image documentation was performed. A 0.018 wire was introduced in to  the vein. Over this, a 6 [place] dual lumen power-injectable PICC was advanced to the lower SVC/right atrial junction. Fluoroscopy during the procedure and fluoro spot radiograph confirms appropriate catheter position. The catheter was flushed and covered with a sterile dressing. Catheter length: 40 cm IMPRESSION: Successful right arm Power PICC line placement with ultrasound and fluoroscopic guidance. The catheter is ready for use. Electronically Signed   By: Jacqulynn Cadet M.D.   On: 05/01/2020 15:56    Assessment:  John [name] is a 74 y.o. male with   1.  Diffuse large B-cell lymphoma, ABC subtype, stage IV 2.  Hypercalcemia and history of tumor lysis secondary to malignancy 3. AKI secondary to #2, improving  4. Fatigue and low appetite  5.  Anemia  secondary to lymphoma and intra-abdominal hemorrhage after liver biopsy   Plan:  -He is tolerating chemotherapy very well, lab reviewed, adequate to proceed day to treatment today -We will give IV Lasix 10 mg today for mild edema and oral potassium -daily weight -nutrition consult  -We will add on heparin subcu for DVT prophylaxis -We will add melatonin and Benadryl as needed for insomnia -May give Rituxan on Friday if no outpt infusion space early next week -My partner Dr. Alen Blew and Marin Olp will cover me for the next 3 days  -plan to discharge on Saturday after he completes chemo    Truitt Merle, MD 05/02/2020  9:00 AM  Addendum  His hospital admission was finally approved after I called and did P2P review. Plan to give first dose Rituximab on Friday 11/26. I spoke with pharmacy this morning.  It's not compatible with chemo, so will start Rituximab around 8am (per first infusion protocol), then postpone chemo until Rixutumab is completed on Friday.   Truitt Merle  05/02/2020

## 2020-05-02 NOTE — Progress Notes (Signed)
Initial Nutrition Assessment  RD working remotely.  DOCUMENTATION CODES:   Obesity unspecified  INTERVENTION:  - will order Ensure Enlive BID, each supplement provides 350 kcal and 20 grams of protein. - will order 1 tablet multivitamin with minerals/day. - will complete NFPE at follow-up.    NUTRITION DIAGNOSIS:   Increased nutrient needs related to acute illness, catabolic illness, cancer and cancer related treatments as evidenced by estimated needs.  GOAL:   Patient will meet greater than or equal to 90% of their needs  MONITOR:   PO intake, Supplement acceptance, Labs, Weight trends  REASON FOR ASSESSMENT:   Malnutrition Screening Tool, Consult Assessment of nutrition requirement/status  ASSESSMENT:   74 year old male with medical history of HTN and BPH. He was admitted to [facility] earlier this month d/t fatigue and constipation and was found to have severe hypercalcemia and CT scan showed a large mediastinal and liver mass with diffuse retroperitoneal lymphadenopathy. Liver biopsy was performed and showed large B-cell lymphoma; stage IVa. He was admitted after outpatient Oncology appointment in order start treatment for lymphoma (EPOCH-R).  He ate 100% of breakfast and lunch today (total of 1248 kcal, 29 grams protein).   Weight on 11/22 was 223 lb and weight has been stable since 06/14/18. Mild pitting edema to BLE documented in the edema section of flow sheet.  Brief MST note entered by RD at [facility] on 04/21/20 with patient consuming 75% of meals on average at that time.   Per notes: - stage 4 diffuse large B-cell lymphoma - AKI - high risk for tumor lysis syndrome - first chemo was today (11/24) - plan for d/c on Saturday (11/27)   Labs reviewed; BUN: 50 mg/dl, creatinine: 1.59 mg/dl, Ca: 10.8 mg/dl, LFTs slightly elevated, GFR: 46 ml/min. Medications reviewed; 100 mg colace BID, 17 g miralax/day, 20 mEq Klor-Con x1 dose 11/24.    NUTRITION - FOCUSED  PHYSICAL EXAM:  unable to complete at this time.   Diet Order:   Diet Order            Diet regular Room service appropriate? Yes; Fluid consistency: Thin  Diet effective now                 EDUCATION NEEDS:   No education needs have been identified at this time  Skin:  Skin Assessment: Reviewed RN Assessment  Last BM:  11/17  Height:   Ht Readings from Last 1 Encounters:  04/30/20 5\' 11"  (1.803 m)    Weight:   Wt Readings from Last 1 Encounters:  04/30/20 101.3 kg     Estimated Nutritional Needs:  Kcal:  2200-2400 kcal Protein:  105-120 grams Fluid:  >/= 2.3 L/day      Jarome Matin, MS, RD, LDN, CNSC Inpatient Clinical Dietitian RD pager # available in [facility]  After hours/weekend pager # available in [facility]

## 2020-05-03 ENCOUNTER — Inpatient Hospital Stay (HOSPITAL_COMMUNITY): Payer: Medicare HMO

## 2020-05-03 DIAGNOSIS — R140 Abdominal distension (gaseous): Secondary | ICD-10-CM

## 2020-05-03 DIAGNOSIS — G4733 Obstructive sleep apnea (adult) (pediatric): Secondary | ICD-10-CM

## 2020-05-03 DIAGNOSIS — C8339 Diffuse large B-cell lymphoma, extranodal and solid organ sites: Secondary | ICD-10-CM | POA: Diagnosis not present

## 2020-05-03 DIAGNOSIS — C8330 Diffuse large B-cell lymphoma, unspecified site: Secondary | ICD-10-CM | POA: Diagnosis not present

## 2020-05-03 LAB — COMPREHENSIVE METABOLIC PANEL
ALT: 58 U/L — ABNORMAL HIGH (ref 0–44)
AST: 44 U/L — ABNORMAL HIGH (ref 15–41)
Albumin: 3 g/dL — ABNORMAL LOW (ref 3.5–5.0)
Alkaline Phosphatase: 94 U/L (ref 38–126)
Anion gap: 9 (ref 5–15)
BUN: 52 mg/dL — ABNORMAL HIGH (ref 8–23)
CO2: 25 mmol/L (ref 22–32)
Calcium: 10.8 mg/dL — ABNORMAL HIGH (ref 8.9–10.3)
Chloride: 102 mmol/L (ref 98–111)
Creatinine, Ser: 1.76 mg/dL — ABNORMAL HIGH (ref 0.61–1.24)
GFR, Estimated: 41 mL/min — ABNORMAL LOW (ref >60)
Glucose, Bld: 72 mg/dL (ref 70–99)
Potassium: 3.9 mmol/L (ref 3.5–5.1)
Sodium: 136 mmol/L (ref 135–145)
Total Bilirubin: 1 mg/dL (ref 0.3–1.2)
Total Protein: 5.1 g/dL — ABNORMAL LOW (ref 6.5–8.1)

## 2020-05-03 LAB — CBC WITH DIFFERENTIAL/PLATELET
Abs Immature Granulocytes: 0.06 10*3/uL (ref 0.00–0.07)
Basophils Absolute: 0 10*3/uL (ref 0.0–0.1)
Basophils Relative: 0 %
Eosinophils Absolute: 0 10*3/uL (ref 0.0–0.5)
Eosinophils Relative: 0 %
HCT: 26.8 % — ABNORMAL LOW (ref 39.0–52.0)
Hemoglobin: 9.1 g/dL — ABNORMAL LOW (ref 13.0–17.0)
Immature Granulocytes: 1 %
Lymphocytes Relative: 3 %
Lymphs Abs: 0.2 10*3/uL — ABNORMAL LOW (ref 0.7–4.0)
MCH: 30.1 pg (ref 26.0–34.0)
MCHC: 34 g/dL (ref 30.0–36.0)
MCV: 88.7 fL (ref 80.0–100.0)
Monocytes Absolute: 0.5 10*3/uL (ref 0.1–1.0)
Monocytes Relative: 7 %
Neutro Abs: 5.6 10*3/uL (ref 1.7–7.7)
Neutrophils Relative %: 89 %
Platelets: 148 10*3/uL — ABNORMAL LOW (ref 150–400)
RBC: 3.02 MIL/uL — ABNORMAL LOW (ref 4.22–5.81)
RDW: 14.3 % (ref 11.5–15.5)
WBC: 6.3 10*3/uL (ref 4.0–10.5)
nRBC: 0 % (ref 0.0–0.2)

## 2020-05-03 LAB — URIC ACID: Uric Acid, Serum: 7.8 mg/dL (ref 3.7–8.6)

## 2020-05-03 MED ORDER — SIMETHICONE 80 MG PO CHEW
80.0000 mg | CHEWABLE_TABLET | Freq: Four times a day (QID) | ORAL | Status: DC | PRN
  Administered 2020-05-03 – 2020-05-06 (×6): 80 mg via ORAL
  Filled 2020-05-03 (×6): qty 1

## 2020-05-03 MED ORDER — MAGNESIUM HYDROXIDE 400 MG/5ML PO SUSP
30.0000 mL | Freq: Every day | ORAL | Status: DC | PRN
  Administered 2020-05-03 – 2020-05-06 (×4): 30 mL via ORAL
  Filled 2020-05-03 (×4): qty 30

## 2020-05-03 MED ORDER — FLEET ENEMA 7-19 GM/118ML RE ENEM
1.0000 | ENEMA | Freq: Once | RECTAL | Status: AC
  Administered 2020-05-03: 1 via RECTAL
  Filled 2020-05-03: qty 1

## 2020-05-03 NOTE — Progress Notes (Signed)
I saw John [name] this morning. He was having some problems. He has been doing quite well otherwise. He has been in hospital for IV infusional chemotherapy for his diffuse large cell non-Hodgkin's lymphoma.  This morning, he had abdominal distention and pain. He said the pain was under his rib cage bilaterally. He did not have any shortness of breath. There is no chest wall pain. There is no substernal pain. Had no palpitations.  He has small bowel movement yesterday. Otherwise, has not had a bowel movement. He is not had obvious diarrhea. He has had no problems urinating.  His intake and output put are in the negative range which is nice. He is urinating well.  He has had no fever. There is no bleeding.  His labs show white count 6.3. Hemoglobin 9.1. Platelet count 148,000. His BUN is 52 creatinine 1.76. Calcium is 10.8 with an albumin of 3. His uric acid is 7.8. His potassium is 3.9.  On his exam, his temperature is 97.5. Pulse 81. Blood pressure 156/68. His lungs sound clear bilaterally. I hear no wheezes. Cardiac exam regular rate and rhythm. There are no extra beats. There is no murmurs. Abdomen is slightly distended. He has decreased bowel sounds. He has no obvious fluid wave. There is no palpable abdominal mass. He has no guarding or rebound tenderness. There is no palpable hepatomegaly. Extremities show some trace edema in his legs bilaterally. Neurological exam is nonfocal.  John [name] has diffuse large cell non-Hodgkin's lymphoma. He is on chemotherapy with infusional EPOCH.  His heart is a what the abdominal issue is. He does look a little distended. I will know if he is constipated. I do not think that he has had any type of perforation. He certainly does not look septic. He was alert and oriented. I do not think he is any bleed. His hemoglobin is not changed.  I noted that his uric acid is "creeping up." His calcium when corrected is on the high side. I suppose this could always lead to  decreased intestinal movement.  We will get an abdominal x-ray on him. At this time, I do not think we need any CT scans. He is stable with his vital signs. I do not leave this is a thromboembolism with a long. He has no pulmonary symptoms.  If he has problems with constipation, we can certainly give him lactulose to try to move things through.  We will have to watch him closely today.  Lattie Haw, MD  1 Chronicles 16:34

## 2020-05-03 NOTE — Progress Notes (Signed)
IP PROGRESS NOTE  Subjective:   Patient seen and examined this morning. Events and documentation by Dr. Marin Olp noted and appreciated. [name] reported reasonable tolerance to chemotherapy without any nausea, vomiting or abdominal pain. He woke up this morning with complaints of abdominal distention, discomfort and slight dyspnea on exertion. He was given oxygen supplementation and abdominal x-ray was obtained.  Clinically, he continues to have discomfort in the abdomen this morning despite taking MiraLAX earlier this morning. He denies any cough, fevers or chills. He does have trace lower extremity edema. He is urinating freely without any difficulties.   Objective:  Vital signs in last 24 hours: Temp:  [97.5 F (36.4 C)-98.1 F (36.7 C)] 97.5 F (36.4 C) (11/25 0433) Pulse Rate:  [79-110] 81 (11/25 0531) Resp:  [15-16] 16 (11/25 0433) BP: (116-156)/(53-68) 156/68 (11/25 0531) SpO2:  [98 %-100 %] 100 % (11/25 0531) Weight change:  Last BM Date: 04/25/20  Intake/Output from previous day: 11/24 0701 - 11/25 0700 In: 1495.2 [P.O.:720; IV Piggyback:775.2] Out: 4900 [Urine:4900] General: Alert, awake appeared in mild distress. Head: Normocephalic atraumatic. Mouth: mucous membranes moist, pharynx normal without lesions Eyes: No scleral icterus.  Pupils are equal and round reactive to light. Resp: clear to auscultation bilaterally without rhonchi or wheezes or dullness to percussion. Cardio: regular rate and rhythm, S1, S2 normal, no murmur. Trace ankle edema noted. GI: soft, non-tender; bowel sounds normal; no masses. Slightly distended. Musculoskeletal: No joint deformity or effusion. Neurological: No motor, sensory deficits.  Intact deep tendon reflexes. Skin: No rashes or lesions.  PICC-without erythema  Lab Results: Recent Labs    05/02/20 0548 05/03/20 0456  WBC 7.7 6.3  HGB 9.1* 9.1*  HCT 26.7* 26.8*  PLT 160 148*    BMET Recent Labs    05/02/20 0548  05/03/20 0456  NA 136 136  K 4.1 3.9  CL 100 102  CO2 25 25  GLUCOSE 78 72  BUN 50* 52*  CREATININE 1.59* 1.76*  CALCIUM 10.8* 10.8*    Studies/Results: DG Abd Portable 1V  Result Date: 05/03/2020 CLINICAL DATA:  Abdominal distension. EXAM: PORTABLE ABDOMEN - 1 VIEW COMPARISON:  CT AP from 04/25/2020 FINDINGS: The bowel gas pattern is normal. No radio-opaque calculi or other significant radiographic abnormality are seen. Lumbar spondylosis identified. IMPRESSION: Nonobstructive bowel gas pattern. Electronically Signed   By: Kerby Moors M.D.   On: 05/03/2020 08:41    Medications: I have reviewed the patient's current medications.  Assessment/Plan:  74 year old man with:  1. 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 He is currently receiving EPOCH with rituximab started on 05/01/2020. He has tolerated the infusion without any complications although he has developed worsening abdominal distention and discomfort.  Day 3 of chemotherapy will be temporarily on hold till his symptoms improved and anticipate restarting it in the immediate future.   2. Abdominal distention and discomfort: Imaging studies were personally reviewed and discussed with the patient today. No evidence of abdominal obstruction noted. His examination does not reveal any rebound or guarding to suggest anything acute. I suggested he symptoms are related to worsening constipation.  I recommended proceeding with milk of magnesia as well as fleets enema to expedite his bowel movements given his overall discomfort.  3. Fluid/electrolyte and nutrition: Laboratory data reviewed and continues to show no evidence of tumor lysis at this time with normal potassium and mild elevation in his calcium. His uric acid is 7.8 with a creatinine  clearance remains around 40 cc/min.  We will continue to monitor his I's and O's and currently he has negative balance after  receiving a dose of Lasix.  4. Anemia: Related to malignancy with hemoglobin currently stable at 9.1.   5. Tumor lysis prophylaxis: He is currently on allopurinol and will continue to monitor closely.   6. Disposition and discharge: His discharge will be pending completion of cycle of chemotherapy anticipated to be on November 27 or 28.   30  minutes were dedicated to this visit. The time was spent on reviewing laboratory data, imaging studies,  discussing differential diagnosis and addressing patient's complaints and potential complications related to his chemotherapy..    LOS: 2 days   Zola Button 05/03/2020, 9:17 AM

## 2020-05-04 DIAGNOSIS — R140 Abdominal distension (gaseous): Secondary | ICD-10-CM | POA: Diagnosis not present

## 2020-05-04 DIAGNOSIS — C8330 Diffuse large B-cell lymphoma, unspecified site: Secondary | ICD-10-CM | POA: Diagnosis not present

## 2020-05-04 DIAGNOSIS — G4733 Obstructive sleep apnea (adult) (pediatric): Secondary | ICD-10-CM | POA: Diagnosis not present

## 2020-05-04 DIAGNOSIS — C8339 Diffuse large B-cell lymphoma, extranodal and solid organ sites: Secondary | ICD-10-CM | POA: Diagnosis not present

## 2020-05-04 LAB — COMPREHENSIVE METABOLIC PANEL
ALT: 245 U/L — ABNORMAL HIGH (ref 0–44)
AST: 207 U/L — ABNORMAL HIGH (ref 15–41)
Albumin: 3.1 g/dL — ABNORMAL LOW (ref 3.5–5.0)
Alkaline Phosphatase: 97 U/L (ref 38–126)
Anion gap: 8 (ref 5–15)
BUN: 47 mg/dL — ABNORMAL HIGH (ref 8–23)
CO2: 25 mmol/L (ref 22–32)
Calcium: 9.4 mg/dL (ref 8.9–10.3)
Chloride: 98 mmol/L (ref 98–111)
Creatinine, Ser: 1.58 mg/dL — ABNORMAL HIGH (ref 0.61–1.24)
GFR, Estimated: 46 mL/min — ABNORMAL LOW (ref >60)
Glucose, Bld: 84 mg/dL (ref 70–99)
Potassium: 3.5 mmol/L (ref 3.5–5.1)
Sodium: 131 mmol/L — ABNORMAL LOW (ref 135–145)
Total Bilirubin: 1.5 mg/dL — ABNORMAL HIGH (ref 0.3–1.2)
Total Protein: 5 g/dL — ABNORMAL LOW (ref 6.5–8.1)

## 2020-05-04 LAB — CBC WITH DIFFERENTIAL/PLATELET
Abs Immature Granulocytes: 0.03 10*3/uL (ref 0.00–0.07)
Basophils Absolute: 0 10*3/uL (ref 0.0–0.1)
Basophils Relative: 0 %
Eosinophils Absolute: 0 10*3/uL (ref 0.0–0.5)
Eosinophils Relative: 0 %
HCT: 24.5 % — ABNORMAL LOW (ref 39.0–52.0)
Hemoglobin: 8.5 g/dL — ABNORMAL LOW (ref 13.0–17.0)
Immature Granulocytes: 1 %
Lymphocytes Relative: 2 %
Lymphs Abs: 0.1 10*3/uL — ABNORMAL LOW (ref 0.7–4.0)
MCH: 29.9 pg (ref 26.0–34.0)
MCHC: 34.7 g/dL (ref 30.0–36.0)
MCV: 86.3 fL (ref 80.0–100.0)
Monocytes Absolute: 0.2 10*3/uL (ref 0.1–1.0)
Monocytes Relative: 3 %
Neutro Abs: 5.4 10*3/uL (ref 1.7–7.7)
Neutrophils Relative %: 94 %
Platelets: 135 10*3/uL — ABNORMAL LOW (ref 150–400)
RBC: 2.84 MIL/uL — ABNORMAL LOW (ref 4.22–5.81)
RDW: 14 % (ref 11.5–15.5)
WBC: 5.7 10*3/uL (ref 4.0–10.5)
nRBC: 0 % (ref 0.0–0.2)

## 2020-05-04 LAB — URIC ACID: Uric Acid, Serum: 8.3 mg/dL (ref 3.7–8.6)

## 2020-05-04 MED ORDER — SENNOSIDES-DOCUSATE SODIUM 8.6-50 MG PO TABS
2.0000 | ORAL_TABLET | Freq: Two times a day (BID) | ORAL | Status: DC
  Administered 2020-05-04 – 2020-05-06 (×6): 2 via ORAL
  Filled 2020-05-04 (×8): qty 2

## 2020-05-04 MED ORDER — SODIUM CHLORIDE 0.9 % IV SOLN
Freq: Once | INTRAVENOUS | Status: AC
  Administered 2020-05-05: 36 mg via INTRAVENOUS
  Filled 2020-05-04: qty 8

## 2020-05-04 MED ORDER — ACETAMINOPHEN 325 MG PO TABS
650.0000 mg | ORAL_TABLET | Freq: Once | ORAL | Status: AC
  Administered 2020-05-04: 650 mg via ORAL
  Filled 2020-05-04: qty 2

## 2020-05-04 MED ORDER — DIPHENHYDRAMINE HCL 50 MG PO CAPS
50.0000 mg | ORAL_CAPSULE | Freq: Once | ORAL | Status: AC
  Administered 2020-05-04: 50 mg via ORAL
  Filled 2020-05-04: qty 1

## 2020-05-04 MED ORDER — SODIUM CHLORIDE 0.9 % IV SOLN
Freq: Once | INTRAVENOUS | Status: AC
  Administered 2020-05-04: 18 mg via INTRAVENOUS
  Filled 2020-05-04: qty 4

## 2020-05-04 MED ORDER — SODIUM CHLORIDE 0.9 % IV SOLN
375.0000 mg/m2 | Freq: Once | INTRAVENOUS | Status: AC
  Administered 2020-05-04: 800 mg via INTRAVENOUS
  Filled 2020-05-04: qty 50

## 2020-05-04 MED ORDER — SODIUM CHLORIDE 0.9 % IV SOLN
750.0000 mg/m2 | Freq: Once | INTRAVENOUS | Status: AC
  Administered 2020-05-05: 1680 mg via INTRAVENOUS
  Filled 2020-05-04: qty 84

## 2020-05-04 MED ORDER — VINCRISTINE SULFATE CHEMO INJECTION 1 MG/ML
Freq: Once | INTRAVENOUS | Status: AC
  Filled 2020-05-04: qty 11

## 2020-05-04 NOTE — Progress Notes (Addendum)
IP PROGRESS NOTE  Subjective:   Patient seen and examined this morning.  Reports less abdominal distention this morning.  Had a medium-sized bowel movement yesterday following enema.  Reports mild, diffuse discomfort in his abdomen.  He states that he did not eat much yesterday but is planning a small breakfast this morning.  Denies nausea and vomiting.  Denies mucositis.  Continues to have trace lower extremity edema. Urinating without difficulty.  No fevers or chills.  Objective:  Vital signs in last 24 hours: Temp:  [97.5 F (36.4 C)-98.1 F (36.7 C)] 98.1 F (36.7 C) (11/26 0445) Pulse Rate:  [72-87] 72 (11/26 0445) Resp:  [16-17] 16 (11/26 0445) BP: (120-166)/(55-76) 120/55 (11/26 0445) SpO2:  [93 %-99 %] 95 % (11/26 0445) Weight change:  Last BM Date: 05/02/20  Intake/Output from previous day: 11/25 0701 - 11/26 0700 In: 849 [P.O.:118; IV Piggyback:731] Out: 2250 [Urine:2250] General: Alert, appears comfortable. Head: Normocephalic atraumatic. Mouth: mucous membranes moist, pharynx normal without lesions Eyes: No scleral icterus.  Pupils are equal and round reactive to light. Resp: clear to auscultation bilaterally without rhonchi or wheezes or dullness to percussion. Cardio: regular rate and rhythm, S1, S2 normal, no murmur. Trace ankle edema noted. GI: soft, non-tender; bowel sounds normal; no masses.  Musculoskeletal: No joint deformity or effusion. Neurological: No motor, sensory deficits.  Intact deep tendon reflexes. Skin: No rashes or lesions.  PICC-without erythema  Lab Results: Recent Labs    05/03/20 0456 05/04/20 0627  WBC 6.3 5.7  HGB 9.1* 8.5*  HCT 26.8* 24.5*  PLT 148* 135*    BMET Recent Labs    05/03/20 0456 05/04/20 0627  NA 136 131*  K 3.9 3.5  CL 102 98  CO2 25 25  GLUCOSE 72 84  BUN 52* 47*  CREATININE 1.76* 1.58*  CALCIUM 10.8* 9.4    Studies/Results: DG Abd Portable 1V  Result Date: 05/03/2020 CLINICAL DATA:  Abdominal  distension. EXAM: PORTABLE ABDOMEN - 1 VIEW COMPARISON:  CT AP from 04/25/2020 FINDINGS: The bowel gas pattern is normal. No radio-opaque calculi or other significant radiographic abnormality are seen. Lumbar spondylosis identified. IMPRESSION: Nonobstructive bowel gas pattern. Electronically Signed   By: Kerby Moors M.D.   On: 05/03/2020 08:41    Medications: I have reviewed the patient's current medications.  Assessment/Plan:  74 year old man with:  1. 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 He is currently receiving EPOCH with rituximab started on 05/01/2020. He has tolerated the infusion without any complications but developed abdominal distention yesterday requiring chemo to be held temporarily.  Abdominal distention improved today after having a medium-sized bowel movement.  Still has mild diffuse tenderness.  We will plan to proceed with rituximab today once current bag of chemotherapy is completed.  Once rituximab complete, will proceed with day 4 of his chemotherapy.  The plan was explained to the patient and he states understanding.  He is aware that hospital discharge will likely occur on Sunday instead of Saturday.  2. Abdominal distention and discomfort: Abdominal x-ray obtained 05/03/2020 without abdominal obstruction. His examination does not reveal any rebound or guarding to suggest anything acute.  Symptoms likely due to constipation.  He has had some improvement after having a medium-sized bowel movement.  Still feels constipated though.  Continue MiraLAX daily.  Will DC Colace and change to Senokot-S 2 tablets twice a day.  He also has milk of magnesia ordered daily as needed.  3. Fluid/electrolyte  and nutrition: Laboratory data reviewed and continues to show no evidence of tumor lysis at this time with normal potassium and normalization of his calcium.  His uric acid is 8.3 with a creatinine clearance remains around  40-45 cc/min.  We will continue to monitor his I's and O's and currently he has negative balance after receiving a dose of Lasix on 05/02/2020.  4. Anemia: Related to malignancy and chemotherapy.  Hemoglobin slowly drifting down is 8.5 today.  Discussed with the patient that we would like to keep his hemoglobin above 8.  Discussed that he may need a PRBC transfusion prior to discharge.  Per Dr. Burr Medico, blood products to be irradiated.  5. Tumor lysis prophylaxis: He is currently on allopurinol and will continue to monitor closely.  6. Disposition and discharge: Discharge pending completion of chemotherapy.  Anticipate hospital discharge on 05/06/2020.  30  minutes were dedicated to this visit. The time was spent on reviewing laboratory data, imaging studies,  discussing differential diagnosis and addressing patient's complaints and potential complications related to his chemotherapy.   LOS: 3 days   Mikey Bussing 05/04/2020, 8:49 AM  Patient seen and examined personally today.  His abdominal discomfort has improved although he has not had a bowel movement.  He denies any fevers, chills or vomiting.  He denies any dysuria or hematuria.  He denies any discoloration of his urine.  On exam, he is awake alert.  No distress.  He did report some slight discomfort associated with his lower abdomen on palpation.  Trace edema noted on his ankles.  Laboratory data reviewed and showed elevation in his liver function test although his bilirubin is not dramatically different.  The plan is to proceed with the chemotherapy as scheduled with the rituximab today and resumption of day 3 of chemotherapy with EPOCH upon conclusion of rituximab.  We will continue with aggressive bowel regimen and mild hydration and monitoring of his tumor lysis parameters.

## 2020-05-04 NOTE — Plan of Care (Signed)

## 2020-05-04 NOTE — Progress Notes (Signed)
Ok to treat today with elevated LFTs'   T.O. Dr Creta Levin, PharmD

## 2020-05-05 DIAGNOSIS — C8330 Diffuse large B-cell lymphoma, unspecified site: Secondary | ICD-10-CM | POA: Diagnosis not present

## 2020-05-05 DIAGNOSIS — G4733 Obstructive sleep apnea (adult) (pediatric): Secondary | ICD-10-CM | POA: Diagnosis not present

## 2020-05-05 DIAGNOSIS — C8339 Diffuse large B-cell lymphoma, extranodal and solid organ sites: Secondary | ICD-10-CM | POA: Diagnosis not present

## 2020-05-05 DIAGNOSIS — R140 Abdominal distension (gaseous): Secondary | ICD-10-CM | POA: Diagnosis not present

## 2020-05-05 LAB — COMPREHENSIVE METABOLIC PANEL
ALT: 207 U/L — ABNORMAL HIGH (ref 0–44)
AST: 130 U/L — ABNORMAL HIGH (ref 15–41)
Albumin: 2.8 g/dL — ABNORMAL LOW (ref 3.5–5.0)
Alkaline Phosphatase: 95 U/L (ref 38–126)
Anion gap: 9 (ref 5–15)
BUN: 35 mg/dL — ABNORMAL HIGH (ref 8–23)
CO2: 23 mmol/L (ref 22–32)
Calcium: 8.9 mg/dL (ref 8.9–10.3)
Chloride: 104 mmol/L (ref 98–111)
Creatinine, Ser: 1.41 mg/dL — ABNORMAL HIGH (ref 0.61–1.24)
GFR, Estimated: 53 mL/min — ABNORMAL LOW (ref >60)
Glucose, Bld: 92 mg/dL (ref 70–99)
Potassium: 3.3 mmol/L — ABNORMAL LOW (ref 3.5–5.1)
Sodium: 136 mmol/L (ref 135–145)
Total Bilirubin: 1.3 mg/dL — ABNORMAL HIGH (ref 0.3–1.2)
Total Protein: 4.7 g/dL — ABNORMAL LOW (ref 6.5–8.1)

## 2020-05-05 LAB — CBC WITH DIFFERENTIAL/PLATELET
Abs Immature Granulocytes: 0.02 10*3/uL (ref 0.00–0.07)
Basophils Absolute: 0 10*3/uL (ref 0.0–0.1)
Basophils Relative: 0 %
Eosinophils Absolute: 0 10*3/uL (ref 0.0–0.5)
Eosinophils Relative: 0 %
HCT: 23.9 % — ABNORMAL LOW (ref 39.0–52.0)
Hemoglobin: 8.1 g/dL — ABNORMAL LOW (ref 13.0–17.0)
Immature Granulocytes: 1 %
Lymphocytes Relative: 1 %
Lymphs Abs: 0.1 10*3/uL — ABNORMAL LOW (ref 0.7–4.0)
MCH: 29.5 pg (ref 26.0–34.0)
MCHC: 33.9 g/dL (ref 30.0–36.0)
MCV: 86.9 fL (ref 80.0–100.0)
Monocytes Absolute: 0 10*3/uL — ABNORMAL LOW (ref 0.1–1.0)
Monocytes Relative: 1 %
Neutro Abs: 3.8 10*3/uL (ref 1.7–7.7)
Neutrophils Relative %: 97 %
Platelets: 93 10*3/uL — ABNORMAL LOW (ref 150–400)
RBC: 2.75 MIL/uL — ABNORMAL LOW (ref 4.22–5.81)
RDW: 13.9 % (ref 11.5–15.5)
WBC: 4 10*3/uL (ref 4.0–10.5)
nRBC: 0 % (ref 0.0–0.2)

## 2020-05-05 LAB — URIC ACID: Uric Acid, Serum: 8 mg/dL (ref 3.7–8.6)

## 2020-05-05 MED ORDER — HYDROCODONE-ACETAMINOPHEN 5-325 MG PO TABS
1.0000 | ORAL_TABLET | Freq: Four times a day (QID) | ORAL | Status: DC | PRN
  Administered 2020-05-05 – 2020-05-06 (×2): 1 via ORAL
  Filled 2020-05-05 (×2): qty 1

## 2020-05-05 NOTE — Progress Notes (Signed)
[name] is doing pretty well this morning.  He is having better bowel movements.  He is hungry this morning.  His chemotherapy is going well.  I noticed that his liver function studies were little bit elevated yesterday.  I suppose this could be from the chemotherapy.  Could also be from allopurinol.  We will have to see what his liver function studies are today.  His hemoglobin is 8.1 this morning.  I suspect he may need to be transfused.  We will have to see what his hemoglobin is tomorrow.  He has had no bleeding.  He has had no fever.  He has had no cough or shortness of breath.  He has had no rashes.  I am still awaiting his metabolic panel today.  We will have to see what his uric acid is.  He had a very good urine output.  He has been in the negative range with his urine output.  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  I suppose this could be the source for the increased LFTs.  He is out of bed.  His vital signs show temperature 98.1.  Pulse 85.  Blood pressure 140/66.  His head neck exam shows no scleral icterus.  There is no oral lesions.  He has no mucositis.  He has no adenopathy in the neck.  Lungs are clear bilaterally.  Cardiac exam regular rate and rhythm.  He has no murmurs.  Abdomen is soft.  He has decent bowel sounds.  Liver edge might be palpable at the right costal margin.  He has no fluid wave.  There is no guarding or rebound tenderness.  Extremities shows no clubbing, cyanosis or edema.  Neurological exam shows no focal neurological deficit.  Skin exam shows no rashes, ecchymoses or petechia.  [name] is received his first cycle of R-EPOCH.  He will have his last bag today.  We will have to monitor his hemoglobin closely.  We will have to monitor his LFTs closely.  Hopefully, he will be able to go home tomorrow.  However, it certainly would not surprise me if we have to keep an extra day or so for transfusions or if it is liver studies are higher.  I appreciate the  great care that he obviously is getting from the staff up on 6 E.  Lattie Haw, MD  Hebrews 12:12

## 2020-05-06 ENCOUNTER — Encounter (HOSPITAL_COMMUNITY): Payer: Self-pay | Admitting: Hematology

## 2020-05-06 ENCOUNTER — Inpatient Hospital Stay (HOSPITAL_COMMUNITY): Payer: Medicare HMO

## 2020-05-06 DIAGNOSIS — Z7189 Other specified counseling: Secondary | ICD-10-CM

## 2020-05-06 DIAGNOSIS — C8339 Diffuse large B-cell lymphoma, extranodal and solid organ sites: Secondary | ICD-10-CM | POA: Diagnosis not present

## 2020-05-06 DIAGNOSIS — R140 Abdominal distension (gaseous): Secondary | ICD-10-CM | POA: Diagnosis not present

## 2020-05-06 DIAGNOSIS — C8330 Diffuse large B-cell lymphoma, unspecified site: Secondary | ICD-10-CM | POA: Diagnosis not present

## 2020-05-06 DIAGNOSIS — G4733 Obstructive sleep apnea (adult) (pediatric): Secondary | ICD-10-CM | POA: Diagnosis not present

## 2020-05-06 HISTORY — DX: Other specified counseling: Z71.89

## 2020-05-06 LAB — CBC WITH DIFFERENTIAL/PLATELET
Abs Immature Granulocytes: 0.02 10*3/uL (ref 0.00–0.07)
Basophils Absolute: 0 10*3/uL (ref 0.0–0.1)
Basophils Relative: 0 %
Eosinophils Absolute: 0 10*3/uL (ref 0.0–0.5)
Eosinophils Relative: 0 %
HCT: 23.7 % — ABNORMAL LOW (ref 39.0–52.0)
Hemoglobin: 8.3 g/dL — ABNORMAL LOW (ref 13.0–17.0)
Immature Granulocytes: 0 %
Lymphocytes Relative: 1 %
Lymphs Abs: 0.1 10*3/uL — ABNORMAL LOW (ref 0.7–4.0)
MCH: 30 pg (ref 26.0–34.0)
MCHC: 35 g/dL (ref 30.0–36.0)
MCV: 85.6 fL (ref 80.0–100.0)
Monocytes Absolute: 0 10*3/uL — ABNORMAL LOW (ref 0.1–1.0)
Monocytes Relative: 0 %
Neutro Abs: 5.4 10*3/uL (ref 1.7–7.7)
Neutrophils Relative %: 99 %
Platelets: 85 10*3/uL — ABNORMAL LOW (ref 150–400)
RBC: 2.77 MIL/uL — ABNORMAL LOW (ref 4.22–5.81)
RDW: 13.7 % (ref 11.5–15.5)
WBC: 5.5 10*3/uL (ref 4.0–10.5)
nRBC: 0 % (ref 0.0–0.2)

## 2020-05-06 LAB — COMPREHENSIVE METABOLIC PANEL
ALT: 351 U/L — ABNORMAL HIGH (ref 0–44)
AST: 285 U/L — ABNORMAL HIGH (ref 15–41)
Albumin: 3 g/dL — ABNORMAL LOW (ref 3.5–5.0)
Alkaline Phosphatase: 116 U/L (ref 38–126)
Anion gap: 10 (ref 5–15)
BUN: 44 mg/dL — ABNORMAL HIGH (ref 8–23)
CO2: 24 mmol/L (ref 22–32)
Calcium: 9.3 mg/dL (ref 8.9–10.3)
Chloride: 100 mmol/L (ref 98–111)
Creatinine, Ser: 1.44 mg/dL — ABNORMAL HIGH (ref 0.61–1.24)
GFR, Estimated: 52 mL/min — ABNORMAL LOW (ref >60)
Glucose, Bld: 129 mg/dL — ABNORMAL HIGH (ref 70–99)
Potassium: 3.2 mmol/L — ABNORMAL LOW (ref 3.5–5.1)
Sodium: 134 mmol/L — ABNORMAL LOW (ref 135–145)
Total Bilirubin: 1.6 mg/dL — ABNORMAL HIGH (ref 0.3–1.2)
Total Protein: 5.3 g/dL — ABNORMAL LOW (ref 6.5–8.1)

## 2020-05-06 LAB — URIC ACID: Uric Acid, Serum: 7.7 mg/dL (ref 3.7–8.6)

## 2020-05-06 MED ORDER — METRONIDAZOLE 500 MG PO TABS
500.0000 mg | ORAL_TABLET | Freq: Three times a day (TID) | ORAL | Status: DC
  Administered 2020-05-06 – 2020-05-07 (×4): 500 mg via ORAL
  Filled 2020-05-06 (×5): qty 1

## 2020-05-06 MED ORDER — LACTULOSE 10 GM/15ML PO SOLN
20.0000 g | Freq: Three times a day (TID) | ORAL | Status: DC
  Administered 2020-05-06 (×2): 20 g via ORAL
  Filled 2020-05-06 (×4): qty 30

## 2020-05-06 MED ORDER — POTASSIUM CHLORIDE 10 MEQ/50ML IV SOLN
10.0000 meq | INTRAVENOUS | Status: AC
  Administered 2020-05-06 (×4): 10 meq via INTRAVENOUS
  Filled 2020-05-06 (×4): qty 50

## 2020-05-06 MED ORDER — POTASSIUM CHLORIDE 10 MEQ/100ML IV SOLN
INTRAVENOUS | Status: AC
  Filled 2020-05-06: qty 100

## 2020-05-06 MED ORDER — METRONIDAZOLE IN NACL 5-0.79 MG/ML-% IV SOLN: 500.0000 mg | Freq: Three times a day (TID) | INTRAVENOUS | Status: DC

## 2020-05-06 MED ORDER — IOHEXOL 300 MG/ML  SOLN
80.0000 mL | Freq: Once | INTRAMUSCULAR | Status: AC | PRN
  Administered 2020-05-06: 80 mL via INTRAVENOUS

## 2020-05-06 MED ORDER — CIPROFLOXACIN IN D5W 400 MG/200ML IV SOLN
400.0000 mg | Freq: Two times a day (BID) | INTRAVENOUS | Status: DC
  Administered 2020-05-06 – 2020-05-07 (×2): 400 mg via INTRAVENOUS
  Filled 2020-05-06 (×2): qty 200

## 2020-05-06 MED ORDER — SODIUM CHLORIDE 0.9 % IV SOLN: INTRAVENOUS | Status: DC

## 2020-05-06 MED ORDER — IOHEXOL 9 MG/ML PO SOLN
500.0000 mL | ORAL | Status: AC
  Administered 2020-05-06 (×2): 500 mL via ORAL

## 2020-05-06 NOTE — Progress Notes (Addendum)
Unfortunately, John [name] is having a hard time this morning.  He started having worsening abdominal pain last evening.  He says he just needs to go to the bathroom.  His liver function studies are worse.  I am not sure exactly what might be going on.  I know he has a lot of foam in the abdomen.  I do not think there is any obstruction.  I think that a CT scan is probably going to be needed.  I need to make sure that there is nothing going on that could be causing all this discomfort.  I am not sure why the liver function studies are elevated.  He has had no fever.  Is been no bleeding.  He has had no dysuria.  His labs today show a sodium 134.  Potassium 3.2.  His BUN is 44 creatinine 1.44.  Calcium is 9.3 with an albumin 3.0.  Uric acid is 7.7.  His bilirubin is 1.6.  SGPT is 351 with an SGOT of 285.  His white cell count is 5.5.  Hemoglobin 8.3.  Platelet count 85,000.  He has had no rash.  He has had no leg swelling.  His vital signs are temperature of 98.2.  Pulse 86.  Blood pressure 171/78.  Head and neck exam shows no scleral icterus.  There is no oral lesions.  His mouth is a little bit dry.  There is no adenopathy in the neck.  Lungs are clear bilaterally.  Cardiac exam regular rate and rhythm.  Abdomen is soft.  There is some distention.  He has good bowel sounds.  There is no guarding or rebound tenderness.  Is no obvious fluid wave.  I cannot palpate his liver or spleen.  Extremities shows no clubbing, cyanosis or edema.  For right now, I just do not think we can let John [name] go home.  I just hate that he is having this problem.  Maybe it is just constipation.  However, the elevated liver function studies are troublesome to me.  I think we should see about the CT scan of the abdomen and pelvis.  This could give [place] some information.  I will try him on some lactulose to see this may not help with some of the constipation.  Again, we will have to keep him in the hospital for right  now.  I appreciate the outstanding care that he is getting from all the staff up on 6 E.  Lattie Haw, MD  Psalm 55:22  ADDENDUM: The CAT scan thankfully, does not show any bleed.  There is no perforation.  Everything looks relatively stable.  The splenic masses are more notable.  The tumors in the liver are about the same.  There is no bleed into any of the tumors.  There is a little bit less ascites.  There might be some diverticulitis.  It is hard to say.  He is having bowel movements with lactulose.  I think would not be a bad idea just to cover him with some antibiotics for diverticulitis.  Given an he is he just received chemotherapy, I will think this would be a bad idea.  He can be switched over to oral agents if things stabilize.  I am just glad that he is feeling better.  He is having less abdominal pain.  Again, he is going to the bathroom with the help of lactulose.  Lattie Haw, MD  Proverbs 15:23

## 2020-05-06 NOTE — Progress Notes (Signed)
Patient stated that lunch was too rich and vomited thin yellow/clear secretions. Discussed with patient and decided to try jello in the meantime.

## 2020-05-07 ENCOUNTER — Other Ambulatory Visit: Payer: Self-pay | Admitting: Hematology

## 2020-05-07 DIAGNOSIS — C8338 Diffuse large B-cell lymphoma, lymph nodes of multiple sites: Secondary | ICD-10-CM

## 2020-05-07 DIAGNOSIS — C8339 Diffuse large B-cell lymphoma, extranodal and solid organ sites: Secondary | ICD-10-CM

## 2020-05-07 LAB — CBC WITH DIFFERENTIAL/PLATELET
Abs Immature Granulocytes: 0.05 10*3/uL (ref 0.00–0.07)
Basophils Absolute: 0 10*3/uL (ref 0.0–0.1)
Basophils Relative: 0 %
Eosinophils Absolute: 0.1 10*3/uL (ref 0.0–0.5)
Eosinophils Relative: 2 %
HCT: 19.9 % — ABNORMAL LOW (ref 39.0–52.0)
Hemoglobin: 6.9 g/dL — CL (ref 13.0–17.0)
Immature Granulocytes: 1 %
Lymphocytes Relative: 1 %
Lymphs Abs: 0 10*3/uL — ABNORMAL LOW (ref 0.7–4.0)
MCH: 30 pg (ref 26.0–34.0)
MCHC: 34.7 g/dL (ref 30.0–36.0)
MCV: 86.5 fL (ref 80.0–100.0)
Monocytes Absolute: 0 10*3/uL — ABNORMAL LOW (ref 0.1–1.0)
Monocytes Relative: 0 %
Neutro Abs: 4.2 10*3/uL (ref 1.7–7.7)
Neutrophils Relative %: 96 %
Platelets: 53 10*3/uL — ABNORMAL LOW (ref 150–400)
RBC: 2.3 MIL/uL — ABNORMAL LOW (ref 4.22–5.81)
RDW: 14 % (ref 11.5–15.5)
WBC: 4.4 10*3/uL (ref 4.0–10.5)
nRBC: 0 % (ref 0.0–0.2)

## 2020-05-07 LAB — COMPREHENSIVE METABOLIC PANEL
ALT: 703 U/L — ABNORMAL HIGH (ref 0–44)
AST: 324 U/L — ABNORMAL HIGH (ref 15–41)
Albumin: 2.7 g/dL — ABNORMAL LOW (ref 3.5–5.0)
Alkaline Phosphatase: 172 U/L — ABNORMAL HIGH (ref 38–126)
Anion gap: 7 (ref 5–15)
BUN: 36 mg/dL — ABNORMAL HIGH (ref 8–23)
CO2: 25 mmol/L (ref 22–32)
Calcium: 8.8 mg/dL — ABNORMAL LOW (ref 8.9–10.3)
Chloride: 100 mmol/L (ref 98–111)
Creatinine, Ser: 1.37 mg/dL — ABNORMAL HIGH (ref 0.61–1.24)
GFR, Estimated: 55 mL/min — ABNORMAL LOW (ref >60)
Glucose, Bld: 100 mg/dL — ABNORMAL HIGH (ref 70–99)
Potassium: 3.5 mmol/L (ref 3.5–5.1)
Sodium: 132 mmol/L — ABNORMAL LOW (ref 135–145)
Total Bilirubin: 2.1 mg/dL — ABNORMAL HIGH (ref 0.3–1.2)
Total Protein: 4.7 g/dL — ABNORMAL LOW (ref 6.5–8.1)

## 2020-05-07 LAB — HEPATIC FUNCTION PANEL
ALT: 689 U/L — ABNORMAL HIGH (ref 0–44)
AST: 327 U/L — ABNORMAL HIGH (ref 15–41)
Albumin: 2.7 g/dL — ABNORMAL LOW (ref 3.5–5.0)
Alkaline Phosphatase: 170 U/L — ABNORMAL HIGH (ref 38–126)
Bilirubin, Direct: 0.7 mg/dL — ABNORMAL HIGH (ref 0.0–0.2)
Indirect Bilirubin: 1 mg/dL — ABNORMAL HIGH (ref 0.3–0.9)
Total Bilirubin: 1.7 mg/dL — ABNORMAL HIGH (ref 0.3–1.2)
Total Protein: 4.6 g/dL — ABNORMAL LOW (ref 6.5–8.1)

## 2020-05-07 LAB — RETICULOCYTES
Immature Retic Fract: 4.3 % (ref 2.3–15.9)
RBC.: 2.3 MIL/uL — ABNORMAL LOW (ref 4.22–5.81)
Retic Count, Absolute: 12 10*3/uL — ABNORMAL LOW (ref 19.0–186.0)
Retic Ct Pct: 0.5 % (ref 0.4–3.1)

## 2020-05-07 LAB — PREPARE RBC (CROSSMATCH)

## 2020-05-07 LAB — URIC ACID: Uric Acid, Serum: 6.3 mg/dL (ref 3.7–8.6)

## 2020-05-07 MED ORDER — FUROSEMIDE 10 MG/ML IJ SOLN
10.0000 mg | Freq: Once | INTRAMUSCULAR | Status: AC
  Administered 2020-05-07: 10 mg via INTRAVENOUS
  Filled 2020-05-07: qty 2

## 2020-05-07 MED ORDER — CIPROFLOXACIN HCL 500 MG PO TABS: 500.0000 mg | ORAL_TABLET | Freq: Two times a day (BID) | ORAL | 0 refills | Status: DC

## 2020-05-07 MED ORDER — MAGNESIUM HYDROXIDE 400 MG/5ML PO SUSP: 30.0000 mL | Freq: Every day | ORAL | 0 refills | Status: DC | PRN

## 2020-05-07 MED ORDER — SENNOSIDES-DOCUSATE SODIUM 8.6-50 MG PO TABS: 2.0000 | ORAL_TABLET | Freq: Two times a day (BID) | ORAL | Status: DC | PRN

## 2020-05-07 MED ORDER — CIPROFLOXACIN HCL 500 MG PO TABS: 500.0000 mg | ORAL_TABLET | Freq: Two times a day (BID) | ORAL | Status: DC

## 2020-05-07 MED ORDER — FUROSEMIDE 10 MG/ML IJ SOLN: 20.0000 mg | Freq: Once | INTRAMUSCULAR | Status: DC

## 2020-05-07 MED ORDER — SIMETHICONE 80 MG PO CHEW: 80.0000 mg | CHEWABLE_TABLET | Freq: Four times a day (QID) | ORAL | 0 refills | Status: DC | PRN

## 2020-05-07 MED ORDER — SODIUM BICARBONATE/SODIUM CHLORIDE MOUTHWASH: 1.0000 "application " | OROMUCOSAL | Status: DC | PRN

## 2020-05-07 MED ORDER — SODIUM CHLORIDE 0.9% IV SOLUTION: Freq: Once | INTRAVENOUS | Status: AC

## 2020-05-07 MED ORDER — ENSURE ENLIVE PO LIQD
237.0000 mL | Freq: Two times a day (BID) | ORAL | 12 refills | Status: DC
Start: 2020-05-07 — End: 2020-05-15

## 2020-05-07 MED ORDER — HYDROCODONE-ACETAMINOPHEN 5-325 MG PO TABS: 1.0000 | ORAL_TABLET | Freq: Four times a day (QID) | ORAL | 0 refills | Status: DC | PRN

## 2020-05-07 MED ORDER — METRONIDAZOLE 500 MG PO TABS: 500.0000 mg | ORAL_TABLET | Freq: Three times a day (TID) | ORAL | 0 refills | Status: DC

## 2020-05-07 MED ORDER — ONDANSETRON HCL 8 MG PO TABS: 8.0000 mg | ORAL_TABLET | Freq: Three times a day (TID) | ORAL | 0 refills | Status: DC | PRN

## 2020-05-07 MED ORDER — DIPHENHYDRAMINE HCL 25 MG PO CAPS: 25.0000 mg | ORAL_CAPSULE | Freq: Every evening | ORAL | 0 refills | Status: DC | PRN

## 2020-05-07 MED ORDER — PROCHLORPERAZINE MALEATE 10 MG PO TABS: 10.0000 mg | ORAL_TABLET | Freq: Four times a day (QID) | ORAL | 0 refills | Status: DC | PRN

## 2020-05-07 MED ORDER — LACTULOSE 10 GM/15ML PO SOLN: 20.0000 g | Freq: Two times a day (BID) | ORAL | 0 refills | Status: DC | PRN

## 2020-05-07 NOTE — Discharge Summary (Addendum)
Discharge Summary  Patient ID: John [name] MRN: [id number] DOB/AGE: 18-Jul-1945 74 y.o.  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[place])   Goals of care, counseling/discussion   Discharged Condition: stable  Discharge Labs:   CBC    Component Value Date/Time   WBC 4.4 05/07/2020 0446   RBC 2.30 (L) 05/07/2020 0446   RBC 2.30 (L) 05/07/2020 0446   HGB 6.9 (LL) 05/07/2020 0446   HGB 9.4 (L) 04/30/2020 1206   HCT 19.9 (L) 05/07/2020 0446   PLT 53 (L) 05/07/2020 0446   PLT 184 04/30/2020 1206   MCV 86.5 05/07/2020 0446   MCH 30.0 05/07/2020 0446   MCHC 34.7 05/07/2020 0446   RDW 14.0 05/07/2020 0446   LYMPHSABS 0.0 (L) 05/07/2020 0446   MONOABS 0.0 (L) 05/07/2020 0446   EOSABS 0.1 05/07/2020 0446   BASOSABS 0.0 05/07/2020 0446   CMP Latest Ref Rng & Units 05/07/2020 05/06/2020 05/05/2020  Glucose 70 - 99 mg/dL 100(H) 129(H) 92  BUN 8 - 23 mg/dL 36(H) 44(H) 35(H)  Creatinine 0.61 - 1.24 mg/dL 1.37(H) 1.44(H) 1.41(H)  Sodium 135 - 145 mmol/L 132(L) 134(L) 136  Potassium 3.5 - 5.1 mmol/L 3.5 3.2(L) 3.3(L)  Chloride 98 - 111 mmol/L 100 100 104  CO2 22 - 32 mmol/L 25 24 23   Calcium 8.9 - 10.3 mg/dL 8.8(L) 9.3 8.9  Total Protein 6.5 - 8.1 g/dL 4.7(L) 5.3(L) 4.7(L)  Total Bilirubin 0.3 - 1.2 mg/dL 2.1(H) 1.6(H) 1.3(H)  Alkaline Phos 38 - 126 U/L 172(H) 116 95  AST 15 - 41 U/L 324(H) 285(H) 130(H)  ALT 0 - 44 U/L 703(H) 351(H) 207(H)     Significant Diagnostic Studies:  06/05/2020-CT of the abdomen/pelvis with contrast IMPRESSION: 1. Mild improvement in the adenopathy in the abdomen and pelvis. 2. Mild reduction in size of the complex ascites compatible with hemoperitoneum. 3. Large bilobed mass in the liver  with internal heterogeneous high density probably from internal hematoma, difficult to compare in size to the prior exam due to low conspicuity on the prior exam. 4. Stable appearance of the mesenteric mass along the inferior margin of the head of the pancreas. 5. Multiple splenic masses, some of which are similar to prior, difficult to compare in size due to low conspicuity on prior noncontrast exam. 6. Air-levels in the distal colon favoring diarrheal process. 7. Sigmoid colon diverticulosis. There is stranding along the sigmoid colon such that low-grade diverticulitis cannot be readily excluded. 8. Small left and trace right pleural effusions. 9. Prostatomegaly. 10. Multilevel lumbar impingement. 11. Small right adrenal nodule, probably an adenoma based on prior imaging but technically nonspecific on today's exam. 12. 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 13. Mesenteric and subcutaneous edema compatible with third spacing of fluid. 14. Aortic atherosclerosis.  Consults: None  Procedures: None  Disposition:  Discharge disposition: 01-Home or Self Care      Allergies as of 05/07/2020   No Known Allergies     Medication List    STOP taking these medications   CALCIUM CITRATE + D PO   ibuprofen 200 MG tablet Commonly known as: ADVIL   predniSONE 20 MG  tablet Commonly known as: DELTASONE   PROSTATE PO     TAKE these medications   allopurinol 100 MG tablet Commonly known as: Zyloprim Take 1 tablet (100 mg total) by mouth daily.   aspirin EC 81 MG tablet Take 81 mg by mouth daily.   ciprofloxacin 500 MG tablet Commonly known as: CIPRO Take 1 tablet (500 mg total) by mouth 2 (two) times daily for 7 days.   clobetasol ointment 0.05 % Commonly known as: TEMOVATE Apply 1 application topically 2 (two) times daily as needed  (eczema).   diphenhydrAMINE 25 mg capsule Commonly known as: BENADRYL Take 1 capsule (25 mg total) by mouth at bedtime as needed for sleep.   docusate sodium 100 MG capsule Commonly known as: COLACE Take 100 mg by mouth 2 (two) times daily as needed for mild constipation.   doxazosin 4 MG tablet Commonly known as: CARDURA Take 4 mg by mouth daily.   feeding supplement Liqd Take 237 mLs by mouth 2 (two) times daily between meals.   HYDROcodone-acetaminophen 5-325 MG tablet Commonly known as: NORCO/VICODIN Take 1 tablet by mouth every 6 (six) hours as needed for moderate pain.   lactulose 10 GM/15ML solution Commonly known as: CHRONULAC Take 30 mLs (20 g total) by mouth 2 (two) times daily as needed for mild constipation or moderate constipation.   magnesium hydroxide 400 MG/5ML suspension Commonly known as: MILK OF MAGNESIA Take 30 mLs by mouth daily as needed for mild constipation.   metoprolol tartrate 25 MG tablet Commonly known as: LOPRESSOR Take 0.5 tablets (12.5 mg total) by mouth 2 (two) times daily.   metroNIDAZOLE 500 MG tablet Commonly known as: FLAGYL Take 1 tablet (500 mg total) by mouth every 8 (eight) hours for 7 days.   MULTIVITAMIN PO Take 1 tablet by mouth daily.   OMEGA 3 PO Take 1 tablet by mouth daily.   ondansetron 8 MG tablet Commonly known as: ZOFRAN Take 1 tablet (8 mg total) by mouth every 8 (eight) hours as needed for nausea or vomiting.   pantoprazole 40 MG tablet Commonly known as: Protonix Take 1 tablet (40 mg total) by mouth daily.   polyethylene glycol powder 17 GM/SCOOP powder Commonly known as: GLYCOLAX/MIRALAX Take 17 g by mouth daily as needed for mild constipation.   prochlorperazine 10 MG tablet Commonly known as: COMPAZINE Take 1 tablet (10 mg total) by mouth every 6 (six) hours as needed for nausea or vomiting.   senna-docusate 8.6-50 MG tablet Commonly known as: Senokot-S Take 2 tablets by mouth 2 (two) times daily as  needed for mild constipation.   simethicone 80 MG chewable tablet Commonly known as: MYLICON Chew 1 tablet (80 mg total) by mouth 4 (four) times daily as needed for flatulence.   sodium bicarbonate/sodium chloride Soln 1 application by Mouth Rinse route as needed for dry mouth or mouth pain.   triamcinolone 0.1 % Commonly known as: KENALOG Apply 1 application topically 3 (three) times daily as needed for rash.   vitamin C with rose hips 1000 MG tablet Take 1,000 mg by mouth daily.       HPI: Mr. Wixom a 74year old Caucasian male, with past medical history of hypertension, BPH.  He was admitted to [facility] earlier this month due to fatigue and constipation.  He was found to have severe hypercalcemia and CT scan showed a large mediastinal and liver mass with diffuse retroperitoneal lymphadenopathy.  He received IV fluids and pamidronate for treatment of hypercalcemia with  resolution.  He also had hyperuricemia and was treated with rasburicase and then placed on allopurinol.  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  The patient was discharged home with a course of prednisone x5 days.    He was seen for outpatient follow-up to discuss biopsy results and treatment recommendations. It was recommended him to begin inpatient EPOCH-R for treatment of his lymphoma.    The patient was set up for planned admission to the hospital to begin cycle #1 of EPOCH-R.  Hospital Course: The patient was seen on the day of admission and reported that he was feeling better.  He still has generalized fatigue but appetite was improving.  He was not having any fevers, chills, headaches, dizziness, mucositis, chest pain, shortness of breath, cough, abdominal pain, nausea, vomiting.  He had some baseline constipation.  No bleeding was reported.  He reported some trace bilateral lower extremity edema which  was chronic for him.  The patient had a PICC line placed on the morning of admission for chemotherapy.  The patient overall tolerated his chemotherapy well.  He developed some abdominal discomfort, distention, and constipation.  He also had rising LFTs and T bili.  He initially had an abdominal x-ray performed on 05/03/2020 which showed nonobstructive bowel gas pattern.  The patient had some improvement of his symptoms with change in his bowel regimen.  However, due to ongoing symptoms, he had a CT of the abdomen/pelvis which was performed on 05/06/2020 which showed some improvement in his lymphadenopathy and no acute findings.  He was started empirically on IV Cipro and Flagyl.  He did not have any fevers and WBC was not elevated.  The patient was started on lactulose with significant improvement in his abdominal discomfort, distention, and constipation.  On the day of discharge, the patient's hemoglobin had dropped to 6.9.  2 units PRBCs were ordered.  Reticulocytes were not elevated.  Indirect bilirubin was only mildly elevated at 1.0.  There was no evidence of hemolysis.  Abnormal liver function likely due to chemotherapy.  The patient was otherwise stable for discharge and wanted to go home.  The patient was discharged home on 05/07/2020 once blood transfusion is complete and PICC line has been removed.  Scheduling message has been sent to arrange for Neulasta injection on 05/08/2020 in for a follow-up visit on the morning of 05/10/2020.   Discharge Instructions    Activity as tolerated - No restrictions   Complete by: As directed    Diet general   Complete by: As directed       Signed: Mikey [name] 05/07/2020, 9:39 AM   I have seen the patient, examined him. I agree with the assessment and and summary. F/u appointments were arranged before discharge. Will also arrange his PET scan, IT chemo and port placement before next cycle chemo.    Truitt Merle  05/07/2020

## 2020-05-07 NOTE — Progress Notes (Signed)
CRITICAL VALUE ALERT  Critical Value:  Hgb 6.9  Date & Time Notified: 11/29 4142  Provider Notified: Dr. Marin Olp  Orders Received/Actions taken: MD will write for 2 units PRBC when he arrives this morning.

## 2020-05-07 NOTE — Care Management Important Message (Signed)
Important Message  Patient Details IM Letter given to the Patient. Name: John [name] MRN: [id number] Date of Birth: 1945/12/13   Medicare Important Message Given:  Yes     Kerin Salen 05/07/2020, 9:09 AM

## 2020-05-08 ENCOUNTER — Inpatient Hospital Stay: Payer: Medicare HMO

## 2020-05-08 ENCOUNTER — Other Ambulatory Visit: Payer: Self-pay

## 2020-05-08 ENCOUNTER — Other Ambulatory Visit: Payer: Self-pay | Admitting: Nurse Practitioner

## 2020-05-08 VITALS — BP 138/54 | HR 96 | Resp 18

## 2020-05-08 DIAGNOSIS — C8339 Diffuse large B-cell lymphoma, extranodal and solid organ sites: Secondary | ICD-10-CM

## 2020-05-08 DIAGNOSIS — C8330 Diffuse large B-cell lymphoma, unspecified site: Secondary | ICD-10-CM

## 2020-05-08 DIAGNOSIS — D649 Anemia, unspecified: Secondary | ICD-10-CM | POA: Diagnosis not present

## 2020-05-08 DIAGNOSIS — Z5189 Encounter for other specified aftercare: Secondary | ICD-10-CM | POA: Diagnosis present

## 2020-05-08 DIAGNOSIS — E883 Tumor lysis syndrome: Secondary | ICD-10-CM | POA: Diagnosis not present

## 2020-05-08 DIAGNOSIS — N179 Acute kidney failure, unspecified: Secondary | ICD-10-CM | POA: Diagnosis not present

## 2020-05-08 LAB — BPAM RBC
Blood Product Expiration Date: 202112102359
Blood Product Expiration Date: 202112112359
ISSUE DATE / TIME: 202111291019
ISSUE DATE / TIME: 202111291349
Unit Type and Rh: 6200
Unit Type and Rh: 6200

## 2020-05-08 LAB — TYPE AND SCREEN
ABO/RH(D): A POS
Antibody Screen: NEGATIVE
Unit division: 0
Unit division: 0

## 2020-05-08 MED ORDER — PEGFILGRASTIM-CBQV 6 MG/0.6ML ~~LOC~~ SOSY
6.0000 mg | PREFILLED_SYRINGE | Freq: Once | SUBCUTANEOUS | Status: AC
  Administered 2020-05-08: 6 mg via SUBCUTANEOUS

## 2020-05-08 MED ORDER — PEGFILGRASTIM-CBQV 6 MG/0.6ML ~~LOC~~ SOSY
PREFILLED_SYRINGE | SUBCUTANEOUS | Status: AC
  Filled 2020-05-08: qty 0.6

## 2020-05-08 NOTE — Patient Instructions (Signed)

## 2020-05-09 ENCOUNTER — Other Ambulatory Visit: Payer: Self-pay

## 2020-05-09 DIAGNOSIS — C8339 Diffuse large B-cell lymphoma, extranodal and solid organ sites: Secondary | ICD-10-CM

## 2020-05-10 ENCOUNTER — Other Ambulatory Visit: Payer: Self-pay

## 2020-05-10 ENCOUNTER — Other Ambulatory Visit: Payer: Medicare HMO

## 2020-05-10 ENCOUNTER — Encounter: Payer: Self-pay | Admitting: Nurse Practitioner

## 2020-05-10 ENCOUNTER — Inpatient Hospital Stay: Payer: Medicare HMO | Attending: Hematology

## 2020-05-10 ENCOUNTER — Ambulatory Visit: Payer: Medicare HMO | Admitting: Hematology

## 2020-05-10 ENCOUNTER — Inpatient Hospital Stay (HOSPITAL_BASED_OUTPATIENT_CLINIC_OR_DEPARTMENT_OTHER): Payer: Medicare HMO | Admitting: Nurse Practitioner

## 2020-05-10 VITALS — BP 143/56 | HR 97 | Temp 97.9°F | Resp 17 | Ht 71.0 in | Wt 221.7 lb

## 2020-05-10 DIAGNOSIS — Z5112 Encounter for antineoplastic immunotherapy: Secondary | ICD-10-CM | POA: Insufficient documentation

## 2020-05-10 DIAGNOSIS — D649 Anemia, unspecified: Secondary | ICD-10-CM

## 2020-05-10 DIAGNOSIS — C8339 Diffuse large B-cell lymphoma, extranodal and solid organ sites: Secondary | ICD-10-CM | POA: Diagnosis not present

## 2020-05-10 DIAGNOSIS — A419 Sepsis, unspecified organism: Secondary | ICD-10-CM | POA: Diagnosis not present

## 2020-05-10 DIAGNOSIS — E883 Tumor lysis syndrome: Secondary | ICD-10-CM | POA: Insufficient documentation

## 2020-05-10 DIAGNOSIS — K5900 Constipation, unspecified: Secondary | ICD-10-CM | POA: Insufficient documentation

## 2020-05-10 DIAGNOSIS — D696 Thrombocytopenia, unspecified: Secondary | ICD-10-CM

## 2020-05-10 DIAGNOSIS — D709 Neutropenia, unspecified: Secondary | ICD-10-CM | POA: Diagnosis not present

## 2020-05-10 DIAGNOSIS — N179 Acute kidney failure, unspecified: Secondary | ICD-10-CM | POA: Insufficient documentation

## 2020-05-10 DIAGNOSIS — Z5189 Encounter for other specified aftercare: Secondary | ICD-10-CM | POA: Insufficient documentation

## 2020-05-10 LAB — CBC WITH DIFFERENTIAL (CANCER CENTER ONLY)
Abs Immature Granulocytes: 0 10*3/uL (ref 0.00–0.07)
Band Neutrophils: 1 %
Basophils Absolute: 0 10*3/uL (ref 0.0–0.1)
Basophils Relative: 3 %
Eosinophils Absolute: 0 10*3/uL (ref 0.0–0.5)
Eosinophils Relative: 3 %
HCT: 22.1 % — ABNORMAL LOW (ref 39.0–52.0)
Hemoglobin: 7.4 g/dL — ABNORMAL LOW (ref 13.0–17.0)
Lymphocytes Relative: 10 %
Lymphs Abs: 0.1 10*3/uL — ABNORMAL LOW (ref 0.7–4.0)
MCH: 29 pg (ref 26.0–34.0)
MCHC: 33.5 g/dL (ref 30.0–36.0)
MCV: 86.7 fL (ref 80.0–100.0)
Metamyelocytes Relative: 1 %
Monocytes Absolute: 0 10*3/uL — ABNORMAL LOW (ref 0.1–1.0)
Monocytes Relative: 1 %
Myelocytes: 1 %
Neutro Abs: 0.5 10*3/uL — ABNORMAL LOW (ref 1.7–7.7)
Neutrophils Relative %: 80 %
Platelet Count: 22 10*3/uL — ABNORMAL LOW (ref 150–400)
RBC: 2.55 MIL/uL — ABNORMAL LOW (ref 4.22–5.81)
RDW: 14.6 % (ref 11.5–15.5)
WBC Count: 0.6 10*3/uL — CL (ref 4.0–10.5)
nRBC: 0 % (ref 0.0–0.2)

## 2020-05-10 LAB — CMP (CANCER CENTER ONLY)
ALT: 208 U/L — ABNORMAL HIGH (ref 0–44)
AST: 27 U/L (ref 15–41)
Albumin: 2.7 g/dL — ABNORMAL LOW (ref 3.5–5.0)
Alkaline Phosphatase: 178 U/L — ABNORMAL HIGH (ref 38–126)
Anion gap: 8 (ref 5–15)
BUN: 32 mg/dL — ABNORMAL HIGH (ref 8–23)
CO2: 24 mmol/L (ref 22–32)
Calcium: 8.7 mg/dL — ABNORMAL LOW (ref 8.9–10.3)
Chloride: 102 mmol/L (ref 98–111)
Creatinine: 1.3 mg/dL — ABNORMAL HIGH (ref 0.61–1.24)
GFR, Estimated: 58 mL/min — ABNORMAL LOW (ref >60)
Glucose, Bld: 144 mg/dL — ABNORMAL HIGH (ref 70–99)
Potassium: 3.9 mmol/L (ref 3.5–5.1)
Sodium: 134 mmol/L — ABNORMAL LOW (ref 135–145)
Total Bilirubin: 1.3 mg/dL — ABNORMAL HIGH (ref 0.3–1.2)
Total Protein: 4.9 g/dL — ABNORMAL LOW (ref 6.5–8.1)

## 2020-05-10 LAB — URIC ACID: Uric Acid, Serum: 4.8 mg/dL (ref 3.7–8.6)

## 2020-05-10 LAB — LACTATE DEHYDROGENASE: LDH: 333 U/L — ABNORMAL HIGH (ref 98–192)

## 2020-05-10 LAB — PREPARE RBC (CROSSMATCH)

## 2020-05-10 LAB — SAMPLE TO BLOOD BANK

## 2020-05-10 NOTE — Progress Notes (Signed)
[name]   [phone] [phone]   Clinic Follow up Note   Patient Care Team: John Sacramento, MD as PCP - General (Family Medicine) 05/10/2020  CHIEF COMPLAINT: Follow up [facility]  SUMMARY OF ONCOLOGIC HISTORY: Oncology History Overview Note  Cancer Staging Diffuse large B cell lymphoma (John [name]) Staging form: Hodgkin and Non-Hodgkin Lymphoma, AJCC 8th Edition - Clinical stage from 04/23/2020: Stage IV (Diffuse large B-cell lymphoma) - Signed by John Merle, MD on 04/29/2020    Diffuse large B cell lymphoma (John [name])  04/20/2020 Imaging   CT CAP  IMPRESSION: 1. Large anterior mediastinal/prevascular space mass/adenopathy. The mass extends superiorly and abuts the upper trachea. 2. Large hypodense lesion in the right lobe of the liver as well as ill-defined splenic hypodense lesions. Further characterization with MRI without and with contrast recommended. The liver lesion is amenable to percutaneous tissue sampling. 3. Extensive mesenteric and retroperitoneal adenopathy. 4. Sigmoid diverticulosis. No bowel obstruction. Normal appendix. 5. Aortic Atherosclerosis (ICD10-I70.0).   04/23/2020 Cancer Staging   Staging form: Hodgkin and Non-Hodgkin Lymphoma, AJCC 8th Edition - Clinical stage from 04/23/2020: Stage IV (Diffuse large B-cell lymphoma) - Signed by John Merle, MD on 04/29/2020   04/23/2020 Initial Biopsy   FINAL MICROSCOPIC DIAGNOSIS:   A. LIVER, RIGHT MASS, NEEDLE CORE BIOPSY:  - Large B-cell lymphoma  - See comment     COMMENT:   The sections show needle core biopsy fragments displaying effacement of  the architecture by a dense infiltrate of primarily large atypical  lymphoid appearing cells displaying vesicular chromatin and small  nucleoli.  This is associated with apoptosis and brisk mitosis.  The  appearance is diffuse with lack of atypical follicles.  A large battery  of immunohistochemical stains was performed and shows that  the atypical  lymphoid cells are positive for LCA, CD20, PAX 5, BCL-2, BCL6, CD5,  MUM-1 (partial).  The atypical lymphoid cells are negative for CD10,  CD30, CD34, CD138, cyclin D1, TdT, and EBV in situ hybridization,  synaptophysin, cytokeratin AE1/AE3, cytokeratin 20, cytokeratin 7,  cytokeratin 5/6, TTF-1, CD56, CDX2, and p40.  There is an admixed  relatively minor population of T-cells as primarily seen with CD3.  The  overall features are consistent with large B-cell lymphoma, ABC subtype.  The lymphomatous process displays expression of CD5.  This phenotype is  seen in 5-10% of mostly de novo large B-cell lymphoma cases or rarely as  a transformation of a low-grade B-cell lymphoma such as small  lymphocytic lymphoma.  Clinical correlation is recommended.    04/25/2020 Imaging   CT AP  IMPRESSION: 1. Development of high-density ascites in the abdomen and pelvis. Findings are most compatible with a small to moderate amount of hemoperitoneum and related to the recent liver biopsy. 2. Extensive lymphadenopathy throughout the abdomen and pelvis with a large mass or nodal mass near the pancreatic head. There are additional lesions involving the liver and spleen. Findings are suggestive for metastatic disease. 3. Development of small bilateral pleural effusions.   These results were called by telephone at the time of interpretation on 04/25/2020 at 1:43 pm to provider John [place] , who verbally acknowledged these results.   04/26/2020 Initial Diagnosis   High grade non-Hodgkin lymphoma (John [name])   05/01/2020 -  Chemotherapy   EPOCH-R every 2-3 weeks for 6 cycles starting 05/01/20 with intrathecal prophylactic treatment to brain.    05/01/2020 -  Chemotherapy   The patient had pegfilgrastim-cbqv (UDENYCA) injection 6 mg, 6 mg,  Subcutaneous, Once, 1 of 4 cycles Administration: 6 mg (05/08/2020) DOXOrubicin (ADRIAMYCIN) 22 mg, etoposide (VEPESID) 84 mg, vinCRIStine (ONCOVIN) 0.9 mg in  sodium chloride 0.9 % 1[name]000 mL chemo infusion, , Intravenous, Once, 1 of 4 cycles Administration:  (05/01/2020),  (05/02/2020),  (05/03/2020),  (05/04/2020) ondansetron (ZOFRAN) 8 mg, dexamethasone (DECADRON) 10 mg in sodium chloride 0.9 % 50 mL IVPB, , Intravenous,  Once, 1 of 4 cycles Administration: 18 mg (05/01/2020), 36 mg (05/05/2020), 18 mg (05/03/2020), 18 mg (05/04/2020) methotrexate (PF) 12 mg, hydrocortisone sodium succinate (SOLU-CORTEF) 50 mg in sodium chloride (PF) 0.9 % INTRATHECAL chemo injection, , Intrathecal,  Once, 1 of 1 cycle cyclophosphamide (CYTOXAN) 1[name]680 mg in sodium chloride 0.9 % 250 mL chemo infusion, 750 mg/m2 = 1[name]680 mg, Intravenous,  Once, 1 of 4 cycles Administration: 1[name]680 mg (05/05/2020) riTUXimab-pvvr (RUXIENCE) 800 mg in sodium chloride 0.9 % 250 mL (2.4242 mg/mL) infusion, 375 mg/m2 = 800 mg, Intravenous,  Once, 1 of 4 cycles Administration: 800 mg (05/04/2020)  for chemotherapy treatment.      CURRENT THERAPY: R-EPOCH q21 days, starting 05/01/20   INTERVAL HISTORY: John [name] returns with his spouse for follow up has scheduled. He was recently admitted for [place]. He tolerated chemo. He developed abdominal pain/distention, constipation and elevated LFTs/bili. CT 11/28 showed some improvement of his lymphadenopathy, no acute process. He was started empirically on cipro and flagyl. Ultimately improved with lactulose. He was transfused for anemia of 6.9 prior to discharge. He received neulasta as an outpatient on 11/30.  He has minor shoulder, back, and rib aches after injection.  He feels weak, tired and "shaky" in general.  He is mostly resting throughout the day but still up at home.  He has exertional dyspnea, no cough or chest pain.  Denies any fever, chills.  He has a small mouth sore on the lower lip, using mouth rinse.  Not limiting p.o.  Has swelling in the legs and feet, no Lasix since discharge.  No calf pain.  Denies any bleeding/bruising.  Has not  needed antiemetics.  Using MiraLAX and Colace for constipation.  Still on Cipro and Flagyl.  Denies dysuria.   MEDICAL HISTORY:  Past Medical History:  Diagnosis Date  . Cancer ([place])   . Goals of care, counseling/discussion 05/06/2020  . Hypertension   . Knee pain, right     SURGICAL HISTORY: Past Surgical History:  Procedure Laterality Date  . FOOT SURGERY    . TONSILLECTOMY      I have reviewed the social history and family history with the patient and they are unchanged from previous note.  ALLERGIES:  has No Known Allergies.  MEDICATIONS:  Current Outpatient Medications  Medication Sig Dispense Refill  . allopurinol (ZYLOPRIM) 100 MG tablet Take 1 tablet (100 mg total) by mouth daily. 30 tablet 0  . Ascorbic Acid (VITAMIN C WITH ROSE HIPS) 1000 MG tablet Take 1[name]000 mg by mouth daily.    Marland Kitchen aspirin EC 81 MG tablet Take 81 mg by mouth daily.    . ciprofloxacin (CIPRO) 500 MG tablet Take 1 tablet (500 mg total) by mouth 2 (two) times daily for 7 days. 14 tablet 0  . clobetasol ointment (TEMOVATE) 6.72 % Apply 1 application topically 2 (two) times daily as needed (eczema).     . diphenhydrAMINE (BENADRYL) 25 mg capsule Take 1 capsule (25 mg total) by mouth at bedtime as needed for sleep. 30 capsule 0  . docusate sodium (COLACE) 100 MG capsule Take 100 mg by  mouth 2 (two) times daily as needed for mild constipation.    Marland Kitchen doxazosin (CARDURA) 4 MG tablet Take 4 mg by mouth daily.    . feeding supplement (ENSURE ENLIVE / ENSURE PLUS) LIQD Take 237 mLs by mouth 2 (two) times daily between meals. 237 mL 12  . HYDROcodone-acetaminophen (NORCO/VICODIN) 5-325 MG tablet Take 1 tablet by mouth every 6 (six) hours as needed for moderate pain. 30 tablet 0  . lactulose (CHRONULAC) 10 GM/15ML solution Take 30 mLs (20 g total) by mouth 2 (two) times daily as needed for mild constipation or moderate constipation. 236 mL 0  . magnesium hydroxide (MILK OF MAGNESIA) 400 MG/5ML suspension Take 30 mLs  by mouth daily as needed for mild constipation. 355 mL 0  . metoprolol tartrate (LOPRESSOR) 25 MG tablet Take 0.5 tablets (12.5 mg total) by mouth 2 (two) times daily. 30 tablet 0  . metroNIDAZOLE (FLAGYL) 500 MG tablet Take 1 tablet (500 mg total) by mouth every 8 (eight) hours for 7 days. 21 tablet 0  . Multiple Vitamins-Minerals (MULTIVITAMIN PO) Take 1 tablet by mouth daily.    . Omega-3 Fatty Acids (OMEGA 3 PO) Take 1 tablet by mouth daily.    . ondansetron (ZOFRAN) 8 MG tablet Take 1 tablet (8 mg total) by mouth every 8 (eight) hours as needed for nausea or vomiting. 20 tablet 0  . pantoprazole (PROTONIX) 40 MG tablet Take 1 tablet (40 mg total) by mouth daily. 30 tablet 11  . polyethylene glycol powder (GLYCOLAX/MIRALAX) 17 GM/SCOOP powder Take 17 g by mouth daily as needed for mild constipation.     . prochlorperazine (COMPAZINE) 10 MG tablet Take 1 tablet (10 mg total) by mouth every 6 (six) hours as needed for nausea or vomiting. 30 tablet 0  . senna-docusate (SENOKOT-S) 8.6-50 MG tablet Take 2 tablets by mouth 2 (two) times daily as needed for mild constipation.    . simethicone (MYLICON) 80 MG chewable tablet Chew 1 tablet (80 mg total) by mouth 4 (four) times daily as needed for flatulence. 30 tablet 0  . Sodium Chloride-Sodium Bicarb (SODIUM BICARBONATE/SODIUM CHLORIDE) SOLN 1 application by Mouth Rinse route as needed for dry mouth or mouth pain.    Marland Kitchen triamcinolone cream (KENALOG) 0.1 % Apply 1 application topically 3 (three) times daily as needed for rash.     No current facility-administered medications for this visit.    PHYSICAL EXAMINATION: ECOG PERFORMANCE STATUS: 1-2  Vitals:   05/10/20 1035  BP: (!) 143/56  Pulse: 97  Resp: 17  Temp: 97.9 F (36.6 C)  SpO2: 100%   Filed Weights   05/10/20 1035  Weight: 221 lb 11.2 oz (100.6 kg)    GENERAL:alert, no distress and comfortable SKIN: No rash or petechiae.  Diffuse ecchymoses in the lower abdomen/pelvis EYES:   sclera clear OROPHARYNX: No thrush or buccal petechiae.  One small oral ulcer to the lower lip LUNGS: clear,  normal breathing effort HEART: regular rate & rhythm, mild to moderate bilateral lower extremity edema ABDOMEN:abdomen soft, non-tender and normal bowel sounds NEURO: alert & oriented x 3 with fluent speech  LABORATORY DATA:  I have reviewed the data as listed CBC Latest Ref Rng & Units 05/10/2020 05/07/2020 05/06/2020  WBC 4.0 - 10.5 K/uL 0.6(LL) 4.4 5.5  Hemoglobin 13.0 - 17.0 g/dL 7.4(L) 6.9(LL) 8.3(L)  Hematocrit 39 - 52 % 22.1(L) 19.9(L) 23.7(L)  Platelets 150 - 400 K/uL 22(L) 53(L) 85(L)     CMP Latest Ref Rng &  Units 05/10/2020 05/07/2020 05/07/2020  Glucose 70 - 99 mg/dL 144(H) - 100(H)  BUN 8 - 23 mg/dL 32(H) - 36(H)  Creatinine 0.61 - 1.24 mg/dL 1.30(H) - 1.37(H)  Sodium 135 - 145 mmol/L 134(L) - 132(L)  Potassium 3.5 - 5.1 mmol/L 3.9 - 3.5  Chloride 98 - 111 mmol/L 102 - 100  CO2 22 - 32 mmol/L 24 - 25  Calcium 8.9 - 10.3 mg/dL 8.7(L) - 8.8(L)  Total Protein 6.5 - 8.1 g/dL 4.9(L) 4.6(L) 4.7(L)  Total Bilirubin 0.3 - 1.2 mg/dL 1.3(H) 1.7(H) 2.1(H)  Alkaline Phos 38 - 126 U/L 178(H) 170(H) 172(H)  AST 15 - 41 U/L 27 327(H) 324(H)  ALT 0 - 44 U/L 208(H) 689(H) 703(H)      RADIOGRAPHIC STUDIES: I have personally reviewed the radiological images as listed and agreed with the findings in the report. No results found.   ASSESSMENT & PLAN: John [name] is a 74 y.o. male with    1. Diffuse large B cell lymphoma, ABC Subtype, stage IV  -After worsening fatigue pt presented to ED. 04/20/20 CT CAP showed large mediastinal massand diffuse liver masses, and diffuse retroperitoneal lymphadenopathy. 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 Given his liver metastasis and LN involvement, this is stage IV. His lymphoma FISH panel is still pending to rule out double hit lymphoma  -He consented to  first-line more intensive EPOCH-R q. 21 days x 6 cycles with plan to restage PET scan after cycles 3 and 6 -He is also at high risk for CNS involvement due to his high risk features (advanced age, elevated LDH, stage IV with extranodal metastasis).   We plan to add IT methotrexate next week prophylactically  -S/p cycle 1 day 1-5 from 11/23-11/27 with G-CSF on 11/30, tolerated well -Pending port placement, PET scan, and IT methotrexate  2.  High risk for tumor lysis -Cr 1.75 on 11/22, s/p rasburicase in the hospital -No signs of TLS today  Disposition: Mr. Foot appears stable.  He is s/p cycle 1 day 9 R-EPOCH with G-CSF on 11/30.  He has tolerated chemo well thus far, with mild constipation and mucositis.  Side effects are well managed with supportive meds at home.   We reviewed his labs.  CMP shows improved renal and liver function.  No evidence of TLS. LDH has improved. CBC shows pancytopenia, WBC 0.6 ANC 0.5, Hg 7.4, PLT 22K. Afebrile. He is on Cipro. He is symptomatic of anemia. He denies significant bleeding.    The concern is he has not reached nadir.  We are arranging for 1 unit of blood and platelets on 12/3 at the [facility] site and again on 12/6 with office visit.  We will follow him closely.  The plan is to proceed with port placement, PET scan, and IT methotrexate next week.  We reviewed pancytopenia precautions.  Follow-up next week  The plan was reviewed with Dr. Burr Medico.   Orders Placed This Encounter  Procedures  . Informed Consent Details: Physician/Practitioner Attestation; Transcribe to consent form and obtain patient signature    Standing Status:   Standing    Number of Occurrences:   1    Order Specific Question:   Physician/Practitioner attestation of informed consent for blood and or blood product transfusion    Answer:   I, the physician/practitioner, attest that I have discussed with the patient the benefits, risks, side effects, alternatives, likelihood of achieving goals  and potential problems  during recovery for the procedure that I have provided informed consent.    Order Specific Question:   Product(s)    Answer:   All Product(s)   All questions were answered. The patient knows to call the clinic with any problems, questions or concerns. No barriers to learning were detected.  Total encounter time was 30 minutes.     Alla Feeling, NP 05/10/20

## 2020-05-11 ENCOUNTER — Inpatient Hospital Stay: Payer: Medicare HMO

## 2020-05-11 DIAGNOSIS — C83398 Diffuse large b-cell lymphoma of other extranodal and solid organ sites: Secondary | ICD-10-CM

## 2020-05-11 DIAGNOSIS — D649 Anemia, unspecified: Secondary | ICD-10-CM

## 2020-05-11 DIAGNOSIS — D696 Thrombocytopenia, unspecified: Secondary | ICD-10-CM

## 2020-05-11 DIAGNOSIS — C8339 Diffuse large B-cell lymphoma, extranodal and solid organ sites: Secondary | ICD-10-CM

## 2020-05-11 MED ORDER — SODIUM CHLORIDE 0.9% IV SOLUTION
250.0000 mL | Freq: Once | INTRAVENOUS | Status: DC
  Filled 2020-05-11: qty 250

## 2020-05-11 NOTE — Progress Notes (Signed)
Pt discharged in no apparent distress. Pt left ambulatory without assistance. Pt aware of discharge instructions and verbalized understanding and had no further questions.  

## 2020-05-11 NOTE — Patient Instructions (Signed)
Blood Transfusion, Adult A blood transfusion is a procedure in which you receive blood or a type of blood cell (blood component) through an IV. You may need a blood transfusion when your blood level is low. This may result from a bleeding disorder, illness, injury, or surgery. The blood may come from a donor. You may also be able to donate blood for yourself (autologous blood donation) before a planned surgery. The blood given in a transfusion is made up of different blood components. You may receive:  Red blood cells. These carry oxygen to the cells in the body.  Platelets. These help your blood to clot.  Plasma. This is the liquid part of your blood. It carries proteins and other substances throughout the body.  White blood cells. These help you fight infections. If you have hemophilia or another clotting disorder, you may also receive other types of blood products. Tell a health care provider about:  Any blood disorders you have.  Any previous reactions you have had during a blood transfusion.  Any allergies you have.  All medicines you are taking, including vitamins, herbs, eye drops, creams, and over-the-counter medicines.  Any surgeries you have had.  Any medical conditions you have, including any recent fever or cold symptoms.  Whether you are pregnant or may be pregnant. What are the risks? Generally, this is a safe procedure. However, problems may occur.  The most common problems include: ? A mild allergic reaction, such as red, swollen areas of skin (hives) and itching. ? Fever or chills. This may be the body's response to new blood cells received. This may occur during or up to 4 hours after the transfusion.  More serious problems may include: ? Transfusion-associated circulatory overload (TACO), or too much fluid in the lungs. This may cause breathing problems. ? A serious allergic reaction, such as difficulty breathing or swelling around the face and  lips. ? Transfusion-related acute lung injury (TRALI), which causes breathing difficulty and low oxygen in the blood. This can occur within hours of the transfusion or several days later. ? Iron overload. This can happen after receiving many blood transfusions over a period of time. ? Infection or virus being transmitted. This is rare because donated blood is carefully tested before it is given. ? Hemolytic transfusion reaction. This is rare. It happens when your body's defense system (immune system)tries to attack the new blood cells. Symptoms may include fever, chills, nausea, low blood pressure, and low back or chest pain. ? Transfusion-associated graft-versus-host disease (TAGVHD). This is rare. It happens when donated cells attack your body's healthy tissues. What happens before the procedure? Medicines Ask your health care provider about:  Changing or stopping your regular medicines. This is especially important if you are taking diabetes medicines or blood thinners.  Taking medicines such as aspirin and ibuprofen. These medicines can thin your blood. Do not take these medicines unless your health care provider tells you to take them.  Taking over-the-counter medicines, vitamins, herbs, and supplements. General instructions  Follow instructions from your health care provider about eating and drinking restrictions.  You will have a blood test to determine your blood type. This is necessary to know what kind of blood your body will accept and to match it to the donor blood.  If you are going to have a planned surgery, you may be able to do an autologous blood donation. This may be done in case you need to have a transfusion.  You will have your temperature,   blood pressure, and pulse monitored before the transfusion.  If you have had an allergic reaction to a transfusion in the past, you may be given medicine to help prevent a reaction. This medicine may be given to you by mouth (orally)  or through an IV.  Set aside time for the blood transfusion. This procedure generally takes 1-4 hours to complete. What happens during the procedure?   An IV will be inserted into one of your veins.  The bag of donated blood will be attached to your IV. The blood will then enter through your vein.  Your temperature, blood pressure, and pulse will be monitored regularly during the transfusion. This monitoring is done to detect early signs of a transfusion reaction.  Tell your nurse right away if you have any of these symptoms during the transfusion: ? Shortness of breath or trouble breathing. ? Chest or back pain. ? Fever or chills. ? Hives or itching.  If you have any signs or symptoms of a reaction, your transfusion will be stopped and you may be given medicine.  When the transfusion is complete, your IV will be removed.  Pressure may be applied to the IV site for a few minutes.  A bandage (dressing)will be applied. The procedure may vary among health care providers and hospitals. What happens after the procedure?  Your temperature, blood pressure, pulse, breathing rate, and blood oxygen level will be monitored until you leave the hospital or clinic.  Your blood may be tested to see how you are responding to the transfusion.  You may be warmed with fluids or blankets to maintain a normal body temperature.  If you receive your blood transfusion in an outpatient setting, you will be told whom to contact to report any reactions. Where to find more information For more information on blood transfusions, visit the [employer]: redcross.org Summary  A blood transfusion is a procedure in which you receive blood or a type of blood cell (blood component) through an IV.  The blood you receive may come from a donor or be donated by yourself (autologous blood donation) before a planned surgery.  The blood given in a transfusion is made up of different blood components. You may  receive red blood cells, platelets, plasma, or white blood cells depending on the condition treated.  Your temperature, blood pressure, and pulse will be monitored before, during, and after the transfusion.  After the transfusion, your blood may be tested to see how your body has responded. This information is not intended to replace advice given to you by your health care provider. Make sure you discuss any questions you have with your health care provider. Document Revised: 11/18/2018 Document Reviewed: 11/18/2018 Else[employer] Patient Education  2020 [employer]   Platelet Transfusion A platelet transfusion is a procedure in which you receive donated platelets through an IV. Platelets are tiny pieces of blood cells. When you get an injury, platelets clump together in the area to form a blood clot. This helps stop bleeding and is the beginning of the healing process. If you have too few platelets, your blood may have trouble clotting. This may cause you to bleed and bruise very easily. You may need a platelet transfusion if you have a condition that causes a low number of platelets (thrombocytopenia). A platelet transfusion may be used to stop or prevent excessive bleeding. Tell a health care provider about:  Any reactions you have had during previous transfusions.  Any allergies you have.    All medicines you are taking, including vitamins, herbs, eye drops, creams, and over-the-counter medicines.  Any blood disorders you have.  Any surgeries you have had.  Any medical conditions you have.  Whether you are pregnant or may be pregnant. What are the risks? Generally, this is a safe procedure. However, problems may occur, including:  Fever.  Infection.  Allergic reaction to the donor platelets.  Your body's disease-fighting system (immune system) attacking the donor platelets (hemolytic reaction). This is rare.  A rare reaction that causes lung damage (transfusion-related acute  lung injury). What happens before the procedure? Medicines  Ask your health care provider about: ? Changing or stopping your regular medicines. This is especially important if you are taking diabetes medicines or blood thinners. ? Taking medicines such as aspirin and ibuprofen. These medicines can thin your blood. Do not take these medicines unless your health care provider tells you to take them. ? Taking over-the-counter medicines, vitamins, herbs, and supplements. General instructions  You will have a blood test to determine your blood type. Your blood type determines what kind of platelets you will be given.  Follow instructions from your health care provider about eating or drinking restrictions.  If you have had an allergic reaction to a transfusion in the past, you may be given medicine to help prevent a reaction.  Your temperature, blood pressure, pulse, and breathing will be monitored. What happens during the procedure?   An IV will be inserted into one of your veins.  For your safety, two health care providers will verify your identity along with the donor platelets about to be infused.  A bag of donor platelets will be connected to your IV. The platelets will flow into your bloodstream. This usually takes 30-60 minutes.  Your temperature, blood pressure, pulse, and breathing will be monitored during the transfusion. This helps detect early signs of any reaction.  You will also be monitored for other symptoms that may indicate a reaction, including chills, hives, or itching.  If you have signs of a reaction at any time, your transfusion will be stopped, and you may be given medicine to help manage the reaction.  When your transfusion is complete, your IV will be removed.  Pressure may be applied to the IV site for a few minutes to stop any bleeding.  The IV site will be covered with a bandage (dressing). The procedure may vary among health care providers and  hospitals. What happens after the procedure?  Your blood pressure, temperature, pulse, and breathing will be monitored until you leave the hospital or clinic.  You may have some bruising and soreness at your IV site. Follow these instructions at home: Medicines  Take over-the-counter and prescription medicines only as told by your health care provider.  Talk with your health care provider before you take any medicines that contain aspirin or NSAIDs. These medicines increase your risk for dangerous bleeding. General instructions  Change or remove your dressing as told by your health care provider.  Return to your normal activities as told by your health care provider. Ask your health care provider what activities are safe for you.  Do not take baths, swim, or use a hot tub until your health care provider approves. Ask your health care provider if you may take showers.  Check your IV site every day for signs of infection. Check for: ? Redness, swelling, or pain. ? Fluid or blood. If fluid or blood drains from your IV site, use your hands   to press down firmly on a bandage covering the area for a minute or two. Doing this should stop the bleeding. ? Warmth. ? Pus or a bad smell.  Keep all follow-up visits as told by your health care provider. This is important. Contact a health care provider if you have:  A headache that does not go away with medicine.  Hives, rash, or itchy skin.  Nausea or vomiting.  Unusual tiredness or weakness.  Signs of infection at your IV site. Get help right away if:  You have a fever or chills.  You urinate less often than usual.  Your urine is darker colored than normal.  You have any of the following: ? Trouble breathing. ? Pain in your back, abdomen, or chest. ? Cool, clammy skin. ? A fast heartbeat. Summary  Platelets are tiny pieces of blood cells that clump together to form a blood clot when you have an injury. If you have too few  platelets, your blood may have trouble clotting.  A platelet transfusion is a procedure in which you receive donated platelets through an IV.  A platelet transfusion may be used to stop or prevent excessive bleeding.  After the procedure, check your IV site every day for signs of infection, including redness, swelling, pain, or warmth. This information is not intended to replace advice given to you by your health care provider. Make sure you discuss any questions you have with your health care provider. Document Revised: 07/01/2017 Document Reviewed: 07/01/2017 Elsevier Patient Education  2020 [employer]   

## 2020-05-12 ENCOUNTER — Emergency Department (HOSPITAL_COMMUNITY): Payer: Medicare HMO

## 2020-05-12 ENCOUNTER — Other Ambulatory Visit: Payer: Self-pay

## 2020-05-12 ENCOUNTER — Encounter (HOSPITAL_COMMUNITY): Payer: Self-pay

## 2020-05-12 ENCOUNTER — Inpatient Hospital Stay (HOSPITAL_COMMUNITY)
Admission: EM | Admit: 2020-05-12 | Discharge: 2020-05-15 | DRG: 871 | Disposition: A | Discharge status: Home / Self Care | Payer: Medicare HMO | Attending: Internal Medicine | Admitting: Internal Medicine

## 2020-05-12 DIAGNOSIS — N179 Acute kidney failure, unspecified: Secondary | ICD-10-CM | POA: Diagnosis present

## 2020-05-12 DIAGNOSIS — R633 Feeding difficulties, unspecified: Secondary | ICD-10-CM | POA: Diagnosis present

## 2020-05-12 DIAGNOSIS — K120 Recurrent oral aphthae: Secondary | ICD-10-CM | POA: Diagnosis present

## 2020-05-12 DIAGNOSIS — Z808 Family history of malignant neoplasm of other organs or systems: Secondary | ICD-10-CM | POA: Diagnosis not present

## 2020-05-12 DIAGNOSIS — E871 Hypo-osmolality and hyponatremia: Secondary | ICD-10-CM | POA: Diagnosis present

## 2020-05-12 DIAGNOSIS — Z79899 Other long term (current) drug therapy: Secondary | ICD-10-CM | POA: Diagnosis not present

## 2020-05-12 DIAGNOSIS — Y929 Unspecified place or not applicable: Secondary | ICD-10-CM

## 2020-05-12 DIAGNOSIS — C8330 Diffuse large B-cell lymphoma, unspecified site: Secondary | ICD-10-CM | POA: Diagnosis present

## 2020-05-12 DIAGNOSIS — Z20822 Contact with and (suspected) exposure to covid-19: Secondary | ICD-10-CM | POA: Diagnosis present

## 2020-05-12 DIAGNOSIS — Z833 Family history of diabetes mellitus: Secondary | ICD-10-CM

## 2020-05-12 DIAGNOSIS — R5081 Fever presenting with conditions classified elsewhere: Secondary | ICD-10-CM | POA: Diagnosis present

## 2020-05-12 DIAGNOSIS — I10 Essential (primary) hypertension: Secondary | ICD-10-CM | POA: Diagnosis present

## 2020-05-12 DIAGNOSIS — E669 Obesity, unspecified: Secondary | ICD-10-CM | POA: Diagnosis present

## 2020-05-12 DIAGNOSIS — E869 Volume depletion, unspecified: Secondary | ICD-10-CM | POA: Diagnosis present

## 2020-05-12 DIAGNOSIS — A419 Sepsis, unspecified organism: Secondary | ICD-10-CM | POA: Diagnosis present

## 2020-05-12 DIAGNOSIS — J189 Pneumonia, unspecified organism: Secondary | ICD-10-CM | POA: Diagnosis present

## 2020-05-12 DIAGNOSIS — Z7982 Long term (current) use of aspirin: Secondary | ICD-10-CM

## 2020-05-12 DIAGNOSIS — D709 Neutropenia, unspecified: Secondary | ICD-10-CM | POA: Diagnosis present

## 2020-05-12 DIAGNOSIS — E876 Hypokalemia: Secondary | ICD-10-CM | POA: Diagnosis present

## 2020-05-12 DIAGNOSIS — D61810 Antineoplastic chemotherapy induced pancytopenia: Secondary | ICD-10-CM | POA: Diagnosis present

## 2020-05-12 DIAGNOSIS — Z6830 Body mass index (BMI) 30.0-30.9, adult: Secondary | ICD-10-CM | POA: Diagnosis not present

## 2020-05-12 DIAGNOSIS — C8338 Diffuse large B-cell lymphoma, lymph nodes of multiple sites: Secondary | ICD-10-CM | POA: Diagnosis not present

## 2020-05-12 DIAGNOSIS — T451X5A Adverse effect of antineoplastic and immunosuppressive drugs, initial encounter: Secondary | ICD-10-CM | POA: Diagnosis present

## 2020-05-12 DIAGNOSIS — K1230 Oral mucositis (ulcerative), unspecified: Secondary | ICD-10-CM | POA: Diagnosis present

## 2020-05-12 DIAGNOSIS — Z5111 Encounter for antineoplastic chemotherapy: Secondary | ICD-10-CM

## 2020-05-12 LAB — TYPE AND SCREEN
ABO/RH(D): A POS
Antibody Screen: NEGATIVE
Unit division: 0

## 2020-05-12 LAB — BPAM PLATELET PHERESIS
Blood Product Expiration Date: 202112042359
ISSUE DATE / TIME: 202112030744
Unit Type and Rh: 6200

## 2020-05-12 LAB — URINALYSIS, ROUTINE W REFLEX MICROSCOPIC
Bacteria, UA: NONE SEEN
Bilirubin Urine: NEGATIVE
Glucose, UA: 50 mg/dL — AB
Hgb urine dipstick: NEGATIVE
Ketones, ur: NEGATIVE mg/dL
Nitrite: NEGATIVE
Protein, ur: NEGATIVE mg/dL
Specific Gravity, Urine: 1.016 (ref 1.005–1.030)
pH: 6 (ref 5.0–8.0)

## 2020-05-12 LAB — COMPREHENSIVE METABOLIC PANEL
ALT: 90 U/L — ABNORMAL HIGH (ref 0–44)
AST: 29 U/L (ref 15–41)
Albumin: 2.9 g/dL — ABNORMAL LOW (ref 3.5–5.0)
Alkaline Phosphatase: 173 U/L — ABNORMAL HIGH (ref 38–126)
Anion gap: 14 (ref 5–15)
BUN: 24 mg/dL — ABNORMAL HIGH (ref 8–23)
CO2: 18 mmol/L — ABNORMAL LOW (ref 22–32)
Calcium: 8.7 mg/dL — ABNORMAL LOW (ref 8.9–10.3)
Chloride: 96 mmol/L — ABNORMAL LOW (ref 98–111)
Creatinine, Ser: 1.38 mg/dL — ABNORMAL HIGH (ref 0.61–1.24)
GFR, Estimated: 54 mL/min — ABNORMAL LOW (ref >60)
Glucose, Bld: 179 mg/dL — ABNORMAL HIGH (ref 70–99)
Potassium: 3.4 mmol/L — ABNORMAL LOW (ref 3.5–5.1)
Sodium: 128 mmol/L — ABNORMAL LOW (ref 135–145)
Total Bilirubin: 1.3 mg/dL — ABNORMAL HIGH (ref 0.3–1.2)
Total Protein: 5.2 g/dL — ABNORMAL LOW (ref 6.5–8.1)

## 2020-05-12 LAB — CBC WITH DIFFERENTIAL/PLATELET
HCT: 23.3 % — ABNORMAL LOW (ref 39.0–52.0)
Hemoglobin: 8.1 g/dL — ABNORMAL LOW (ref 13.0–17.0)
MCH: 29.7 pg (ref 26.0–34.0)
MCHC: 34.8 g/dL (ref 30.0–36.0)
MCV: 85.3 fL (ref 80.0–100.0)
Platelets: 23 10*3/uL — CL (ref 150–400)
RBC: 2.73 MIL/uL — ABNORMAL LOW (ref 4.22–5.81)
RDW: 14.1 % (ref 11.5–15.5)
WBC: <0.1 10*3/uL — CL (ref 4.0–10.5)
nRBC: 0 % (ref 0.0–0.2)

## 2020-05-12 LAB — PROTIME-INR
INR: 1.6 — ABNORMAL HIGH (ref 0.8–1.2)
Prothrombin Time: 18.4 seconds — ABNORMAL HIGH (ref 11.4–15.2)

## 2020-05-12 LAB — BPAM RBC
Blood Product Expiration Date: 202112192359
ISSUE DATE / TIME: 202112030752
Unit Type and Rh: 6200

## 2020-05-12 LAB — PREPARE PLATELET PHERESIS: Unit division: 0

## 2020-05-12 LAB — LACTIC ACID, PLASMA
Lactic Acid, Venous: 3.8 mmol/L (ref 0.5–1.9)
Lactic Acid, Venous: 3 mmol/L (ref 0.5–1.9)

## 2020-05-12 LAB — URIC ACID: Uric Acid, Serum: 3.3 mg/dL — ABNORMAL LOW (ref 3.7–8.6)

## 2020-05-12 LAB — APTT: aPTT: 34 seconds (ref 24–36)

## 2020-05-12 MED ORDER — LACTATED RINGERS IV SOLN: INTRAVENOUS | Status: AC

## 2020-05-12 MED ORDER — TBO-FILGRASTIM 480 MCG/0.8ML ~~LOC~~ SOSY
480.0000 ug | PREFILLED_SYRINGE | Freq: Once | SUBCUTANEOUS | Status: AC
  Administered 2020-05-12: 480 ug via SUBCUTANEOUS
  Filled 2020-05-12: qty 0.8

## 2020-05-12 MED ORDER — SODIUM CHLORIDE 0.9 % IV SOLN
2.0000 g | Freq: Once | INTRAVENOUS | Status: AC
  Administered 2020-05-12: 2 g via INTRAVENOUS
  Filled 2020-05-12: qty 2

## 2020-05-12 MED ORDER — LACTATED RINGERS IV BOLUS (SEPSIS)
1000.0000 mL | Freq: Once | INTRAVENOUS | Status: AC
  Administered 2020-05-12: 1000 mL via INTRAVENOUS

## 2020-05-12 MED ORDER — LACTATED RINGERS IV SOLN: INTRAVENOUS | Status: DC

## 2020-05-12 MED ORDER — ACETAMINOPHEN 650 MG RE SUPP: 650.0000 mg | Freq: Four times a day (QID) | RECTAL | Status: DC | PRN

## 2020-05-12 MED ORDER — VANCOMYCIN HCL 2000 MG/400ML IV SOLN
2000.0000 mg | Freq: Once | INTRAVENOUS | Status: AC
  Administered 2020-05-12: 2000 mg via INTRAVENOUS
  Filled 2020-05-12: qty 400

## 2020-05-12 MED ORDER — METRONIDAZOLE 500 MG PO TABS
500.0000 mg | ORAL_TABLET | Freq: Three times a day (TID) | ORAL | Status: DC
  Administered 2020-05-13 – 2020-05-14 (×7): 500 mg via ORAL
  Filled 2020-05-12 (×8): qty 1

## 2020-05-12 MED ORDER — ONDANSETRON HCL 4 MG PO TABS: 4.0000 mg | ORAL_TABLET | Freq: Four times a day (QID) | ORAL | Status: DC | PRN

## 2020-05-12 MED ORDER — LACTATED RINGERS IV BOLUS (SEPSIS)
500.0000 mL | Freq: Once | INTRAVENOUS | Status: AC
  Administered 2020-05-12: 500 mL via INTRAVENOUS

## 2020-05-12 MED ORDER — ONDANSETRON HCL 4 MG/2ML IJ SOLN: 4.0000 mg | Freq: Four times a day (QID) | INTRAMUSCULAR | Status: DC | PRN

## 2020-05-12 MED ORDER — VANCOMYCIN HCL IN DEXTROSE 1-5 GM/200ML-% IV SOLN: 1000.0000 mg | Freq: Once | INTRAVENOUS | Status: DC

## 2020-05-12 MED ORDER — ACETAMINOPHEN 325 MG PO TABS: 650.0000 mg | ORAL_TABLET | Freq: Four times a day (QID) | ORAL | Status: DC | PRN

## 2020-05-12 NOTE — ED Notes (Signed)
Date and time results received: 05/12/20 9:48 PM  Test: WBC  Critical Value: < 0.1  Test: Platelet  Critical Value: 23  Name of Provider Notified: Alvino Chapel, EDP

## 2020-05-12 NOTE — Progress Notes (Signed)
Pt being followed for Sepsis protocol 

## 2020-05-12 NOTE — ED Notes (Signed)
Patient provided with urinal and I asked him to give a urine sample.

## 2020-05-12 NOTE — ED Notes (Signed)
Date and time results received: 05/12/20 21:38   Test: Lactic Acid Critical Value: 3.8  Name of Provider Notified: Dr. Alvino Chapel

## 2020-05-12 NOTE — ED Provider Notes (Signed)
[facility] DEPT Provider Note   CSN: [id number] Arrival date & time: 05/12/20  2007     History Chief Complaint  Patient presents with  . Fever  . Diarrhea  . Fatigue    John [name] is a 74 y.o. male.  HPI Patient brought in for generalized weakness and had a fever by EMS.  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  Currently on Cipro and Flagyl for what appears to be possible diverticulitis.  Yesterday had blood and platelet transfusion.  Had a little diarrhea after taking a laxative for the constipation been having.  No nausea or vomiting.  No cough.  Feels fatigued all over.    Past Medical History:  Diagnosis Date  . Cancer ([place])   . Goals of care, counseling/discussion 05/06/2020  . Hypertension   . Knee pain, right     Patient Active Problem List   Diagnosis Date Noted  . Goals of care, counseling/discussion 05/06/2020  . 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 Diffuse large B cell lymphoma ([place]) 04/26/2020  . Tumor lysis syndrome 04/24/2020  . AKI (acute kidney injury) ([place]) 04/21/2020  . 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 Liver mass 04/21/2020  . Hypercalcemia 04/20/2020  . Actinic keratoses 05/15/2019  . 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 Encounter for general adult medical examination without abnormal findings 01/17/2016  . Hypertension 01/08/2016  . Benign prostatic hyperplasia with urinary frequency 01/08/2016  . Impaired fasting glucose 01/08/2016  . Obesity 01/08/2016  . Obstructive sleep apnea 01/08/2016  . Osteoarthritis 01/08/2016  . Tinnitus 01/08/2016  . Knee pain 09/22/2013    Past Surgical History:  Procedure Laterality Date  . FOOT SURGERY    . TONSILLECTOMY         Family History  Problem Relation Age of Onset  . Diabetes Mellitus II Brother   . Skin cancer Mother   . Cancer Mother        skin cancer    Social History   Tobacco Use  . Smoking status: Never  Smoker  . Smokeless tobacco: Never Used  Vaping Use  . Vaping Use: Never used  Substance Use Topics  . Alcohol use: No  . Drug use: No    Home Medications Prior to Admission medications   Medication Sig Start Date End Date Taking? Authorizing Provider  allopurinol (ZYLOPRIM) 100 MG tablet Take 1 tablet (100 mg total) by mouth daily. 04/26/20 05/26/20  [place] Eric J, DO  Ascorbic Acid (VITAMIN C WITH ROSE HIPS) 1000 MG tablet Take 1,000 mg by mouth daily.    [provider]  aspirin EC 81 MG tablet Take 81 mg by mouth daily.    [provider]  ciprofloxacin (CIPRO) 500 MG tablet Take 1 tablet (500 mg total) by mouth 2 (two) times daily for 7 days. 05/07/20 05/14/20  Maryanna Shape, NP  clobetasol ointment (TEMOVATE) 7.37 % Apply 1 application topically 2 (two) times daily as needed (eczema).     [provider]  diphenhydrAMINE (BENADRYL) 25 mg capsule Take 1 capsule (25 mg total) by mouth at bedtime as needed for sleep. 05/07/20   Maryanna Shape, NP  docusate sodium (COLACE) 100 MG capsule Take 100 mg by mouth 2 (two) times daily as needed for mild constipation. 04/14/20   [provider]  doxazosin (CARDURA) 4 MG tablet Take 4 mg by mouth daily.    [provider]  feeding supplement (ENSURE ENLIVE /  ENSURE PLUS) LIQD Take 237 mLs by mouth 2 (two) times daily between meals. 05/07/20   Maryanna Shape, NP  HYDROcodone-acetaminophen (NORCO/VICODIN) 5-325 MG tablet Take 1 tablet by mouth every 6 (six) hours as needed for moderate pain. 05/07/20   Maryanna Shape, NP  lactulose (CHRONULAC) 10 GM/15ML solution Take 30 mLs (20 g total) by mouth 2 (two) times daily as needed for mild constipation or moderate constipation. 05/07/20   Maryanna Shape, NP  magnesium hydroxide (MILK OF MAGNESIA) 400 MG/5ML suspension Take 30 mLs by mouth daily as needed for mild constipation. 05/07/20   Maryanna Shape, NP  metoprolol tartrate (LOPRESSOR) 25  MG tablet Take 0.5 tablets (12.5 mg total) by mouth 2 (two) times daily. 06/09/18   Lajean Saver, MD  metroNIDAZOLE (FLAGYL) 500 MG tablet Take 1 tablet (500 mg total) by mouth every 8 (eight) hours for 7 days. 05/07/20 05/14/20  Maryanna Shape, NP  Multiple Vitamins-Minerals (MULTIVITAMIN PO) Take 1 tablet by mouth daily.    [provider]  Omega-3 Fatty Acids (OMEGA 3 PO) Take 1 tablet by mouth daily.    [provider]  ondansetron (ZOFRAN) 8 MG tablet Take 1 tablet (8 mg total) by mouth every 8 (eight) hours as needed for nausea or vomiting. 05/07/20   Maryanna Shape, NP  pantoprazole (PROTONIX) 40 MG tablet Take 1 tablet (40 mg total) by mouth daily. 04/26/20 04/26/21  [place] Donnamarie Poag, DO  polyethylene glycol powder (GLYCOLAX/MIRALAX) 17 GM/SCOOP powder Take 17 g by mouth daily as needed for mild constipation.  04/14/20   [provider]  prochlorperazine (COMPAZINE) 10 MG tablet Take 1 tablet (10 mg total) by mouth every 6 (six) hours as needed for nausea or vomiting. 05/07/20   Maryanna Shape, NP  senna-docusate (SENOKOT-S) 8.6-50 MG tablet Take 2 tablets by mouth 2 (two) times daily as needed for mild constipation. 05/07/20   Maryanna Shape, NP  simethicone (MYLICON) 80 MG chewable tablet Chew 1 tablet (80 mg total) by mouth 4 (four) times daily as needed for flatulence. 05/07/20   Maryanna Shape, NP  Sodium Chloride-Sodium Bicarb (SODIUM BICARBONATE/SODIUM CHLORIDE) SOLN 1 application by Mouth Rinse route as needed for dry mouth or mouth pain. 05/07/20   Maryanna Shape, NP  triamcinolone cream (KENALOG) 0.1 % Apply 1 application topically 3 (three) times daily as needed for rash. 04/02/20   [provider]    Allergies    Patient has no known allergies.  Review of Systems   Review of Systems  Constitutional: Positive for appetite change, fatigue and fever.  HENT: Negative for congestion.   Respiratory: Negative for shortness of breath.    Gastrointestinal: Positive for diarrhea. Negative for abdominal pain.  Genitourinary: Negative for flank pain.  Musculoskeletal: Negative for back pain.  Skin: Negative for rash.  Neurological: Positive for weakness.  Psychiatric/Behavioral: Negative for confusion.    Physical Exam Updated Vital Signs BP 133/72   Pulse (!) 109   Temp (!) 100.6 F (38.1 C) (Oral)   Resp (!) 26   Ht 5\' 11"  (1.803 m)   Wt 100.6 kg   SpO2 99%   BMI 30.92 kg/m   Physical Exam Vitals and nursing note reviewed.  HENT:     Head: Normocephalic and atraumatic.     Mouth/Throat:     Mouth: Mucous membranes are moist.     Comments: Posterior pharynx has some area of plaque. Eyes:     Extraocular  Movements: Extraocular movements intact.     Pupils: Pupils are equal, round, and reactive to light.  Cardiovascular:     Rate and Rhythm: Regular rhythm. Tachycardia present.  Pulmonary:     Breath sounds: No wheezing, rhonchi or rales.  Abdominal:     General: There is no distension.     Hernia: No hernia is present.  Musculoskeletal:        General: No tenderness.     Cervical back: Neck supple.     Right lower leg: Edema present.     Left lower leg: Edema present.  Skin:    General: Skin is warm.     Capillary Refill: Capillary refill takes less than 2 seconds.  Neurological:     Mental Status: He is alert and oriented to person, place, and time.  Psychiatric:        Mood and Affect: Mood normal.     ED Results / Procedures / Treatments   Labs (all labs ordered are listed, but only abnormal results are displayed) Labs Reviewed  LACTIC ACID, PLASMA - Abnormal; Notable for the following components:      Result Value   Lactic Acid, Venous 3.8 (*)    All other components within normal limits  COMPREHENSIVE METABOLIC PANEL - Abnormal; Notable for the following components:   Sodium 128 (*)    Potassium 3.4 (*)    Chloride 96 (*)    CO2 18 (*)    Glucose, Bld 179 (*)    BUN 24 (*)     Creatinine, Ser 1.38 (*)    Calcium 8.7 (*)    Total Protein 5.2 (*)    Albumin 2.9 (*)    ALT 90 (*)    Alkaline Phosphatase 173 (*)    Total Bilirubin 1.3 (*)    GFR, Estimated 54 (*)    All other components within normal limits  CBC WITH DIFFERENTIAL/PLATELET - Abnormal; Notable for the following components:   WBC <0.1 (*)    RBC 2.73 (*)    Hemoglobin 8.1 (*)    HCT 23.3 (*)    Platelets 23 (*)    All other components within normal limits  PROTIME-INR - Abnormal; Notable for the following components:   Prothrombin Time 18.4 (*)    INR 1.6 (*)    All other components within normal limits  URIC ACID - Abnormal; Notable for the following components:   Uric Acid, Serum 3.3 (*)    All other components within normal limits  CULTURE, BLOOD (ROUTINE X 2)  CULTURE, BLOOD (ROUTINE X 2)  URINE CULTURE  APTT  LACTIC ACID, PLASMA  URINALYSIS, ROUTINE W REFLEX MICROSCOPIC    EKG EKG Interpretation  Date/Time:  Saturday May 12 2020 20:49:40 EST Ventricular Rate:  119 PR Interval:    QRS Duration: 85 QT Interval:  330 QTC Calculation: 465 R Axis:   34 Text Interpretation: Sinus tachycardia Abnormal R-wave progression, early transition Confirmed by Davonna Belling [phone]) on 05/12/2020 9:43:32 PM   Radiology DG Chest Port 1 View  Result Date: 05/12/2020 CLINICAL DATA:  Sepsis EXAM: PORTABLE CHEST 1 VIEW COMPARISON:  April 20, 2020 FINDINGS: There is a subtle right infrahilar airspace opacity. No pneumothorax. No large pleural effusion. The heart size is stable. Aortic calcifications are noted. The previously demonstrated adenopathy in the upper mediastinum is improved. IMPRESSION: 1. Subtle right infrahilar airspace opacity may represent atelectasis or infiltrate. 2. Improved mediastinal adenopathy. 3. No pneumothorax or large pleural effusion. Electronically Signed  By: Constance Holster M.D.   On: 05/12/2020 21:16    Procedures Procedures (including critical care  time)  Medications Ordered in ED Medications  lactated ringers infusion ( Intravenous New Bag/Given 05/12/20 2132)  lactated ringers bolus 1,000 mL (1,000 mLs Intravenous New Bag/Given 05/12/20 2118)    And  lactated ringers bolus 1,000 mL (1,000 mLs Intravenous New Bag/Given 05/12/20 2116)    And  lactated ringers bolus 1,000 mL (1,000 mLs Intravenous New Bag/Given 05/12/20 2129)    And  lactated ringers bolus 500 mL (has no administration in time range)  vancomycin (VANCOREADY) IVPB 2000 mg/400 mL (2,000 mg Intravenous New Bag/Given 05/12/20 2136)  Tbo-Filgrastim (GRANIX) injection 480 mcg (has no administration in time range)  ceFEPIme (MAXIPIME) 2 g in sodium chloride 0.9 % 100 mL IVPB (0 g Intravenous Stopped 05/12/20 2150)    ED Course  I have reviewed the triage vital signs and the nursing notes.  Pertinent labs & imaging results that were available during my care of the patient were reviewed by me and considered in my medical decision making (see chart for details).    MDM Rules/Calculators/A&P                          Patient presents with fever.  Neutropenic.  Had chemotherapy on the 23rd of last month.  Had Neulasta on the 30th.  Had platelets and PRBC transfusion on the 29th.  Has had a little bit of diarrhea.  No real abdominal pain.  Now severe neutropenia.  Unable to do differential due to lack of white cells.  Also anemia and low platelets.  No cough.  X-ray showed possible pneumonia.  Had currently been on Cipro and Flagyl for possible diverticulitis.  Tachycardia improved somewhat.  With fever code sepsis have been called and given 77mL/kg of lactated Ringer's.  Also vancomycin and cefepime.  Discussed with Dr. Earlie Server.  Recommended Granix.  Admit to internal medicine.  CRITICAL CARE Performed by: Davonna Belling Total critical care time: 30 minutes Critical care time was exclusive of separately billable procedures and treating other patients. Critical care was necessary  to treat or prevent imminent or life-threatening deterioration. Critical care was time spent personally by me on the following activities: development of treatment plan with patient and/or surrogate as well as nursing, discussions with consultants, evaluation of patient's response to treatment, examination of patient, obtaining history from patient or surrogate, ordering and performing treatments and interventions, ordering and review of laboratory studies, ordering and review of radiographic studies, pulse oximetry and re-evaluation of patient's condition.  Final Clinical Impression(s) / ED Diagnoses Final diagnoses:  Neutropenic fever [facility])    Rx / DC Orders ED Discharge Orders    None       Davonna Belling, MD 05/12/20 2308

## 2020-05-12 NOTE — ED Triage Notes (Signed)
Patient BIB [facility] after he took a laxative prescribed by doctor for constipation. After 3 episodes of diarrhea he then spiked a fever and has generalized weakness. Friday this week his blood work came back, his platelets and RBC low. Had a unit of blood and unit of platelets. Currently on 2 antibiotics due to chemo. He has stage 4 lymphoma cancer. Patient reports no blood in diarrhea, no nausea or vomiting.   18G left AC 1000mg  Tylenol PO and has 500 NS going in now from EMS  EMS vitals Temp 102.22F CBG 198 HR 132 RR 28 EtCo2 20 O2 98% Room air 136/68 BP

## 2020-05-12 NOTE — Progress Notes (Signed)
A consult was received from an ED physician for Vancomycin and Cefepime per pharmacy dosing.  The patient's profile has been reviewed for ht/wt/allergies/indication/available labs.    A one time order has been placed for Vancomycin 2g IV and Cefepime 2g IV.  Further antibiotics/pharmacy consults should be ordered by admitting physician if indicated.                       Thank you, Luiz Ochoa 05/12/2020  8:51 PM

## 2020-05-12 NOTE — ED Notes (Signed)
Date and time results received: 05/12/20 2348   Test: Lactic Acid Critical Value: 3.0  Name of Provider Notified: Alcario Drought, MD

## 2020-05-12 NOTE — H&P (Signed)
History and Physical    John [name] DDU:[id number] DOB: 1945/08/14 DOA: 05/12/2020  PCP: Christain Sacramento, MD  Patient coming from: Home  I have personally briefly reviewed patient's old medical records in [place]  Chief Complaint: Fever, diarrhea  HPI: John [name] is a 74 y.o. male with medical history significant of DLBCL diagnosed during hospitalization for AKI and hypercalcemia early November, underwent induction chemotherapy in a second hospital stay just this past week.  Pt currently on cipro/flagyl for possible diverticulitis seen on CT scan 11/28.  Pt discharged from hospital on 11/29.  Pt has pancytopenia from the chemotherapy, just had PRBC and platelet transfusion yesterday.  Pt presents to ED with generalized weakness, fever, and ongoing diarrhea.  Diarrhea onset after taking laxative.  Symptoms are severe, persistent, worsening since onset earlier today.  No cough, no N/V.   ED Course: WBC <0.1!  HGB 8.1, platelets 23 (was 7.4 and 22 on 12/2)  Uric acid 3.3.  CXR with atelectasis vs infiltrate.  Lactate 3.8.  Pt given cefepime, vanc, 30cc/kg IVF bolus.   Review of Systems: As per HPI, otherwise all review of systems negative.  Past Medical History:  Diagnosis Date  . Cancer ([employer])   . Goals of care, counseling/discussion 05/06/2020  . Hypertension   . Knee pain, right     Past Surgical History:  Procedure Laterality Date  . FOOT SURGERY    . TONSILLECTOMY       reports that he has never smoked. He has never used smokeless tobacco. He reports that he does not drink alcohol and does not use drugs.  No Known Allergies  Family History  Problem Relation Age of Onset  . Diabetes Mellitus II Brother   . Skin cancer Mother   . Cancer Mother        skin cancer    Prior to Admission medications   Medication Sig Start Date End Date Taking? Authorizing Provider  allopurinol (ZYLOPRIM) 100 MG tablet Take 1 tablet (100 mg total) by mouth  daily. 04/26/20 05/26/20  [place] Eric J, DO  Ascorbic Acid (VITAMIN C WITH ROSE HIPS) 1000 MG tablet Take 1,000 mg by mouth daily.    [provider]  aspirin EC 81 MG tablet Take 81 mg by mouth daily.    [provider]  ciprofloxacin (CIPRO) 500 MG tablet Take 1 tablet (500 mg total) by mouth 2 (two) times daily for 7 days. 05/07/20 05/14/20  Maryanna Shape, NP  clobetasol ointment (TEMOVATE) 2.37 % Apply 1 application topically 2 (two) times daily as needed (eczema).     [provider]  diphenhydrAMINE (BENADRYL) 25 mg capsule Take 1 capsule (25 mg total) by mouth at bedtime as needed for sleep. 05/07/20   Maryanna Shape, NP  docusate sodium (COLACE) 100 MG capsule Take 100 mg by mouth 2 (two) times daily as needed for mild constipation. 04/14/20   [provider]  doxazosin (CARDURA) 4 MG tablet Take 4 mg by mouth daily.    [provider]  feeding supplement (ENSURE ENLIVE / ENSURE PLUS) LIQD Take 237 mLs by mouth 2 (two) times daily between meals. 05/07/20   Maryanna Shape, NP  HYDROcodone-acetaminophen (NORCO/VICODIN) 5-325 MG tablet Take 1 tablet by mouth every 6 (six) hours as needed for moderate pain. 05/07/20   Maryanna Shape, NP  lactulose (CHRONULAC) 10 GM/15ML solution Take 30 mLs (20 g total) by mouth 2 (two) times daily as needed for mild  constipation or moderate constipation. 05/07/20   Maryanna Shape, NP  magnesium hydroxide (MILK OF MAGNESIA) 400 MG/5ML suspension Take 30 mLs by mouth daily as needed for mild constipation. 05/07/20   Maryanna Shape, NP  metoprolol tartrate (LOPRESSOR) 25 MG tablet Take 0.5 tablets (12.5 mg total) by mouth 2 (two) times daily. 06/09/18   Lajean Saver, MD  metroNIDAZOLE (FLAGYL) 500 MG tablet Take 1 tablet (500 mg total) by mouth every 8 (eight) hours for 7 days. 05/07/20 05/14/20  Maryanna Shape, NP  Multiple Vitamins-Minerals (MULTIVITAMIN PO) Take 1 tablet by mouth daily.    [provider]  Omega-3 Fatty Acids (OMEGA 3 PO) Take 1 tablet by mouth daily.    [provider]  ondansetron (ZOFRAN) 8 MG tablet Take 1 tablet (8 mg total) by mouth every 8 (eight) hours as needed for nausea or vomiting. 05/07/20   Maryanna Shape, NP  pantoprazole (PROTONIX) 40 MG tablet Take 1 tablet (40 mg total) by mouth daily. 04/26/20 04/26/21  [place] Donnamarie Poag, DO  polyethylene glycol powder (GLYCOLAX/MIRALAX) 17 GM/SCOOP powder Take 17 g by mouth daily as needed for mild constipation.  04/14/20   [provider]  prochlorperazine (COMPAZINE) 10 MG tablet Take 1 tablet (10 mg total) by mouth every 6 (six) hours as needed for nausea or vomiting. 05/07/20   Maryanna Shape, NP  senna-docusate (SENOKOT-S) 8.6-50 MG tablet Take 2 tablets by mouth 2 (two) times daily as needed for mild constipation. 05/07/20   Maryanna Shape, NP  simethicone (MYLICON) 80 MG chewable tablet Chew 1 tablet (80 mg total) by mouth 4 (four) times daily as needed for flatulence. 05/07/20   Maryanna Shape, NP  Sodium Chloride-Sodium Bicarb (SODIUM BICARBONATE/SODIUM CHLORIDE) SOLN 1 application by Mouth Rinse route as needed for dry mouth or mouth pain. 05/07/20   Maryanna Shape, NP  triamcinolone cream (KENALOG) 0.1 % Apply 1 application topically 3 (three) times daily as needed for rash. 04/02/20   [provider]    Physical Exam: Vitals:   05/12/20 2130 05/12/20 2200 05/12/20 2230 05/12/20 2300  BP: 125/66 130/70 126/68 133/72  Pulse: (!) 108 (!) 109 (!) 105 (!) 109  Resp: (!) 25 (!) 21 14 (!) 26  Temp:      TempSrc:      SpO2: 99% 97% 95% 99%  Weight:      Height:        Constitutional: Ill appearing, Pale Eyes: PERRL, lids and conjunctivae normal ENMT: Mucous membranes are moist. Posterior pharynx clear of any exudate or lesions.Normal dentition.  Neck: normal, supple, no masses, no thyromegaly Respiratory: Tachypnic Cardiovascular: Regular rate and rhythm, no  murmurs / rubs / gallops. No extremity edema. 2+ pedal pulses. No carotid bruits.  Abdomen: no tenderness, no masses palpated. No hepatosplenomegaly. Bowel sounds positive.  Musculoskeletal: no clubbing / cyanosis. No joint deformity upper and lower extremities. Good ROM, no contractures. Normal muscle tone.  Skin: no rashes, lesions, ulcers. No induration Neurologic: CN 2-12 grossly intact. Sensation intact, DTR normal. Strength 5/5 in all 4.  Psychiatric: Normal judgment and insight. Alert and oriented x 3. Normal mood.    Labs on Admission: I have personally reviewed following labs and imaging studies  CBC: Recent Labs  Lab 05/06/20 0542 05/07/20 0446 05/10/20 0956 05/12/20 2044  WBC 5.5 4.4 0.6* <0.1*  NEUTROABS 5.4 4.2 0.5*  --   HGB 8.3* 6.9* 7.4* 8.1*  HCT 23.7* 19.9* 22.1* 23.3*  MCV 85.6 86.5 86.7 85.3  PLT 85* 53* 22* 23*   Basic Metabolic Panel: Recent Labs  Lab 05/06/20 0542 05/07/20 0446 05/10/20 0956 05/12/20 2044  NA 134* 132* 134* 128*  K 3.2* 3.5 3.9 3.4*  CL 100 100 102 96*  CO2 24 25 24  18*  GLUCOSE 129* 100* 144* 179*  BUN 44* 36* 32* 24*  CREATININE 1.44* 1.37* 1.30* 1.38*  CALCIUM 9.3 8.8* 8.7* 8.7*   GFR: Estimated Creatinine Clearance: 58.4 mL/min (A) (by C-G formula based on SCr of 1.38 mg/dL (H)). Liver Function Tests: Recent Labs  Lab 05/06/20 0542 05/07/20 0446 05/10/20 0956 05/12/20 2044  AST 285* 327*  324* 27 29  ALT 351* 689*  703* 208* 90*  ALKPHOS 116 170*  172* 178* 173*  BILITOT 1.6* 1.7*  2.1* 1.3* 1.3*  PROT 5.3* 4.6*  4.7* 4.9* 5.2*  ALBUMIN 3.0* 2.7*  2.7* 2.7* 2.9*   No results for input(s): LIPASE, AMYLASE in the last 168 hours. No results for input(s): AMMONIA in the last 168 hours. Coagulation Profile: Recent Labs  Lab 05/12/20 2044  INR 1.6*   Cardiac Enzymes: No results for input(s): CKTOTAL, CKMB, CKMBINDEX, TROPONINI in the last 168 hours. BNP (last 3 results) No results for input(s): PROBNP in  the last 8760 hours. HbA1C: No results for input(s): HGBA1C in the last 72 hours. CBG: No results for input(s): GLUCAP in the last 168 hours. Lipid Profile: No results for input(s): CHOL, HDL, LDLCALC, TRIG, CHOLHDL, LDLDIRECT in the last 72 hours. Thyroid Function Tests: No results for input(s): TSH, T4TOTAL, FREET4, T3FREE, THYROIDAB in the last 72 hours. Anemia Panel: No results for input(s): VITAMINB12, FOLATE, FERRITIN, TIBC, IRON, RETICCTPCT in the last 72 hours. Urine analysis:    Component Value Date/Time   COLORURINE YELLOW 04/20/2020 1916   APPEARANCEUR CLEAR 04/20/2020 1916   LABSPEC 1.010 04/20/2020 1916   PHURINE 6.0 04/20/2020 1916   GLUCOSEU NEGATIVE 04/20/2020 1916   HGBUR NEGATIVE 04/20/2020 1916   BILIRUBINUR NEGATIVE 04/20/2020 1916   KETONESUR NEGATIVE 04/20/2020 1916   PROTEINUR NEGATIVE 04/20/2020 1916   UROBILINOGEN 0.2 01/24/2012 0709   NITRITE NEGATIVE 04/20/2020 1916   LEUKOCYTESUR NEGATIVE 04/20/2020 1916    Radiological Exams on Admission: DG Chest Port 1 View  Result Date: 05/12/2020 CLINICAL DATA:  Sepsis EXAM: PORTABLE CHEST 1 VIEW COMPARISON:  April 20, 2020 FINDINGS: There is a subtle right infrahilar airspace opacity. No pneumothorax. No large pleural effusion. The heart size is stable. Aortic calcifications are noted. The previously demonstrated adenopathy in the upper mediastinum is improved. IMPRESSION: 1. Subtle right infrahilar airspace opacity may represent atelectasis or infiltrate. 2. Improved mediastinal adenopathy. 3. No pneumothorax or large pleural effusion. Electronically Signed   By: Constance Holster M.D.   On: 05/12/2020 21:16    EKG: Independently reviewed.  Assessment/Plan Principal Problem:   Febrile neutropenia (HCC) Active Problems:   Diffuse large B cell lymphoma (HCC)   Sepsis (HCC)   Antineoplastic chemotherapy induced pancytopenia ([place])    1. Sepsis and febrile neutropenia - 1. ? PNA on CXR 2. Sepsis  pathway 3. Empiric cefepime, flagyl, vanc 4. BCx pending 5. Tele monitor 6. Serial lactates 7. IVF: 3.5L bolus then LR at 150 8. Repeat CBC/CMP in AM 9. EDP spoke with oncology: 1. Granix ordered 2. They will see in AM 2. Pancytopenia - 1. Got 1u PRBC and 1u platelets transfused yesterday 2. Repeat CBC in AM 3. DLBCL - 1. S/P induction chemotherapy 2. Uric acid  has trended down over past week and only 3.3 today.  Doubt he has TLS today. 3. Will check phosphate 4. Renal insufficiency - 1. Creat of 1.3 today is actually the best its been since the admission, diagnosis, and induction chemotherapy earlier this month.  DVT prophylaxis: SCDs - thrombocytopenia Code Status: Full Family Communication: Wife at bedside Disposition Plan: Home after sepsis and febrile neutropenia resolved Consults called: EDP spoke with oncology Admission status: Admit to inpatient  Severity of Illness: The appropriate patient status for this patient is INPATIENT. Inpatient status is judged to be reasonable and necessary in order to provide the required intensity of service to ensure the patient's safety. The patient's presenting symptoms, physical exam findings, and initial radiographic and laboratory data in the context of their chronic comorbidities is felt to place them at high risk for further clinical deterioration. Furthermore, it is not anticipated that the patient will be medically stable for discharge from the hospital within 2 midnights of admission. The following factors support the patient status of inpatient.   IP status due to sepsis and febrile neutropenia.  * I certify that at the point of admission it is my clinical judgment that the patient will require inpatient hospital care spanning beyond 2 midnights from the point of admission due to high intensity of service, high risk for further deterioration and high frequency of surveillance required.*    Zharia Conrow M. DO [facility]  How to contact the [facility] Attending or Consulting provider [place] or covering provider during after hours [place], for this patient?  1. Check the care team in [facility] and look for a) attending/consulting [facility] provider listed and b) the [facility] team listed 2. Log into [email or web address]  Amion Physician Scheduling and messaging for groups and whole hospitals  On call and physician scheduling software for group practices, residents, hospitalists and other medical providers for call, clinic, rotation and shift schedules. On[employer] is a hospital-wide system for scheduling doctors and paging doctors on call. EasyPlot is for scientific plotting and data analysis.  [email or web address]  and use [place] universal password to access. If you do not have the password, please contact the hospital operator.  3. Locate the [facility] provider you are looking for under [facility] and page to a number that you can be directly reached. 4. If you still have difficulty reaching the provider, please page the [facility] (Director on Call) for the Hospitalists listed on amion for assistance.  05/12/2020, 11:27 PM

## 2020-05-13 ENCOUNTER — Encounter: Payer: Self-pay | Admitting: Hematology

## 2020-05-13 DIAGNOSIS — T451X5A Adverse effect of antineoplastic and immunosuppressive drugs, initial encounter: Secondary | ICD-10-CM

## 2020-05-13 DIAGNOSIS — C8338 Diffuse large B-cell lymphoma, lymph nodes of multiple sites: Secondary | ICD-10-CM

## 2020-05-13 DIAGNOSIS — A419 Sepsis, unspecified organism: Principal | ICD-10-CM

## 2020-05-13 DIAGNOSIS — D61810 Antineoplastic chemotherapy induced pancytopenia: Secondary | ICD-10-CM

## 2020-05-13 LAB — CBC
HCT: 20.3 % — ABNORMAL LOW (ref 39.0–52.0)
Hemoglobin: 7.2 g/dL — ABNORMAL LOW (ref 13.0–17.0)
MCH: 30.1 pg (ref 26.0–34.0)
MCHC: 35.5 g/dL (ref 30.0–36.0)
MCV: 84.9 fL (ref 80.0–100.0)
Platelets: 22 10*3/uL — CL (ref 150–400)
RBC: 2.39 MIL/uL — ABNORMAL LOW (ref 4.22–5.81)
RDW: 13.8 % (ref 11.5–15.5)
WBC: 0.1 10*3/uL — CL (ref 4.0–10.5)
nRBC: 0 % (ref 0.0–0.2)

## 2020-05-13 LAB — PROTIME-INR
INR: 1.7 — ABNORMAL HIGH (ref 0.8–1.2)
Prothrombin Time: 19.5 seconds — ABNORMAL HIGH (ref 11.4–15.2)

## 2020-05-13 LAB — COMPREHENSIVE METABOLIC PANEL
ALT: 74 U/L — ABNORMAL HIGH (ref 0–44)
AST: 20 U/L (ref 15–41)
Albumin: 2.5 g/dL — ABNORMAL LOW (ref 3.5–5.0)
Alkaline Phosphatase: 142 U/L — ABNORMAL HIGH (ref 38–126)
Anion gap: 9 (ref 5–15)
BUN: 22 mg/dL (ref 8–23)
CO2: 22 mmol/L (ref 22–32)
Calcium: 8.2 mg/dL — ABNORMAL LOW (ref 8.9–10.3)
Chloride: 99 mmol/L (ref 98–111)
Creatinine, Ser: 1.1 mg/dL (ref 0.61–1.24)
GFR, Estimated: >60 mL/min (ref >60)
Glucose, Bld: 120 mg/dL — ABNORMAL HIGH (ref 70–99)
Potassium: 3.3 mmol/L — ABNORMAL LOW (ref 3.5–5.1)
Sodium: 130 mmol/L — ABNORMAL LOW (ref 135–145)
Total Bilirubin: 0.9 mg/dL (ref 0.3–1.2)
Total Protein: 4.6 g/dL — ABNORMAL LOW (ref 6.5–8.1)

## 2020-05-13 LAB — PROCALCITONIN: Procalcitonin: 2.4 ng/mL

## 2020-05-13 LAB — PHOSPHORUS: Phosphorus: 2 mg/dL — ABNORMAL LOW (ref 2.5–4.6)

## 2020-05-13 LAB — RESP PANEL BY RT-PCR (FLU A&B, COVID) ARPGX2
Influenza A by PCR: NEGATIVE
Influenza B by PCR: NEGATIVE
SARS Coronavirus 2 by RT PCR: NEGATIVE

## 2020-05-13 LAB — MRSA PCR SCREENING: MRSA by PCR: NEGATIVE

## 2020-05-13 LAB — CORTISOL-AM, BLOOD: Cortisol - AM: 20 ug/dL (ref 6.7–22.6)

## 2020-05-13 MED ORDER — LACTATED RINGERS IV SOLN: INTRAVENOUS | Status: AC

## 2020-05-13 MED ORDER — MAGIC MOUTHWASH W/LIDOCAINE
15.0000 mL | Freq: Four times a day (QID) | ORAL | Status: DC | PRN
  Filled 2020-05-13: qty 15

## 2020-05-13 MED ORDER — SODIUM CHLORIDE 0.9 % IV SOLN
2.0000 g | Freq: Three times a day (TID) | INTRAVENOUS | Status: DC
  Administered 2020-05-13 – 2020-05-15 (×6): 2 g via INTRAVENOUS
  Filled 2020-05-13 (×7): qty 2

## 2020-05-13 MED ORDER — VANCOMYCIN HCL 1250 MG/250ML IV SOLN
1250.0000 mg | INTRAVENOUS | Status: DC
  Administered 2020-05-13 – 2020-05-14 (×2): 1250 mg via INTRAVENOUS
  Filled 2020-05-13 (×2): qty 250

## 2020-05-13 MED ORDER — MENTHOL 3 MG MT LOZG
1.0000 | LOZENGE | OROMUCOSAL | Status: DC | PRN
  Filled 2020-05-13: qty 9

## 2020-05-13 MED ORDER — SODIUM CHLORIDE 0.9 % IV SOLN
2.0000 g | Freq: Two times a day (BID) | INTRAVENOUS | Status: DC
  Administered 2020-05-13: 2 g via INTRAVENOUS
  Filled 2020-05-13: qty 2

## 2020-05-13 MED ORDER — POTASSIUM PHOSPHATES 15 MMOLE/5ML IV SOLN
30.0000 mmol | Freq: Once | INTRAVENOUS | Status: AC
  Administered 2020-05-13: 30 mmol via INTRAVENOUS
  Filled 2020-05-13: qty 10

## 2020-05-13 NOTE — Progress Notes (Signed)
Pharmacy Antibiotic Note  John [name] is a 74 y.o. male admitted on 05/12/2020 with febrile neutropenia.  Pharmacy has been consulted for Cefepime & Vancomycin dosing. Febrile on admission- Tm 100.6 Pancytopenic post chemotherapy AKI noted- Scr has been stable ~1.34-1.44  05/13/2020 AF since Tm of 100.6 SCr down to 1.1, CrCl ~ 73 ml/min WBC 0.1, PCT 2.4. lactate 3  Plan: Increase cefepime 2gm IV q8h Continue Vancomycin 1250mg  IV q24h to target trough 15-20 mcg/ml Continue flagyl 500 mg po q8h Monitor renal function and cx data  Vancomycin level at steady state if indicated Check MRSA PCR  Height: 5\' 11"  (180.3 cm) Weight: 100.6 kg (221 lb 11.2 oz) IBW/kg (Calculated) : 75.3  Temp (24hrs), Avg:99.4 F (37.4 C), Min:98.4 F (36.9 C), Max:100.6 F (38.1 C)  Recent Labs  Lab 05/07/20 0446 05/10/20 0956 05/12/20 2044 05/12/20 2244 05/13/20 0411  WBC 4.4 0.6* <0.1*  --  0.1*  CREATININE 1.37* 1.30* 1.38*  --  1.10  LATICACIDVEN  --   --  3.8* 3.0*  --     Estimated Creatinine Clearance: 73.3 mL/min (by C-G formula based on SCr of 1.1 mg/dL).    No Known Allergies  Antimicrobials this admission: 12/4 Cefepime >>  12/4 Vancomycin >>  12/5 Flagyl >> Dose adjustments this admission: 12/5 cefepime q12> q8h Microbiology results: 12/4 BCx:  12/4 UCx:   12/4 Resp PCR: negative for COVID/influenza  Thank you for allowing pharmacy to be a part of this patient's care.  Eudelia Bunch, Pharm.D 05/13/2020 10:25 AM

## 2020-05-13 NOTE — Progress Notes (Signed)
Pharmacy Antibiotic Note  John [name] is a 74 y.o. male admitted on 05/12/2020 with febrile neutropenia.  Pharmacy has been consulted for Cefepime & Vancomycin dosing.  Febrile on admission- Tm 100.6 Pancytopenic post chemotherapy AKI noted- Scr has been stable ~1.34-1.44  Plan: Cefepime 2gm IV q12h Vancomycin 1250mg  IV q24h to target trough 15-20 mcg/ml Monitor renal function and cx data  Vancomycin level at steady state if indicated  Height: 5\' 11"  (180.3 cm) Weight: 100.6 kg (221 lb 11.2 oz) IBW/kg (Calculated) : 75.3  Temp (24hrs), Avg:99.9 F (37.7 C), Min:99.1 F (37.3 C), Max:100.6 F (38.1 C)  Recent Labs  Lab 05/06/20 0542 05/07/20 0446 05/10/20 0956 05/12/20 2044 05/12/20 2244  WBC 5.5 4.4 0.6* <0.1*  --   CREATININE 1.44* 1.37* 1.30* 1.38*  --   LATICACIDVEN  --   --   --  3.8* 3.0*    Estimated Creatinine Clearance: 58.4 mL/min (A) (by C-G formula based on SCr of 1.38 mg/dL (H)).    No Known Allergies  Antimicrobials this admission: 12/4 Cefepime >>  12/4 Vancomycin >>  12/5 Flagyl >>  Dose adjustments this admission:  Microbiology results: 12/4 BCx:  12/4 UCx:   12/4 Resp PCR: negative for COVID/influenza  Thank you for allowing pharmacy to be a part of this patient's care.  Netta Cedars PharmD, BCPS 05/13/2020 12:34 AM

## 2020-05-13 NOTE — Progress Notes (Signed)
PROGRESS NOTE  SOLACE WENDORFF GXQ:[id number] DOB: 04-Feb-1946 DOA: 05/12/2020 PCP: Christain Sacramento, MD   LOS: 1 day   Brief narrative: As per HPI,  John [name] is a 74 y.o. male with medical history significant of DLBCL diagnosed during hospitalization for AKI and hypercalcemia early November, underwent induction chemotherapy in a second hospital stay just this past week.  Pt currently on cipro/flagyl for possible diverticulitis seen on CT scan 11/28.  Pt discharged from hospital on 11/29.Pt has pancytopenia from the chemotherapy, just had PRBC and platelet transfusion yesterday. Pt presented to ED with generalized weakness, fever, and ongoing diarrhea.  In the ED WBC was less than 0.1.  Platelet of 23.  Hemoglobin of 8.1.  Chest x-ray showed atelectasis versus infiltrate.  Lactate was elevated at 3.8.  Patient was given cefepime and vancomycin, IV fluids and then was admitted to hospital for febrile neutropenia/sepsis secondary to pneumonia.  Assessment/Plan:  Principal Problem:   Febrile neutropenia (HCC) Active Problems:   Diffuse large B cell lymphoma (HCC)   Sepsis (HCC)   Antineoplastic chemotherapy induced pancytopenia (HCC)  Sepsis and febrile neutropenia likely secondary to pneumonia/mucositis. Patient presented with features of sepsis including tachycardia tachypnea significant leukopenia on presentation with elevated lactic at 3.8.  Patient does have questionable pneumonia on the chest x-ray.  Due to immune compromised status and febrile neutropenia/ possible pneumonia, patient is currently on cefepime Flagyl and vancomycin. Patient received a 3.5 L of IV fluid bolus and is currently on LR at 150 mL/h.  Oncology has been notified for follow-up.  Will repeat lactate in a.m.  Continue neutropenic precautions.  Blood cultures are pending.  MRSA PCR negative.  Procalcitonin elevated at 2.4  Mucositis, oral aphthous ulceration, possible fungal infection.  We will add Magic mouthwash,  Cepacol lozenges.  Complains of difficulty eating due to oral pain.  Could consider antifungals if persistent neutropenia and fever/oncology opinion  Pancytopenia -recently got platelet and PRBC transfusion as outpatient.  Latest hemoglobin of 7.2 with platelet of 22.  Will closely monitor.  Transfuse as necessary.  Patient received Granix x1 yesterday.  Thrombocytopenia.  Platelet count of 22,000.  Will closely monitor.  DLBCL -S/P induction chemotherapy.  Currently pancytopenia.  Renal insufficiency -improved with IV fluids.  Creatinine 1.1 today.  Mild hypokalemia.  Potassium 3.3.  Will replenish with potassium phosphorus IV.  Check levels in a.m.   Hypophosphatemia.  Will replenish with IV potassium phosphate.  DVT prophylaxis: SCDs Start: 05/12/20 2309    Code Status: Full code  Family Communication: None.  Patient stated that that he had spoken with his wife today.  Does not wish for me to call his wife.  Status is: Inpatient  Remains inpatient appropriate because:IV treatments appropriate due to intensity of illness or inability to take PO and Inpatient level of care appropriate due to severity of illness, IV antibiotic, sepsis and febrile neutropenia   Dispo: The patient is from: Home              Anticipated d/c is to: Home              Anticipated d/c date is: 3 days              Patient currently is not medically stable to d/c.   Consultants:  Oncology notified  Procedures:  None  Antibiotics:  . Vancomycin, cefepime and metronidazole 12/4>  Anti-infectives (From admission, onward)    Start     Dose/Rate Route Frequency Ordered  Stop   05/13/20 2100  vancomycin (VANCOREADY) IVPB 1250 mg/250 mL        1,250 mg 166.7 mL/hr over 90 Minutes Intravenous Every 24 hours 05/13/20 0103     05/13/20 1000  ceFEPIme (MAXIPIME) 2 g in sodium chloride 0.9 % 100 mL IVPB        2 g 200 mL/hr over 30 Minutes Intravenous Every 12 hours 05/13/20 0103     05/12/20 2330   metroNIDAZOLE (FLAGYL) tablet 500 mg        500 mg Oral Every 8 hours 05/12/20 2317     05/12/20 2100  vancomycin (VANCOCIN) IVPB 1000 mg/200 mL premix  Status:  Discontinued        1,000 mg 200 mL/hr over 60 Minutes Intravenous  Once 05/12/20 2046 05/12/20 2050   05/12/20 2100  ceFEPIme (MAXIPIME) 2 g in sodium chloride 0.9 % 100 mL IVPB        2 g 200 mL/hr over 30 Minutes Intravenous  Once 05/12/20 2046 05/12/20 2150   05/12/20 2100  vancomycin (VANCOREADY) IVPB 2000 mg/400 mL        2,000 mg 200 mL/hr over 120 Minutes Intravenous  Once 05/12/20 2050 05/12/20 2341      Subjective: Today, patient was seen and examined at bedside.  Patient complains of weakness, oral pain with difficulty eating.  Mild cough but no dyspnea, intermittent chills.  Objective: Vitals:   05/13/20 0730 05/13/20 0744  BP: (!) 149/81 (!) 149/81  Pulse: 95 96  Resp: 14 20  Temp:    SpO2:  98%    Intake/Output Summary (Last 24 hours) at 05/13/2020 0810 Last data filed at 05/13/2020 0419 Gross per 24 hour  Intake 5001.39 ml  Output 700 ml  Net 4301.39 ml   Filed Weights   05/12/20 2035  Weight: 100.6 kg   Body mass index is 30.92 kg/m.   Physical Exam:  GENERAL: Patient is alert awake and oriented. Not in obvious distress, ill appearing HENT: Pallor positive pupils equally reactive to light. Oral mucosa is dry, aphthous ulceration, erythematous rash with ulceration over the palate region with whitish discoloration. NECK: is supple, no gross swelling noted. CHEST: Clear to auscultation. No crackles or wheezes.  Diminished breath sounds bilaterally. CVS: S1 and S2 heard, no murmur. Regular rate and rhythm.  ABDOMEN: Soft, non-tender, bowel sounds are present. EXTREMITIES: No edema. CNS: Cranial nerves are intact. No focal motor deficits. SKIN: warm and dry without rashes.  Data Review: I have personally reviewed the following laboratory data and studies,  CBC: Recent Labs  Lab 05/07/20 0446  05/10/20 0956 05/12/20 2044 05/13/20 0411  WBC 4.4 0.6* <0.1* 0.1*  NEUTROABS 4.2 0.5*  --   --   HGB 6.9* 7.4* 8.1* 7.2*  HCT 19.9* 22.1* 23.3* 20.3*  MCV 86.5 86.7 85.3 84.9  PLT 53* 22* 23* 22*   Basic Metabolic Panel: Recent Labs  Lab 05/07/20 0446 05/10/20 0956 05/12/20 2044 05/12/20 2327 05/13/20 0411  NA 132* 134* 128*  --  130*  K 3.5 3.9 3.4*  --  3.3*  CL 100 102 96*  --  99  CO2 25 24 18*  --  22  GLUCOSE 100* 144* 179*  --  120*  BUN 36* 32* 24*  --  22  CREATININE 1.37* 1.30* 1.38*  --  1.10  CALCIUM 8.8* 8.7* 8.7*  --  8.2*  PHOS  --   --   --  2.0*  --  Liver Function Tests: Recent Labs  Lab 05/07/20 0446 05/10/20 0956 05/12/20 2044 05/13/20 0411  AST 327*  324* 27 29 20   ALT 689*  703* 208* 90* 74*  ALKPHOS 170*  172* 178* 173* 142*  BILITOT 1.7*  2.1* 1.3* 1.3* 0.9  PROT 4.6*  4.7* 4.9* 5.2* 4.6*  ALBUMIN 2.7*  2.7* 2.7* 2.9* 2.5*   No results for input(s): LIPASE, AMYLASE in the last 168 hours. No results for input(s): AMMONIA in the last 168 hours. Cardiac Enzymes: No results for input(s): CKTOTAL, CKMB, CKMBINDEX, TROPONINI in the last 168 hours. BNP (last 3 results) No results for input(s): BNP in the last 8760 hours.  ProBNP (last 3 results) No results for input(s): PROBNP in the last 8760 hours.  CBG: No results for input(s): GLUCAP in the last 168 hours. Recent Results (from the past 240 hour(s))  Blood Culture (routine x 2)     Status: None (Preliminary result)   Collection Time: 05/12/20  8:44 PM   Specimen: BLOOD  Result Value Ref Range Status   Specimen Description   Final    BLOOD RIGHT ANTECUBITAL Performed at [facility], [place] [address] [place], [place] [address]    Special Requests   Final    BOTTLES DRAWN AEROBIC AND ANAEROBIC Blood Culture adequate volume Performed at [facility] [address] [place], [place] [address]    Culture PENDING  Incomplete   Report Status PENDING   Incomplete  Blood Culture (routine x 2)     Status: None (Preliminary result)   Collection Time: 05/12/20  8:49 PM   Specimen: BLOOD  Result Value Ref Range Status   Specimen Description   Final    BLOOD LEFT ANTECUBITAL Performed at [facility], [place] [address] [place], [place] [address]    Special Requests   Final    BOTTLES DRAWN AEROBIC AND ANAEROBIC Blood Culture results may not be optimal due to an excessive volume of blood received in culture bottles Performed at [facility] [address] [place], [place] [address]    Culture PENDING  Incomplete   Report Status PENDING  Incomplete  Resp Panel by RT-PCR (Flu A&B, Covid) Nasopharyngeal Swab     Status: None   Collection Time: 05/12/20 11:32 PM   Specimen: Nasopharyngeal Swab; Nasopharyngeal(NP) swabs in vial transport medium  Result Value Ref Range Status   SARS Coronavirus 2 by RT PCR NEGATIVE NEGATIVE Final    Comment: (NOTE) SARS-CoV-2 target nucleic acids are NOT DETECTED.  The SARS-CoV-2 RNA is generally detectable in upper respiratory specimens during the acute phase of infection. The lowest concentration of SARS-CoV-2 viral copies this assay can detect is 138 copies/mL. A negative result does not preclude SARS-Cov-2 infection and should not be used as the sole basis for treatment or other patient management decisions. A negative result may occur with  improper specimen collection/handling, submission of specimen other than nasopharyngeal swab, presence of viral mutation(s) within the areas targeted by this assay, and inadequate number of viral copies(<138 copies/mL). A negative result must be combined with clinical observations, patient history, and epidemiological information. The expected result is Negative.  Fact Sheet for Patients:  EntrepreneurPulse.com.au  Fact Sheet for Healthcare Providers:  IncredibleEmployment.be  This test is no t yet  approved or cleared by the [place] FDA and  has been authorized for detection and/or diagnosis of SARS-CoV-2 by FDA under an Emergency Use Authorization (EUA). This EUA will remain  in effect (meaning this test can be used) for the duration of the COVID-19 declaration under Section 564(b)(1) of the Act, 21 U.S.C.section 360bbb-3(b)(1), unless the authorization is terminated  or revoked sooner.       Influenza A by PCR NEGATIVE NEGATIVE Final   Influenza B by PCR NEGATIVE NEGATIVE Final    Comment: (NOTE) The Xpert Xpress SARS-CoV-2/FLU/RSV plus assay is intended as an aid in the diagnosis of influenza from Nasopharyngeal swab specimens and should not be used as a sole basis for treatment. Nasal washings and aspirates are unacceptable for Xpert Xpress SARS-CoV-2/FLU/RSV testing.  Fact Sheet for Patients: EntrepreneurPulse.com.au  Fact Sheet for Healthcare Providers: IncredibleEmployment.be  This test is not yet approved or cleared by the [place] FDA and has been authorized for detection and/or diagnosis of SARS-CoV-2 by FDA under an Emergency Use Authorization (EUA). This EUA will remain in effect (meaning this test can be used) for the duration of the COVID-19 declaration under Section 564(b)(1) of the Act, 21 U.S.C. section 360bbb-3(b)(1), unless the authorization is terminated or revoked.  Performed at [facility], [place] [address], [place], [place] [address]      Studies: DG Chest Port 1 View  Result Date: 05/12/2020 CLINICAL DATA:  Sepsis EXAM: PORTABLE CHEST 1 VIEW COMPARISON:  April 20, 2020 FINDINGS: There is a subtle right infrahilar airspace opacity. No pneumothorax. No large pleural effusion. The heart size is stable. Aortic calcifications are noted. The previously demonstrated adenopathy in the upper mediastinum is improved. IMPRESSION: 1. Subtle right infrahilar airspace opacity may represent  atelectasis or infiltrate. 2. Improved mediastinal adenopathy. 3. No pneumothorax or large pleural effusion. Electronically Signed   By: Constance Holster M.D.   On: 05/12/2020 21:16      Flora Lipps, MD  [facility] 05/13/2020

## 2020-05-14 ENCOUNTER — Other Ambulatory Visit: Payer: Self-pay | Admitting: Radiology

## 2020-05-14 ENCOUNTER — Inpatient Hospital Stay: Payer: Medicare HMO

## 2020-05-14 ENCOUNTER — Telehealth: Payer: Self-pay | Admitting: Hematology

## 2020-05-14 ENCOUNTER — Encounter (HOSPITAL_COMMUNITY): Payer: Self-pay | Admitting: Hematology

## 2020-05-14 ENCOUNTER — Telehealth: Payer: Self-pay

## 2020-05-14 DIAGNOSIS — J189 Pneumonia, unspecified organism: Secondary | ICD-10-CM

## 2020-05-14 DIAGNOSIS — D709 Neutropenia, unspecified: Secondary | ICD-10-CM | POA: Diagnosis not present

## 2020-05-14 DIAGNOSIS — R5081 Fever presenting with conditions classified elsewhere: Secondary | ICD-10-CM

## 2020-05-14 LAB — CBC WITH DIFFERENTIAL/PLATELET
Abs Immature Granulocytes: 0.03 10*3/uL (ref 0.00–0.07)
Basophils Absolute: 0 10*3/uL (ref 0.0–0.1)
Basophils Relative: 1 %
Eosinophils Absolute: 0 10*3/uL (ref 0.0–0.5)
Eosinophils Relative: 0 %
HCT: 23.2 % — ABNORMAL LOW (ref 39.0–52.0)
Hemoglobin: 8.1 g/dL — ABNORMAL LOW (ref 13.0–17.0)
Immature Granulocytes: 2 %
Lymphocytes Relative: 5 %
Lymphs Abs: 0.1 10*3/uL — ABNORMAL LOW (ref 0.7–4.0)
MCH: 29.9 pg (ref 26.0–34.0)
MCHC: 34.9 g/dL (ref 30.0–36.0)
MCV: 85.6 fL (ref 80.0–100.0)
Monocytes Absolute: 0.3 10*3/uL (ref 0.1–1.0)
Monocytes Relative: 17 %
Neutro Abs: 1.2 10*3/uL — ABNORMAL LOW (ref 1.7–7.7)
Neutrophils Relative %: 75 %
Platelets: 60 10*3/uL — ABNORMAL LOW (ref 150–400)
RBC: 2.71 MIL/uL — ABNORMAL LOW (ref 4.22–5.81)
RDW: 14.2 % (ref 11.5–15.5)
WBC: 1.6 10*3/uL — ABNORMAL LOW (ref 4.0–10.5)
nRBC: 0 % (ref 0.0–0.2)

## 2020-05-14 LAB — COMPREHENSIVE METABOLIC PANEL
ALT: 68 U/L — ABNORMAL HIGH (ref 0–44)
AST: 30 U/L (ref 15–41)
Albumin: 2.8 g/dL — ABNORMAL LOW (ref 3.5–5.0)
Alkaline Phosphatase: 146 U/L — ABNORMAL HIGH (ref 38–126)
Anion gap: 11 (ref 5–15)
BUN: 17 mg/dL (ref 8–23)
CO2: 21 mmol/L — ABNORMAL LOW (ref 22–32)
Calcium: 8.3 mg/dL — ABNORMAL LOW (ref 8.9–10.3)
Chloride: 100 mmol/L (ref 98–111)
Creatinine, Ser: 1.14 mg/dL (ref 0.61–1.24)
GFR, Estimated: >60 mL/min (ref >60)
Glucose, Bld: 94 mg/dL (ref 70–99)
Potassium: 3.4 mmol/L — ABNORMAL LOW (ref 3.5–5.1)
Sodium: 132 mmol/L — ABNORMAL LOW (ref 135–145)
Total Bilirubin: 1.3 mg/dL — ABNORMAL HIGH (ref 0.3–1.2)
Total Protein: 5.1 g/dL — ABNORMAL LOW (ref 6.5–8.1)

## 2020-05-14 LAB — SURGICAL PATHOLOGY

## 2020-05-14 LAB — URINE CULTURE: Culture: NO GROWTH

## 2020-05-14 LAB — PHOSPHORUS: Phosphorus: 2.5 mg/dL (ref 2.5–4.6)

## 2020-05-14 LAB — MAGNESIUM: Magnesium: 1.6 mg/dL — ABNORMAL LOW (ref 1.7–2.4)

## 2020-05-14 LAB — LACTIC ACID, PLASMA: Lactic Acid, Venous: 1.2 mmol/L (ref 0.5–1.9)

## 2020-05-14 MED ORDER — MAGNESIUM SULFATE 2 GM/50ML IV SOLN
2.0000 g | Freq: Once | INTRAVENOUS | Status: AC
  Administered 2020-05-14: 2 g via INTRAVENOUS
  Filled 2020-05-14: qty 50

## 2020-05-14 MED ORDER — LACTATED RINGERS IV SOLN: INTRAVENOUS | Status: AC

## 2020-05-14 MED ORDER — SODIUM BICARBONATE/SODIUM CHLORIDE MOUTHWASH
Freq: Four times a day (QID) | OROMUCOSAL | Status: DC
  Filled 2020-05-14: qty 1000

## 2020-05-14 MED ORDER — MELATONIN 3 MG PO TABS
6.0000 mg | ORAL_TABLET | Freq: Every evening | ORAL | Status: DC | PRN
  Administered 2020-05-14: 6 mg via ORAL
  Filled 2020-05-14: qty 2

## 2020-05-14 MED ORDER — POTASSIUM CHLORIDE 10 MEQ/100ML IV SOLN
10.0000 meq | INTRAVENOUS | Status: AC
  Administered 2020-05-14 (×4): 10 meq via INTRAVENOUS
  Filled 2020-05-14 (×3): qty 100

## 2020-05-14 NOTE — Telephone Encounter (Signed)
Wife had called and left a vm that we need to cancel todays blood appt as he is in the hospital... AOM

## 2020-05-14 NOTE — Progress Notes (Addendum)
HEMATOLOGY-ONCOLOGY PROGRESS NOTE  SUBJECTIVE: [name] presented to the emergency room with fever and diarrhea.  He was noted to have pancytopenia secondary to recent chemotherapy.  He has received 1 unit of PRBCs and 1 unit platelets so far this admission.  Has remained afebrile over the past 24 hours.  Had a bowel movement this morning which he reported as being somewhat loose.  However, he is not having significant diarrhea.  Reports mucositis which is improving.  Denies nausea and vomiting.  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 Large anterior mediastinal/prevascular space mass/adenopathy. The mass extends superiorly and abuts the upper trachea. 2. Large hypodense lesion in the right lobe of the liver as well as ill-defined splenic hypodense lesions. Further characterization with MRI without and with contrast recommended. The liver lesion is amenable to percutaneous tissue sampling. 3. Extensive mesenteric and retroperitoneal adenopathy. 4. Sigmoid diverticulosis. No bowel obstruction. Normal appendix. 5. Aortic Atherosclerosis (ICD10-I70.0).   04/23/2020 Cancer Staging   Staging form: Hodgkin and Non-Hodgkin Lymphoma, AJCC 8th Edition - Clinical stage from 04/23/2020: Stage IV (Diffuse large B-cell lymphoma) - Signed by Truitt Merle, MD on 04/29/2020   04/23/2020 Initial Biopsy   FINAL MICROSCOPIC DIAGNOSIS:   A. LIVER, RIGHT MASS, NEEDLE CORE BIOPSY:  - Large B-cell lymphoma  - See comment     COMMENT:   The sections show needle core biopsy fragments displaying effacement of  the architecture by a dense infiltrate of primarily large atypical  lymphoid appearing  cells displaying vesicular chromatin and small  nucleoli.  This is associated with apoptosis and brisk mitosis.  The  appearance is diffuse with lack of atypical follicles.  A large battery  of immunohistochemical stains was performed and shows that the atypical  lymphoid cells are positive for LCA, CD20, PAX 5, BCL-2, BCL6, CD5,  MUM-1 (partial).  The atypical lymphoid cells are negative for CD10,  CD30, CD34, CD138, cyclin D1, TdT, and EBV in situ hybridization,  synaptophysin, cytokeratin AE1/AE3, cytokeratin 20, cytokeratin 7,  cytokeratin 5/6, TTF-1, CD56, CDX2, and p40.  There is an admixed  relatively minor population of T-cells as primarily seen with CD3.  The  overall features are consistent with large B-cell lymphoma, ABC subtype.  The lymphomatous process displays expression of CD5.  This phenotype is  seen in 5-10% of mostly de novo large B-cell lymphoma cases or rarely as  a transformation of a low-grade B-cell lymphoma such as small  lymphocytic lymphoma.  Clinical correlation is recommended.    04/25/2020 Imaging   CT AP  IMPRESSION: 1. Development of high-density ascites in the abdomen and pelvis. Findings are most compatible with a small to moderate amount of hemoperitoneum and related to the recent liver biopsy. 2. Extensive lymphadenopathy throughout the abdomen and pelvis with a large mass or nodal mass near the pancreatic head. There are additional lesions involving the liver and spleen. Findings are suggestive for metastatic disease. 3. Development of small bilateral pleural effusions.   These results were called by telephone at the time of interpretation on 04/25/2020 at 1:43 pm to provider ERIC [place] , who verbally acknowledged these results.   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     05/01/2020 -  Chemotherapy   The patient had pegfilgrastim-cbqv (UDENYCA) injection 6 mg, 6 mg, Subcutaneous, Once, 1 of 4 cycles Administration: 6 mg (05/08/2020) DOXOrubicin (ADRIAMYCIN) 22 mg, etoposide (VEPESID) 84 mg, vinCRIStine (ONCOVIN) 0.9 mg in sodium chloride 0.9 % 1,000 mL chemo infusion, , Intravenous, Once, 1 of 4 cycles Administration:  (05/01/2020),  (05/02/2020),  (05/03/2020),  (05/04/2020) ondansetron (ZOFRAN) 8 mg, dexamethasone (DECADRON) 10 mg in sodium chloride 0.9 % 50 mL IVPB, , Intravenous,  Once, 1 of 4 cycles Administration: 18 mg (05/01/2020), 36 mg (05/05/2020), 18 mg (05/03/2020), 18 mg (05/04/2020) methotrexate (PF) 12 mg, hydrocortisone sodium succinate (SOLU-CORTEF) 50 mg in sodium chloride (PF) 0.9 % INTRATHECAL chemo injection, , Intrathecal,  Once, 1 of 1 cycle cyclophosphamide (CYTOXAN) 1,680 mg in sodium chloride 0.9 % 250 mL chemo infusion, 750 mg/m2 = 1,680 mg, Intravenous,  Once, 1 of 4 cycles Administration: 1,680 mg (05/05/2020) riTUXimab-pvvr (RUXIENCE) 800 mg in sodium chloride 0.9 % 250 mL (2.4242 mg/mL) infusion, 375 mg/m2 = 800 mg, Intravenous,  Once, 1 of 4 cycles Administration: 800 mg (05/04/2020)  for chemotherapy treatment.       REVIEW OF SYSTEMS:   Constitutional: Fevers and chills have resolved Eyes: Denies blurriness of vision Ears, nose, mouth, throat, and face: Reports mucositis respiratory: Denies cough, dyspnea or wheezes Cardiovascular: Denies palpitation, chest discomfort Gastrointestinal: Denies nausea and vomiting.  Had loose stool this morning. Skin: Denies abnormal skin rashes Lymphatics: Denies new lymphadenopathy or easy bruising Neurological:Denies numbness, tingling or new weaknesses Behavioral/Psych: Mood is stable, no new changes  Extremities: Reports ongoing lower extremity edema All other systems were reviewed with the patient and are negative.  I have reviewed the past medical history, past surgical history,  social history and family history with the patient and they are unchanged from previous note.   PHYSICAL EXAMINATION: ECOG PERFORMANCE STATUS: 2 - Symptomatic, <50% confined to bed  Vitals:   05/14/20 0154 05/14/20 0552  BP: 125/84 (!) 142/66  Pulse: 95 94  Resp: 20 (!) 22  Temp: 98.9 F (37.2 C) 98.5 F (36.9 C)  SpO2: 97% 97%   Filed Weights   05/12/20 2035  Weight: 100.6 kg    Intake/Output from previous day: 12/05 0701 - 12/06 0700 In: 200 [IV Piggyback:200] Out: 2150 [Urine:2150]  GENERAL:alert, no distress and comfortable SKIN: skin color, texture, turgor are normal, no rashes or significant lesions EYES: normal, Conjunctiva are pink and non-injected, sclera clear OROPHARYNX: Mucositis noted to tongue and oral mucosa LUNGS: clear to auscultation and percussion with normal breathing effort HEART: regular rate & rhythm and no murmurs and trace lower extremity edema ABDOMEN:abdomen soft, non-tender and normal bowel sounds Musculoskeletal:no cyanosis of digits and no clubbing  NEURO: alert & oriented x 3 with fluent speech, no focal motor/sensory deficits  LABORATORY DATA:  I have reviewed the data as listed CMP Latest Ref Rng & Units 05/14/2020 05/13/2020 05/12/2020  Glucose 70 - 99 mg/dL 94 120(H) 179(H)  BUN 8 - 23 mg/dL 17 22 24(H)  Creatinine 0.61 - 1.24 mg/dL 1.14 1.10 1.38(H)  Sodium 135 - 145 mmol/L 132(L) 130(L) 128(L)  Potassium 3.5 - 5.1 mmol/L 3.4(L) 3.3(L) 3.4(L)  Chloride 98 - 111 mmol/L 100 99 96(L)  CO2 22 - 32 mmol/L 21(L) 22 18(L)  Calcium 8.9 - 10.3 mg/dL 8.3(L) 8.2(L) 8.7(L)  Total Protein 6.5 - 8.1 g/dL 5.1(L) 4.6(L) 5.2(L)  Total Bilirubin 0.3 - 1.2 mg/dL 1.3(H) 0.9 1.3(H)  Alkaline Phos 38 - 126 U/L 146(H) 142(H) 173(H)  AST 15 - 41 U/L 30 20 29   ALT 0 - 44 U/L 68(H) 74(H) 90(H)    Lab Results  Component Value Date   WBC 0.1 (LL) 05/13/2020   HGB 7.2 (L) 05/13/2020   HCT 20.3 (L) 05/13/2020   MCV 84.9 05/13/2020   PLT 22 (LL)  05/13/2020   NEUTROABS 0.5 (L) 05/10/2020    CT ABDOMEN PELVIS WO CONTRAST  Result Date: 04/25/2020 CLINICAL DATA:  74 year old with recent biopsy of hepatic mass on 04/23/2020. Abdominal pain with down trending hemoglobin level. EXAM: CT ABDOMEN AND PELVIS WITHOUT CONTRAST TECHNIQUE: Multidetector CT imaging of the abdomen and pelvis was performed following the standard protocol without IV contrast. COMPARISON:  CT abdomen and pelvis 04/20/2020 FINDINGS: Lower chest: New small bilateral pleural effusions. Hepatobiliary: Again noted is a poorly defined central right hepatic lesion that measures at least 7.4 cm. This lesion was recently biopsied. There is new perihepatic fluid along the right hepatic lobe. No acute abnormality to the liver. Pancreas: There is a mass or lymphadenopathy near the pancreatic head and similar to the recent comparison examination. Concern for nodularity near the pancreatic tail region which is similar to the recent comparison examination. Spleen: Poorly defined low-density lesion in the anterior inferior aspect of the spleen and concerning for a splenic lesion. Adrenals/Urinary Tract: Small nodule associated with the right adrenal gland. Mild fullness left adrenal gland. Probable cyst in left kidney upper pole. Negative for hydronephrosis. Fluid in the urinary bladder. Stomach/Bowel: Normal appearance of stomach. No evidence for bowel obstruction focal bowel inflammation. Vascular/Lymphatic: Abdominal aorta measures up to 2.8 cm with atherosclerotic calcifications. Again noted is extensive para-aortic lymphadenopathy. Enlarged lymph nodes in the left iliac nodal chain. Again noted is a large mass or nodal mass in the central abdomen near the pancreatic head that measures up to 6.3 cm. Additional large mesenteric lymph nodes in the right abdomen on sequence 3, image 49. Probable enlarged lymph nodes in gastrohepatic ligament. Splenule or nodal lesion anterior to the spleen on image  21, sequence 3. Reproductive: Prostate is enlarged. Other: There is slightly dense ascites in the lower abdomen and pelvis. Findings are most compatible with hemoperitoneum from recent liver biopsy. Small amount of high-density fluid in the left lateral lower quadrant. Small amount of fluid around the liver. The most dense ascites measures roughly 26 Hounsfield units. Small amount of high-density fluid or blood extending into the right inguinal canal. Negative for free air. Musculoskeletal: Severe anterolisthesis at L4-L5. Multilevel degenerative changes in the lumbar spine. IMPRESSION: 1. Development of high-density ascites in the abdomen and pelvis. Findings are most compatible with a small to moderate amount of hemoperitoneum and related to the recent liver biopsy. 2. Extensive lymphadenopathy throughout the abdomen and pelvis with a large mass or nodal mass near the pancreatic head. There are additional lesions involving the liver and spleen. Findings are suggestive for metastatic disease. 3. Development of small bilateral pleural effusions. These results were called by telephone at the time of interpretation on 04/25/2020 at 1:43 pm to provider ERIC [place] , who verbally acknowledged these results. Electronically Signed   By: Markus Daft M.D.   On: 04/25/2020 13:50   CT ABDOMEN PELVIS WO CONTRAST  Result Date: 04/20/2020 CLINICAL DATA:  74 year old male with nausea vomiting. Epigastric pain. EXAM: CT CHEST, ABDOMEN AND  PELVIS WITHOUT CONTRAST TECHNIQUE: Multidetector CT imaging of the chest, abdomen and pelvis was performed following the standard protocol without IV contrast. COMPARISON:  None. FINDINGS: Evaluation of this exam is limited in the absence of intravenous contrast. CT CHEST FINDINGS Cardiovascular: There is no cardiomegaly or pericardial effusion. Coronary vascular calcification. There is moderate atherosclerotic calcification of the thoracic aorta. The central pulmonary arteries are grossly  unremarkable on this noncontrast CT. Mediastinum/Nodes: Large anterior mediastinal/prevascular space mass/adenopathy abutting the anterior aspect of the aortic arch measuring 6.3 x 4.0 cm in greatest axial dimensions. There is superior extension of the mass into the upper mediastinum. Additional 2.7 x 2.2 cm nodular density in the region of the left hilum may represent portion of the mass or new adenopathy. The esophagus is grossly unremarkable. No mediastinal fluid collection. Lungs/Pleura: The lungs are clear. There is no pleural effusion pneumothorax. The central airways are patent. Musculoskeletal: Degenerative changes of the spine. No acute osseous pathology. CT ABDOMEN PELVIS FINDINGS No intra-abdominal free air or free fluid. Hepatobiliary: There is an 8.6 x 8.2 cm low attenuating mass in the right lobe of the liver which is not characterized on this CT. Further characterization with MRI without and with contrast on a nonemergent/outpatient basis recommended. No intrahepatic biliary ductal dilatation. Probable sludge within the gallbladder. No calcified stone or pericholecystic fluid. Pancreas: No acute inflammation. No pancreatic atrophy or dilatation of the main pancreatic duct Spleen: Indeterminate and ill-defined hypodense lesions within the spleen. There is slight irregularity of the splenic contour. Adrenals/Urinary Tract: Mild thickening of the adrenal glands. There is no hydronephrosis or nephrolithiasis on either side. A 15 mm left renal upper pole hypodense lesion, likely a cyst. The visualized ureters and urinary bladder appear unremarkable. Stomach/Bowel: There is sigmoid diverticulosis without active inflammatory changes. There is no bowel obstruction or active inflammation. The appendix is normal. Vascular/Lymphatic: Advanced aortoiliac atherosclerotic disease. The IVC is unremarkable. No portal venous gas. Extensive mesenteric and retroperitoneal adenopathy. There is a 6.3 x 6.9 cm soft tissue  mass in the mesentery abutting the inferior aspect of the uncinate process of the pancreas. Additional soft tissue mass/adenopathy medial to the ascending colon. Reproductive: Mild prostate enlargement. Other: None Musculoskeletal: Degenerative changes of the spine. 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 No acute osseous pathology. IMPRESSION: 1. Large anterior mediastinal/prevascular space mass/adenopathy. The mass extends superiorly and abuts the upper trachea. 2. Large hypodense lesion in the right lobe of the liver as well as ill-defined splenic hypodense lesions. Further characterization with MRI without and with contrast recommended. The liver lesion is amenable to percutaneous tissue sampling. 3. Extensive mesenteric and retroperitoneal adenopathy. 4. Sigmoid diverticulosis. No bowel obstruction. Normal appendix. 5. Aortic Atherosclerosis (ICD10-I70.0). Electronically Signed   By: Anner Crete M.D.   On: 04/20/2020 21:11   CT Chest Wo Contrast  Result Date: 04/20/2020 CLINICAL DATA:  74 year old male with nausea vomiting. Epigastric pain. EXAM: CT CHEST, ABDOMEN AND PELVIS WITHOUT CONTRAST TECHNIQUE: Multidetector CT imaging of the chest, abdomen and pelvis was performed following the standard protocol without IV contrast. COMPARISON:  None. FINDINGS: Evaluation of this exam is limited in the absence of intravenous contrast. CT CHEST FINDINGS Cardiovascular: There is no cardiomegaly or pericardial effusion. Coronary vascular calcification. There is moderate atherosclerotic calcification of the thoracic aorta. The central pulmonary arteries are grossly unremarkable on this noncontrast CT. Mediastinum/Nodes: Large anterior mediastinal/prevascular space mass/adenopathy abutting the anterior aspect of the aortic arch measuring 6.3 x 4.0 cm in greatest axial dimensions.  There is superior extension of the mass into the upper mediastinum. Additional 2.7 x 2.2 cm nodular density in the  region of the left hilum may represent portion of the mass or new adenopathy. The esophagus is grossly unremarkable. No mediastinal fluid collection. Lungs/Pleura: The lungs are clear. There is no pleural effusion pneumothorax. The central airways are patent. Musculoskeletal: Degenerative changes of the spine. No acute osseous pathology. CT ABDOMEN PELVIS FINDINGS No intra-abdominal free air or free fluid. Hepatobiliary: There is an 8.6 x 8.2 cm low attenuating mass in the right lobe of the liver which is not characterized on this CT. Further characterization with MRI without and with contrast on a nonemergent/outpatient basis recommended. No intrahepatic biliary ductal dilatation. Probable sludge within the gallbladder. No calcified stone or pericholecystic fluid. Pancreas: No acute inflammation. No pancreatic atrophy or dilatation of the main pancreatic duct Spleen: Indeterminate and ill-defined hypodense lesions within the spleen. There is slight irregularity of the splenic contour. Adrenals/Urinary Tract: Mild thickening of the adrenal glands. There is no hydronephrosis or nephrolithiasis on either side. A 15 mm left renal upper pole hypodense lesion, likely a cyst. The visualized ureters and urinary bladder appear unremarkable. Stomach/Bowel: There is sigmoid diverticulosis without active inflammatory changes. There is no bowel obstruction or active inflammation. The appendix is normal. Vascular/Lymphatic: Advanced aortoiliac atherosclerotic disease. The IVC is unremarkable. No portal venous gas. Extensive mesenteric and retroperitoneal adenopathy. There is a 6.3 x 6.9 cm soft tissue mass in the mesentery abutting the inferior aspect of the uncinate process of the pancreas. Additional soft tissue mass/adenopathy medial to the ascending colon. Reproductive: Mild prostate enlargement. Other: None Musculoskeletal: Degenerative changes of the spine. 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 No  acute osseous pathology. IMPRESSION: 1. Large anterior mediastinal/prevascular space mass/adenopathy. The mass extends superiorly and abuts the upper trachea. 2. Large hypodense lesion in the right lobe of the liver as well as ill-defined splenic hypodense lesions. Further characterization with MRI without and with contrast recommended. The liver lesion is amenable to percutaneous tissue sampling. 3. Extensive mesenteric and retroperitoneal adenopathy. 4. Sigmoid diverticulosis. No bowel obstruction. Normal appendix. 5. Aortic Atherosclerosis (ICD10-I70.0). Electronically Signed   By: Anner Crete M.D.   On: 04/20/2020 21:11   CT ABDOMEN PELVIS W CONTRAST  Result Date: 05/06/2020 CLINICAL DATA:  Abdominal pain. Increased liver function tests. Large B-cell lymphoma. Recent liver biopsy with hemoperitoneum. EXAM: CT ABDOMEN AND PELVIS WITH CONTRAST TECHNIQUE: Multidetector CT imaging of the abdomen and pelvis was performed using the standard protocol following bolus administration of intravenous contrast. CONTRAST:  37mL OMNIPAQUE IOHEXOL 300 MG/ML  SOLN COMPARISON:  04/25/2020 FINDINGS: Lower chest: Small left and trace right pleural effusions. Descending thoracic aortic atherosclerosis. Mild mitral valve calcification. Hepatobiliary: Dominant right hepatic lobe lesion 10.8 by 10.4 cm on image 16 of series 2, formerly about 9.1 cm based on my measurement. Mixed internal density favoring internal hematoma. There is also increased prominence of a lobulation along the inferior border possibly also representing hematoma or additional lesion measuring 6.0 by 5.0 cm on image 24 series 2. Dependent density in the gallbladder favoring sludge, blood products, or hematoma. No biliary dilatation. Pancreas: Mesenteric mass mass along the inferior margin of the head of the pancreas measuring 4.5 by 3.7 cm on image 36 of series 2, roughly stable from prior. Spleen: Multiple masses in the spleen are present, little larger  lesions measures 5.0 by 3.9 cm on image 19 of series 2. These were present previously  although much less conspicuous due to the lack of IV contrast. Adrenals/Urinary Tract: Small nodule along the inferior margin of the right adrenal gland measuring 1.0 by 1.3 cm, probably an adenoma based on prior imaging but technically nonspecific on today's exam. Hypodense 2.2 by 2.4 cm lesion in the left mid upper kidney with fluid density, favoring cyst. Urinary bladder unremarkable. Stomach/Bowel: Air-levels in the distal colon favoring diarrheal process. Sigmoid colon diverticulosis. There is stranding along the sigmoid colon such that low-grade diverticulitis cannot be readily excluded. Vascular/Lymphatic: Aortoiliac atherosclerotic vascular disease. Scattered pathologic adenopathy. Adjacent to the pancreatic tail and splenic hilum, there are clustered abnormal lymph nodes with index node measuring 1.8 cm in short axis on image 23 of series 2, formerly 1.6 cm. Portacaval node 1.8 cm in short axis on image 28 series 2, formerly 2.1 cm. Left periaortic node 1.5 cm in short axis on image 36 series 2, formerly 1.7 cm. Left external iliac node 1.4 cm in short axis on image 60 of series 2, formerly the same. Left common iliac node 1.3 cm in short axis on image 51 series 2, formerly 1.8 cm. Other additional enlarged lymph nodes are present, the generally the lymph nodes are mildly reduced in size compared to the prior exam. Reproductive: Prostatomegaly Other: Complex ascites mildly reduced from the prior exam compatible with hemoperitoneum. Musculoskeletal: Bridging spurring of the sacroiliac joints. 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 Mesenteric and subcutaneous edema compatible with third spacing of fluid. IMPRESSION: 1. Mild improvement in the adenopathy in the abdomen and pelvis. 2. Mild  reduction in size of the complex ascites compatible with hemoperitoneum. 3. Large bilobed mass in the liver with internal heterogeneous high density probably from internal hematoma, difficult to compare in size to the prior exam due to low conspicuity on the prior exam. 4. Stable appearance of the mesenteric mass along the inferior margin of the head of the pancreas. 5. Multiple splenic masses, some of which are similar to prior, difficult to compare in size due to low conspicuity on prior noncontrast exam. 6. Air-levels in the distal colon favoring diarrheal process. 7. Sigmoid colon diverticulosis. There is stranding along the sigmoid colon such that low-grade diverticulitis cannot be readily excluded. 8. Small left and trace right pleural effusions. 9. Prostatomegaly. 10. Multilevel lumbar impingement. 11. Small right adrenal nodule, probably an adenoma based on prior imaging but technically nonspecific on today's exam. 12. 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 13. Mesenteric and subcutaneous edema compatible with third spacing of fluid. 14. Aortic atherosclerosis. Aortic Atherosclerosis (ICD10-I70.0). Electronically Signed   By: Van Clines M.D.   On: 05/06/2020 13:56   US RENAL  Result Date: 04/22/2020 CLINICAL DATA:  Acute renal failure. EXAM: RENAL / URINARY TRACT ULTRASOUND COMPLETE COMPARISON:  CT 04/20/2020 FINDINGS: Right Kidney: Renal measurements: 12.7 x 5.4 x 5.5 cm = volume: 195 mL. Echogenicity within normal limits. No mass or hydronephrosis visualized. Left Kidney: Renal measurements: 12.9 x 5.9 x 5.0 cm = volume: 198 mL. Echogenicity within normal limits. No hydronephrosis visualized. Benign-appearing cyst measures 2.4 cm in the upper pole of the left kidney. Bladder: Appears normal for degree of bladder distention. Prevoid volume is 71 cc. Other:  None. IMPRESSION: 1. 2.4 cm benign-appearing left renal cyst. 2. Otherwise normal appearance  of the kidneys and bladder. Electronically Signed   By: Fidela Salisbury M.D.   On: 04/22/2020 10:30   US BIOPSY (LIVER)  Result Date: 04/23/2020 INDICATION: 74 year old male with indeterminate right lobe up attic mass. EXAM: ULTRASOUND GUIDED LIVER LESION BIOPSY COMPARISON:  None. MEDICATIONS: None ANESTHESIA/SEDATION: Fentanyl 75 mcg IV; Versed 1.5 mg IV Total Moderate Sedation time:  13 minutes. The patient's level of consciousness and vital signs were monitored continuously by radiology nursing throughout the procedure under my direct supervision. COMPLICATIONS: None immediate. PROCEDURE: Informed written consent was obtained from the patient after a discussion of the risks, benefits and alternatives to treatment. The patient understands and consents the procedure. A timeout was performed prior to the initiation of the procedure. Ultrasound scanning was performed of the right upper abdominal quadrant demonstrates heterogeneously hypoechoic well-defined mass in the right lobe measuring up to approximately 9 cm. The right lobe mass was selected for biopsy and the procedure was planned. The right upper abdominal quadrant was prepped and draped in the usual sterile fashion. The overlying soft tissues were anesthetized with 1% lidocaine with epinephrine. A 17 gauge, 6.8 cm co-axial needle was advanced into a peripheral aspect of the lesion. This was followed by 3 core biopsies with an 18 gauge core device under direct ultrasound guidance. The coaxial needle tract was embolized with a small amount of Gel-Foam slurry and superficial hemostasis was obtained with manual compression. Post procedural scanning was negative for definitive area of hemorrhage or additional complication. A dressing was placed. The patient tolerated the procedure well without immediate post procedural complication. IMPRESSION: Technically  successful ultrasound guided core needle biopsy of right lobe hepatic mass. Ruthann Cancer, MD [facility] Radiology Electronically Signed   By: Ruthann Cancer MD   On: 04/23/2020 17:40   DG Chest Port 1 View  Result Date: 05/12/2020 CLINICAL DATA:  Sepsis EXAM: PORTABLE CHEST 1 VIEW COMPARISON:  April 20, 2020 FINDINGS: There is a subtle right infrahilar airspace opacity. No pneumothorax. No large pleural effusion. The heart size is stable. Aortic calcifications are noted. The previously demonstrated adenopathy in the upper mediastinum is improved. IMPRESSION: 1. Subtle right infrahilar airspace opacity may represent atelectasis or infiltrate. 2. Improved mediastinal adenopathy. 3. No pneumothorax or large pleural effusion. Electronically Signed   By: Constance Holster M.D.   On: 05/12/2020 21:16   DG Chest Port 1 View  Result Date: 04/20/2020 CLINICAL DATA:  Shortness of breath.  Constipation.  Weakness. EXAM: PORTABLE CHEST 1 VIEW COMPARISON:  06/09/2018 FINDINGS: Altered mediastinal contour compared to the prior exam, I cannot exclude aortic aneurysm, mediastinal mass, or hematoma. CT of the chest (with contrast if feasible) is recommended for further workup. The lungs appear clear.  Thoracic spondylosis noted. IMPRESSION: 1. Abnormal mediastinal contour, substantially changed from 06/09/2018. I cannot exclude aortic aneurysm, mediastinal mass, or hematoma. CT of the chest (with contrast if feasible) is recommended for further workup. 2. Thoracic spondylosis. Electronically Signed   By: Van Clines M.D.   On: 04/20/2020 18:48   DG Abd Portable 1V  Result Date: 05/03/2020 CLINICAL DATA:  Abdominal distension. EXAM: PORTABLE ABDOMEN - 1 VIEW COMPARISON:  CT AP from 04/25/2020 FINDINGS: The bowel gas pattern is normal. No radio-opaque calculi or other significant radiographic abnormality are seen. Lumbar spondylosis identified. IMPRESSION:  Nonobstructive bowel gas pattern. Electronically Signed   By: Kerby Moors M.D.   On: 05/03/2020 08:41   ECHOCARDIOGRAM COMPLETE  Result Date: 04/30/2020  ECHOCARDIOGRAM REPORT   Patient Name:   MADDOXX BURKITT Date of Exam: 04/30/2020 Medical Rec #:  [id number]       Height:       71.0 in Accession #:    [id number]      Weight:       222.7 lb Date of Birth:  [birth year]       BSA:          2.207 m Patient Age:    38 years        BP:           165/78 mmHg Patient Gender: M               HR:           85 bpm. Exam Location:  Inpatient Procedure: 2D Echo, Color Doppler, Cardiac Doppler, Strain Analysis and 3D Echo Indications:    Pre-Chemo Evaluation v67.2  History:        Patient has prior history of Echocardiogram examinations, most                 recent 06/23/2018. Risk Factors:Hypertension.  Sonographer:    Raquel Sarna Senior RDCS Referring Phys: [id number] [place]  1. Left ventricular ejection fraction, by estimation, is 60 to 65%. The left ventricle has normal function. The left ventricle has no regional wall motion abnormalities. There is moderate left ventricular hypertrophy of the basal-septal segment. Left ventricular diastolic parameters are consistent with Grade I diastolic dysfunction (impaired relaxation). Elevated left ventricular end-diastolic pressure. The average left ventricular global longitudinal strain is -18.4 %. The global longitudinal strain  is normal.  2. Right ventricular systolic function is normal. The right ventricular size is normal. There is normal pulmonary artery systolic pressure. The estimated right ventricular systolic pressure is 46.5 mmHg.  3. Left atrial size was severely dilated.  4. The mitral valve is normal in structure. Trivial mitral valve regurgitation. No evidence of mitral stenosis.  5. The aortic valve is normal in structure. Aortic valve regurgitation is not visualized. No aortic stenosis is present.  6. Aortic dilatation noted. There is borderline  dilatation of the aortic root, measuring 37 mm.  7. The inferior vena cava is dilated in size with >50% respiratory variability, suggesting right atrial pressure of 8 mmHg. FINDINGS  Left Ventricle: Left ventricular ejection fraction, by estimation, is 60 to 65%. The left ventricle has normal function. The left ventricle has no regional wall motion abnormalities. The average left ventricular global longitudinal strain is -18.4 %. The global longitudinal strain is normal. The left ventricular internal cavity size was normal in size. There is moderate left ventricular hypertrophy of the basal-septal segment. Left ventricular diastolic parameters are consistent with Grade I diastolic dysfunction (impaired relaxation). Elevated left ventricular end-diastolic pressure. Right Ventricle: The right ventricular size is normal. No increase in right ventricular wall thickness. Right ventricular systolic function is normal. There is normal pulmonary artery systolic pressure. The tricuspid regurgitant velocity is 2.62 m/s, and  with an assumed right atrial pressure of 8 mmHg, the estimated right ventricular systolic pressure is 68.1 mmHg. Left Atrium: Left atrial size was severely dilated. Right Atrium: Right atrial size was normal in size. Pericardium: There is no evidence of pericardial effusion. Mitral Valve: The mitral valve is normal in structure. There is mild calcification of the anterior mitral valve leaflet(s). Mild mitral annular calcification. Trivial mitral valve regurgitation. No evidence of mitral valve stenosis. Tricuspid Valve: The tricuspid valve is normal in structure.  Tricuspid valve regurgitation is trivial. No evidence of tricuspid stenosis. Aortic Valve: The aortic valve is normal in structure. Aortic valve regurgitation is not visualized. No aortic stenosis is present. Pulmonic Valve: The pulmonic valve was normal in structure. Pulmonic valve regurgitation is not visualized. No evidence of pulmonic  stenosis. Aorta: Aortic dilatation noted. There is borderline dilatation of the aortic root, measuring 37 mm. Venous: The inferior vena cava is dilated in size with greater than 50% respiratory variability, suggesting right atrial pressure of 8 mmHg. IAS/Shunts: The interatrial septum appears to be lipomatous. No atrial level shunt detected by color flow Doppler.  LEFT VENTRICLE PLAX 2D LVIDd:         3.90 cm  Diastology LVIDs:         2.60 cm  LV e' medial:    7.40 cm/s LV PW:         1.13 cm  LV E/e' medial:  13.2 LV IVS:        1.60 cm  LV e' lateral:   5.11 cm/s LVOT diam:     1.90 cm  LV E/e' lateral: 19.1 LV SV:         80 LV SV Index:   36       2D Longitudinal Strain LVOT Area:     2.84 cm 2D Strain GLS (A2C):   -17.7 %                         2D Strain GLS (A3C):   -19.8 %                         2D Strain GLS (A4C):   -17.6 %                         2D Strain GLS Avg:     -18.4 %                          3D Volume EF:                         3D EF:        59 %                         LV EDV:       204 ml                         LV ESV:       83 ml                         LV SV:        121 ml RIGHT VENTRICLE RV S prime:     15.80 cm/s TAPSE (M-mode): 2.6 cm LEFT ATRIUM              Index       RIGHT ATRIUM           Index LA diam:        4.40 cm  1.99 cm/m  RA Area:     19.70 cm LA Vol (A2C):   124.0 ml 56.19 ml/m RA Volume:   53.90 ml  24.42 ml/m LA Vol (A4C):   92.1 ml  41.73 ml/m  LA Biplane Vol: 108.0 ml 48.94 ml/m  AORTIC VALVE LVOT Vmax:   134.00 cm/s LVOT Vmean:  85.400 cm/s LVOT VTI:    0.282 m  AORTA Ao Root diam: 3.70 cm Ao Asc diam:  3.30 cm MITRAL VALVE                TRICUSPID VALVE MV Area (PHT): 2.69 cm     TR Peak grad:   27.5 mmHg MV Decel Time: 282 msec     TR Vmax:        262.00 cm/s MV E velocity: 97.70 cm/s MV A velocity: 113.00 cm/s  SHUNTS MV E/A ratio:  0.86         Systemic VTI:  0.28 m                             Systemic Diam: 1.90 cm Fransico Him MD Electronically signed  by Fransico Him MD Signature Date/Time: 04/30/2020/2:29:48 PM    Final    IR PICC PLACEMENT RIGHT >5 YRS INC IMG GUIDE  Result Date: 05/01/2020 INDICATION: 74 year old male in need of a dual lumen PICC for chemotherapy. EXAM: PICC LINE PLACEMENT WITH ULTRASOUND AND FLUOROSCOPIC GUIDANCE MEDICATIONS: None. ANESTHESIA/SEDATION: None. FLUOROSCOPY TIME:  Fluoroscopy Time: 0 minutes 6 seconds (3 mGy). COMPLICATIONS: None immediate. PROCEDURE: The patient was advised of the possible risks and complications and agreed to undergo the procedure. The patient was then brought to the angiographic suite for the procedure. The right arm was prepped with chlorhexidine, draped in the usual sterile fashion using maximum barrier technique (cap and mask, sterile gown, sterile gloves, large sterile sheet, hand hygiene and cutaneous antisepsis) and infiltrated locally with 1% Lidocaine. Ultrasound demonstrated patency of the right basilic vein, and this was documented with an image. Under real-time ultrasound guidance, this vein was accessed with a 21 gauge micropuncture needle and image documentation was performed. A 0.018 wire was introduced in to the vein. Over this, a 6 [place] dual lumen power-injectable PICC was advanced to the lower SVC/right atrial junction. Fluoroscopy during the procedure and fluoro spot radiograph confirms appropriate catheter position. The catheter was flushed and covered with a sterile dressing. Catheter length: 40 cm IMPRESSION: Successful right arm Power PICC line placement with ultrasound and fluoroscopic guidance. The catheter is ready for use. Electronically Signed   By: Jacqulynn Cadet M.D.   On: 05/01/2020 15:56    ASSESSMENT AND PLAN: 1.  Diffuse large B-cell lymphoma, ABC subtype, stage IV 2.  Pancytopenia secondary to chemotherapy 3.  Sepsis, resolved 4.  Febrile neutropenia, resolved 5.  AKI, resolved 6.  Mucositis secondary to recent chemotherapy  -Labs have been reviewed.  He  received G-CSF in our office following his chemotherapy.  WBC is slowly recovering.  Hemoglobin and platelets have improved following transfusions.  No transfusion support needed today.  Recommend PRBC transfusion for hemoglobin less than 8 and platelets less than 20,000 or active bleeding. -The patient is currently afebrile and cultures negative to date.  Continue antibiotics and consider de-escalating if cultures remain negative. -He has mucositis secondary to recent chemotherapy.  Continue Magic mouthwash with lidocaine.  We will also start him on sodium bicarbonate mouth rinses.    LOS: 2 days   Mikey Bussing, DNP, AGPCNP-BC, AOCNP 05/14/20  Addendum I have seen the patient, examined him. I agree with the assessment and and plan and have edited the notes.   Mr. Hennon was admitted for neutropenia, ID  workup has been negative so far, and his counts are recovering nicely. He however dose have significant mouth sores, which may take sometime to heal. I discussed the possibility to post his next cycle chemo for a week,to start on 12/20, will make a final decision later this week.   He is scheduled for port placement by IR tomorrow noon. I am OK to proceed if his [place] >1.5K and cultures remain to be negative by tomorrow. Please let IR know. I al also OK to postpone his port placement if IR prefers.   I will cancel his PET scan this week, will decide if we will keep his LP this Thursday on Wednesday.   Truitt Merle  05/14/2020

## 2020-05-14 NOTE — Telephone Encounter (Signed)
Scheduled appointment per 12/2 los. Spoke to patient's wife who is aware of appointment date and time.

## 2020-05-14 NOTE — Care Management Important Message (Signed)
Important Message  Patient Details IM Letter given to the Patient. Name: John [name] MRN: [id number] Date of Birth: 1946-01-02   Medicare Important Message Given:  Yes     Kerin Salen 05/14/2020, 11:12 AM

## 2020-05-14 NOTE — Progress Notes (Addendum)
PROGRESS NOTE  BRISTOL SOY ZSW:[id number] DOB: 09-11-1945 DOA: 05/12/2020 PCP: Christain Sacramento, MD   LOS: 2 days   Brief narrative: As per HPI,  John [name] is a 74 y.o. male with medical history significant of DLBCL diagnosed during hospitalization for AKI and hypercalcemia early November, underwent induction chemotherapy in a second hospital stay just this past week.  Pt currently on cipro/flagyl for possible diverticulitis seen on CT scan 11/28.  Pt discharged from hospital on 11/29. Pt has pancytopenia from the chemotherapy, just had PRBC and platelet transfusion prior to admission.  Pt presented to ED with generalized weakness, fever, and ongoing diarrhea.  In the ED, WBC was less than 0.1.  Platelet of 23.  Hemoglobin of 8.1.  Chest x-ray showed atelectasis versus infiltrate.  Lactate was elevated at 3.8.  Patient was given cefepime and vancomycin, IV fluids and then was admitted to hospital for febrile neutropenia/sepsis secondary to pneumonia.  Assessment/Plan:  Principal Problem:   Febrile neutropenia (HCC) Active Problems:   Diffuse large B cell lymphoma (HCC)   Sepsis (HCC)   Antineoplastic chemotherapy induced pancytopenia (HCC)  Sepsis and febrile neutropenia likely secondary to right infrahilar pneumonia/mucositis. Patient presented with features of sepsis including tachycardia, tachypnea significant leukopenia on presentation with elevated lactic at 3.8.  Patient does have questionable pneumonia on the chest x-ray.  Due to immune compromised status and febrile neutropenia/ possible pneumonia, patient is currently on cefepime, Flagyl and vancomycin. Patient received a 3.5 L of IV fluid bolus and is currently on LR at 50 mL/h.  Oncology has been notified for follow-up.  Will repeat lactate in a.m.  Continue neutropenic precautions.  Blood cultures negative in 1 day.  Lactate has improved to 1.2.   MRSA PCR negative.  Procalcitonin initially elevated at 2.4.  Continue gentle  IV fluids.  WBC count has improved to 1.6 today.  Temperature max of 98.9 F.  Will de-escalate antibiotic by tomorrow if he remains afebrile and cultures are negative.  Mucositis, oral aphthous ulceration, continue Magic mouthwash, sodium bicarb rinse.  Seen by oncology.  Pancytopenia -recently got platelet and PRBC transfusion as outpatient.  Latest hemoglobin of 8.1 with platelet of 60.  Improving. Patient received Granix x1 on admission.  DLBCL -S/P induction chemotherapy.  Currently pancytopenic.  Renal insufficiency -improved with IV fluids.  Creatinine 1.1 today.  Mild hypokalemia.  Potassium 3.4.  We will continue to replenish.  We will give 40 mEq of IV potassium.   Hypophosphatemia.  Improved with replacement.  Check levels in a.m.  Hypomagnesemia.  Will replace IV magnesium sulfate.  Check levels in a.m.  DVT prophylaxis: SCDs Start: 05/12/20 2309   Code Status: Full code  Family Communication: Spoke with the patient at bedside.  Patient's wife present at bedside as well  Status is: Inpatient  Remains inpatient appropriate because:IV treatments appropriate due to intensity of illness or inability to take PO and Inpatient level of care appropriate due to severity of illness, IV antibiotic, sepsis and febrile neutropenia   Dispo: The patient is from: Home              Anticipated d/c is to: Home              Anticipated d/c date is: 1 to 2 days if afebrile and continues to improve.              Patient currently is not medically stable to d/c.   Consultants:  Oncology   Procedures:  None  Antibiotics:  . Vancomycin, cefepime and metronidazole 12/4>  Anti-infectives (From admission, onward)   Start     Dose/Rate Route Frequency Ordered Stop   05/13/20 2100  vancomycin (VANCOREADY) IVPB 1250 mg/250 mL        1,250 mg 166.7 mL/hr over 90 Minutes Intravenous Every 24 hours 05/13/20 0103     05/13/20 1600  ceFEPIme (MAXIPIME) 2 g in sodium chloride 0.9 % 100 mL  IVPB        2 g 200 mL/hr over 30 Minutes Intravenous Every 8 hours 05/13/20 1023     05/13/20 1000  ceFEPIme (MAXIPIME) 2 g in sodium chloride 0.9 % 100 mL IVPB  Status:  Discontinued        2 g 200 mL/hr over 30 Minutes Intravenous Every 12 hours 05/13/20 0103 05/13/20 1023   05/12/20 2330  metroNIDAZOLE (FLAGYL) tablet 500 mg        500 mg Oral Every 8 hours 05/12/20 2317     05/12/20 2100  vancomycin (VANCOCIN) IVPB 1000 mg/200 mL premix  Status:  Discontinued        1,000 mg 200 mL/hr over 60 Minutes Intravenous  Once 05/12/20 2046 05/12/20 2050   05/12/20 2100  ceFEPIme (MAXIPIME) 2 g in sodium chloride 0.9 % 100 mL IVPB        2 g 200 mL/hr over 30 Minutes Intravenous  Once 05/12/20 2046 05/12/20 2150   05/12/20 2100  vancomycin (VANCOREADY) IVPB 2000 mg/400 mL        2,000 mg 200 mL/hr over 120 Minutes Intravenous  Once 05/12/20 2050 05/12/20 2341     Subjective: Today, patient was seen and examined at bedside.  Feels better than yesterday overall.  Feels increased energy improved lip area erythematous.  Trying to drink fluids.  No cough shortness of breath nausea vomiting  Objective: Vitals:   05/14/20 0154 05/14/20 0552  BP: 125/84 (!) 142/66  Pulse: 95 94  Resp: 20 (!) 22  Temp: 98.9 F (37.2 C) 98.5 F (36.9 C)  SpO2: 97% 97%    Intake/Output Summary (Last 24 hours) at 05/14/2020 0819 Last data filed at 05/14/2020 0602 Gross per 24 hour  Intake 200 ml  Output 2150 ml  Net -1950 ml   Filed Weights   05/12/20 2035  Weight: 100.6 kg   Body mass index is 30.92 kg/m.   Physical Exam:  GENERAL: Patient is alert awake and oriented. Not in obvious distress, communicative. HENT: Pallor positive pupils equally reactive to light. Oral mucosa is dry, aphthous ulceration, erythematous rash with ulceration over the palate region with whitish discoloration. NECK: is supple, no gross swelling noted. CHEST: Clear to auscultation. No crackles or wheezes.  Diminished  breath sounds bilaterally. CVS: S1 and S2 heard, no murmur. Regular rate and rhythm.  ABDOMEN: Soft, non-tender, bowel sounds are present.  EXTREMITIES: No edema. CNS: Cranial nerves are intact. No focal motor deficits. SKIN: warm and dry without rashes.  Oral mucosa with ulcerations  Data Review: I have personally reviewed the following laboratory data and studies,  CBC: Recent Labs  Lab 05/10/20 0956 05/12/20 2044 05/13/20 0411 05/14/20 0721  WBC 0.6* <0.1* 0.1* 1.6*  NEUTROABS 0.5*  --   --  1.2*  HGB 7.4* 8.1* 7.2* 8.1*  HCT 22.1* 23.3* 20.3* 23.2*  MCV 86.7 85.3 84.9 85.6  PLT 22* 23* 22* 60*   Basic Metabolic Panel: Recent Labs  Lab 05/10/20 0956 05/12/20 2044 05/12/20 2327 05/13/20 0411 05/14/20 0549  NA 134* 128*  --  130* 132*  K 3.9 3.4*  --  3.3* 3.4*  CL 102 96*  --  99 100  CO2 24 18*  --  22 21*  GLUCOSE 144* 179*  --  120* 94  BUN 32* 24*  --  22 17  CREATININE 1.30* 1.38*  --  1.10 1.14  CALCIUM 8.7* 8.7*  --  8.2* 8.3*  MG  --   --   --   --  1.6*  PHOS  --   --  2.0*  --  2.5   Liver Function Tests: Recent Labs  Lab 05/10/20 0956 05/12/20 2044 05/13/20 0411 05/14/20 0549  AST 27 29 20 30   ALT 208* 90* 74* 68*  ALKPHOS 178* 173* 142* 146*  BILITOT 1.3* 1.3* 0.9 1.3*  PROT 4.9* 5.2* 4.6* 5.1*  ALBUMIN 2.7* 2.9* 2.5* 2.8*   No results for input(s): LIPASE, AMYLASE in the last 168 hours. No results for input(s): AMMONIA in the last 168 hours. Cardiac Enzymes: No results for input(s): CKTOTAL, CKMB, CKMBINDEX, TROPONINI in the last 168 hours. BNP (last 3 results) No results for input(s): BNP in the last 8760 hours.  ProBNP (last 3 results) No results for input(s): PROBNP in the last 8760 hours.  CBG: No results for input(s): GLUCAP in the last 168 hours. Recent Results (from the past 240 hour(s))  Blood Culture (routine x 2)     Status: None (Preliminary result)   Collection Time: 05/12/20  8:44 PM   Specimen: BLOOD  Result Value  Ref Range Status   Specimen Description   Final    BLOOD RIGHT ANTECUBITAL Performed at [facility], [place] [address], [place], [place] [address]    Special Requests   Final    BOTTLES DRAWN AEROBIC AND ANAEROBIC Blood Culture adequate volume Performed at [place] [address], [place], [place] [address]    Culture   Final    NO GROWTH 1 DAY Performed at [facility], [place] [address], [place], [place] [address]    Report Status PENDING  Incomplete  Blood Culture (routine x 2)     Status: None (Preliminary result)   Collection Time: 05/12/20  8:49 PM   Specimen: BLOOD  Result Value Ref Range Status   Specimen Description   Final    BLOOD LEFT ANTECUBITAL Performed at [facility], [place] [address], [place], [place] [address]    Special Requests   Final    BOTTLES DRAWN AEROBIC AND ANAEROBIC Blood Culture results may not be optimal due to an excessive volume of blood received in culture bottles Performed at [place] [address], [place], [place] [address]    Culture   Final    NO GROWTH 1 DAY Performed at [facility], [place] [address], [place],  [address]    Report Status PENDING  Incomplete  Resp Panel by RT-PCR (Flu A&B, Covid) Nasopharyngeal Swab     Status: None   Collection Time: 05/12/20 11:32 PM   Specimen: Nasopharyngeal Swab; Nasopharyngeal(NP) swabs in vial transport medium  Result Value Ref Range Status   SARS Coronavirus 2 by RT PCR NEGATIVE NEGATIVE Final    Comment: (NOTE) SARS-CoV-2 target nucleic acids are NOT DETECTED.  The SARS-CoV-2 RNA is generally detectable in upper respiratory specimens during the acute phase of infection. The lowest concentration of SARS-CoV-2 viral copies this assay can detect is 138 copies/mL. A  negative result does not preclude SARS-Cov-2 infection and should not be used as the sole basis for treatment or other patient management decisions. A negative  result may occur with  improper specimen collection/handling, submission of specimen other than nasopharyngeal swab, presence of viral mutation(s) within the areas targeted by this assay, and inadequate number of viral copies(<138 copies/mL). A negative result must be combined with clinical observations, patient history, and epidemiological information. The expected result is Negative.  Fact Sheet for Patients:  EntrepreneurPulse.com.au  Fact Sheet for Healthcare Providers:  IncredibleEmployment.be  This test is no t yet approved or cleared by the [place] FDA and  has been authorized for detection and/or diagnosis of SARS-CoV-2 by FDA under an Emergency Use Authorization (EUA). This EUA will remain  in effect (meaning this test can be used) for the duration of the COVID-19 declaration under Section 564(b)(1) of the Act, 21 U.S.C.section 360bbb-3(b)(1), unless the authorization is terminated  or revoked sooner.       Influenza A by PCR NEGATIVE NEGATIVE Final   Influenza B by PCR NEGATIVE NEGATIVE Final    Comment: (NOTE) The Xpert Xpress SARS-CoV-2/FLU/RSV plus assay is intended as an aid in the diagnosis of influenza from Nasopharyngeal swab specimens and should not be used as a sole basis for treatment. Nasal washings and aspirates are unacceptable for Xpert Xpress SARS-CoV-2/FLU/RSV testing.  Fact Sheet for Patients: EntrepreneurPulse.com.au  Fact Sheet for Healthcare Providers: IncredibleEmployment.be  This test is not yet approved or cleared by the [place] FDA and has been authorized for detection and/or diagnosis of SARS-CoV-2 by FDA under an Emergency Use Authorization (EUA). This EUA will remain in effect (meaning this test can be used) for the duration of the COVID-19 declaration under Section 564(b)(1) of the Act, 21 U.S.C. section 360bbb-3(b)(1), unless the authorization is  terminated or revoked.  Performed at [facility], [place] [address] [place], [place] [address]   MRSA PCR Screening     Status: None   Collection Time: 05/13/20 10:24 AM   Specimen: Nasopharyngeal  Result Value Ref Range Status   MRSA by PCR NEGATIVE NEGATIVE Final    Comment:        The GeneXpert MRSA Assay (FDA approved for NASAL specimens only), is one component of a comprehensive MRSA colonization surveillance program. It is not intended to diagnose MRSA infection nor to guide or monitor treatment for MRSA infections. Performed at [facility], [place] [address] [place], [place] [address]      Studies: DG Chest Port 1 View  Result Date: 05/12/2020 CLINICAL DATA:  Sepsis EXAM: PORTABLE CHEST 1 VIEW COMPARISON:  April 20, 2020 FINDINGS: There is a subtle right infrahilar airspace opacity. No pneumothorax. No large pleural effusion. The heart size is stable. Aortic calcifications are noted. The previously demonstrated adenopathy in the upper mediastinum is improved. IMPRESSION: 1. Subtle right infrahilar airspace opacity may represent atelectasis or infiltrate. 2. Improved mediastinal adenopathy. 3. No pneumothorax or large pleural effusion. Electronically Signed   By: Constance Holster M.D.   On: 05/12/2020 21:16      Flora Lipps, MD  [facility] 05/14/2020

## 2020-05-15 ENCOUNTER — Ambulatory Visit (HOSPITAL_COMMUNITY): Payer: Medicare HMO

## 2020-05-15 ENCOUNTER — Inpatient Hospital Stay (HOSPITAL_COMMUNITY): Admission: RE | Admit: 2020-05-15 | Payer: Medicare HMO | Source: Ambulatory Visit

## 2020-05-15 ENCOUNTER — Other Ambulatory Visit: Payer: Self-pay | Admitting: Pain Medicine

## 2020-05-15 DIAGNOSIS — D709 Neutropenia, unspecified: Secondary | ICD-10-CM | POA: Diagnosis not present

## 2020-05-15 DIAGNOSIS — R5081 Fever presenting with conditions classified elsewhere: Secondary | ICD-10-CM | POA: Diagnosis not present

## 2020-05-15 DIAGNOSIS — K1230 Oral mucositis (ulcerative), unspecified: Secondary | ICD-10-CM

## 2020-05-15 LAB — CBC WITH DIFFERENTIAL/PLATELET
Abs Immature Granulocytes: 1.53 10*3/uL — ABNORMAL HIGH (ref 0.00–0.07)
Basophils Absolute: 0.1 10*3/uL (ref 0.0–0.1)
Basophils Relative: 1 %
Eosinophils Absolute: 0 10*3/uL (ref 0.0–0.5)
Eosinophils Relative: 0 %
HCT: 23.9 % — ABNORMAL LOW (ref 39.0–52.0)
Hemoglobin: 8.2 g/dL — ABNORMAL LOW (ref 13.0–17.0)
Immature Granulocytes: 20 %
Lymphocytes Relative: 2 %
Lymphs Abs: 0.2 10*3/uL — ABNORMAL LOW (ref 0.7–4.0)
MCH: 29.4 pg (ref 26.0–34.0)
MCHC: 34.3 g/dL (ref 30.0–36.0)
MCV: 85.7 fL (ref 80.0–100.0)
Monocytes Absolute: 1 10*3/uL (ref 0.1–1.0)
Monocytes Relative: 12 %
Neutro Abs: 5 10*3/uL (ref 1.7–7.7)
Neutrophils Relative %: 65 %
Platelets: 92 10*3/uL — ABNORMAL LOW (ref 150–400)
RBC: 2.79 MIL/uL — ABNORMAL LOW (ref 4.22–5.81)
RDW: 14.4 % (ref 11.5–15.5)
WBC: 7.8 10*3/uL (ref 4.0–10.5)
nRBC: 0 % (ref 0.0–0.2)

## 2020-05-15 LAB — COMPREHENSIVE METABOLIC PANEL
ALT: 62 U/L — ABNORMAL HIGH (ref 0–44)
AST: 35 U/L (ref 15–41)
Albumin: 2.7 g/dL — ABNORMAL LOW (ref 3.5–5.0)
Alkaline Phosphatase: 173 U/L — ABNORMAL HIGH (ref 38–126)
Anion gap: 11 (ref 5–15)
BUN: 14 mg/dL (ref 8–23)
CO2: 21 mmol/L — ABNORMAL LOW (ref 22–32)
Calcium: 8.1 mg/dL — ABNORMAL LOW (ref 8.9–10.3)
Chloride: 101 mmol/L (ref 98–111)
Creatinine, Ser: 1.08 mg/dL (ref 0.61–1.24)
GFR, Estimated: >60 mL/min (ref >60)
Glucose, Bld: 94 mg/dL (ref 70–99)
Potassium: 3.3 mmol/L — ABNORMAL LOW (ref 3.5–5.1)
Sodium: 133 mmol/L — ABNORMAL LOW (ref 135–145)
Total Bilirubin: 1.2 mg/dL (ref 0.3–1.2)
Total Protein: 5 g/dL — ABNORMAL LOW (ref 6.5–8.1)

## 2020-05-15 LAB — MAGNESIUM: Magnesium: 1.8 mg/dL (ref 1.7–2.4)

## 2020-05-15 LAB — PHOSPHORUS: Phosphorus: 2.2 mg/dL — ABNORMAL LOW (ref 2.5–4.6)

## 2020-05-15 MED ORDER — CIPROFLOXACIN HCL 500 MG PO TABS: 500.0000 mg | ORAL_TABLET | Freq: Two times a day (BID) | ORAL | 0 refills | Status: AC

## 2020-05-15 MED ORDER — MELATONIN 3 MG PO TABS
6.0000 mg | ORAL_TABLET | Freq: Every evening | ORAL | 0 refills | Status: DC | PRN
Start: 2020-05-15 — End: 2021-01-10

## 2020-05-15 MED ORDER — POTASSIUM CHLORIDE CRYS ER 20 MEQ PO TBCR
40.0000 meq | EXTENDED_RELEASE_TABLET | Freq: Once | ORAL | Status: AC
  Administered 2020-05-15: 40 meq via ORAL
  Filled 2020-05-15: qty 2

## 2020-05-15 MED ORDER — K PHOS MONO-SOD PHOS DI & MONO 155-852-130 MG PO TABS
500.0000 mg | ORAL_TABLET | Freq: Once | ORAL | Status: AC
  Administered 2020-05-15: 500 mg via ORAL
  Filled 2020-05-15: qty 2

## 2020-05-15 MED ORDER — MAGIC MOUTHWASH W/LIDOCAINE: 15.0000 mL | Freq: Four times a day (QID) | ORAL | 0 refills | Status: DC | PRN

## 2020-05-15 MED ORDER — MENTHOL 3 MG MT LOZG: 1.0000 | LOZENGE | OROMUCOSAL | 0 refills | Status: DC | PRN

## 2020-05-15 NOTE — Evaluation (Signed)
Physical Therapy Evaluation Patient Details Name: John [name] MRN: [id number] DOB: 1946-01-04 Today's Date: 05/15/2020   History of Present Illness  Pt is 74 yo male with PMH including diffuse large B cell lymphoma and on chemotherapy.  Pt admitted with febrile neutropenia, sepsis, Pneumonia/mucositis.  Clinical Impression  Pt admitted with above diagnosis. He is normally independent and active in community.  Has had slight decline in past few weeks due to starting chemo.  He was able to transfer and ambulate 350' safely.  He had some shortness of breath but sats remained 98% and pt able to maintain conversation while walking.  Pt has 24 hr support and necessary DME at home.  Pt has no acute PT needs nor immediate needs at discharge.  Discussed could pursue outpt PT if he felt like he was not returning to his baseline with normal activity.    Follow Up Recommendations No PT follow up    Equipment Recommendations  None recommended by PT    Recommendations for Other Services       Precautions / Restrictions Precautions Precautions: Other (comment) Precaution Comments: protective - low WBC      Mobility  Bed Mobility Overal bed mobility: Needs Assistance Bed Mobility: Supine to Sit;Sit to Supine     Supine to sit: Supervision Sit to supine: Supervision        Transfers Overall transfer level: Needs assistance Equipment used: None Transfers: Sit to/from Stand Sit to Stand: Supervision         General transfer comment: Demonstrated good safety awareness - even of lines/leads ; performed x 2  Ambulation/Gait Ambulation/Gait assistance: Min guard;Supervision Gait Distance (Feet): 350 Feet Assistive device: IV Pole;None Gait Pattern/deviations: Step-through pattern Gait velocity: decreased   General Gait Details: Steady gait; able to manage IV pole and ambulate without hole IV pole; no LOB; started min guard progressed to supervision  Stairs             Wheelchair Mobility    Modified Rankin (Stroke Patients Only)       Balance Overall balance assessment: Needs assistance Sitting-balance support: No upper extremity supported Sitting balance-Leahy Scale: Normal     Standing balance support: No upper extremity supported Standing balance-Leahy Scale: Good Standing balance comment: Able to navigate around obstacles, mangage IV pole, reach outside BOS to plug up IV pole and perform ADLs in standing                             Pertinent Vitals/Pain Pain Assessment: No/denies pain    Home Living Family/patient expects to be discharged to:: Private residence Living Arrangements: Spouse/significant other Available Help at Discharge: Family;Available 24 hours/day Type of Home: House Home Access: Stairs to enter Entrance Stairs-Rails: Can reach both Entrance Stairs-Number of Steps: 4-5 with B rails Home Layout: One level Home Equipment: Tub bench;Walker - 2 wheels;Bedside commode      Prior Function Level of Independence: Independent         Comments: no falls; active in the community     Hand Dominance   Dominant Hand: Right    Extremity/Trunk Assessment   Upper Extremity Assessment Upper Extremity Assessment: Overall WFL for tasks assessed    Lower Extremity Assessment Lower Extremity Assessment: Overall WFL for tasks assessed    Cervical / Trunk Assessment Cervical / Trunk Assessment: Normal  Communication   Communication: No difficulties  Cognition Arousal/Alertness: Awake/alert Behavior During Therapy: WFL for tasks assessed/performed Overall  Cognitive Status: Within Functional Limits for tasks assessed                                        General Comments General comments (skin integrity, edema, etc.): Pt did have some shortness of breath with ambulation but HR 90's and O2 sats 98% on RA.  Educated on PT role and POC.  Pt agrees no acute PT needs.  Safe to ambulate with  family.  Did discuss if he felt that he was not returning to baseline endurance/strength could pursue outpt PT. Pt familiar with this process (wife used to work for an outpt clinic)    Exercises     Assessment/Plan    PT Assessment Patent does not need any further PT services  PT Problem List         PT Treatment Interventions      PT Goals (Current goals can be found in the Care Plan section)  Acute Rehab PT Goals Patient Stated Goal: go home today PT Goal Formulation: All assessment and education complete, DC therapy Potential to Achieve Goals: Good    Frequency     Barriers to discharge        Co-evaluation               AM-PAC PT "6 Clicks" Mobility  Outcome Measure Help needed turning from your back to your side while in a flat bed without using bedrails?: A Little Help needed moving from lying on your back to sitting on the side of a flat bed without using bedrails?: A Little Help needed moving to and from a bed to a chair (including a wheelchair)?: A Little Help needed standing up from a chair using your arms (e.g., wheelchair or bedside chair)?: A Little Help needed to walk in hospital room?: A Little Help needed climbing 3-5 steps with a railing? : A Little 6 Click Score: 18    End of Session Equipment Utilized During Treatment: Gait belt Activity Tolerance: Patient tolerated treatment well Patient left: in chair;with call bell/phone within reach Nurse Communication: Mobility status      Time: 1025-1055 PT Time Calculation (min) (ACUTE ONLY): 30 min   Charges:   PT Evaluation $PT Eval Low Complexity: 1 Low PT Treatments $Therapeutic Activity: 8-22 mins        Abran Richard, PT Acute Rehab Services Pager [phone] [facility] 05/15/2020, 11:05 AM

## 2020-05-15 NOTE — Progress Notes (Signed)
HEMATOLOGY-ONCOLOGY PROGRESS NOTE  SUBJECTIVE: Reports that he feels better this morning.  Wants to go home.  He states that he did not sleep very well last night but otherwise is feeling well.  Reports ongoing mild pain due to mucositis.  However, he is eating well.  Denies nausea and vomiting. Remains afebrile.  Work with PT this morning and was able to ambulate the entire length of the unit.  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 Large anterior mediastinal/prevascular space mass/adenopathy. The mass extends superiorly and abuts the upper trachea. 2. Large hypodense lesion in the right lobe of the liver as well as ill-defined splenic hypodense lesions. Further characterization with MRI without and with contrast recommended. The liver lesion is amenable to percutaneous tissue sampling. 3. Extensive mesenteric and retroperitoneal adenopathy. 4. Sigmoid diverticulosis. No bowel obstruction. Normal appendix. 5. Aortic Atherosclerosis (ICD10-I70.0).   04/23/2020 Cancer Staging   Staging form: Hodgkin and Non-Hodgkin Lymphoma, AJCC 8th Edition - Clinical stage from 04/23/2020: Stage IV (Diffuse large B-cell lymphoma) - Signed by Truitt Merle, MD on 04/29/2020   04/23/2020 Initial Biopsy   FINAL MICROSCOPIC DIAGNOSIS:   A. LIVER, RIGHT MASS, NEEDLE CORE BIOPSY:  - Large B-cell lymphoma  - See comment     COMMENT:   The sections show needle core biopsy fragments displaying effacement of  the architecture by a dense infiltrate of primarily large atypical  lymphoid appearing cells displaying vesicular chromatin and small  nucleoli.  This is associated with apoptosis and brisk  mitosis.  The  appearance is diffuse with lack of atypical follicles.  A large battery  of immunohistochemical stains was performed and shows that the atypical  lymphoid cells are positive for LCA, CD20, PAX 5, BCL-2, BCL6, CD5,  MUM-1 (partial).  The atypical lymphoid cells are negative for CD10,  CD30, CD34, CD138, cyclin D1, TdT, and EBV in situ hybridization,  synaptophysin, cytokeratin AE1/AE3, cytokeratin 20, cytokeratin 7,  cytokeratin 5/6, TTF-1, CD56, CDX2, and p40.  There is an admixed  relatively minor population of T-cells as primarily seen with CD3.  The  overall features are consistent with large B-cell lymphoma, ABC subtype.  The lymphomatous process displays expression of CD5.  This phenotype is  seen in 5-10% of mostly de novo large B-cell lymphoma cases or rarely as  a transformation of a low-grade B-cell lymphoma such as small  lymphocytic lymphoma.  Clinical correlation is recommended.    04/25/2020 Imaging   CT AP  IMPRESSION: 1. Development of high-density ascites in the abdomen and pelvis. Findings are most compatible with a small to moderate amount of hemoperitoneum and related to the recent liver biopsy. 2. Extensive lymphadenopathy throughout the abdomen and pelvis with a large mass or nodal mass near the pancreatic head. There are additional lesions involving the liver and spleen. Findings are suggestive for metastatic disease. 3. Development of small bilateral pleural effusions.   These results were called by telephone at the time of interpretation on 04/25/2020 at 1:43 pm to provider ERIC [place] , who verbally acknowledged these results.   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    05/01/2020 -  Chemotherapy   The patient had pegfilgrastim-cbqv (UDENYCA) injection 6 mg, 6 mg,  Subcutaneous, Once, 1 of 4 cycles Administration: 6 mg (05/08/2020) DOXOrubicin (ADRIAMYCIN) 22 mg, etoposide (VEPESID) 84 mg, vinCRIStine (ONCOVIN) 0.9 mg in sodium chloride 0.9 % 1,000 mL chemo infusion, , Intravenous, Once, 1 of 4 cycles Administration:  (05/01/2020),  (05/02/2020),  (05/03/2020),  (05/04/2020) ondansetron (ZOFRAN) 8 mg, dexamethasone (DECADRON) 10 mg in sodium chloride 0.9 % 50 mL IVPB, , Intravenous,  Once, 1 of 4 cycles Administration: 18 mg (05/01/2020), 36 mg (05/05/2020), 18 mg (05/03/2020), 18 mg (05/04/2020) methotrexate (PF) 12 mg, hydrocortisone sodium succinate (SOLU-CORTEF) 50 mg in sodium chloride (PF) 0.9 % INTRATHECAL chemo injection, , Intrathecal,  Once, 1 of 1 cycle cyclophosphamide (CYTOXAN) 1,680 mg in sodium chloride 0.9 % 250 mL chemo infusion, 750 mg/m2 = 1,680 mg, Intravenous,  Once, 1 of 4 cycles Administration: 1,680 mg (05/05/2020) riTUXimab-pvvr (RUXIENCE) 800 mg in sodium chloride 0.9 % 250 mL (2.4242 mg/mL) infusion, 375 mg/m2 = 800 mg, Intravenous,  Once, 1 of 4 cycles Administration: 800 mg (05/04/2020)  for chemotherapy treatment.       REVIEW OF SYSTEMS:   Constitutional: Fevers and chills have resolved Eyes: Denies blurriness of vision Ears, nose, mouth, throat, and face: Reports mucositis Respiratory: Denies cough, dyspnea or wheezes Cardiovascular: Denies palpitation, chest discomfort Gastrointestinal: Denies nausea and vomiting.  Had loose stool this morning. Skin: Denies abnormal skin rashes Lymphatics: Denies new lymphadenopathy or easy bruising Neurological:Denies numbness, tingling or new weaknesses Behavioral/Psych: Mood is stable, no new changes  Extremities: Reports ongoing lower extremity edema All other systems were reviewed with the patient and are negative.  I have reviewed the past medical history, past surgical history, social history and family history with the patient and they are unchanged from previous  note.   PHYSICAL EXAMINATION: ECOG PERFORMANCE STATUS: 2 - Symptomatic, <50% confined to bed  Vitals:   05/14/20 2146 05/15/20 0419  BP: 128/62 (!) 149/73  Pulse: 96 96  Resp: 18 18  Temp: 99.1 F (37.3 C) 98.1 F (36.7 C)  SpO2: 95% 95%   Filed Weights   05/12/20 2035  Weight: 100.6 kg    Intake/Output from previous day: 12/06 0701 - 12/07 0700 In: 870 [P.O.:120; I.V.:400; IV Piggyback:350] Out: 0960 [Urine:1650]  GENERAL:alert, no distress and comfortable SKIN: skin color, texture, turgor are normal, no rashes or significant lesions EYES: normal, Conjunctiva are pink and non-injected, sclera clear OROPHARYNX: Mucositis noted to tongue and oral mucosa LUNGS: clear to auscultation and percussion with normal breathing effort HEART: regular rate & rhythm and no murmurs and trace lower extremity edema ABDOMEN:abdomen soft, non-tender and normal bowel sounds Musculoskeletal:no cyanosis of digits and no clubbing  NEURO: alert & oriented x 3 with fluent speech, no focal motor/sensory deficits  LABORATORY DATA:  I have reviewed the data as listed CMP Latest Ref Rng & Units 05/15/2020 05/14/2020 05/13/2020  Glucose 70 - 99 mg/dL 94 94 120(H)  BUN 8 - 23 mg/dL 14 17 22   Creatinine 0.61 - 1.24 mg/dL 1.08 1.14 1.10  Sodium 135 - 145 mmol/L 133(L) 132(L) 130(L)  Potassium 3.5 - 5.1 mmol/L 3.3(L) 3.4(L) 3.3(L)  Chloride 98 - 111 mmol/L 101 100 99  CO2 22 - 32 mmol/L 21(L) 21(L) 22  Calcium 8.9 - 10.3 mg/dL 8.1(L) 8.3(L) 8.2(L)  Total Protein 6.5 - 8.1 g/dL 5.0(L) 5.1(L) 4.6(L)  Total Bilirubin  0.3 - 1.2 mg/dL 1.2 1.3(H) 0.9  Alkaline Phos 38 - 126 U/L 173(H) 146(H) 142(H)  AST 15 - 41 U/L 35 30 20  ALT 0 - 44 U/L 62(H) 68(H) 74(H)    Lab Results  Component Value Date   WBC 7.8 05/15/2020   HGB 8.2 (L) 05/15/2020   HCT 23.9 (L) 05/15/2020   MCV 85.7 05/15/2020   PLT 92 (L) 05/15/2020   NEUTROABS 5.0 05/15/2020    CT ABDOMEN PELVIS WO CONTRAST  Result Date:  04/25/2020 CLINICAL DATA:  74 year old with recent biopsy of hepatic mass on 04/23/2020. Abdominal pain with down trending hemoglobin level. EXAM: CT ABDOMEN AND PELVIS WITHOUT CONTRAST TECHNIQUE: Multidetector CT imaging of the abdomen and pelvis was performed following the standard protocol without IV contrast. COMPARISON:  CT abdomen and pelvis 04/20/2020 FINDINGS: Lower chest: New small bilateral pleural effusions. Hepatobiliary: Again noted is a poorly defined central right hepatic lesion that measures at least 7.4 cm. This lesion was recently biopsied. There is new perihepatic fluid along the right hepatic lobe. No acute abnormality to the liver. Pancreas: There is a mass or lymphadenopathy near the pancreatic head and similar to the recent comparison examination. Concern for nodularity near the pancreatic tail region which is similar to the recent comparison examination. Spleen: Poorly defined low-density lesion in the anterior inferior aspect of the spleen and concerning for a splenic lesion. Adrenals/Urinary Tract: Small nodule associated with the right adrenal gland. Mild fullness left adrenal gland. Probable cyst in left kidney upper pole. Negative for hydronephrosis. Fluid in the urinary bladder. Stomach/Bowel: Normal appearance of stomach. No evidence for bowel obstruction focal bowel inflammation. Vascular/Lymphatic: Abdominal aorta measures up to 2.8 cm with atherosclerotic calcifications. Again noted is extensive para-aortic lymphadenopathy. Enlarged lymph nodes in the left iliac nodal chain. Again noted is a large mass or nodal mass in the central abdomen near the pancreatic head that measures up to 6.3 cm. Additional large mesenteric lymph nodes in the right abdomen on sequence 3, image 49. Probable enlarged lymph nodes in gastrohepatic ligament. Splenule or nodal lesion anterior to the spleen on image 21, sequence 3. Reproductive: Prostate is enlarged. Other: There is slightly dense ascites in  the lower abdomen and pelvis. Findings are most compatible with hemoperitoneum from recent liver biopsy. Small amount of high-density fluid in the left lateral lower quadrant. Small amount of fluid around the liver. The most dense ascites measures roughly 26 Hounsfield units. Small amount of high-density fluid or blood extending into the right inguinal canal. Negative for free air. Musculoskeletal: Severe anterolisthesis at L4-L5. Multilevel degenerative changes in the lumbar spine. IMPRESSION: 1. Development of high-density ascites in the abdomen and pelvis. Findings are most compatible with a small to moderate amount of hemoperitoneum and related to the recent liver biopsy. 2. Extensive lymphadenopathy throughout the abdomen and pelvis with a large mass or nodal mass near the pancreatic head. There are additional lesions involving the liver and spleen. Findings are suggestive for metastatic disease. 3. Development of small bilateral pleural effusions. These results were called by telephone at the time of interpretation on 04/25/2020 at 1:43 pm to provider ERIC [place] , who verbally acknowledged these results. Electronically Signed   By: Markus Daft M.D.   On: 04/25/2020 13:50   CT ABDOMEN PELVIS WO CONTRAST  Result Date: 04/20/2020 CLINICAL DATA:  74 year old male with nausea vomiting. Epigastric pain. EXAM: CT CHEST, ABDOMEN AND PELVIS WITHOUT CONTRAST TECHNIQUE: Multidetector CT imaging of the chest, abdomen and pelvis was  performed following the standard protocol without IV contrast. COMPARISON:  None. FINDINGS: Evaluation of this exam is limited in the absence of intravenous contrast. CT CHEST FINDINGS Cardiovascular: There is no cardiomegaly or pericardial effusion. Coronary vascular calcification. There is moderate atherosclerotic calcification of the thoracic aorta. The central pulmonary arteries are grossly unremarkable on this noncontrast CT. Mediastinum/Nodes: Large anterior mediastinal/prevascular  space mass/adenopathy abutting the anterior aspect of the aortic arch measuring 6.3 x 4.0 cm in greatest axial dimensions. There is superior extension of the mass into the upper mediastinum. Additional 2.7 x 2.2 cm nodular density in the region of the left hilum may represent portion of the mass or new adenopathy. The esophagus is grossly unremarkable. No mediastinal fluid collection. Lungs/Pleura: The lungs are clear. There is no pleural effusion pneumothorax. The central airways are patent. Musculoskeletal: Degenerative changes of the spine. No acute osseous pathology. CT ABDOMEN PELVIS FINDINGS No intra-abdominal free air or free fluid. Hepatobiliary: There is an 8.6 x 8.2 cm low attenuating mass in the right lobe of the liver which is not characterized on this CT. Further characterization with MRI without and with contrast on a nonemergent/outpatient basis recommended. No intrahepatic biliary ductal dilatation. Probable sludge within the gallbladder. No calcified stone or pericholecystic fluid. Pancreas: No acute inflammation. No pancreatic atrophy or dilatation of the main pancreatic duct Spleen: Indeterminate and ill-defined hypodense lesions within the spleen. There is slight irregularity of the splenic contour. Adrenals/Urinary Tract: Mild thickening of the adrenal glands. There is no hydronephrosis or nephrolithiasis on either side. A 15 mm left renal upper pole hypodense lesion, likely a cyst. The visualized ureters and urinary bladder appear unremarkable. Stomach/Bowel: There is sigmoid diverticulosis without active inflammatory changes. There is no bowel obstruction or active inflammation. The appendix is normal. Vascular/Lymphatic: Advanced aortoiliac atherosclerotic disease. The IVC is unremarkable. No portal venous gas. Extensive mesenteric and retroperitoneal adenopathy. There is a 6.3 x 6.9 cm soft tissue mass in the mesentery abutting the inferior aspect of the uncinate process of the pancreas.  Additional soft tissue mass/adenopathy medial to the ascending colon. Reproductive: Mild prostate enlargement. Other: None Musculoskeletal: Degenerative changes of the spine. 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 No acute osseous pathology. IMPRESSION: 1. Large anterior mediastinal/prevascular space mass/adenopathy. The mass extends superiorly and abuts the upper trachea. 2. Large hypodense lesion in the right lobe of the liver as well as ill-defined splenic hypodense lesions. Further characterization with MRI without and with contrast recommended. The liver lesion is amenable to percutaneous tissue sampling. 3. Extensive mesenteric and retroperitoneal adenopathy. 4. Sigmoid diverticulosis. No bowel obstruction. Normal appendix. 5. Aortic Atherosclerosis (ICD10-I70.0). Electronically Signed   By: Anner Crete M.D.   On: 04/20/2020 21:11   CT Chest Wo Contrast  Result Date: 04/20/2020 CLINICAL DATA:  74 year old male with nausea vomiting. Epigastric pain. EXAM: CT CHEST, ABDOMEN AND PELVIS WITHOUT CONTRAST TECHNIQUE: Multidetector CT imaging of the chest, abdomen and pelvis was performed following the standard protocol without IV contrast. COMPARISON:  None. FINDINGS: Evaluation of this exam is limited in the absence of intravenous contrast. CT CHEST FINDINGS Cardiovascular: There is no cardiomegaly or pericardial effusion. Coronary vascular calcification. There is moderate atherosclerotic calcification of the thoracic aorta. The central pulmonary arteries are grossly unremarkable on this noncontrast CT. Mediastinum/Nodes: Large anterior mediastinal/prevascular space mass/adenopathy abutting the anterior aspect of the aortic arch measuring 6.3 x 4.0 cm in greatest axial dimensions. There is superior extension of the mass into the upper mediastinum. Additional 2.7 x  2.2 cm nodular density in the region of the left hilum may represent portion of the mass or new adenopathy. The esophagus  is grossly unremarkable. No mediastinal fluid collection. Lungs/Pleura: The lungs are clear. There is no pleural effusion pneumothorax. The central airways are patent. Musculoskeletal: Degenerative changes of the spine. No acute osseous pathology. CT ABDOMEN PELVIS FINDINGS No intra-abdominal free air or free fluid. Hepatobiliary: There is an 8.6 x 8.2 cm low attenuating mass in the right lobe of the liver which is not characterized on this CT. Further characterization with MRI without and with contrast on a nonemergent/outpatient basis recommended. No intrahepatic biliary ductal dilatation. Probable sludge within the gallbladder. No calcified stone or pericholecystic fluid. Pancreas: No acute inflammation. No pancreatic atrophy or dilatation of the main pancreatic duct Spleen: Indeterminate and ill-defined hypodense lesions within the spleen. There is slight irregularity of the splenic contour. Adrenals/Urinary Tract: Mild thickening of the adrenal glands. There is no hydronephrosis or nephrolithiasis on either side. A 15 mm left renal upper pole hypodense lesion, likely a cyst. The visualized ureters and urinary bladder appear unremarkable. Stomach/Bowel: There is sigmoid diverticulosis without active inflammatory changes. There is no bowel obstruction or active inflammation. The appendix is normal. Vascular/Lymphatic: Advanced aortoiliac atherosclerotic disease. The IVC is unremarkable. No portal venous gas. Extensive mesenteric and retroperitoneal adenopathy. There is a 6.3 x 6.9 cm soft tissue mass in the mesentery abutting the inferior aspect of the uncinate process of the pancreas. Additional soft tissue mass/adenopathy medial to the ascending colon. Reproductive: Mild prostate enlargement. Other: None Musculoskeletal: Degenerative changes of the spine. 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 No acute osseous pathology. IMPRESSION: 1. Large anterior mediastinal/prevascular space  mass/adenopathy. The mass extends superiorly and abuts the upper trachea. 2. Large hypodense lesion in the right lobe of the liver as well as ill-defined splenic hypodense lesions. Further characterization with MRI without and with contrast recommended. The liver lesion is amenable to percutaneous tissue sampling. 3. Extensive mesenteric and retroperitoneal adenopathy. 4. Sigmoid diverticulosis. No bowel obstruction. Normal appendix. 5. Aortic Atherosclerosis (ICD10-I70.0). Electronically Signed   By: Anner Crete M.D.   On: 04/20/2020 21:11   CT ABDOMEN PELVIS W CONTRAST  Result Date: 05/06/2020 CLINICAL DATA:  Abdominal pain. Increased liver function tests. Large B-cell lymphoma. Recent liver biopsy with hemoperitoneum. EXAM: CT ABDOMEN AND PELVIS WITH CONTRAST TECHNIQUE: Multidetector CT imaging of the abdomen and pelvis was performed using the standard protocol following bolus administration of intravenous contrast. CONTRAST:  18mL OMNIPAQUE IOHEXOL 300 MG/ML  SOLN COMPARISON:  04/25/2020 FINDINGS: Lower chest: Small left and trace right pleural effusions. Descending thoracic aortic atherosclerosis. Mild mitral valve calcification. Hepatobiliary: Dominant right hepatic lobe lesion 10.8 by 10.4 cm on image 16 of series 2, formerly about 9.1 cm based on my measurement. Mixed internal density favoring internal hematoma. There is also increased prominence of a lobulation along the inferior border possibly also representing hematoma or additional lesion measuring 6.0 by 5.0 cm on image 24 series 2. Dependent density in the gallbladder favoring sludge, blood products, or hematoma. No biliary dilatation. Pancreas: Mesenteric mass mass along the inferior margin of the head of the pancreas measuring 4.5 by 3.7 cm on image 36 of series 2, roughly stable from prior. Spleen: Multiple masses in the spleen are present, little larger lesions measures 5.0 by 3.9 cm on image 19 of series 2. These were present previously  although much less conspicuous due to the lack of IV contrast. Adrenals/Urinary Tract: Small  nodule along the inferior margin of the right adrenal gland measuring 1.0 by 1.3 cm, probably an adenoma based on prior imaging but technically nonspecific on today's exam. Hypodense 2.2 by 2.4 cm lesion in the left mid upper kidney with fluid density, favoring cyst. Urinary bladder unremarkable. Stomach/Bowel: Air-levels in the distal colon favoring diarrheal process. Sigmoid colon diverticulosis. There is stranding along the sigmoid colon such that low-grade diverticulitis cannot be readily excluded. Vascular/Lymphatic: Aortoiliac atherosclerotic vascular disease. Scattered pathologic adenopathy. Adjacent to the pancreatic tail and splenic hilum, there are clustered abnormal lymph nodes with index node measuring 1.8 cm in short axis on image 23 of series 2, formerly 1.6 cm. Portacaval node 1.8 cm in short axis on image 28 series 2, formerly 2.1 cm. Left periaortic node 1.5 cm in short axis on image 36 series 2, formerly 1.7 cm. Left external iliac node 1.4 cm in short axis on image 60 of series 2, formerly the same. Left common iliac node 1.3 cm in short axis on image 51 series 2, formerly 1.8 cm. Other additional enlarged lymph nodes are present, the generally the lymph nodes are mildly reduced in size compared to the prior exam. Reproductive: Prostatomegaly Other: Complex ascites mildly reduced from the prior exam compatible with hemoperitoneum. Musculoskeletal: Bridging spurring of the sacroiliac joints. 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 Mesenteric and subcutaneous edema compatible with third spacing of fluid. IMPRESSION: 1. Mild improvement in the adenopathy in the abdomen and pelvis. 2. Mild reduction in size of the complex ascites compatible with hemoperitoneum. 3. Large  bilobed mass in the liver with internal heterogeneous high density probably from internal hematoma, difficult to compare in size to the prior exam due to low conspicuity on the prior exam. 4. Stable appearance of the mesenteric mass along the inferior margin of the head of the pancreas. 5. Multiple splenic masses, some of which are similar to prior, difficult to compare in size due to low conspicuity on prior noncontrast exam. 6. Air-levels in the distal colon favoring diarrheal process. 7. Sigmoid colon diverticulosis. There is stranding along the sigmoid colon such that low-grade diverticulitis cannot be readily excluded. 8. Small left and trace right pleural effusions. 9. Prostatomegaly. 10. Multilevel lumbar impingement. 11. Small right adrenal nodule, probably an adenoma based on prior imaging but technically nonspecific on today's exam. 12. 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 13. Mesenteric and subcutaneous edema compatible with third spacing of fluid. 14. Aortic atherosclerosis. Aortic Atherosclerosis (ICD10-I70.0). Electronically Signed   By: Van Clines M.D.   On: 05/06/2020 13:56   US RENAL  Result Date: 04/22/2020 CLINICAL DATA:  Acute renal failure. EXAM: RENAL / URINARY TRACT ULTRASOUND COMPLETE COMPARISON:  CT 04/20/2020 FINDINGS: Right Kidney: Renal measurements: 12.7 x 5.4 x 5.5 cm = volume: 195 mL. Echogenicity within normal limits. No mass or hydronephrosis visualized. Left Kidney: Renal measurements: 12.9 x 5.9 x 5.0 cm = volume: 198 mL. Echogenicity within normal limits. No hydronephrosis visualized. Benign-appearing cyst measures 2.4 cm in the upper pole of the left kidney. Bladder: Appears normal for degree of bladder distention. Prevoid volume is 71 cc. Other: None. IMPRESSION: 1. 2.4 cm benign-appearing left renal cyst. 2. Otherwise normal  appearance of the kidneys and bladder. Electronically Signed   By: Linwood Dibbles.D.  On: 04/22/2020 10:30   US BIOPSY (LIVER)  Result Date: 04/23/2020 INDICATION: 74 year old male with indeterminate right lobe up attic mass. EXAM: ULTRASOUND GUIDED LIVER LESION BIOPSY COMPARISON:  None. MEDICATIONS: None ANESTHESIA/SEDATION: Fentanyl 75 mcg IV; Versed 1.5 mg IV Total Moderate Sedation time:  13 minutes. The patient's level of consciousness and vital signs were monitored continuously by radiology nursing throughout the procedure under my direct supervision. COMPLICATIONS: None immediate. PROCEDURE: Informed written consent was obtained from the patient after a discussion of the risks, benefits and alternatives to treatment. The patient understands and consents the procedure. A timeout was performed prior to the initiation of the procedure. Ultrasound scanning was performed of the right upper abdominal quadrant demonstrates heterogeneously hypoechoic well-defined mass in the right lobe measuring up to approximately 9 cm. The right lobe mass was selected for biopsy and the procedure was planned. The right upper abdominal quadrant was prepped and draped in the usual sterile fashion. The overlying soft tissues were anesthetized with 1% lidocaine with epinephrine. A 17 gauge, 6.8 cm co-axial needle was advanced into a peripheral aspect of the lesion. This was followed by 3 core biopsies with an 18 gauge core device under direct ultrasound guidance. The coaxial needle tract was embolized with a small amount of Gel-Foam slurry and superficial hemostasis was obtained with manual compression. Post procedural scanning was negative for definitive area of hemorrhage or additional complication. A dressing was placed. The patient tolerated the procedure well without immediate post procedural complication. IMPRESSION: Technically successful ultrasound guided core needle biopsy of right lobe hepatic mass. Ruthann Cancer,  MD [facility] Radiology Electronically Signed   By: Ruthann Cancer MD   On: 04/23/2020 17:40   DG Chest Port 1 View  Result Date: 05/12/2020 CLINICAL DATA:  Sepsis EXAM: PORTABLE CHEST 1 VIEW COMPARISON:  April 20, 2020 FINDINGS: There is a subtle right infrahilar airspace opacity. No pneumothorax. No large pleural effusion. The heart size is stable. Aortic calcifications are noted. The previously demonstrated adenopathy in the upper mediastinum is improved. IMPRESSION: 1. Subtle right infrahilar airspace opacity may represent atelectasis or infiltrate. 2. Improved mediastinal adenopathy. 3. No pneumothorax or large pleural effusion. Electronically Signed   By: Constance Holster M.D.   On: 05/12/2020 21:16   DG Chest Port 1 View  Result Date: 04/20/2020 CLINICAL DATA:  Shortness of breath.  Constipation.  Weakness. EXAM: PORTABLE CHEST 1 VIEW COMPARISON:  06/09/2018 FINDINGS: Altered mediastinal contour compared to the prior exam, I cannot exclude aortic aneurysm, mediastinal mass, or hematoma. CT of the chest (with contrast if feasible) is recommended for further workup. The lungs appear clear.  Thoracic spondylosis noted. IMPRESSION: 1. Abnormal mediastinal contour, substantially changed from 06/09/2018. I cannot exclude aortic aneurysm, mediastinal mass, or hematoma. CT of the chest (with contrast if feasible) is recommended for further workup. 2. Thoracic spondylosis. Electronically Signed   By: Van Clines M.D.   On: 04/20/2020 18:48   DG Abd Portable 1V  Result Date: 05/03/2020 CLINICAL DATA:  Abdominal distension. EXAM: PORTABLE ABDOMEN - 1 VIEW COMPARISON:  CT AP from 04/25/2020 FINDINGS: The bowel gas pattern is normal. No radio-opaque calculi or other significant radiographic abnormality are seen. Lumbar spondylosis identified. IMPRESSION: Nonobstructive bowel gas pattern. Electronically Signed   By: Kerby Moors M.D.   On:  05/03/2020 08:41   ECHOCARDIOGRAM COMPLETE  Result Date: 04/30/2020    ECHOCARDIOGRAM REPORT   Patient Name:   GARED GILLIE Date of Exam: 04/30/2020 Medical  Rec #:  [id number]       Height:       71.0 in Accession #:    [id number]      Weight:       222.7 lb Date of Birth:  1946/05/12       BSA:          2.207 m Patient Age:    18 years        BP:           165/78 mmHg Patient Gender: M               HR:           85 bpm. Exam Location:  Inpatient Procedure: 2D Echo, Color Doppler, Cardiac Doppler, Strain Analysis and 3D Echo Indications:    Pre-Chemo Evaluation v67.2  History:        Patient has prior history of Echocardiogram examinations, most                 recent 06/23/2018. Risk Factors:Hypertension.  Sonographer:    Raquel Sarna Senior RDCS Referring Phys: [id number] [place]  1. Left ventricular ejection fraction, by estimation, is 60 to 65%. The left ventricle has normal function. The left ventricle has no regional wall motion abnormalities. There is moderate left ventricular hypertrophy of the basal-septal segment. Left ventricular diastolic parameters are consistent with Grade I diastolic dysfunction (impaired relaxation). Elevated left ventricular end-diastolic pressure. The average left ventricular global longitudinal strain is -18.4 %. The global longitudinal strain  is normal.  2. Right ventricular systolic function is normal. The right ventricular size is normal. There is normal pulmonary artery systolic pressure. The estimated right ventricular systolic pressure is 79.8 mmHg.  3. Left atrial size was severely dilated.  4. The mitral valve is normal in structure. Trivial mitral valve regurgitation. No evidence of mitral stenosis.  5. The aortic valve is normal in structure. Aortic valve regurgitation is not visualized. No aortic stenosis is present.  6. Aortic dilatation noted. There is borderline dilatation of the aortic root, measuring 37 mm.  7. The inferior vena cava is dilated in size  with >50% respiratory variability, suggesting right atrial pressure of 8 mmHg. FINDINGS  Left Ventricle: Left ventricular ejection fraction, by estimation, is 60 to 65%. The left ventricle has normal function. The left ventricle has no regional wall motion abnormalities. The average left ventricular global longitudinal strain is -18.4 %. The global longitudinal strain is normal. The left ventricular internal cavity size was normal in size. There is moderate left ventricular hypertrophy of the basal-septal segment. Left ventricular diastolic parameters are consistent with Grade I diastolic dysfunction (impaired relaxation). Elevated left ventricular end-diastolic pressure. Right Ventricle: The right ventricular size is normal. No increase in right ventricular wall thickness. Right ventricular systolic function is normal. There is normal pulmonary artery systolic pressure. The tricuspid regurgitant velocity is 2.62 m/s, and  with an assumed right atrial pressure of 8 mmHg, the estimated right ventricular systolic pressure is 92.1 mmHg. Left Atrium: Left atrial size was severely dilated. Right Atrium: Right atrial size was normal in size. Pericardium: There is no evidence of pericardial effusion. Mitral Valve: The mitral valve is normal in structure. There is mild calcification of the anterior mitral valve leaflet(s). Mild mitral annular calcification. Trivial mitral valve regurgitation. No evidence of mitral valve stenosis. Tricuspid Valve: The tricuspid valve is normal in structure. Tricuspid valve regurgitation is trivial. No evidence of tricuspid stenosis. Aortic Valve: The aortic valve is  normal in structure. Aortic valve regurgitation is not visualized. No aortic stenosis is present. Pulmonic Valve: The pulmonic valve was normal in structure. Pulmonic valve regurgitation is not visualized. No evidence of pulmonic stenosis. Aorta: Aortic dilatation noted. There is borderline dilatation of the aortic root,  measuring 37 mm. Venous: The inferior vena cava is dilated in size with greater than 50% respiratory variability, suggesting right atrial pressure of 8 mmHg. IAS/Shunts: The interatrial septum appears to be lipomatous. No atrial level shunt detected by color flow Doppler.  LEFT VENTRICLE PLAX 2D LVIDd:         3.90 cm  Diastology LVIDs:         2.60 cm  LV e' medial:    7.40 cm/s LV PW:         1.13 cm  LV E/e' medial:  13.2 LV IVS:        1.60 cm  LV e' lateral:   5.11 cm/s LVOT diam:     1.90 cm  LV E/e' lateral: 19.1 LV SV:         80 LV SV Index:   36       2D Longitudinal Strain LVOT Area:     2.84 cm 2D Strain GLS (A2C):   -17.7 %                         2D Strain GLS (A3C):   -19.8 %                         2D Strain GLS (A4C):   -17.6 %                         2D Strain GLS Avg:     -18.4 %                          3D Volume EF:                         3D EF:        59 %                         LV EDV:       204 ml                         LV ESV:       83 ml                         LV SV:        121 ml RIGHT VENTRICLE RV S prime:     15.80 cm/s TAPSE (M-mode): 2.6 cm LEFT ATRIUM              Index       RIGHT ATRIUM           Index LA diam:        4.40 cm  1.99 cm/m  RA Area:     19.70 cm LA Vol (A2C):   124.0 ml 56.19 ml/m RA Volume:   53.90 ml  24.42 ml/m LA Vol (A4C):   92.1 ml  41.73 ml/m LA Biplane Vol: 108.0 ml 48.94 ml/m  AORTIC VALVE LVOT Vmax:   134.00 cm/s  LVOT Vmean:  85.400 cm/s LVOT VTI:    0.282 m  AORTA Ao Root diam: 3.70 cm Ao Asc diam:  3.30 cm MITRAL VALVE                TRICUSPID VALVE MV Area (PHT): 2.69 cm     TR Peak grad:   27.5 mmHg MV Decel Time: 282 msec     TR Vmax:        262.00 cm/s MV E velocity: 97.70 cm/s MV A velocity: 113.00 cm/s  SHUNTS MV E/A ratio:  0.86         Systemic VTI:  0.28 m                             Systemic Diam: 1.90 cm Fransico Him MD Electronically signed by Fransico Him MD Signature Date/Time: 04/30/2020/2:29:48 PM    Final    IR PICC PLACEMENT  RIGHT >5 YRS INC IMG GUIDE  Result Date: 05/01/2020 INDICATION: 74 year old male in need of a dual lumen PICC for chemotherapy. EXAM: PICC LINE PLACEMENT WITH ULTRASOUND AND FLUOROSCOPIC GUIDANCE MEDICATIONS: None. ANESTHESIA/SEDATION: None. FLUOROSCOPY TIME:  Fluoroscopy Time: 0 minutes 6 seconds (3 mGy). COMPLICATIONS: None immediate. PROCEDURE: The patient was advised of the possible risks and complications and agreed to undergo the procedure. The patient was then brought to the angiographic suite for the procedure. The right arm was prepped with chlorhexidine, draped in the usual sterile fashion using maximum barrier technique (cap and mask, sterile gown, sterile gloves, large sterile sheet, hand hygiene and cutaneous antisepsis) and infiltrated locally with 1% Lidocaine. Ultrasound demonstrated patency of the right basilic vein, and this was documented with an image. Under real-time ultrasound guidance, this vein was accessed with a 21 gauge micropuncture needle and image documentation was performed. A 0.018 wire was introduced in to the vein. Over this, a 6 [place] dual lumen power-injectable PICC was advanced to the lower SVC/right atrial junction. Fluoroscopy during the procedure and fluoro spot radiograph confirms appropriate catheter position. The catheter was flushed and covered with a sterile dressing. Catheter length: 40 cm IMPRESSION: Successful right arm Power PICC line placement with ultrasound and fluoroscopic guidance. The catheter is ready for use. Electronically Signed   By: Jacqulynn Cadet M.D.   On: 05/01/2020 15:56    ASSESSMENT AND PLAN: 1.  Diffuse large B-cell lymphoma, ABC subtype, stage IV 2.  Pancytopenia secondary to chemotherapy, improved 3.  Sepsis, resolved 4.  Febrile neutropenia, resolved 5.  AKI, resolved 6.  Mucositis secondary to recent chemotherapy  -Labs reviewed.  His white blood cell count has now recovered.  Hemoglobin stable.  Platelet count improving is up  to 92,000 today.  No transfusion support recommended. -Patient remains afebrile and cultures remain negative.  Hospitalist is de-escalating antibiotics today. -He has mucositis secondary to recent chemotherapy.  Continue Magic mouthwash with lidocaine and sodium bicarbonate mouth rinses.  HSV culture has been sent.  Will follow-up on this once it results. -Port-A-Cath will not be placed today per IR.  We will plan to place this the morning of admission or his second cycle of chemotherapy.  From our standpoint, the patient may discharged home today if otherwise medically stable.  PET scan has been canceled.  I have also called radiology and canceled the LP scheduled for this Friday.  He has a follow-up visit already scheduled in our office on 05/18/2020.  He was advised to keep this appointment.  He knows to call our office in the interim if he has any questions or concerns.   LOS: 3 days   Mikey Bussing, DNP, AGPCNP-BC, AOCNP 05/15/20

## 2020-05-15 NOTE — Progress Notes (Signed)
Oncology brief note  I spoke with IR Dr. Annamaria Boots about his port placement, he recommends to postpone it, it's rescheduled to 8/20am am before his hospital admission for cycle 2 chemo.   I cancelled his NPO, he can eat now.   John [name]  05/15/2020 9:15 AM

## 2020-05-15 NOTE — Discharge Summary (Signed)
Physician Discharge Summary  John [name]:[id number] DOB: 1945-10-13 DOA: 05/12/2020  PCP: Christain Sacramento, MD  Admit date: 05/12/2020 Discharge date: 05/15/2020  Admitted From: Home  Discharge disposition: Home  Recommendations for Outpatient Follow-Up:   . Follow up with your primary care provider in one week.  . Check CBC, BMP, magnesium in the next visit . Follow-up with oncology as has been scheduled.  Discharge Diagnosis:   Principal Problem:   Febrile neutropenia (HCC) Active Problems:   Diffuse large B cell lymphoma (HCC)   Sepsis (HCC)   Antineoplastic chemotherapy induced pancytopenia (HCC)   Mucositis   Discharge Condition: Improved.  Diet recommendation: Low sodium, heart healthy.    Wound care: None.  Code status: Full.   History of Present Illness:   John Hubers Footeis a 74 y.o.malewith medical history significant ofDLBCL diagnosed during hospitalization for AKI and hypercalcemia early November, underwent induction chemotherapy in a second hospital stay just this past week. Pt currently on cipro/flagyl for possible diverticulitis seen on CT scan 11/28. Pt discharged from hospital on 11/29. Pt has pancytopenia from the chemotherapy, just had PRBC and platelet transfusion prior to admission.  Pt presented to ED with generalized weakness, fever, and ongoing diarrhea.  In the ED, WBC was less than 0.1.  Platelet of 23.  Hemoglobin of 8.1.  Chest x-ray showed atelectasis versus infiltrate.  Lactate was elevated at 3.8.  Patient was given cefepime and vancomycin, IV fluids and then was admitted to hospital for febrile neutropenia/sepsis secondary to pneumonia.   Hospital Course:   Following conditions were addressed during hospitalization as listed below,  Sepsis and febrile neutropenia likely secondary to right infrahilar pneumonia/mucositis. Patient presented with features of sepsis including tachycardia, tachypnea significant leukopenia on  presentation with elevated lactic at 3.8.  Patient does have questionable pneumonia on the chest x-ray.  Due to immune compromised status and febrile neutropenia/ possible pneumonia, patient was on cefepime, Flagyl and vancomycin. Patient received a 3.5 L of IV fluid bolus initially.  Blood cultures were negative until the time of discharge.  Patient was afebrile. Urine culture negative.  Lactate has improved to 1.2.   MRSA PCR negative.  Procalcitonin initially elevated at 2.4.  WBC count has improved to 7.8. Temperature max of 99.1 F.  Will de-escalate antibiotic to ciprofloxacin for next 5 days to complete the course on discharge.  Mucositis, oral aphthous ulceration, continue Magic mouthwash, sodium bicarb rinse.  Seen by oncology. Hospitalization.  Mild hypophosphatemia.  Replenished prior to discharge.  Will need labs in the next follow-up with  Pancytopenia -recently got platelet and PRBC transfusion as outpatient.  Latest hemoglobin of 8.2 with platelet of 92.  Improving. Patient received Granix x1 on admission.    DLBCL -S/P induction chemotherapy.  Currently pancytopenic improving.  Will need to follow-up with oncology as has been scheduled.  Renal insufficiency likely mild AKI due to volume depletion. -improved with IV fluids.  Creatinine 1.0 today.  Mild hypokalemia.   Patient received 40 mEq of p.o. potassium today prior to discharge.  Latest potassium of 3.3.  Will need labs with the next follow-up.  Mild hyponatremia .  Borderline.  Improved.  Hypomagnesemia.  Improved with replacement.   Disposition.  At this time, patient is stable for disposition home.  Has been seen by physical therapy and patient did not require any skilled therapy needs.  Spoke with the patient's wife at bedside  Medical Consultants:    Oncology  Procedures:  None Subjective:   Today, patient was seen and examined at bedside.  Wishes to go home.  Could not sleep well yesterday.  Oral  pain much better.  Denies any shortness of breath, cough, fever.  Patient's wife at bedside.  Discharge Exam:   Vitals:   05/15/20 0419 05/15/20 1322  BP: (!) 149/73 (!) 156/76  Pulse: 96 (!) 106  Resp: 18 16  Temp: 98.1 F (36.7 C) 98.5 F (36.9 C)  SpO2: 95% 97%   Vitals:   05/14/20 1702 05/14/20 2146 05/15/20 0419 05/15/20 1322  BP: (!) 142/76 128/62 (!) 149/73 (!) 156/76  Pulse: (!) 103 96 96 (!) 106  Resp: 18 18 18 16   Temp: 98.4 F (36.9 C) 99.1 F (37.3 C) 98.1 F (36.7 C) 98.5 F (36.9 C)  TempSrc: Oral Oral Oral Oral  SpO2: 97% 95% 95% 97%  Weight:      Height:       General:  Average built, not in obvious distress, mildly anxious, oriented, communicative HENT:   No scleral pallor or icterus noted. Oral mucosa -aphthous ulceration, erythematous rash over the palate. Chest:  Clear breath sounds.  Diminished breath sounds bilaterally. No crackles or wheezes.  CVS: S1 &S2 heard. No murmur.  Regular rate and rhythm. Abdomen: Soft, nontender, nondistended.  Bowel sounds are heard.   Extremities: No cyanosis, clubbing or edema.  Peripheral pulses are palpable. Psych: Alert, awake and oriented, normal mood CNS:  No cranial nerve deficits.  Power equal in all extremities.   Skin: Warm and dry.  Oral mucosa with ulcerations  The results of significant diagnostics from this hospitalization (including imaging, microbiology, ancillary and laboratory) are listed below for reference.     Diagnostic Studies:   DG Chest Port 1 View  Result Date: 05/12/2020 CLINICAL DATA:  Sepsis EXAM: PORTABLE CHEST 1 VIEW COMPARISON:  April 20, 2020 FINDINGS: There is a subtle right infrahilar airspace opacity. No pneumothorax. No large pleural effusion. The heart size is stable. Aortic calcifications are noted. The previously demonstrated adenopathy in the upper mediastinum is improved. IMPRESSION: 1. Subtle right infrahilar airspace opacity may represent atelectasis or infiltrate. 2.  Improved mediastinal adenopathy. 3. No pneumothorax or large pleural effusion. Electronically Signed   By: Constance Holster M.D.   On: 05/12/2020 21:16     Labs:   Basic Metabolic Panel: Recent Labs  Lab 05/10/20 0956 05/10/20 0956 05/12/20 2044 05/12/20 2044 05/12/20 2327 05/13/20 0411 05/13/20 0411 05/14/20 0549 05/15/20 0646  NA 134*  --  128*  --   --  130*  --  132* 133*  K 3.9   < > 3.4*   < >  --  3.3*   < > 3.4* 3.3*  CL 102  --  96*  --   --  99  --  100 101  CO2 24  --  18*  --   --  22  --  21* 21*  GLUCOSE 144*  --  179*  --   --  120*  --  94 94  BUN 32*  --  24*  --   --  22  --  17 14  CREATININE 1.30*  --  1.38*  --   --  1.10  --  1.14 1.08  CALCIUM 8.7*  --  8.7*  --   --  8.2*  --  8.3* 8.1*  MG  --   --   --   --   --   --   --  1.6* 1.8  PHOS  --   --   --   --  2.0*  --   --  2.5 2.2*   < > = values in this interval not displayed.   GFR Estimated Creatinine Clearance: 74.7 mL/min (by C-G formula based on SCr of 1.08 mg/dL). Liver Function Tests: Recent Labs  Lab 05/10/20 0956 05/12/20 2044 05/13/20 0411 05/14/20 0549 05/15/20 0646  AST 27 29 20 30  35  ALT 208* 90* 74* 68* 62*  ALKPHOS 178* 173* 142* 146* 173*  BILITOT 1.3* 1.3* 0.9 1.3* 1.2  PROT 4.9* 5.2* 4.6* 5.1* 5.0*  ALBUMIN 2.7* 2.9* 2.5* 2.8* 2.7*   No results for input(s): LIPASE, AMYLASE in the last 168 hours. No results for input(s): AMMONIA in the last 168 hours. Coagulation profile Recent Labs  Lab 05/12/20 2044 05/13/20 0411  INR 1.6* 1.7*    CBC: Recent Labs  Lab 05/10/20 0956 05/12/20 2044 05/13/20 0411 05/14/20 0721 05/15/20 0646  WBC 0.6* <0.1* 0.1* 1.6* 7.8  NEUTROABS 0.5*  --   --  1.2* 5.0  HGB 7.4* 8.1* 7.2* 8.1* 8.2*  HCT 22.1* 23.3* 20.3* 23.2* 23.9*  MCV 86.7 85.3 84.9 85.6 85.7  PLT 22* 23* 22* 60* 92*   Cardiac Enzymes: No results for input(s): CKTOTAL, CKMB, CKMBINDEX, TROPONINI in the last 168 hours. BNP: Invalid input(s): POCBNP CBG: No  results for input(s): GLUCAP in the last 168 hours. D-Dimer No results for input(s): DDIMER in the last 72 hours. Hgb A1c No results for input(s): HGBA1C in the last 72 hours. Lipid Profile No results for input(s): CHOL, HDL, LDLCALC, TRIG, CHOLHDL, LDLDIRECT in the last 72 hours. Thyroid function studies No results for input(s): TSH, T4TOTAL, T3FREE, THYROIDAB in the last 72 hours.  Invalid input(s): FREET3 Anemia work up No results for input(s): VITAMINB12, FOLATE, FERRITIN, TIBC, IRON, RETICCTPCT in the last 72 hours. Microbiology Recent Results (from the past 240 hour(s))  Blood Culture (routine x 2)     Status: None (Preliminary result)   Collection Time: 05/12/20  8:44 PM   Specimen: BLOOD  Result Value Ref Range Status   Specimen Description   Final    BLOOD RIGHT ANTECUBITAL Performed at [facility], [place] [address], [place], [place] [address]    Special Requests   Final    BOTTLES DRAWN AEROBIC AND ANAEROBIC Blood Culture adequate volume Performed at [facility] [address] [place], [place] [address]    Culture   Final    NO GROWTH 2 DAYS Performed at [facility] [address] [place], [place] [address]    Report Status PENDING  Incomplete  Urine culture     Status: None   Collection Time: 05/12/20  8:44 PM   Specimen: Urine, Catheterized  Result Value Ref Range Status   Specimen Description   Final    URINE, CATHETERIZED Performed at [facility] [address], [place], [place] [address]    Special Requests   Final    NONE Performed at [facility], [place] [address] [place], [place] [address]    Culture   Final    NO GROWTH Performed at [facility], [place] [address], [place], [place] [address]    Report Status 05/14/2020 FINAL  Final  Blood Culture (routine x 2)     Status: None (Preliminary result)   Collection Time: 05/12/20  8:49 PM   Specimen: BLOOD  Result Value Ref  Range Status   Specimen Description   Final    BLOOD LEFT ANTECUBITAL Performed at [facility], [place] [address], [place], [place] [address]    Special Requests   Final    BOTTLES DRAWN AEROBIC AND ANAEROBIC Blood Culture results may not be optimal due to an excessive volume of blood received in culture bottles Performed at [facility] [address] [place], [place] [address]    Culture   Final    NO GROWTH 2 DAYS Performed at [facility] [address] [place], [place] [address]    Report Status PENDING  Incomplete  Resp Panel by RT-PCR (Flu A&B, Covid) Nasopharyngeal Swab     Status: None   Collection Time: 05/12/20 11:32 PM   Specimen: Nasopharyngeal Swab; Nasopharyngeal(NP) swabs in vial transport medium  Result Value Ref Range Status   SARS Coronavirus 2 by RT PCR NEGATIVE NEGATIVE Final    Comment: (NOTE) SARS-CoV-2 target nucleic acids are NOT DETECTED.  The SARS-CoV-2 RNA is generally detectable in upper respiratory specimens during the acute phase of infection. The lowest concentration of SARS-CoV-2 viral copies this assay can detect is 138 copies/mL. A negative result does not preclude SARS-Cov-2 infection and should not be used as the sole basis for treatment or other patient management decisions. A negative result may occur with  improper specimen collection/handling, submission of specimen other than nasopharyngeal swab, presence of viral mutation(s) within the areas targeted by this assay, and inadequate number of viral copies(<138 copies/mL). A negative result must be combined with clinical observations, patient history, and epidemiological information. The expected result is Negative.  Fact Sheet for Patients:  EntrepreneurPulse.com.au  Fact Sheet for Healthcare Providers:  IncredibleEmployment.be  This test is no t yet approved or cleared by the [place] FDA and  has been authorized  for detection and/or diagnosis of SARS-CoV-2 by FDA under an Emergency Use Authorization (EUA). This EUA will remain  in effect (meaning this test can be used) for the duration of the COVID-19 declaration under Section 564(b)(1) of the Act, 21 U.S.C.section 360bbb-3(b)(1), unless the authorization is terminated  or revoked sooner.       Influenza A by PCR NEGATIVE NEGATIVE Final   Influenza B by PCR NEGATIVE NEGATIVE Final    Comment: (NOTE) The Xpert Xpress SARS-CoV-2/FLU/RSV plus assay is intended as an aid in the diagnosis of influenza from Nasopharyngeal swab specimens and should not be used as a sole basis for treatment. Nasal washings and aspirates are unacceptable for Xpert Xpress SARS-CoV-2/FLU/RSV testing.  Fact Sheet for Patients: EntrepreneurPulse.com.au  Fact Sheet for Healthcare Providers: IncredibleEmployment.be  This test is not yet approved or cleared by the [place] FDA and has been authorized for detection and/or diagnosis of SARS-CoV-2 by FDA under an Emergency Use Authorization (EUA). This EUA will remain in effect (meaning this test can be used) for the duration of the COVID-19 declaration under Section 564(b)(1) of the Act, 21 U.S.C. section 360bbb-3(b)(1), unless the authorization is terminated or revoked.  Performed at [facility], [place] [address], [place], [place] [address]   MRSA PCR Screening     Status: None   Collection Time: 05/13/20 10:24 AM   Specimen: Nasopharyngeal  Result Value Ref Range Status   MRSA by PCR NEGATIVE NEGATIVE Final    Comment:        The GeneXpert MRSA Assay (FDA approved for NASAL specimens only), is one component of a comprehensive MRSA colonization surveillance  program. It is not intended to diagnose MRSA infection nor to guide or monitor treatment for MRSA infections. Performed at [facility], [place] [address], [place], [place]  [address]      Discharge Instructions:   Discharge Instructions    Call MD for:  persistant nausea and vomiting   Complete by: As directed    Call MD for:  severe uncontrolled pain   Complete by: As directed    Call MD for:  temperature >100.4   Complete by: As directed    Diet general   Complete by: As directed    Soft diet   Discharge instructions   Complete by: As directed    Follow-up with your primary care physician in 1 week.  Check blood work at that time.  Follow-up with your oncologist as scheduled by the clinic on 05/18/2020   Increase activity slowly   Complete by: As directed      Allergies as of 05/15/2020   No Known Allergies     Medication List    STOP taking these medications   lactulose 10 GM/15ML solution Commonly known as: CHRONULAC   magnesium hydroxide 400 MG/5ML suspension Commonly known as: MILK OF MAGNESIA   metroNIDAZOLE 500 MG tablet Commonly known as: FLAGYL     TAKE these medications   allopurinol 100 MG tablet Commonly known as: Zyloprim Take 1 tablet (100 mg total) by mouth daily.   aspirin EC 81 MG tablet Take 81 mg by mouth daily.   BOOST MAX PROTEIN PO Take 237 mLs by mouth in the morning and at bedtime. Chocolate What changed: Another medication with the same name was removed. Continue taking this medication, and follow the directions you see here.   ciprofloxacin 500 MG tablet Commonly known as: CIPRO Take 1 tablet (500 mg total) by mouth 2 (two) times daily for 5 days.   clobetasol ointment 0.05 % Commonly known as: TEMOVATE Apply 1 application topically daily as needed (eczema).   diphenhydrAMINE 25 mg capsule Commonly known as: BENADRYL Take 1 capsule (25 mg total) by mouth at bedtime as needed for sleep.   docusate sodium 100 MG capsule Commonly known as: COLACE Take 200 mg by mouth 2 (two) times daily as needed for mild constipation.   doxazosin 4 MG tablet Commonly known as: CARDURA Take 4 mg by mouth daily.    HYDROcodone-acetaminophen 5-325 MG tablet Commonly known as: NORCO/VICODIN Take 1 tablet by mouth every 6 (six) hours as needed for moderate pain. What changed: how much to take   magic mouthwash w/lidocaine Soln Take 15 mLs by mouth 4 (four) times daily as needed for mouth pain.   melatonin 3 MG Tabs tablet Take 2 tablets (6 mg total) by mouth at bedtime as needed (sleep).   menthol-cetylpyridinium 3 MG lozenge Commonly known as: CEPACOL Take 1 lozenge (3 mg total) by mouth as needed for sore throat.   metoprolol tartrate 25 MG tablet Commonly known as: LOPRESSOR Take 0.5 tablets (12.5 mg total) by mouth 2 (two) times daily.   MULTIVITAMIN PO Take 1 tablet by mouth daily.   OMEGA 3 PO Take 1 tablet by mouth daily.   ondansetron 8 MG tablet Commonly known as: ZOFRAN Take 1 tablet (8 mg total) by mouth every 8 (eight) hours as needed for nausea or vomiting.   pantoprazole 40 MG tablet Commonly known as: Protonix Take 1 tablet (40 mg total) by mouth daily.   polyethylene glycol powder 17 GM/SCOOP powder Commonly known  asDesma Maxim Take 17 g by mouth daily as needed for mild constipation.   prochlorperazine 10 MG tablet Commonly known as: COMPAZINE Take 1 tablet (10 mg total) by mouth every 6 (six) hours as needed for nausea or vomiting.   senna-docusate 8.6-50 MG tablet Commonly known as: Senokot-S Take 2 tablets by mouth 2 (two) times daily as needed for mild constipation.   simethicone 80 MG chewable tablet Commonly known as: MYLICON Chew 1 tablet (80 mg total) by mouth 4 (four) times daily as needed for flatulence.   sodium bicarbonate/sodium chloride Soln 1 application by Mouth Rinse route as needed for dry mouth or mouth pain.   triamcinolone 0.1 % Commonly known as: KENALOG Apply 1 application topically 3 (three) times daily as needed for rash.   vitamin C with rose hips 1000 MG tablet Take 1,000 mg by mouth daily.       Follow-up Information     Christain Sacramento, MD. Schedule an appointment as soon as possible for a visit in 1 week(s).   Specialty: Family Medicine Why: for regular followup, blood work Contact information: [address] [place] [address] [phone]                Time coordinating discharge: 39 minutes  Signed:  Harvard Zeiss  [facility] 05/15/2020, 2:24 PM

## 2020-05-16 NOTE — Progress Notes (Addendum)
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 Large anterior mediastinal/prevascular space mass/adenopathy. The mass extends superiorly and abuts the upper trachea. 2. Large hypodense lesion in the right lobe of the liver as well as ill-defined splenic hypodense lesions. Further characterization with MRI without and with contrast recommended. The liver lesion is amenable to percutaneous tissue sampling. 3. Extensive mesenteric and retroperitoneal adenopathy. 4. Sigmoid diverticulosis. No bowel obstruction. Normal appendix. 5. Aortic Atherosclerosis (ICD10-I70.0).   04/23/2020 Cancer Staging   Staging form: Hodgkin and Non-Hodgkin Lymphoma, AJCC 8th Edition - Clinical stage from 04/23/2020: Stage IV (Diffuse large B-cell lymphoma) - Signed by Truitt Merle, MD on 04/29/2020   04/23/2020 Initial Biopsy   FINAL MICROSCOPIC DIAGNOSIS:   A. LIVER, RIGHT MASS, NEEDLE CORE BIOPSY:  - Large B-cell lymphoma  - See comment     COMMENT:   The sections show needle core biopsy fragments displaying effacement of  the architecture by a dense infiltrate of primarily large atypical  lymphoid appearing cells displaying vesicular chromatin and small  nucleoli.  This is associated with apoptosis and brisk mitosis.  The  appearance is diffuse with lack of atypical follicles.  A large battery  of  immunohistochemical stains was performed and shows that the atypical  lymphoid cells are positive for LCA, CD20, PAX 5, BCL-2, BCL6, CD5,  MUM-1 (partial).  The atypical lymphoid cells are negative for CD10,  CD30, CD34, CD138, cyclin D1, TdT, and EBV in situ hybridization,  synaptophysin, cytokeratin AE1/AE3, cytokeratin 20, cytokeratin 7,  cytokeratin 5/6, TTF-1, CD56, CDX2, and p40.  There is an admixed  relatively minor population of T-cells as primarily seen with CD3.  The  overall features are consistent with large B-cell lymphoma, ABC subtype.  The lymphomatous process displays expression of CD5.  This phenotype is  seen in 5-10% of mostly de novo large B-cell lymphoma cases or rarely as  a transformation of a low-grade B-cell lymphoma such as small  lymphocytic lymphoma.  Clinical correlation is recommended.    04/25/2020 Imaging   CT AP  IMPRESSION: 1. Development of high-density ascites in the abdomen and pelvis. Findings are most compatible with a small to moderate amount of hemoperitoneum and related to the recent liver biopsy. 2. Extensive lymphadenopathy throughout the abdomen and pelvis with a large mass or nodal mass near the pancreatic head. There are additional lesions involving the liver and spleen. Findings are suggestive for metastatic disease. 3. Development of small bilateral pleural effusions.   These results were called by telephone at the time of interpretation on 04/25/2020 at 1:43 pm to provider ERIC [place] who verbally acknowledged these results.   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 He presents to  the clinic with his wife. He left hospital 3 days ago. He notes he is eating better yesterday but struggles to get in more food and drinks. He still feels weaker but able to walk in today without breaks. He notes his mouth sores much improved. He notes his LE edema has not improved yet. He and his wife deny him having any confusion. He will feel lethargic and need a nap as needed.     REVIEW OF SYSTEMS:   Constitutional: Denies fevers, chills (+) Low appetite, weight loss (+) Fatigue  Eyes: Denies blurriness of vision Ears, nose, mouth, throat, and face: Denies mucositis or sore throat (+) improved mouth sores  Respiratory: Denies cough, dyspnea or wheezes Cardiovascular: Denies palpitation, chest discomfort (+)b/l  lower extremity swelling Gastrointestinal:  Denies nausea, heartburn or change in bowel habits Skin: Denies abnormal skin rashes Lymphatics: Denies new lymphadenopathy or easy bruising Neurological:Denies numbness, tingling or new weaknesses Behavioral/Psych: Mood is stable, no new changes  All other systems were reviewed with the patient and are negative.  MEDICAL HISTORY:  Past Medical History:  Diagnosis Date  . Cancer ([place])   . Goals of care, counseling/discussion 05/06/2020  . Hypertension   . Knee pain, right     SURGICAL HISTORY: Past Surgical History:  Procedure Laterality Date  . FOOT SURGERY    . TONSILLECTOMY      I have reviewed the social history and family history with the patient and they are unchanged from previous note.  ALLERGIES:  has No Known Allergies.  MEDICATIONS:  Current Outpatient Medications  Medication Sig Dispense Refill  . allopurinol (ZYLOPRIM) 100 MG tablet Take 1 tablet (100 mg total) by mouth daily. 30 tablet 0  . Ascorbic Acid (VITAMIN C WITH ROSE HIPS) 1000 MG tablet Take 1,000 mg by mouth daily.    Marland Kitchen aspirin EC 81 MG tablet Take 81 mg by mouth daily.    . ciprofloxacin (CIPRO) 500 MG tablet Take 1 tablet (500 mg total) by  mouth 2 (two) times daily for 5 days. 10 tablet 0  . clobetasol ointment (TEMOVATE) 8.56 % Apply 1 application topically daily as needed (eczema).     . diphenhydrAMINE (BENADRYL) 25 mg capsule Take 1 capsule (25 mg total) by mouth at bedtime as needed for sleep. 30 capsule 0  . docusate sodium (COLACE) 100 MG capsule Take 200 mg by mouth 2 (two) times daily as needed for mild constipation.     Marland Kitchen doxazosin (CARDURA) 4 MG tablet Take 4 mg by mouth daily.    Marland Kitchen HYDROcodone-acetaminophen (NORCO/VICODIN) 5-325 MG tablet Take 1 tablet by mouth every 6 (six) hours as needed for moderate pain. (Patient taking differently: Take 0.5-1 tablets by mouth every 6 (six) hours as needed for moderate pain. ) 30 tablet 0  . magic mouthwash w/lidocaine SOLN Take 15 mLs by mouth 4 (four) times daily as needed for mouth pain. 400 mL 0  . melatonin 3 MG TABS tablet Take 2 tablets (6 mg total) by mouth at bedtime as needed (sleep). 30 tablet 0  . menthol-cetylpyridinium (CEPACOL) 3 MG lozenge Take 1 lozenge (3 mg total) by mouth as needed for sore throat. 100 tablet 0  . metoprolol tartrate (LOPRESSOR) 25 MG tablet Take 0.5 tablets (12.5 mg total) by mouth 2 (two) times daily. 30 tablet 0  .  Multiple Vitamins-Minerals (MULTIVITAMIN PO) Take 1 tablet by mouth daily.    . Nutritional Supplements (BOOST MAX PROTEIN PO) Take 237 mLs by mouth in the morning and at bedtime. Chocolate    . Omega-3 Fatty Acids (OMEGA 3 PO) Take 1 tablet by mouth daily.    . ondansetron (ZOFRAN) 8 MG tablet Take 1 tablet (8 mg total) by mouth every 8 (eight) hours as needed for nausea or vomiting. 20 tablet 0  . pantoprazole (PROTONIX) 40 MG tablet Take 1 tablet (40 mg total) by mouth daily. 30 tablet 11  . polyethylene glycol powder (GLYCOLAX/MIRALAX) 17 GM/SCOOP powder Take 17 g by mouth daily as needed for mild constipation.     . prochlorperazine (COMPAZINE) 10 MG tablet Take 1 tablet (10 mg total) by mouth every 6 (six) hours as needed for  nausea or vomiting. 30 tablet 0  . senna-docusate (SENOKOT-S) 8.6-50 MG tablet Take 2 tablets by mouth 2 (two) times daily as needed for mild constipation.    . simethicone (MYLICON) 80 MG chewable tablet Chew 1 tablet (80 mg total) by mouth 4 (four) times daily as needed for flatulence. 30 tablet 0  . Sodium Chloride-Sodium Bicarb (SODIUM BICARBONATE/SODIUM CHLORIDE) SOLN 1 application by Mouth Rinse route as needed for dry mouth or mouth pain.    Marland Kitchen triamcinolone cream (KENALOG) 0.1 % Apply 1 application topically 3 (three) times daily as needed for rash.     No current facility-administered medications for this visit.    PHYSICAL EXAMINATION: ECOG PERFORMANCE STATUS: 3 - Symptomatic, >50% confined to bed  Vitals:   05/18/20 1211  BP: (!) 141/69  Pulse: (!) 116  Resp: 18  Temp: 98.9 F (37.2 C)  SpO2: 100%   Filed Weights   05/18/20 1211  Weight: 214 lb 14.4 oz (97.5 kg)     GENERAL:alert, no distress and comfortable SKIN: skin color, texture, turgor are normal, no rashes or significant lesions EYES: normal, Conjunctiva are pink and non-injected, sclera clear  OROPHARYNX:no exudate, no erythema and lips, buccal mucosa, and tongue normal (+) Healing Mouth sores  NECK: supple, thyroid normal size, non-tender, without nodularity LYMPH:  no palpable lymphadenopathy in the cervical, axillary  LUNGS: clear to auscultation and percussion with normal breathing effort HEART: regular rate & rhythm and no murmurs (+) bilateral lower extremity edema ABDOMEN:abdomen soft, non-tender and normal bowel sounds Musculoskeletal:no cyanosis of digits and no clubbing  NEURO: alert & oriented x 3 with fluent speech, no focal motor/sensory deficits  LABORATORY DATA:  I have reviewed the data as listed CBC Latest Ref Rng & Units 05/18/2020 05/15/2020 05/14/2020  WBC 4.0 - 10.5 K/uL 18.0(H) 7.8 1.6(L)  Hemoglobin 13.0 - 17.0 g/dL 8.4(L) 8.2(L) 8.1(L)  Hematocrit 39.0 - 52.0 % 25.4(L) 23.9(L) 23.2(L)   Platelets 150 - 400 K/uL 182 92(L) 60(L)     CMP Latest Ref Rng & Units 05/18/2020 05/15/2020 05/14/2020  Glucose 70 - 99 mg/dL 114(H) 94 94  BUN 8 - 23 mg/dL 10 14 17   Creatinine 0.61 - 1.24 mg/dL 1.08 1.08 1.14  Sodium 135 - 145 mmol/L 136 133(L) 132(L)  Potassium 3.5 - 5.1 mmol/L 4.0 3.3(L) 3.4(L)  Chloride 98 - 111 mmol/L 104 101 100  CO2 22 - 32 mmol/L 25 21(L) 21(L)  Calcium 8.9 - 10.3 mg/dL 8.4(L) 8.1(L) 8.3(L)  Total Protein 6.5 - 8.1 g/dL 5.2(L) 5.0(L) 5.1(L)  Total Bilirubin 0.3 - 1.2 mg/dL 0.8 1.2 1.3(H)  Alkaline Phos 38 - 126 U/L 248(H) 173(H) 146(H)  AST 15 - 41 U/L 42(H) 35 30  ALT 0 - 44 U/L 53(H) 62(H) 68(H)      RADIOGRAPHIC STUDIES: I have personally reviewed the radiological images as listed and agreed with the findings in the report. No results found.   ASSESSMENT & PLAN:  John [name] is a 74 y.o. male with   1. Diffuse large B cell lymphoma, ABC Subtype, stage IV  -After worsening fatigue pt presented to ED. 04/20/20 CT CAP showed large mediastinal mass and diffuse liver masses, and diffuse retroperitoneal lymphadenopathy. 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 Given his liver metastasis and LN involvement, this is stage IV. His lymphoma FISH panel is negative for double hit lymphoma, I discussed with pt today   -s/p cycle 1 R-EPOCH 05/01/20-05/05/2020. Given his high risk for CNS involvement, will give intrathecal treatment with methotrexate prophylactically with C2. Plan to scan him after C3 with PET.  -S/p C1 he developed neutropenic fever, severe pancytopenia required blood and plt transfusion, mild constipation and significant mucositis. He required hospitalization last week. He has been slowly improving since discharge 3 days ago with weight loss and fatigue. Labs reviewed, !BC 18, Hg 8.4, ABC 11.7.  -To give him more time to recover, will postpone start of C2 to 12/20 with 10%  dose reduction. He will be followed as in patent with NP [facility] or Dr Benay Spice. He is agreeable.  -F/u with NP Lacie or Dr Benay Spice as outpatient in 3 weeks.   2. Pancytopenia, secondary to chemo -He was given GCSF after Cycle 1. He still had pancytopenia with neutropenic fever. He was treated as inpatient and required blood and platelet transfusion.  -Labs reviewed today, WBC 18, Hg 8.4, ANC 11.7 (05/18/20). He fatigued and has mild SOB.   3. Fatigue, Low appetite, Mucositis   -secondary to #1  -Fatigue initially Improved on prednisone, eating has improved as well.  -He did have moderate mucositis after C1 chemo. He will continue magic mouth wash. His weight continues to trend down, but he is eating better after hospital discharge. I strongly encouraged him to drink 40 ounces daily.  -His fatigue is likely from anemia. Will give another blood transfusion soon if needed.   4. Hypercalcemia  -Secondary to #1 -resolved now    PLAN:  -Lab and F/u with me on 12/16 -IR [facility] place 12/20 morning and [facility] admit for [facility] on 12/20 over 5 days. Will reduce dose 10%, rituximab and GCSF on 12/27     No problem-specific Assessment & Plan notes found for this encounter.   No orders of the defined types were placed in this encounter.  All questions were answered. The patient knows to call the clinic with any problems, questions or concerns. No barriers to learning was detected. The total time spent in the appointment was 30 minutes.     Truitt Merle, MD 05/18/2020   I, Joslyn Devon, am acting as scribe for Truitt Merle, MD.   I have reviewed the above documentation for accuracy and completeness, and I agree with the above.

## 2020-05-17 ENCOUNTER — Ambulatory Visit (HOSPITAL_COMMUNITY): Payer: Medicare HMO

## 2020-05-17 LAB — HSV DNA BY PCR (REFERENCE LAB)
HSV 1 DNA: NEGATIVE
HSV 2 DNA: NEGATIVE

## 2020-05-18 ENCOUNTER — Inpatient Hospital Stay (HOSPITAL_BASED_OUTPATIENT_CLINIC_OR_DEPARTMENT_OTHER): Payer: Medicare HMO | Admitting: Hematology

## 2020-05-18 ENCOUNTER — Ambulatory Visit (HOSPITAL_COMMUNITY): Payer: Medicare HMO

## 2020-05-18 ENCOUNTER — Inpatient Hospital Stay: Payer: Medicare HMO

## 2020-05-18 ENCOUNTER — Other Ambulatory Visit: Payer: Self-pay

## 2020-05-18 ENCOUNTER — Encounter: Payer: Self-pay | Admitting: Hematology

## 2020-05-18 VITALS — BP 141/69 | HR 116 | Temp 98.9°F | Resp 18 | Ht 71.0 in | Wt 214.9 lb

## 2020-05-18 DIAGNOSIS — C8339 Diffuse large B-cell lymphoma, extranodal and solid organ sites: Secondary | ICD-10-CM

## 2020-05-18 DIAGNOSIS — Z5189 Encounter for other specified aftercare: Secondary | ICD-10-CM | POA: Diagnosis not present

## 2020-05-18 DIAGNOSIS — K5900 Constipation, unspecified: Secondary | ICD-10-CM | POA: Diagnosis not present

## 2020-05-18 DIAGNOSIS — E883 Tumor lysis syndrome: Secondary | ICD-10-CM | POA: Diagnosis not present

## 2020-05-18 DIAGNOSIS — C8338 Diffuse large B-cell lymphoma, lymph nodes of multiple sites: Secondary | ICD-10-CM | POA: Diagnosis not present

## 2020-05-18 DIAGNOSIS — D649 Anemia, unspecified: Secondary | ICD-10-CM | POA: Diagnosis not present

## 2020-05-18 DIAGNOSIS — Z5112 Encounter for antineoplastic immunotherapy: Secondary | ICD-10-CM | POA: Diagnosis not present

## 2020-05-18 DIAGNOSIS — N179 Acute kidney failure, unspecified: Secondary | ICD-10-CM | POA: Diagnosis not present

## 2020-05-18 LAB — CMP (CANCER CENTER ONLY)
ALT: 53 U/L — ABNORMAL HIGH (ref 0–44)
AST: 42 U/L — ABNORMAL HIGH (ref 15–41)
Albumin: 2.6 g/dL — ABNORMAL LOW (ref 3.5–5.0)
Alkaline Phosphatase: 248 U/L — ABNORMAL HIGH (ref 38–126)
Anion gap: 7 (ref 5–15)
BUN: 10 mg/dL (ref 8–23)
CO2: 25 mmol/L (ref 22–32)
Calcium: 8.4 mg/dL — ABNORMAL LOW (ref 8.9–10.3)
Chloride: 104 mmol/L (ref 98–111)
Creatinine: 1.08 mg/dL (ref 0.61–1.24)
GFR, Estimated: >60 mL/min (ref >60)
Glucose, Bld: 114 mg/dL — ABNORMAL HIGH (ref 70–99)
Potassium: 4 mmol/L (ref 3.5–5.1)
Sodium: 136 mmol/L (ref 135–145)
Total Bilirubin: 0.8 mg/dL (ref 0.3–1.2)
Total Protein: 5.2 g/dL — ABNORMAL LOW (ref 6.5–8.1)

## 2020-05-18 LAB — CBC WITH DIFFERENTIAL (CANCER CENTER ONLY)
Abs Immature Granulocytes: 4.2 10*3/uL — ABNORMAL HIGH (ref 0.00–0.07)
Basophils Absolute: 0.1 10*3/uL (ref 0.0–0.1)
Basophils Relative: 1 %
Eosinophils Absolute: 0 10*3/uL (ref 0.0–0.5)
Eosinophils Relative: 0 %
HCT: 25.4 % — ABNORMAL LOW (ref 39.0–52.0)
Hemoglobin: 8.4 g/dL — ABNORMAL LOW (ref 13.0–17.0)
Immature Granulocytes: 23 %
Lymphocytes Relative: 2 %
Lymphs Abs: 0.4 10*3/uL — ABNORMAL LOW (ref 0.7–4.0)
MCH: 29.1 pg (ref 26.0–34.0)
MCHC: 33.1 g/dL (ref 30.0–36.0)
MCV: 87.9 fL (ref 80.0–100.0)
Monocytes Absolute: 1.6 10*3/uL — ABNORMAL HIGH (ref 0.1–1.0)
Monocytes Relative: 9 %
Neutro Abs: 11.7 10*3/uL — ABNORMAL HIGH (ref 1.7–7.7)
Neutrophils Relative %: 65 %
Platelet Count: 182 10*3/uL (ref 150–400)
RBC: 2.89 MIL/uL — ABNORMAL LOW (ref 4.22–5.81)
RDW: 15.4 % (ref 11.5–15.5)
WBC Count: 18 10*3/uL — ABNORMAL HIGH (ref 4.0–10.5)
nRBC: 0.1 % (ref 0.0–0.2)

## 2020-05-18 LAB — CULTURE, BLOOD (ROUTINE X 2)
Culture: NO GROWTH
Special Requests: ADEQUATE

## 2020-05-18 NOTE — Addendum Note (Signed)
Addended by: Truitt Merle on: 05/18/2020 10:43 PM   Modules accepted: Orders

## 2020-05-19 LAB — CULTURE, BLOOD (ROUTINE X 2)

## 2020-05-23 NOTE — Progress Notes (Signed)
John [name]   Telephone:([phone]) [phone] Fax:(336) [phone]   Clinic Follow up Note   Patient Care Team: Christain Sacramento, MD as PCP - General (Family Medicine)  Date of Service:  05/24/2020  CHIEF COMPLAINT: F/u ofDiffuse large B cell lymphoma  SUMMARY OF ONCOLOGIC HISTORY: Oncology History Overview Note  Cancer Staging Diffuse large B cell lymphoma ([place]) Staging form: Hodgkin and Non-Hodgkin Lymphoma, AJCC 8th Edition - Clinical stage from 04/23/2020: Stage IV (Diffuse large B-cell lymphoma) - Signed by Truitt Merle, MD on 04/29/2020    Diffuse large B cell lymphoma ([place])  04/20/2020 Imaging   CT CAP  IMPRESSION: 1. Large anterior mediastinal/prevascular space mass/adenopathy. The mass extends superiorly and abuts the upper trachea. 2. Large hypodense lesion in the right lobe of the liver as well as ill-defined splenic hypodense lesions. Further characterization with MRI without and with contrast recommended. The liver lesion is amenable to percutaneous tissue sampling. 3. Extensive mesenteric and retroperitoneal adenopathy. 4. Sigmoid diverticulosis. No bowel obstruction. Normal appendix. 5. Aortic Atherosclerosis (ICD10-I70.0).   04/23/2020 Cancer Staging   Staging form: Hodgkin and Non-Hodgkin Lymphoma, AJCC 8th Edition - Clinical stage from 04/23/2020: Stage IV (Diffuse large B-cell lymphoma) - Signed by Truitt Merle, MD on 04/29/2020   04/23/2020 Initial Biopsy   FINAL MICROSCOPIC DIAGNOSIS:   A. LIVER, RIGHT MASS, NEEDLE CORE BIOPSY:  - Large B-cell lymphoma  - See comment     COMMENT:   The sections show needle core biopsy fragments displaying effacement of  the architecture by a dense infiltrate of primarily large atypical  lymphoid appearing cells displaying vesicular chromatin and small  nucleoli.  This is associated with apoptosis and brisk mitosis.  The  appearance is diffuse with lack of atypical follicles.  A large battery  of  immunohistochemical stains was performed and shows that the atypical  lymphoid cells are positive for LCA, CD20, PAX 5, BCL-2, BCL6, CD5,  MUM-1 (partial).  The atypical lymphoid cells are negative for CD10,  CD30, CD34, CD138, cyclin D1, TdT, and EBV in situ hybridization,  synaptophysin, cytokeratin AE1/AE3, cytokeratin 20, cytokeratin 7,  cytokeratin 5/6, TTF-1, CD56, CDX2, and p40.  There is an admixed  relatively minor population of T-cells as primarily seen with CD3.  The  overall features are consistent with large B-cell lymphoma, ABC subtype.  The lymphomatous process displays expression of CD5.  This phenotype is  seen in 5-10% of mostly de novo large B-cell lymphoma cases or rarely as  a transformation of a low-grade B-cell lymphoma such as small  lymphocytic lymphoma.  Clinical correlation is recommended.    04/25/2020 Imaging   CT AP  IMPRESSION: 1. Development of high-density ascites in the abdomen and pelvis. Findings are most compatible with a small to moderate amount of hemoperitoneum and related to the recent liver biopsy. 2. Extensive lymphadenopathy throughout the abdomen and pelvis with a large mass or nodal mass near the pancreatic head. There are additional lesions involving the liver and spleen. Findings are suggestive for metastatic disease. 3. Development of small bilateral pleural effusions.   These results were called by telephone at the time of interpretation on 04/25/2020 at 1:43 pm to provider ERIC [place] , who verbally acknowledged these results.   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 He presents to  the clinic with his wife.  He overall feels slightly better than last week, but is still quite fatigued.  He is eating better, however he continues to lose weight, he has lost about 20 pounds in the past 2 weeks.  His mouth sore has resolved, he denies any pain, nausea, or issues with bowel movement.  He is able to function at home, but not very active.  All other systems were reviewed with the patient and are negative.  MEDICAL HISTORY:  Past Medical History:  Diagnosis Date  . Cancer ([place])   . Goals of care, counseling/discussion 05/06/2020  . Hypertension   . Knee pain, right     SURGICAL HISTORY: Past Surgical History:  Procedure Laterality Date  . FOOT SURGERY    . TONSILLECTOMY      I have reviewed the social history and family history with the patient and they are unchanged from previous note.  ALLERGIES:  has No Known Allergies.  MEDICATIONS:  Current Outpatient Medications  Medication Sig Dispense Refill  . allopurinol (ZYLOPRIM) 100 MG tablet Take 1 tablet (100 mg total) by mouth daily. 30 tablet 0  . Ascorbic Acid (VITAMIN C WITH ROSE HIPS) 1000 MG tablet Take 1,000 mg by mouth daily.    Marland Kitchen aspirin EC 81 MG tablet Take 81 mg by mouth daily.    . clobetasol ointment (TEMOVATE) 6.04 % Apply 1 application topically daily as needed (eczema).     . diphenhydrAMINE (BENADRYL) 25 mg capsule Take 1 capsule (25 mg total) by mouth at bedtime as needed for sleep. 30 capsule 0  . docusate sodium (COLACE) 100 MG capsule Take 200 mg by mouth 2 (two) times daily as needed for mild constipation.     Marland Kitchen doxazosin (CARDURA) 4 MG tablet Take 4 mg by mouth daily.    Marland Kitchen HYDROcodone-acetaminophen (NORCO/VICODIN) 5-325 MG tablet Take 1 tablet by mouth every 6 (six) hours as needed for moderate pain. (Patient taking differently: Take 0.5-1 tablets by mouth every 6 (six) hours as needed for moderate pain. ) 30 tablet 0  . magic mouthwash w/lidocaine SOLN Take 15 mLs by mouth 4 (four) times daily as  needed for mouth pain. 400 mL 0  . melatonin 3 MG TABS tablet Take 2 tablets (6 mg total) by mouth at bedtime as needed (sleep). 30 tablet 0  . menthol-cetylpyridinium (CEPACOL) 3 MG lozenge Take 1 lozenge (3 mg total) by mouth as needed for sore throat. 100 tablet 0  . metoprolol tartrate (LOPRESSOR) 25 MG tablet Take 0.5 tablets (12.5 mg total) by mouth 2 (two) times daily. 30 tablet 0  . Multiple Vitamins-Minerals (MULTIVITAMIN PO) Take 1 tablet by mouth daily.    . Nutritional Supplements (BOOST MAX PROTEIN PO) Take 237 mLs by mouth in the morning and at bedtime. Chocolate    . Omega-3 Fatty Acids (OMEGA 3 PO) Take 1 tablet by mouth daily.    . ondansetron (ZOFRAN) 8 MG tablet Take 1 tablet (8 mg total) by mouth every 8 (eight) hours as needed for nausea or vomiting. 20 tablet 0  . pantoprazole (PROTONIX) 40 MG tablet Take 1 tablet (40 mg total) by mouth daily. 30 tablet 11  . polyethylene glycol powder (GLYCOLAX/MIRALAX) 17 GM/SCOOP powder Take 17 g by mouth daily as needed for mild constipation.     . prochlorperazine (COMPAZINE) 10  MG tablet Take 1 tablet (10 mg total) by mouth every 6 (six) hours as needed for nausea or vomiting. 30 tablet 0  . senna-docusate (SENOKOT-S) 8.6-50 MG tablet Take 2 tablets by mouth 2 (two) times daily as needed for mild constipation.    . simethicone (MYLICON) 80 MG chewable tablet Chew 1 tablet (80 mg total) by mouth 4 (four) times daily as needed for flatulence. 30 tablet 0  . Sodium Chloride-Sodium Bicarb (SODIUM BICARBONATE/SODIUM CHLORIDE) SOLN 1 application by Mouth Rinse route as needed for dry mouth or mouth pain.    Marland Kitchen triamcinolone cream (KENALOG) 0.1 % Apply 1 application topically 3 (three) times daily as needed for rash.     No current facility-administered medications for this visit.    PHYSICAL EXAMINATION: ECOG PERFORMANCE STATUS: 2 - Symptomatic, <50% confined to bed  Vitals:   05/24/20 1457  BP: 128/63  Pulse: (!) 102  Resp: 16  Temp:  98.3 F (36.8 C)  SpO2: 100%   Filed Weights   05/24/20 1457  Weight: 198 lb 4.8 oz (89.9 kg)    GENERAL:alert, no distress and comfortable SKIN: skin color, texture, turgor are normal, no rashes or significant lesions EYES: normal, Conjunctiva are pink and non-injected, sclera clear NECK: supple, thyroid normal size, non-tender, without nodularity LYMPH:  no palpable lymphadenopathy in the cervical, axillary  LUNGS: clear to auscultation and percussion with normal breathing effort HEART: regular rate & rhythm and no murmurs and no lower extremity edema ABDOMEN:abdomen soft, non-tender and normal bowel sounds Musculoskeletal:no cyanosis of digits and no clubbing  NEURO: alert & oriented x 3 with fluent speech, no focal motor/sensory deficits  LABORATORY DATA:  I have reviewed the data as listed CBC Latest Ref Rng & Units 05/24/2020 05/18/2020 05/15/2020  WBC 4.0 - 10.5 K/uL 10.9(H) 18.0(H) 7.8  Hemoglobin 13.0 - 17.0 g/dL 9.2(L) 8.4(L) 8.2(L)  Hematocrit 39.0 - 52.0 % 27.1(L) 25.4(L) 23.9(L)  Platelets 150 - 400 K/uL 270 182 92(L)     CMP Latest Ref Rng & Units 05/24/2020 05/18/2020 05/15/2020  Glucose 70 - 99 mg/dL 116(H) 114(H) 94  BUN 8 - 23 mg/dL 9 10 14   Creatinine 0.61 - 1.24 mg/dL 0.97 1.08 1.08  Sodium 135 - 145 mmol/L 138 136 133(L)  Potassium 3.5 - 5.1 mmol/L 4.3 4.0 3.3(L)  Chloride 98 - 111 mmol/L 107 104 101  CO2 22 - 32 mmol/L 26 25 21(L)  Calcium 8.9 - 10.3 mg/dL 8.4(L) 8.4(L) 8.1(L)  Total Protein 6.5 - 8.1 g/dL 6.0(L) 5.2(L) 5.0(L)  Total Bilirubin 0.3 - 1.2 mg/dL 0.8 0.8 1.2  Alkaline Phos 38 - 126 U/L 218(H) 248(H) 173(H)  AST 15 - 41 U/L 68(H) 42(H) 35  ALT 0 - 44 U/L 98(H) 53(H) 62(H)      RADIOGRAPHIC STUDIES: I have personally reviewed the radiological images as listed and agreed with the findings in the report. No results found.   ASSESSMENT & PLAN:  John [name] is a 74 y.o. male with   1.Diffuse large B cell lymphoma, ABC Subtype,  stage IV  -After worsening fatigue pt presented to ED. 04/20/20 CT CAP showedlarge mediastinal mass and diffuse liver masses, and diffuse retroperitoneal lymphadenopathy. -Liver biopsy from 04/23/20 shows diffuse large B-Cell lymphoma,ABC subtype, which carries a worse prognosis. Given his liver metastasis and LN involvement, this is stage IV. His lymphoma FISH panel is negative for double hit lymphoma, I discussed with pt today  -s/p cycle 1 R-EPOCH 05/01/20-05/05/2020. Given his high risk for  CNS involvement, will give intrathecal treatmentwith methotrexateprophylactically with C2. Plan to scan him after C3 with PET.  -S/p C1 he developed neutropenic fever, severe pancytopenia, mild constipation and significant mucositis.  -Cycle chemotherapy has been postponed due to his slow recovery.  He is overall feel better, still quite fatigued, mucositis resolved, eating better.  Plan to proceed with C2 on 12/20 with 20% dose reduction. He will be followed as in patent with NP [facility] or Dr Alvy Bimler. He is agreeable.  -He is scheduled for port placement before admission on December 20.  I called radiology to schedule his intrathecal chemotherapy on December 21 or 22 -He is scheduled for lab, f/u with NP, GCSF and Rituxan on Jun 04, 2020 -will f/u closely after cycle 2, he would likely need a blood transfusion  2. Pancytopenia, secondary to chemo -He was given GCSF after Cycle 1. He still had pancytopenia with neutropenic fever. He was treated as inpatient and required blood and platelet transfusion.  -Neutropenia and thrombocytopenia resolved, anemia improved, lab reviewed with patient, no need of blood transfusion.   3. Fatigue, Low appetite, and significant weight loss -secondary to #1 and chemotherapy -We will request inpatient nutrition consult and PT    PLAN:  -Lab reviewed, adequate to proceed to cycle 2 chemotherapy EPOCH as inpt on 12/20, I will reduce dose by 20% due to his profound  cytopenia and poor tolerance -He is scheduled for port placement on December 20 before admission -Intrathecal chemotherapy methotrexate on day 2 of 3 --He is scheduled for lab, f/u with NP, GCSF and Rituxan on Jun 04, 2020 -I informed inpt chemo team today   No problem-specific Assessment & Plan notes found for this encounter.   Orders Placed This Encounter  Procedures  . DG FLUORO GUIDED LOC OF NEEDLE/CATH TIP FOR SPINAL INJECT LT    Standing Status:   Future    Standing Expiration Date:   05/24/2021    Scheduling Instructions:     Please do LP and IT chemo MTX on 12/21 or 12/22. Pt will be admitted to [facility] on 12/20 for chemo and plan to discharge home on 12/24    Order Specific Question:   Lab orders requested (DO NOT place separate lab orders, these will be automatically ordered during procedure specimen collection):    Answer:   Cytology - Non Pap    Order Specific Question:   Lab orders requested (DO NOT place separate lab orders, these will be automatically ordered during procedure specimen collection):    Answer:   CSF cell count w differential    Order Specific Question:   Reason for Exam (SYMPTOM  OR DIAGNOSIS REQUIRED)    Answer:   IT CHEMO    Order Specific Question:   Preferred Imaging Location?    Answer:   [facility]    Order Specific Question:   Release to patient    Answer:   Immediate   All questions were answered. The patient knows to call the clinic with any problems, questions or concerns. No barriers to learning was detected. The total time spent in the appointment was 40 minutes.     Truitt Merle, MD 05/24/2020   I, Joslyn Devon, am acting as scribe for Truitt Merle, MD.   I have reviewed the above documentation for accuracy and completeness, and I agree with the above.

## 2020-05-24 ENCOUNTER — Encounter: Payer: Self-pay | Admitting: Hematology

## 2020-05-24 ENCOUNTER — Inpatient Hospital Stay (HOSPITAL_BASED_OUTPATIENT_CLINIC_OR_DEPARTMENT_OTHER): Payer: Medicare HMO | Admitting: Hematology

## 2020-05-24 ENCOUNTER — Other Ambulatory Visit: Payer: Self-pay | Admitting: Radiology

## 2020-05-24 ENCOUNTER — Inpatient Hospital Stay: Payer: Medicare HMO

## 2020-05-24 ENCOUNTER — Telehealth: Payer: Self-pay

## 2020-05-24 ENCOUNTER — Other Ambulatory Visit: Payer: Self-pay

## 2020-05-24 VITALS — BP 128/63 | HR 102 | Temp 98.3°F | Resp 16 | Ht 71.0 in | Wt 198.3 lb

## 2020-05-24 DIAGNOSIS — Z5112 Encounter for antineoplastic immunotherapy: Secondary | ICD-10-CM | POA: Diagnosis not present

## 2020-05-24 DIAGNOSIS — C8338 Diffuse large B-cell lymphoma, lymph nodes of multiple sites: Secondary | ICD-10-CM | POA: Diagnosis not present

## 2020-05-24 DIAGNOSIS — C8339 Diffuse large B-cell lymphoma, extranodal and solid organ sites: Secondary | ICD-10-CM

## 2020-05-24 LAB — CBC WITH DIFFERENTIAL (CANCER CENTER ONLY)
Abs Immature Granulocytes: 0.76 10*3/uL — ABNORMAL HIGH (ref 0.00–0.07)
Basophils Absolute: 0.2 10*3/uL — ABNORMAL HIGH (ref 0.0–0.1)
Basophils Relative: 2 %
Eosinophils Absolute: 0 10*3/uL (ref 0.0–0.5)
Eosinophils Relative: 0 %
HCT: 27.1 % — ABNORMAL LOW (ref 39.0–52.0)
Hemoglobin: 9.2 g/dL — ABNORMAL LOW (ref 13.0–17.0)
Immature Granulocytes: 7 %
Lymphocytes Relative: 3 %
Lymphs Abs: 0.3 10*3/uL — ABNORMAL LOW (ref 0.7–4.0)
MCH: 29.8 pg (ref 26.0–34.0)
MCHC: 33.9 g/dL (ref 30.0–36.0)
MCV: 87.7 fL (ref 80.0–100.0)
Monocytes Absolute: 1.2 10*3/uL — ABNORMAL HIGH (ref 0.1–1.0)
Monocytes Relative: 11 %
Neutro Abs: 8.4 10*3/uL — ABNORMAL HIGH (ref 1.7–7.7)
Neutrophils Relative %: 77 %
Platelet Count: 270 10*3/uL (ref 150–400)
RBC: 3.09 MIL/uL — ABNORMAL LOW (ref 4.22–5.81)
RDW: 16.2 % — ABNORMAL HIGH (ref 11.5–15.5)
WBC Count: 10.9 10*3/uL — ABNORMAL HIGH (ref 4.0–10.5)
nRBC: 0.2 % (ref 0.0–0.2)

## 2020-05-24 LAB — CMP (CANCER CENTER ONLY)
ALT: 98 U/L — ABNORMAL HIGH (ref 0–44)
AST: 68 U/L — ABNORMAL HIGH (ref 15–41)
Albumin: 3 g/dL — ABNORMAL LOW (ref 3.5–5.0)
Alkaline Phosphatase: 218 U/L — ABNORMAL HIGH (ref 38–126)
Anion gap: 5 (ref 5–15)
BUN: 9 mg/dL (ref 8–23)
CO2: 26 mmol/L (ref 22–32)
Calcium: 8.4 mg/dL — ABNORMAL LOW (ref 8.9–10.3)
Chloride: 107 mmol/L (ref 98–111)
Creatinine: 0.97 mg/dL (ref 0.61–1.24)
GFR, Estimated: >60 mL/min (ref >60)
Glucose, Bld: 116 mg/dL — ABNORMAL HIGH (ref 70–99)
Potassium: 4.3 mmol/L (ref 3.5–5.1)
Sodium: 138 mmol/L (ref 135–145)
Total Bilirubin: 0.8 mg/dL (ref 0.3–1.2)
Total Protein: 6 g/dL — ABNORMAL LOW (ref 6.5–8.1)

## 2020-05-24 NOTE — Telephone Encounter (Signed)
appt for covid screening on 12/18 at 1140.  I spoke with John [name] and gave her the date time and location for testing.  She verbalized understanding.

## 2020-05-25 NOTE — Addendum Note (Signed)
Addended by: Truitt Merle on: 05/25/2020 12:30 PM   Modules accepted: Orders

## 2020-05-26 ENCOUNTER — Other Ambulatory Visit (HOSPITAL_COMMUNITY)
Admission: RE | Admit: 2020-05-26 | Discharge: 2020-05-26 | Disposition: A | Discharge status: Home / Self Care | Payer: Medicare HMO | Source: Ambulatory Visit | Attending: Hematology | Admitting: Hematology

## 2020-05-26 DIAGNOSIS — Z20822 Contact with and (suspected) exposure to covid-19: Secondary | ICD-10-CM | POA: Insufficient documentation

## 2020-05-26 DIAGNOSIS — Z01812 Encounter for preprocedural laboratory examination: Secondary | ICD-10-CM | POA: Insufficient documentation

## 2020-05-26 LAB — SARS CORONAVIRUS 2 (TAT 6-24 HRS): SARS Coronavirus 2: NEGATIVE

## 2020-05-28 ENCOUNTER — Other Ambulatory Visit: Payer: Self-pay | Admitting: Hematology and Oncology

## 2020-05-28 ENCOUNTER — Ambulatory Visit (HOSPITAL_COMMUNITY)
Admission: RE | Admit: 2020-05-28 | Discharge: 2020-05-28 | Disposition: A | Discharge status: Home / Self Care | Payer: Medicare HMO | Source: Home / Self Care | Attending: Hematology | Admitting: Hematology

## 2020-05-28 ENCOUNTER — Inpatient Hospital Stay (HOSPITAL_COMMUNITY)
Admission: RE | Admit: 2020-05-28 | Discharge: 2020-06-01 | DRG: 829 | Disposition: A | Discharge status: Home / Self Care | Payer: Medicare HMO | Attending: Hematology and Oncology | Admitting: Hematology and Oncology

## 2020-05-28 ENCOUNTER — Encounter (HOSPITAL_COMMUNITY): Payer: Self-pay | Admitting: Hematology

## 2020-05-28 ENCOUNTER — Other Ambulatory Visit: Payer: Self-pay

## 2020-05-28 ENCOUNTER — Ambulatory Visit (HOSPITAL_COMMUNITY)
Admit: 2020-05-28 | Discharge: 2020-05-28 | Disposition: A | Discharge status: Home / Self Care | Payer: Medicare HMO | Attending: Hematology | Admitting: Hematology

## 2020-05-28 ENCOUNTER — Encounter (HOSPITAL_COMMUNITY): Payer: Self-pay

## 2020-05-28 ENCOUNTER — Encounter (HOSPITAL_COMMUNITY): Payer: Self-pay | Admitting: Hematology and Oncology

## 2020-05-28 DIAGNOSIS — K5909 Other constipation: Secondary | ICD-10-CM | POA: Diagnosis present

## 2020-05-28 DIAGNOSIS — Z79899 Other long term (current) drug therapy: Secondary | ICD-10-CM

## 2020-05-28 DIAGNOSIS — C8333 Diffuse large B-cell lymphoma, intra-abdominal lymph nodes: Secondary | ICD-10-CM | POA: Diagnosis present

## 2020-05-28 DIAGNOSIS — T451X5A Adverse effect of antineoplastic and immunosuppressive drugs, initial encounter: Secondary | ICD-10-CM | POA: Diagnosis present

## 2020-05-28 DIAGNOSIS — D72828 Other elevated white blood cell count: Secondary | ICD-10-CM | POA: Diagnosis present

## 2020-05-28 DIAGNOSIS — E440 Moderate protein-calorie malnutrition: Secondary | ICD-10-CM | POA: Diagnosis present

## 2020-05-28 DIAGNOSIS — D638 Anemia in other chronic diseases classified elsewhere: Secondary | ICD-10-CM | POA: Diagnosis present

## 2020-05-28 DIAGNOSIS — T458X5A Adverse effect of other primarily systemic and hematological agents, initial encounter: Secondary | ICD-10-CM | POA: Diagnosis present

## 2020-05-28 DIAGNOSIS — C8338 Diffuse large B-cell lymphoma, lymph nodes of multiple sites: Secondary | ICD-10-CM

## 2020-05-28 DIAGNOSIS — I10 Essential (primary) hypertension: Secondary | ICD-10-CM | POA: Diagnosis present

## 2020-05-28 DIAGNOSIS — D702 Other drug-induced agranulocytosis: Secondary | ICD-10-CM | POA: Diagnosis not present

## 2020-05-28 DIAGNOSIS — Z5111 Encounter for antineoplastic chemotherapy: Principal | ICD-10-CM

## 2020-05-28 DIAGNOSIS — Z20822 Contact with and (suspected) exposure to covid-19: Secondary | ICD-10-CM | POA: Diagnosis present

## 2020-05-28 DIAGNOSIS — Z6827 Body mass index (BMI) 27.0-27.9, adult: Secondary | ICD-10-CM

## 2020-05-28 DIAGNOSIS — D6481 Anemia due to antineoplastic chemotherapy: Secondary | ICD-10-CM | POA: Diagnosis present

## 2020-05-28 DIAGNOSIS — R7401 Elevation of levels of liver transaminase levels: Secondary | ICD-10-CM | POA: Diagnosis not present

## 2020-05-28 DIAGNOSIS — C8330 Diffuse large B-cell lymphoma, unspecified site: Secondary | ICD-10-CM | POA: Diagnosis not present

## 2020-05-28 DIAGNOSIS — C8339 Diffuse large B-cell lymphoma, extranodal and solid organ sites: Secondary | ICD-10-CM

## 2020-05-28 DIAGNOSIS — D61810 Antineoplastic chemotherapy induced pancytopenia: Secondary | ICD-10-CM | POA: Diagnosis not present

## 2020-05-28 DIAGNOSIS — Z7982 Long term (current) use of aspirin: Secondary | ICD-10-CM

## 2020-05-28 DIAGNOSIS — N400 Enlarged prostate without lower urinary tract symptoms: Secondary | ICD-10-CM | POA: Diagnosis present

## 2020-05-28 HISTORY — PX: IR IMAGING GUIDED PORT INSERTION: IMG5740

## 2020-05-28 LAB — CBC WITH DIFFERENTIAL/PLATELET
Abs Immature Granulocytes: 0.41 10*3/uL — ABNORMAL HIGH (ref 0.00–0.07)
Basophils Absolute: 0.3 10*3/uL — ABNORMAL HIGH (ref 0.0–0.1)
Basophils Relative: 2 %
Eosinophils Absolute: 0.1 10*3/uL (ref 0.0–0.5)
Eosinophils Relative: 1 %
HCT: 33.3 % — ABNORMAL LOW (ref 39.0–52.0)
Hemoglobin: 11 g/dL — ABNORMAL LOW (ref 13.0–17.0)
Immature Granulocytes: 4 %
Lymphocytes Relative: 4 %
Lymphs Abs: 0.4 10*3/uL — ABNORMAL LOW (ref 0.7–4.0)
MCH: 29.5 pg (ref 26.0–34.0)
MCHC: 33 g/dL (ref 30.0–36.0)
MCV: 89.3 fL (ref 80.0–100.0)
Monocytes Absolute: 1.3 10*3/uL — ABNORMAL HIGH (ref 0.1–1.0)
Monocytes Relative: 12 %
Neutro Abs: 8.6 10*3/uL — ABNORMAL HIGH (ref 1.7–7.7)
Neutrophils Relative %: 77 %
Platelets: 342 10*3/uL (ref 150–400)
RBC: 3.73 MIL/uL — ABNORMAL LOW (ref 4.22–5.81)
RDW: 16.8 % — ABNORMAL HIGH (ref 11.5–15.5)
WBC: 11.2 10*3/uL — ABNORMAL HIGH (ref 4.0–10.5)
nRBC: 0.2 % (ref 0.0–0.2)

## 2020-05-28 LAB — PROTIME-INR
INR: 1.2 (ref 0.8–1.2)
Prothrombin Time: 15 seconds (ref 11.4–15.2)

## 2020-05-28 MED ORDER — SODIUM CHLORIDE 0.9% FLUSH: 10.0000 mL | INTRAVENOUS | Status: DC | PRN

## 2020-05-28 MED ORDER — FENTANYL CITRATE (PF) 100 MCG/2ML IJ SOLN
INTRAMUSCULAR | Status: AC | PRN
  Administered 2020-05-28 (×2): 50 ug via INTRAVENOUS

## 2020-05-28 MED ORDER — ONDANSETRON HCL 8 MG PO TABS
8.0000 mg | ORAL_TABLET | Freq: Three times a day (TID) | ORAL | Status: DC | PRN
  Administered 2020-05-28: 8 mg via ORAL

## 2020-05-28 MED ORDER — MIDAZOLAM HCL 2 MG/2ML IJ SOLN
INTRAMUSCULAR | Status: AC
  Filled 2020-05-28: qty 4

## 2020-05-28 MED ORDER — PROCHLORPERAZINE MALEATE 10 MG PO TABS: 10.0000 mg | ORAL_TABLET | Freq: Four times a day (QID) | ORAL | Status: DC | PRN

## 2020-05-28 MED ORDER — VINCRISTINE SULFATE CHEMO INJECTION 1 MG/ML
Freq: Once | INTRAVENOUS | Status: AC
  Filled 2020-05-28 (×2): qty 9

## 2020-05-28 MED ORDER — CHLORHEXIDINE GLUCONATE CLOTH 2 % EX PADS
6.0000 | MEDICATED_PAD | Freq: Every day | CUTANEOUS | Status: DC
  Administered 2020-05-28 – 2020-06-01 (×5): 6 via TOPICAL

## 2020-05-28 MED ORDER — ALLOPURINOL 100 MG PO TABS
100.0000 mg | ORAL_TABLET | Freq: Every day | ORAL | Status: DC
  Administered 2020-05-28 – 2020-06-01 (×5): 100 mg via ORAL
  Filled 2020-05-28 (×5): qty 1

## 2020-05-28 MED ORDER — DOCUSATE SODIUM 100 MG PO CAPS
200.0000 mg | ORAL_CAPSULE | Freq: Two times a day (BID) | ORAL | Status: DC | PRN
  Administered 2020-05-28: 200 mg via ORAL
  Filled 2020-05-28: qty 2

## 2020-05-28 MED ORDER — POLYETHYLENE GLYCOL 3350 17 GM/SCOOP PO POWD: 17.0000 g | Freq: Every day | ORAL | Status: DC

## 2020-05-28 MED ORDER — ASCORBIC ACID 500 MG PO TABS
1000.0000 mg | ORAL_TABLET | Freq: Every day | ORAL | Status: DC
  Administered 2020-05-28 – 2020-06-01 (×5): 1000 mg via ORAL
  Filled 2020-05-28 (×5): qty 2

## 2020-05-28 MED ORDER — LIDOCAINE-EPINEPHRINE 1 %-1:100000 IJ SOLN
INTRAMUSCULAR | Status: AC | PRN
  Administered 2020-05-28: 20 mL

## 2020-05-28 MED ORDER — DOXAZOSIN MESYLATE 4 MG PO TABS
4.0000 mg | ORAL_TABLET | Freq: Every day | ORAL | Status: DC
  Administered 2020-05-29 – 2020-06-01 (×4): 4 mg via ORAL
  Filled 2020-05-28 (×4): qty 1

## 2020-05-28 MED ORDER — SENNOSIDES-DOCUSATE SODIUM 8.6-50 MG PO TABS
2.0000 | ORAL_TABLET | Freq: Two times a day (BID) | ORAL | Status: DC
  Administered 2020-05-28 – 2020-06-01 (×8): 2 via ORAL
  Filled 2020-05-28 (×8): qty 2

## 2020-05-28 MED ORDER — MIDAZOLAM HCL 2 MG/2ML IJ SOLN
INTRAMUSCULAR | Status: AC | PRN
  Administered 2020-05-28 (×4): 1 mg via INTRAVENOUS

## 2020-05-28 MED ORDER — CLOBETASOL PROPIONATE 0.05 % EX OINT: 1.0000 "application " | TOPICAL_OINTMENT | Freq: Every day | CUTANEOUS | Status: DC | PRN

## 2020-05-28 MED ORDER — MAGIC MOUTHWASH W/LIDOCAINE
15.0000 mL | Freq: Four times a day (QID) | ORAL | Status: DC | PRN
  Filled 2020-05-28: qty 15

## 2020-05-28 MED ORDER — SIMETHICONE 80 MG PO CHEW: 80.0000 mg | CHEWABLE_TABLET | Freq: Four times a day (QID) | ORAL | Status: DC | PRN

## 2020-05-28 MED ORDER — MENTHOL 3 MG MT LOZG: 1.0000 | LOZENGE | OROMUCOSAL | Status: DC | PRN

## 2020-05-28 MED ORDER — LIDOCAINE-EPINEPHRINE 1 %-1:100000 IJ SOLN
INTRAMUSCULAR | Status: AC
  Filled 2020-05-28: qty 1

## 2020-05-28 MED ORDER — DEXAMETHASONE SODIUM PHOSPHATE 4 MG/ML IJ SOLN: 10.0000 mg | Freq: Once | INTRAMUSCULAR | Status: DC

## 2020-05-28 MED ORDER — CEFAZOLIN SODIUM-DEXTROSE 2-4 GM/100ML-% IV SOLN: 2.0000 g | Freq: Once | INTRAVENOUS | Status: AC

## 2020-05-28 MED ORDER — SENNOSIDES-DOCUSATE SODIUM 8.6-50 MG PO TABS
2.0000 | ORAL_TABLET | Freq: Two times a day (BID) | ORAL | Status: DC | PRN
  Administered 2020-05-28: 2 via ORAL
  Filled 2020-05-28: qty 2

## 2020-05-28 MED ORDER — TRAZODONE HCL 50 MG PO TABS: 50.0000 mg | ORAL_TABLET | Freq: Every evening | ORAL | Status: DC | PRN

## 2020-05-28 MED ORDER — CEFAZOLIN SODIUM-DEXTROSE 2-4 GM/100ML-% IV SOLN
INTRAVENOUS | Status: AC
  Administered 2020-05-28: 2 g via INTRAVENOUS
  Filled 2020-05-28: qty 100

## 2020-05-28 MED ORDER — VINCRISTINE SULFATE CHEMO INJECTION 1 MG/ML
Freq: Once | INTRAVENOUS | Status: AC
  Filled 2020-05-28: qty 9

## 2020-05-28 MED ORDER — ONDANSETRON HCL 8 MG PO TABS
8.0000 mg | ORAL_TABLET | Freq: Once | ORAL | Status: AC
  Filled 2020-05-28: qty 1

## 2020-05-28 MED ORDER — HYDROCODONE-ACETAMINOPHEN 5-325 MG PO TABS
1.0000 | ORAL_TABLET | Freq: Four times a day (QID) | ORAL | Status: DC | PRN
Start: 2020-05-28 — End: 2020-06-01

## 2020-05-28 MED ORDER — ADULT MULTIVITAMIN W/MINERALS CH
ORAL_TABLET | Freq: Every day | ORAL | Status: DC
  Administered 2020-05-28 – 2020-06-01 (×5): 1 via ORAL
  Filled 2020-05-28 (×5): qty 1

## 2020-05-28 MED ORDER — SODIUM CHLORIDE 0.9 % IV SOLN: INTRAVENOUS | Status: DC

## 2020-05-28 MED ORDER — SODIUM BICARBONATE/SODIUM CHLORIDE MOUTHWASH
1.0000 "application " | Freq: Four times a day (QID) | OROMUCOSAL | Status: DC
  Administered 2020-05-28 – 2020-06-01 (×15): 1 via OROMUCOSAL
  Filled 2020-05-28 (×2): qty 1000

## 2020-05-28 MED ORDER — METOPROLOL TARTRATE 25 MG PO TABS
12.5000 mg | ORAL_TABLET | Freq: Two times a day (BID) | ORAL | Status: DC
  Administered 2020-05-28 – 2020-06-01 (×9): 12.5 mg via ORAL
  Filled 2020-05-28 (×9): qty 1

## 2020-05-28 MED ORDER — POLYETHYLENE GLYCOL 3350 17 G PO PACK
17.0000 g | PACK | Freq: Every day | ORAL | Status: DC
  Administered 2020-05-28 – 2020-06-01 (×5): 17 g via ORAL
  Filled 2020-05-28 (×5): qty 1

## 2020-05-28 MED ORDER — DIPHENHYDRAMINE HCL 25 MG PO CAPS: 25.0000 mg | ORAL_CAPSULE | Freq: Every evening | ORAL | Status: DC | PRN

## 2020-05-28 MED ORDER — SODIUM CHLORIDE 0.9% FLUSH
10.0000 mL | Freq: Two times a day (BID) | INTRAVENOUS | Status: DC
  Administered 2020-05-28: 20 mL
  Administered 2020-05-28: 10 mL
  Administered 2020-05-29 – 2020-05-30 (×2): 20 mL
  Administered 2020-05-31 – 2020-06-01 (×3): 10 mL

## 2020-05-28 MED ORDER — MELATONIN 3 MG PO TABS: 6.0000 mg | ORAL_TABLET | Freq: Every evening | ORAL | Status: DC | PRN

## 2020-05-28 MED ORDER — SODIUM CHLORIDE 0.9 % IV SOLN
10.0000 mg | Freq: Once | INTRAVENOUS | Status: AC
  Administered 2020-05-28: 10 mg via INTRAVENOUS
  Filled 2020-05-28: qty 1

## 2020-05-28 MED ORDER — FENTANYL CITRATE (PF) 100 MCG/2ML IJ SOLN
INTRAMUSCULAR | Status: AC
  Filled 2020-05-28: qty 2

## 2020-05-28 MED ORDER — PANTOPRAZOLE SODIUM 40 MG PO TBEC
40.0000 mg | DELAYED_RELEASE_TABLET | Freq: Every day | ORAL | Status: DC
  Administered 2020-05-28 – 2020-06-01 (×5): 40 mg via ORAL
  Filled 2020-05-28 (×5): qty 1

## 2020-05-28 NOTE — Progress Notes (Addendum)
Per MD, pre-meds adjusted to be 8 mg ondansetron every other day due to patient's constipation and then dexamethasone 10 mg daily. Dexamethasone pre-medication to replace patient's prednisone dose as part of EPOCH regimen.  Okay to treat with CMP from 12/16 per Dr. Alvy Bimler.  Eddie Candle, PharmD, BCPS PGY-2 Hematology/Oncology Pharmacy Resident

## 2020-05-28 NOTE — H&P (Addendum)
[place]  Telephone:([phone]) [phone] [phone]   MEDICAL ONCOLOGY - ADMISSION H&P I have seen the patient, examined [name] and agree with the documentation as follows  Reason for Admission: Cycle #2 EPOCH-R  HPI: [name] is a 74 year old Caucasian male, with past medical history of hypertension, BPH.  He was admitted to [facility] in November 2021 due to fatigue and constipation.  He was found to have severe hypercalcemia and CT scan showed a large mediastinal and liver mass with diffuse retroperitoneal lymphadenopathy.  He received IV fluids and pamidronate for treatment of hypercalcemia with resolution.  He also had hyperuricemia and was treated with rasburicase and then placed on allopurinol.  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  The patient was discharged home with a course of prednisone x5 days. Once biopsy resulted, it was recommended [name] to begin inpatient EPOCH-R for treatment of his lymphoma.    The patient is seen this morning for admission to the hospital.  He had a Port-A-Cath placed earlier today by IR.  Tolerated the procedure well overall.  He reports that his appetite is somewhat improved, but has lost weight recently (-11.4% since cycle 1).  Still has generalized fatigue.  Denies headaches and dizziness.  Reports resolution of mucositis.  Denies chest pain, shortness of breath, cough.  Denies abdominal pain, nausea, vomiting.  Bowels are moving well with current bowel regimen.  Lower extremity edema comes and goes.  The patient is seen today for admission for cycle #2 of EPOCH-R.   From cycle 1, his treatment course was complicated by severe constipation, mucositis, pancytopenia and weight loss.  Majority of the side effects has resolved.  He had no significant neuropathy from cycle 1     Past Medical History:  Diagnosis Date   Cancer [facility])     Goals of care, counseling/discussion 05/06/2020   Hypertension    Knee pain, right   :   Past Surgical History:  Procedure Laterality Date   FOOT SURGERY     TONSILLECTOMY    :   No current facility-administered medications for this encounter.   Current Outpatient Medications  Medication Sig Dispense Refill   allopurinol (ZYLOPRIM) 100 MG tablet Take 1 tablet (100 mg total) by mouth daily. 30 tablet 0   Ascorbic Acid (VITAMIN C WITH ROSE HIPS) 1000 MG tablet Take 1,000 mg by mouth daily.     aspirin EC 81 MG tablet Take 81 mg by mouth daily.     clobetasol ointment (TEMOVATE) 9.62 % Apply 1 application topically daily as needed (eczema).      diphenhydrAMINE (BENADRYL) 25 mg capsule Take 1 capsule (25 mg total) by mouth at bedtime as needed for sleep. 30 capsule 0   docusate sodium (COLACE) 100 MG capsule Take 200 mg by mouth 2 (two) times daily as needed for mild constipation.      doxazosin (CARDURA) 4 MG tablet Take 4 mg by mouth daily.     HYDROcodone-acetaminophen (NORCO/VICODIN) 5-325 MG tablet Take 1 tablet by mouth every 6 (six) hours as needed for moderate pain. (Patient taking differently: Take 0.5-1 tablets by mouth every 6 (six) hours as needed for moderate pain. ) 30 tablet 0   magic mouthwash w/lidocaine SOLN Take 15 mLs by mouth 4 (four) times daily as needed for mouth pain. 400 mL 0   melatonin 3 MG TABS tablet Take  2 tablets (6 mg total) by mouth at bedtime as needed (sleep). 30 tablet 0   menthol-cetylpyridinium (CEPACOL) 3 MG lozenge Take 1 lozenge (3 mg total) by mouth as needed for sore throat. 100 tablet 0   metoprolol tartrate (LOPRESSOR) 25 MG tablet Take 0.5 tablets (12.5 mg total) by mouth 2 (two) times daily. 30 tablet 0   Multiple Vitamins-Minerals (MULTIVITAMIN PO) Take 1 tablet by mouth daily.     Nutritional Supplements (BOOST MAX PROTEIN PO) Take 237 mLs by mouth in the morning and at bedtime. Chocolate     Omega-3 Fatty Acids (OMEGA 3 PO) Take 1 tablet  by mouth daily.     ondansetron (ZOFRAN) 8 MG tablet Take 1 tablet (8 mg total) by mouth every 8 (eight) hours as needed for nausea or vomiting. 20 tablet 0   pantoprazole (PROTONIX) 40 MG tablet Take 1 tablet (40 mg total) by mouth daily. 30 tablet 11   polyethylene glycol powder (GLYCOLAX/MIRALAX) 17 GM/SCOOP powder Take 17 g by mouth daily as needed for mild constipation.      prochlorperazine (COMPAZINE) 10 MG tablet Take 1 tablet (10 mg total) by mouth every 6 (six) hours as needed for nausea or vomiting. 30 tablet 0   senna-docusate (SENOKOT-S) 8.6-50 MG tablet Take 2 tablets by mouth 2 (two) times daily as needed for mild constipation.     simethicone (MYLICON) 80 MG chewable tablet Chew 1 tablet (80 mg total) by mouth 4 (four) times daily as needed for flatulence. 30 tablet 0   Sodium Chloride-Sodium Bicarb (SODIUM BICARBONATE/SODIUM CHLORIDE) SOLN 1 application by Mouth Rinse route as needed for dry mouth or mouth pain.     triamcinolone cream (KENALOG) 0.1 % Apply 1 application topically 3 (three) times daily as needed for rash.     Facility-Administered Medications Ordered in Other Encounters  Medication Dose Route Frequency Provider Last Rate Last Admin   0.9 %  sodium chloride infusion   Intravenous Continuous Omohundro, Jennifer C, NP       ceFAZolin (ANCEF) IVPB 2g/100 mL premix  2 g Intravenous Once Jacqualine Mau, NP         No Known Allergies:   Family History  Problem Relation Age of Onset   Diabetes Mellitus II Brother    Skin cancer Mother    Cancer Mother        skin cancer  :   Social History   Socioeconomic History   Marital status: Married    Spouse name: Not on file   Number of children: 0   Years of education: Not on file   Highest education level: Not on file  Occupational History   Occupation: retired Customer service manager   Tobacco Use   Smoking status: Never Smoker   Smokeless tobacco: Never Used  Scientific laboratory technician Use: Never used   Substance and Sexual Activity   Alcohol use: No   Drug use: No   Sexual activity: Not on file  Other Topics Concern   Not on file  Social History Narrative   Not on file   Social Determinants of Health   Financial Resource Strain: Not on file  Food Insecurity: Not on file  Transportation Needs: Not on file  Physical Activity: Not on file  Stress: Not on file  Social Connections: Not on file  Intimate Partner Violence: Not on file  :  Review of Systems: A comprehensive 14 point review of systems was negative except as noted in the  HPI.  Exam: No data found.  General:  well-nourished in no acute distress.   Eyes:  no scleral icterus.   ENT:  There were no oropharyngeal lesions.     Respiratory: lungs were clear bilaterally without wheezing or crackles.   Cardiovascular:  Regular rate and rhythm, S1/S2, without murmur, rub or gallop.  No pedal edema bilaterally. GI:  abdomen was soft, flat, nontender, nondistended, without organomegaly.   Musculoskeletal: Strength symmetrical in the upper and lower extremities. Skin exam was without echymosis, petichae.   Neuro exam was nonfocal. Patient was alert and oriented.  Attention was good.   Language was appropriate.  Mood was normal without depression.  Speech was not pressured.  Thought content was not tangential.     Lab Results  Component Value Date   WBC 10.9 (H) 05/24/2020   HGB 9.2 (L) 05/24/2020   HCT 27.1 (L) 05/24/2020   PLT 270 05/24/2020   GLUCOSE 116 (H) 05/24/2020   ALT 98 (H) 05/24/2020   AST 68 (H) 05/24/2020   NA 138 05/24/2020   K 4.3 05/24/2020   CL 107 05/24/2020   CREATININE 0.97 05/24/2020   BUN 9 05/24/2020   CO2 26 05/24/2020    CT ABDOMEN PELVIS W CONTRAST  Result Date: 05/06/2020 CLINICAL DATA:  Abdominal pain. Increased liver function tests. Large B-cell lymphoma. Recent liver biopsy with hemoperitoneum. EXAM: CT ABDOMEN AND PELVIS WITH CONTRAST TECHNIQUE: Multidetector CT imaging of the  abdomen and pelvis was performed using the standard protocol following bolus administration of intravenous contrast. CONTRAST:  27mL OMNIPAQUE IOHEXOL 300 MG/ML  SOLN COMPARISON:  04/25/2020 FINDINGS: Lower chest: Small left and trace right pleural effusions. Descending thoracic aortic atherosclerosis. Mild mitral valve calcification. Hepatobiliary: Dominant right hepatic lobe lesion 10.8 by 10.4 cm on image 16 of series 2, formerly about 9.1 cm based on my measurement. Mixed internal density favoring internal hematoma. There is also increased prominence of a lobulation along the inferior border possibly also representing hematoma or additional lesion measuring 6.0 by 5.0 cm on image 24 series 2. Dependent density in the gallbladder favoring sludge, blood products, or hematoma. No biliary dilatation. Pancreas: Mesenteric mass mass along the inferior margin of the head of the pancreas measuring 4.5 by 3.7 cm on image 36 of series 2, roughly stable from prior. Spleen: Multiple masses in the spleen are present, little larger lesions measures 5.0 by 3.9 cm on image 19 of series 2. These were present previously although much less conspicuous due to the lack of IV contrast. Adrenals/Urinary Tract: Small nodule along the inferior margin of the right adrenal gland measuring 1.0 by 1.3 cm, probably an adenoma based on prior imaging but technically nonspecific on today's exam. Hypodense 2.2 by 2.4 cm lesion in the left mid upper kidney with fluid density, favoring cyst. Urinary bladder unremarkable. Stomach/Bowel: Air-levels in the distal colon favoring diarrheal process. Sigmoid colon diverticulosis. There is stranding along the sigmoid colon such that low-grade diverticulitis cannot be readily excluded. Vascular/Lymphatic: Aortoiliac atherosclerotic vascular disease. Scattered pathologic adenopathy. Adjacent to the pancreatic tail and splenic hilum, there are clustered abnormal lymph nodes with index node measuring 1.8 cm  in short axis on image 23 of series 2, formerly 1.6 cm. Portacaval node 1.8 cm in short axis on image 28 series 2, formerly 2.1 cm. Left periaortic node 1.5 cm in short axis on image 36 series 2, formerly 1.7 cm. Left external iliac node 1.4 cm in short axis on image 60 of series 2, formerly  the same. Left common iliac node 1.3 cm in short axis on image 51 series 2, formerly 1.8 cm. Other additional enlarged lymph nodes are present, the generally the lymph nodes are mildly reduced in size compared to the prior exam. Reproductive: Prostatomegaly Other: Complex ascites mildly reduced from the prior exam compatible with hemoperitoneum. Musculoskeletal: Bridging spurring of the sacroiliac joints. 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 Mesenteric and subcutaneous edema compatible with third spacing of fluid. IMPRESSION: 1. Mild improvement in the adenopathy in the abdomen and pelvis. 2. Mild reduction in size of the complex ascites compatible with hemoperitoneum. 3. Large bilobed mass in the liver with internal heterogeneous high density probably from internal hematoma, difficult to compare in size to the prior exam due to low conspicuity on the prior exam. 4. Stable appearance of the mesenteric mass along the inferior margin of the head of the pancreas. 5. Multiple splenic masses, some of which are similar to prior, difficult to compare in size due to low conspicuity on prior noncontrast exam. 6. Air-levels in the distal colon favoring diarrheal process. 7. Sigmoid colon diverticulosis. There is stranding along the sigmoid colon such that low-grade diverticulitis cannot be readily excluded. 8. Small left and trace right pleural effusions. 9. Prostatomegaly. 10. Multilevel lumbar impingement. 11. Small right adrenal nodule, probably an adenoma based on prior imaging but technically  nonspecific on today's exam. 12. 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 13. Mesenteric and subcutaneous edema compatible with third spacing of fluid. 14. Aortic atherosclerosis. Aortic Atherosclerosis (ICD10-I70.0). Electronically Signed   By: Van Clines M.D.   On: 05/06/2020 13:56   DG Chest Port 1 View  Result Date: 05/12/2020 CLINICAL DATA:  Sepsis EXAM: PORTABLE CHEST 1 VIEW COMPARISON:  April 20, 2020 FINDINGS: There is a subtle right infrahilar airspace opacity. No pneumothorax. No large pleural effusion. The heart size is stable. Aortic calcifications are noted. The previously demonstrated adenopathy in the upper mediastinum is improved. IMPRESSION: 1. Subtle right infrahilar airspace opacity may represent atelectasis or infiltrate. 2. Improved mediastinal adenopathy. 3. No pneumothorax or large pleural effusion. Electronically Signed   By: Constance Holster M.D.   On: 05/12/2020 21:16   DG Abd Portable 1V  Result Date: 05/03/2020 CLINICAL DATA:  Abdominal distension. EXAM: PORTABLE ABDOMEN - 1 VIEW COMPARISON:  CT AP from 04/25/2020 FINDINGS: The bowel gas pattern is normal. No radio-opaque calculi or other significant radiographic abnormality are seen. Lumbar spondylosis identified. IMPRESSION: Nonobstructive bowel gas pattern. Electronically Signed   By: Kerby Moors M.D.   On: 05/03/2020 08:41   ECHOCARDIOGRAM COMPLETE  Result Date: 04/30/2020    ECHOCARDIOGRAM REPORT   Patient Name:   JIVAN SYMANSKI Date of Exam: 04/30/2020 Medical Rec #:  [id number]       Height:       71.0 in Accession #:    [id number]      Weight:       222.7 lb Date of Birth:  1945-12-12       BSA:          2.207 m Patient Age:    61 years        BP:           165/78 mmHg Patient Gender: M  HR:           85 bpm. Exam Location:  Inpatient Procedure: 2D Echo,  Color Doppler, Cardiac Doppler, Strain Analysis and 3D Echo Indications:    Pre-Chemo Evaluation v67.2  History:        Patient has prior history of Echocardiogram examinations, most                 recent 06/23/2018. Risk Factors:Hypertension.  Sonographer:    Raquel Sarna Senior RDCS Referring Phys: [id number] [place]  1. Left ventricular ejection fraction, by estimation, is 60 to 65%. The left ventricle has normal function. The left ventricle has no regional wall motion abnormalities. There is moderate left ventricular hypertrophy of the basal-septal segment. Left ventricular diastolic parameters are consistent with Grade I diastolic dysfunction (impaired relaxation). Elevated left ventricular end-diastolic pressure. The average left ventricular global longitudinal strain is -18.4 %. The global longitudinal strain  is normal.  2. Right ventricular systolic function is normal. The right ventricular size is normal. There is normal pulmonary artery systolic pressure. The estimated right ventricular systolic pressure is 78.6 mmHg.  3. Left atrial size was severely dilated.  4. The mitral valve is normal in structure. Trivial mitral valve regurgitation. No evidence of mitral stenosis.  5. The aortic valve is normal in structure. Aortic valve regurgitation is not visualized. No aortic stenosis is present.  6. Aortic dilatation noted. There is borderline dilatation of the aortic root, measuring 37 mm.  7. The inferior vena cava is dilated in size with >50% respiratory variability, suggesting right atrial pressure of 8 mmHg. FINDINGS  Left Ventricle: Left ventricular ejection fraction, by estimation, is 60 to 65%. The left ventricle has normal function. The left ventricle has no regional wall motion abnormalities. The average left ventricular global longitudinal strain is -18.4 %. The global longitudinal strain is normal. The left ventricular internal cavity size was normal in size. There is moderate left ventricular  hypertrophy of the basal-septal segment. Left ventricular diastolic parameters are consistent with Grade I diastolic dysfunction (impaired relaxation). Elevated left ventricular end-diastolic pressure. Right Ventricle: The right ventricular size is normal. No increase in right ventricular wall thickness. Right ventricular systolic function is normal. There is normal pulmonary artery systolic pressure. The tricuspid regurgitant velocity is 2.62 m/s, and  with an assumed right atrial pressure of 8 mmHg, the estimated right ventricular systolic pressure is 76.7 mmHg. Left Atrium: Left atrial size was severely dilated. Right Atrium: Right atrial size was normal in size. Pericardium: There is no evidence of pericardial effusion. Mitral Valve: The mitral valve is normal in structure. There is mild calcification of the anterior mitral valve leaflet(s). Mild mitral annular calcification. Trivial mitral valve regurgitation. No evidence of mitral valve stenosis. Tricuspid Valve: The tricuspid valve is normal in structure. Tricuspid valve regurgitation is trivial. No evidence of tricuspid stenosis. Aortic Valve: The aortic valve is normal in structure. Aortic valve regurgitation is not visualized. No aortic stenosis is present. Pulmonic Valve: The pulmonic valve was normal in structure. Pulmonic valve regurgitation is not visualized. No evidence of pulmonic stenosis. Aorta: Aortic dilatation noted. There is borderline dilatation of the aortic root, measuring 37 mm. Venous: The inferior vena cava is dilated in size with greater than 50% respiratory variability, suggesting right atrial pressure of 8 mmHg. IAS/Shunts: The interatrial septum appears to be lipomatous. No atrial level shunt detected by color flow Doppler.  LEFT VENTRICLE PLAX 2D LVIDd:         3.90 cm  Diastology LVIDs:         2.60 cm  LV e' medial:    7.40 cm/s LV PW:         1.13 cm  LV E/e' medial:  13.2 LV IVS:        1.60 cm  LV e' lateral:   5.11 cm/s LVOT  diam:     1.90 cm  LV E/e' lateral: 19.1 LV SV:         80 LV SV Index:   36       2D Longitudinal Strain LVOT Area:     2.84 cm 2D Strain GLS (A2C):   -17.7 %                         2D Strain GLS (A3C):   -19.8 %                         2D Strain GLS (A4C):   -17.6 %                         2D Strain GLS Avg:     -18.4 %                          3D Volume EF:                         3D EF:        59 %                         LV EDV:       204 ml                         LV ESV:       83 ml                         LV SV:        121 ml RIGHT VENTRICLE RV S prime:     15.80 cm/s TAPSE (M-mode): 2.6 cm LEFT ATRIUM              Index       RIGHT ATRIUM           Index LA diam:        4.40 cm  1.99 cm/m  RA Area:     19.70 cm LA Vol (A2C):   124.0 ml 56.19 ml/m RA Volume:   53.90 ml  24.42 ml/m LA Vol (A4C):   92.1 ml  41.73 ml/m LA Biplane Vol: 108.0 ml 48.94 ml/m  AORTIC VALVE LVOT Vmax:   134.00 cm/s LVOT Vmean:  85.400 cm/s LVOT VTI:    0.282 m  AORTA Ao Root diam: 3.70 cm Ao Asc diam:  3.30 cm MITRAL VALVE                TRICUSPID VALVE MV Area (PHT): 2.69 cm     TR Peak grad:   27.5 mmHg MV Decel Time: 282 msec     TR Vmax:        262.00 cm/s MV E velocity: 97.70 cm/s MV A velocity: 113.00 cm/s  SHUNTS MV E/A ratio:  0.86  Systemic VTI:  0.28 m                             Systemic Diam: 1.90 cm John Him MD Electronically signed by John Him MD Signature Date/Time: 04/30/2020/2:29:48 PM    Final    IR PICC PLACEMENT RIGHT >5 YRS INC IMG GUIDE  Result Date: 05/01/2020 INDICATION: 74 year old male in need of a dual lumen PICC for chemotherapy. EXAM: PICC LINE PLACEMENT WITH ULTRASOUND AND FLUOROSCOPIC GUIDANCE MEDICATIONS: None. ANESTHESIA/SEDATION: None. FLUOROSCOPY TIME:  Fluoroscopy Time: 0 minutes 6 seconds (3 mGy). COMPLICATIONS: None immediate. PROCEDURE: The patient was advised of the possible risks and complications and agreed to undergo the procedure. The patient was then brought to  the angiographic suite for the procedure. The right arm was prepped with chlorhexidine, draped in the usual sterile fashion using maximum barrier technique (cap and mask, sterile gown, sterile gloves, large sterile sheet, hand hygiene and cutaneous antisepsis) and infiltrated locally with 1% Lidocaine. Ultrasound demonstrated patency of the right basilic vein, and this was documented with an image. Under real-time ultrasound guidance, this vein was accessed with a 21 gauge micropuncture needle and image documentation was performed. A 0.018 wire was introduced in to the vein. Over this, a 6 [place] dual lumen power-injectable PICC was advanced to the lower SVC/right atrial junction. Fluoroscopy during the procedure and fluoro spot radiograph confirms appropriate catheter position. The catheter was flushed and covered with a sterile dressing. Catheter length: 40 cm IMPRESSION: Successful right arm Power PICC line placement with ultrasound and fluoroscopic guidance. The catheter is ready for use. Electronically Signed   By: Jacqulynn Cadet M.D.   On: 05/01/2020 15:56     CT ABDOMEN PELVIS W CONTRAST  Result Date: 05/06/2020 CLINICAL DATA:  Abdominal pain. Increased liver function tests. Large B-cell lymphoma. Recent liver biopsy with hemoperitoneum. EXAM: CT ABDOMEN AND PELVIS WITH CONTRAST TECHNIQUE: Multidetector CT imaging of the abdomen and pelvis was performed using the standard protocol following bolus administration of intravenous contrast. CONTRAST:  59mL OMNIPAQUE IOHEXOL 300 MG/ML  SOLN COMPARISON:  04/25/2020 FINDINGS: Lower chest: Small left and trace right pleural effusions. Descending thoracic aortic atherosclerosis. Mild mitral valve calcification. Hepatobiliary: Dominant right hepatic lobe lesion 10.8 by 10.4 cm on image 16 of series 2, formerly about 9.1 cm based on my measurement. Mixed internal density favoring internal hematoma. There is also increased prominence of a lobulation along the  inferior border possibly also representing hematoma or additional lesion measuring 6.0 by 5.0 cm on image 24 series 2. Dependent density in the gallbladder favoring sludge, blood products, or hematoma. No biliary dilatation. Pancreas: Mesenteric mass mass along the inferior margin of the head of the pancreas measuring 4.5 by 3.7 cm on image 36 of series 2, roughly stable from prior. Spleen: Multiple masses in the spleen are present, little larger lesions measures 5.0 by 3.9 cm on image 19 of series 2. These were present previously although much less conspicuous due to the lack of IV contrast. Adrenals/Urinary Tract: Small nodule along the inferior margin of the right adrenal gland measuring 1.0 by 1.3 cm, probably an adenoma based on prior imaging but technically nonspecific on today's exam. Hypodense 2.2 by 2.4 cm lesion in the left mid upper kidney with fluid density, favoring cyst. Urinary bladder unremarkable. Stomach/Bowel: Air-levels in the distal colon favoring diarrheal process. Sigmoid colon diverticulosis. There is stranding along the sigmoid colon such that low-grade diverticulitis cannot be  readily excluded. Vascular/Lymphatic: Aortoiliac atherosclerotic vascular disease. Scattered pathologic adenopathy. Adjacent to the pancreatic tail and splenic hilum, there are clustered abnormal lymph nodes with index node measuring 1.8 cm in short axis on image 23 of series 2, formerly 1.6 cm. Portacaval node 1.8 cm in short axis on image 28 series 2, formerly 2.1 cm. Left periaortic node 1.5 cm in short axis on image 36 series 2, formerly 1.7 cm. Left external iliac node 1.4 cm in short axis on image 60 of series 2, formerly the same. Left common iliac node 1.3 cm in short axis on image 51 series 2, formerly 1.8 cm. Other additional enlarged lymph nodes are present, the generally the lymph nodes are mildly reduced in size compared to the prior exam. Reproductive: Prostatomegaly Other: Complex ascites mildly reduced  from the prior exam compatible with hemoperitoneum. Musculoskeletal: Bridging spurring of the sacroiliac joints. 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 Mesenteric and subcutaneous edema compatible with third spacing of fluid. IMPRESSION: 1. Mild improvement in the adenopathy in the abdomen and pelvis. 2. Mild reduction in size of the complex ascites compatible with hemoperitoneum. 3. Large bilobed mass in the liver with internal heterogeneous high density probably from internal hematoma, difficult to compare in size to the prior exam due to low conspicuity on the prior exam. 4. Stable appearance of the mesenteric mass along the inferior margin of the head of the pancreas. 5. Multiple splenic masses, some of which are similar to prior, difficult to compare in size due to low conspicuity on prior noncontrast exam. 6. Air-levels in the distal colon favoring diarrheal process. 7. Sigmoid colon diverticulosis. There is stranding along the sigmoid colon such that low-grade diverticulitis cannot be readily excluded. 8. Small left and trace right pleural effusions. 9. Prostatomegaly. 10. Multilevel lumbar impingement. 11. Small right adrenal nodule, probably an adenoma based on prior imaging but technically nonspecific on today's exam. 12. 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 13. Mesenteric and subcutaneous edema compatible with third spacing of fluid. 14. Aortic atherosclerosis. Aortic Atherosclerosis (ICD10-I70.0). Electronically Signed   By: Van Clines M.D.   On: 05/06/2020 13:56   DG Chest Port 1 View  Result Date: 05/12/2020 CLINICAL DATA:  Sepsis EXAM: PORTABLE CHEST 1 VIEW COMPARISON:  April 20, 2020 FINDINGS: There is a subtle  right infrahilar airspace opacity. No pneumothorax. No large pleural effusion. The heart size is stable. Aortic calcifications are noted. The previously demonstrated adenopathy in the upper mediastinum is improved. IMPRESSION: 1. Subtle right infrahilar airspace opacity may represent atelectasis or infiltrate. 2. Improved mediastinal adenopathy. 3. No pneumothorax or large pleural effusion. Electronically Signed   By: Constance Holster M.D.   On: 05/12/2020 21:16   DG Abd Portable 1V  Result Date: 05/03/2020 CLINICAL DATA:  Abdominal distension. EXAM: PORTABLE ABDOMEN - 1 VIEW COMPARISON:  CT AP from 04/25/2020 FINDINGS: The bowel gas pattern is normal. No radio-opaque calculi or other significant radiographic abnormality are seen. Lumbar spondylosis identified. IMPRESSION: Nonobstructive bowel gas pattern. Electronically Signed   By: Kerby Moors M.D.   On: 05/03/2020 08:41   ECHOCARDIOGRAM COMPLETE  Result Date: 04/30/2020    ECHOCARDIOGRAM REPORT   Patient Name:   John [name] Date of Exam: 04/30/2020 Medical Rec #:  [id number]  Height:       71.0 in Accession #:    [id number]      Weight:       222.7 lb Date of Birth:  1946-04-25       BSA:          2.207 m Patient Age:    54 years        BP:           165/78 mmHg Patient Gender: M               HR:           85 bpm. Exam Location:  Inpatient Procedure: 2D Echo, Color Doppler, Cardiac Doppler, Strain Analysis and 3D Echo Indications:    Pre-Chemo Evaluation v67.2  History:        Patient has prior history of Echocardiogram examinations, most                 recent 06/23/2018. Risk Factors:Hypertension.  Sonographer:    Raquel Sarna Senior RDCS Referring Phys: [id number] [place]  1. Left ventricular ejection fraction, by estimation, is 60 to 65%. The left ventricle has normal function. The left ventricle has no regional wall motion abnormalities. There is moderate left ventricular hypertrophy of the basal-septal segment. Left ventricular  diastolic parameters are consistent with Grade I diastolic dysfunction (impaired relaxation). Elevated left ventricular end-diastolic pressure. The average left ventricular global longitudinal strain is -18.4 %. The global longitudinal strain  is normal.  2. Right ventricular systolic function is normal. The right ventricular size is normal. There is normal pulmonary artery systolic pressure. The estimated right ventricular systolic pressure is 73.5 mmHg.  3. Left atrial size was severely dilated.  4. The mitral valve is normal in structure. Trivial mitral valve regurgitation. No evidence of mitral stenosis.  5. The aortic valve is normal in structure. Aortic valve regurgitation is not visualized. No aortic stenosis is present.  6. Aortic dilatation noted. There is borderline dilatation of the aortic root, measuring 37 mm.  7. The inferior vena cava is dilated in size with >50% respiratory variability, suggesting right atrial pressure of 8 mmHg. FINDINGS  Left Ventricle: Left ventricular ejection fraction, by estimation, is 60 to 65%. The left ventricle has normal function. The left ventricle has no regional wall motion abnormalities. The average left ventricular global longitudinal strain is -18.4 %. The global longitudinal strain is normal. The left ventricular internal cavity size was normal in size. There is moderate left ventricular hypertrophy of the basal-septal segment. Left ventricular diastolic parameters are consistent with Grade I diastolic dysfunction (impaired relaxation). Elevated left ventricular end-diastolic pressure. Right Ventricle: The right ventricular size is normal. No increase in right ventricular wall thickness. Right ventricular systolic function is normal. There is normal pulmonary artery systolic pressure. The tricuspid regurgitant velocity is 2.62 m/s, and  with an assumed right atrial pressure of 8 mmHg, the estimated right ventricular systolic pressure is 32.9 mmHg. Left Atrium: Left  atrial size was severely dilated. Right Atrium: Right atrial size was normal in size. Pericardium: There is no evidence of pericardial effusion. Mitral Valve: The mitral valve is normal in structure. There is mild calcification of the anterior mitral valve leaflet(s). Mild mitral annular calcification. Trivial mitral valve regurgitation. No evidence of mitral valve stenosis. Tricuspid Valve: The tricuspid valve is normal in structure. Tricuspid valve regurgitation is trivial. No evidence of tricuspid stenosis. Aortic Valve: The aortic valve is normal in structure. Aortic valve regurgitation is not visualized.  No aortic stenosis is present. Pulmonic Valve: The pulmonic valve was normal in structure. Pulmonic valve regurgitation is not visualized. No evidence of pulmonic stenosis. Aorta: Aortic dilatation noted. There is borderline dilatation of the aortic root, measuring 37 mm. Venous: The inferior vena cava is dilated in size with greater than 50% respiratory variability, suggesting right atrial pressure of 8 mmHg. IAS/Shunts: The interatrial septum appears to be lipomatous. No atrial level shunt detected by color flow Doppler.  LEFT VENTRICLE PLAX 2D LVIDd:         3.90 cm  Diastology LVIDs:         2.60 cm  LV e' medial:    7.40 cm/s LV PW:         1.13 cm  LV E/e' medial:  13.2 LV IVS:        1.60 cm  LV e' lateral:   5.11 cm/s LVOT diam:     1.90 cm  LV E/e' lateral: 19.1 LV SV:         80 LV SV Index:   36       2D Longitudinal Strain LVOT Area:     2.84 cm 2D Strain GLS (A2C):   -17.7 %                         2D Strain GLS (A3C):   -19.8 %                         2D Strain GLS (A4C):   -17.6 %                         2D Strain GLS Avg:     -18.4 %                          3D Volume EF:                         3D EF:        59 %                         LV EDV:       204 ml                         LV ESV:       83 ml                         LV SV:        121 ml RIGHT VENTRICLE RV S prime:     15.80 cm/s TAPSE  (M-mode): 2.6 cm LEFT ATRIUM              Index       RIGHT ATRIUM           Index LA diam:        4.40 cm  1.99 cm/m  RA Area:     19.70 cm LA Vol (A2C):   124.0 ml 56.19 ml/m RA Volume:   53.90 ml  24.42 ml/m LA Vol (A4C):   92.1 ml  41.73 ml/m LA Biplane Vol: 108.0 ml 48.94 ml/m  AORTIC VALVE LVOT Vmax:   134.00 cm/s LVOT Vmean:  85.400 cm/s LVOT VTI:  0.282 m  AORTA Ao Root diam: 3.70 cm Ao Asc diam:  3.30 cm MITRAL VALVE                TRICUSPID VALVE MV Area (PHT): 2.69 cm     TR Peak grad:   27.5 mmHg MV Decel Time: 282 msec     TR Vmax:        262.00 cm/s MV E velocity: 97.70 cm/s MV A velocity: 113.00 cm/s  SHUNTS MV E/A ratio:  0.86         Systemic VTI:  0.28 m                             Systemic Diam: 1.90 cm John Him MD Electronically signed by John Him MD Signature Date/Time: 04/30/2020/2:29:48 PM    Final    IR PICC PLACEMENT RIGHT >5 YRS INC IMG GUIDE  Result Date: 05/01/2020 INDICATION: 74 year old male in need of a dual lumen PICC for chemotherapy. EXAM: PICC LINE PLACEMENT WITH ULTRASOUND AND FLUOROSCOPIC GUIDANCE MEDICATIONS: None. ANESTHESIA/SEDATION: None. FLUOROSCOPY TIME:  Fluoroscopy Time: 0 minutes 6 seconds (3 mGy). COMPLICATIONS: None immediate. PROCEDURE: The patient was advised of the possible risks and complications and agreed to undergo the procedure. The patient was then brought to the angiographic suite for the procedure. The right arm was prepped with chlorhexidine, draped in the usual sterile fashion using maximum barrier technique (cap and mask, sterile gown, sterile gloves, large sterile sheet, hand hygiene and cutaneous antisepsis) and infiltrated locally with 1% Lidocaine. Ultrasound demonstrated patency of the right basilic vein, and this was documented with an image. Under real-time ultrasound guidance, this vein was accessed with a 21 gauge micropuncture needle and image documentation was performed. A 0.018 wire was introduced in to the vein. Over  this, a 6 [place] dual lumen power-injectable PICC was advanced to the lower SVC/right atrial junction. Fluoroscopy during the procedure and fluoro spot radiograph confirms appropriate catheter position. The catheter was flushed and covered with a sterile dressing. Catheter length: 40 cm IMPRESSION: Successful right arm Power PICC line placement with ultrasound and fluoroscopic guidance. The catheter is ready for use. Electronically Signed   By: Jacqulynn Cadet M.D.   On: 05/01/2020 15:56   Assessment and Plan:   1.  Stage IVa diffuse large B-cell lymphoma, ABC subtype 2.  Fatigue secondary #1 3.  Anemia secondary to recent chemotherapy 4. Leukocytosis due to recent Neulasta 5.  Transaminitis  -Labs from today have been reviewed.  Adequate to proceed with chemotherapy.  Recommend for [name] to proceed with day 1 of cycle #2 of EPOCH-R.  -We will monitor labs closely for signs of tumor lysis syndrome.  A daily CBC with differential, CMET, uric acid have been ordered. -He is also at high risk for CNS involvement due to high risk features and will plan to begin intrathecal methotrexate with this cycle.  Will ask for radiology to administer this on day 2 or day 3 of the cycle of chemotherapy. -Aspirin has been placed on hold in anticipation of intrathecal methotrexate.  Prophylactic Lovenox has not been ordered due to impending procedure.  Will reevaluate post procedure.  SCDs for DVT prophylaxis. -We will continue home bowel regimen including MiraLAX daily and Senokot S 2 tablets twice a day.  He also has Colace ordered as needed. -He has Zofran and Compazine as needed for nausea and vomiting. -Continue allopurinol and other home medications. -Sodium bicarbonate mouth  rinses and Magic mouthwash have been ordered. -Will order intrathecal methotrexate administration by radiology on day 2 or day 3 of this cycle of chemotherapy. -He will follow up on 06/04/2020 for labs, visit, Rituxan, and Neulasta.  I  agree with documentation as above.  We will proceed despite elevated liver enzymes.  I have reviewed premedications with pharmacy.  I will switch out prednisone and substitute with IV dexamethasone.  I will only schedule Zofran every other day in view of severe constipation in the past.  We have extensive discussion today regarding management for constipation and he is on scheduled laxatives.  Mikey Bussing, DNP, AGPCNP-BC, AOCNP Heath Lark, MD

## 2020-05-28 NOTE — H&P (Signed)
Referring Physician(s): Feng,Yan  Supervising Physician: Sandi Mariscal  Patient Status:  [facility]  Chief Complaint:  "I'm here for a port a cath"  Subjective: Pt familiar to IR service from right liver lesion biopsy on 04/23/20 and PICC placement on 05/01/20.  He has a history of newly diagnosed diffuse large B-cell lymphoma and presents today for Port-A-Cath placement for additional chemotherapy.  He currently denies fever, headache, chest pain, cough, abdominal/back pain, nausea, vomiting or bleeding.  He does have some dyspnea.  Additional history as below.  Past Medical History:  Diagnosis Date  . Cancer ([place])   . Goals of care, counseling/discussion 05/06/2020  . Hypertension   . Knee pain, right    Past Surgical History:  Procedure Laterality Date  . FOOT SURGERY    . TONSILLECTOMY        Allergies: Patient has no known allergies.  Medications: Prior to Admission medications   Medication Sig Start Date End Date Taking? Authorizing Provider  Ascorbic Acid (VITAMIN C WITH ROSE HIPS) 1000 MG tablet Take 1,000 mg by mouth daily.   Yes [provider]  aspirin EC 81 MG tablet Take 81 mg by mouth daily.   Yes [provider]  clobetasol ointment (TEMOVATE) 8.24 % Apply 1 application topically daily as needed (eczema).    Yes [provider]  diphenhydrAMINE (BENADRYL) 25 mg capsule Take 1 capsule (25 mg total) by mouth at bedtime as needed for sleep. 05/07/20  Yes Curcio, Roselie Awkward, NP  docusate sodium (COLACE) 100 MG capsule Take 200 mg by mouth 2 (two) times daily as needed for mild constipation.  04/14/20  Yes [provider]  doxazosin (CARDURA) 4 MG tablet Take 4 mg by mouth daily.   Yes [provider]  magic mouthwash w/lidocaine SOLN Take 15 mLs by mouth 4 (four) times daily as needed for mouth pain. 05/15/20  Yes Pokhrel, Laxman, MD  melatonin 3 MG TABS tablet Take 2 tablets (6 mg total) by mouth at bedtime as needed  (sleep). 05/15/20  Yes Pokhrel, Laxman, MD  metoprolol tartrate (LOPRESSOR) 25 MG tablet Take 0.5 tablets (12.5 mg total) by mouth 2 (two) times daily. 06/09/18  Yes Lajean Saver, MD  Multiple Vitamins-Minerals (MULTIVITAMIN PO) Take 1 tablet by mouth daily.   Yes [provider]  Nutritional Supplements (BOOST MAX PROTEIN PO) Take 237 mLs by mouth in the morning and at bedtime. Chocolate   Yes [provider]  Omega-3 Fatty Acids (OMEGA 3 PO) Take 1 tablet by mouth daily.   Yes [provider]  senna-docusate (SENOKOT-S) 8.6-50 MG tablet Take 2 tablets by mouth 2 (two) times daily as needed for mild constipation. 05/07/20  Yes Curcio, Roselie Awkward, NP  allopurinol (ZYLOPRIM) 100 MG tablet Take 1 tablet (100 mg total) by mouth daily. 04/26/20 05/26/20  [place] Eric J, DO  HYDROcodone-acetaminophen (NORCO/VICODIN) 5-325 MG tablet Take 1 tablet by mouth every 6 (six) hours as needed for moderate pain. Patient taking differently: Take 0.5-1 tablets by mouth every 6 (six) hours as needed for moderate pain.  05/07/20   Maryanna Shape, NP  menthol-cetylpyridinium (CEPACOL) 3 MG lozenge Take 1 lozenge (3 mg total) by mouth as needed for sore throat. 05/15/20   Pokhrel, Corrie Mckusick, MD  ondansetron (ZOFRAN) 8 MG tablet Take 1 tablet (8 mg total) by mouth every 8 (eight) hours as needed for nausea or vomiting. 05/07/20   Maryanna Shape, NP  pantoprazole (PROTONIX) 40 MG tablet Take 1  tablet (40 mg total) by mouth daily. 04/26/20 04/26/21  [place] Donnamarie Poag, DO  polyethylene glycol powder (GLYCOLAX/MIRALAX) 17 GM/SCOOP powder Take 17 g by mouth daily as needed for mild constipation.  04/14/20   [provider]  prochlorperazine (COMPAZINE) 10 MG tablet Take 1 tablet (10 mg total) by mouth every 6 (six) hours as needed for nausea or vomiting. 05/07/20   Maryanna Shape, NP  simethicone (MYLICON) 80 MG chewable tablet Chew 1 tablet (80 mg total) by mouth 4 (four) times daily as  needed for flatulence. 05/07/20   Maryanna Shape, NP  Sodium Chloride-Sodium Bicarb (SODIUM BICARBONATE/SODIUM CHLORIDE) SOLN 1 application by Mouth Rinse route as needed for dry mouth or mouth pain. 05/07/20   Maryanna Shape, NP  triamcinolone cream (KENALOG) 0.1 % Apply 1 application topically 3 (three) times daily as needed for rash. 04/02/20   [provider]     Vital Signs:pend Ht 5\' 11"  (1.803 m)   Wt 198 lb (89.8 kg)   BMI 27.62 kg/m   Physical Exam awake, alert.  Chest clear to auscultation bilaterally.  Heart with regular rate and rhythm.  Abdomen soft, positive bowel sounds, nontender.  No lower extremity edema.  Imaging: No results found.  Labs:  CBC: Recent Labs    05/15/20 0646 05/18/20 1158 05/24/20 1442 05/28/20 0800  WBC 7.8 18.0* 10.9* 11.2*  HGB 8.2* 8.4* 9.2* 11.0*  HCT 23.9* 25.4* 27.1* 33.3*  PLT 92* 182 270 342    COAGS: Recent Labs    04/22/20 0210 05/12/20 2044 05/13/20 0411 05/28/20 0800  INR 1.3* 1.6* 1.7* 1.2  APTT  --  34  --   --     BMP: Recent Labs    05/14/20 0549 05/15/20 0646 05/18/20 1158 05/24/20 1442  NA 132* 133* 136 138  K 3.4* 3.3* 4.0 4.3  CL 100 101 104 107  CO2 21* 21* 25 26  GLUCOSE 94 94 114* 116*  BUN 17 14 10 9   CALCIUM 8.3* 8.1* 8.4* 8.4*  CREATININE 1.14 1.08 1.08 0.97  GFRNONAA >60 >60 >60 >60    LIVER FUNCTION TESTS: Recent Labs    05/14/20 0549 05/15/20 0646 05/18/20 1158 05/24/20 1442  BILITOT 1.3* 1.2 0.8 0.8  AST 30 35 42* 68*  ALT 68* 62* 53* 98*  ALKPHOS 146* 173* 248* 218*  PROT 5.1* 5.0* 5.2* 6.0*  ALBUMIN 2.8* 2.7* 2.6* 3.0*    Assessment and Plan: Pt familiar to IR service from right liver lesion biopsy on 04/23/20 and PICC placement on 05/01/20.  He has a history of newly diagnosed diffuse large B-cell lymphoma and presents today for Port-A-Cath placement for additional chemotherapy. Risks and benefits of image guided port-a-catheter placement was discussed with  the patient including, but not limited to bleeding, infection, pneumothorax, or fibrin sheath development and need for additional procedures.  All of the patient's questions were answered, patient is agreeable to proceed. Consent signed and in chart.  Post procedure patient will be admitted to the oncology service for treatment   Electronically Signed: D. Rowe Robert, PA-C 05/28/2020, 8:52 AM   I spent a total of 25 minutes at the the patient's bedside AND on the patient's hospital floor or unit, greater than 50% of which was counseling/coordinating care for Port-A-Cath placement

## 2020-05-28 NOTE — Discharge Instructions (Signed)
For questions /concerns may call Interventional Radiology at [phone]  You may remove your dressing and shower tomorrow afternoon  DO NOT use EMLA cream for 2 weeks after port placement as the cream will remove surgical glue on your incision.  For questions /concerns may call Interventional Radiology at [phone]  You may remove your dressing and shower tomorrow afternoon  DO NOT use EMLA cream for 2 weeks after port placement as the cream will remove surgical glue on your incision.     Implanted Port Insertion, Care After This sheet gives you information about how to care for yourself after your procedure. Your health care provider may also give you more specific instructions. If you have problems or questions, contact your health care provider. What can I expect after the procedure? After the procedure, it is common to have:  Discomfort at the port insertion site.  Bruising on the skin over the port. This should improve over 3-4 days. Follow these instructions at home: [facility] care  After your port is placed, you will get a manufacturer's information card. The card has information about your port. Keep this card with you at all times.  Take care of the port as told by your health care provider. Ask your health care provider if you or a family member can get training for taking care of the port at home. A home health care nurse may also take care of the port.  Make sure to remember what type of port you have. Incision care      Follow instructions from your health care provider about how to take care of your port insertion site. Make sure you: ? Wash your hands with soap and water before and after you change your bandage (dressing). If soap and water are not available, use hand sanitizer. ? Change your dressing as told by your health care provider. ? Leave stitches (sutures), skin glue, or adhesive strips in place. These skin closures may need to stay in place for 2 weeks or  longer. If adhesive strip edges start to loosen and curl up, you may trim the loose edges. Do not remove adhesive strips completely unless your health care provider tells you to do that.  Check your port insertion site every day for signs of infection. Check for: ? Redness, swelling, or pain. ? Fluid or blood. ? Warmth. ? Pus or a bad smell. Activity  Return to your normal activities as told by your health care provider. Ask your health care provider what activities are safe for you.  Do not lift anything that is heavier than 10 lb (4.5 kg), or the limit that you are told, until your health care provider says that it is safe. General instructions  Take over-the-counter and prescription medicines only as told by your health care provider.  Do not take baths, swim, or use a hot tub until your health care provider approves. Ask your health care provider if you may take showers. You may only be allowed to take sponge baths.  Do not drive for 24 hours if you were given a sedative during your procedure.  Wear a medical alert bracelet in case of an emergency. This will tell any health care providers that you have a port.  Keep all follow-up visits as told by your health care provider. This is important. Contact a health care provider if:  You cannot flush your port with saline as directed, or you cannot draw blood from the port.  You have a fever  or chills.  You have redness, swelling, or pain around your port insertion site.  You have fluid or blood coming from your port insertion site.  Your port insertion site feels warm to the touch.  You have pus or a bad smell coming from the port insertion site. Get help right away if:  You have chest pain or shortness of breath.  You have bleeding from your port that you cannot control. Summary  Take care of the port as told by your health care provider. Keep the manufacturer's information card with you at all times.  Change your  dressing as told by your health care provider.  Contact a health care provider if you have a fever or chills or if you have redness, swelling, or pain around your port insertion site.  Keep all follow-up visits as told by your health care provider. This information is not intended to replace advice given to you by your health care provider. Make sure you discuss any questions you have with your health care provider. Document Revised: 12/22/2017 Document Reviewed: 12/22/2017 Elsevier Patient Education  [employer]. Moderate Conscious Sedation, Adult, Care After These instructions provide you with information about caring for yourself after your procedure. Your health care provider may also give you more specific instructions. Your treatment has been planned according to current medical practices, but problems sometimes occur. Call your health care provider if you have any problems or questions after your procedure. What can I expect after the procedure? After your procedure, it is common:  To feel sleepy for several hours.  To feel clumsy and have poor balance for several hours.  To have poor judgment for several hours.  To vomit if you eat too soon. Follow these instructions at home: For at least 24 hours after the procedure:   Do not: ? Participate in activities where you could fall or become injured. ? Drive. ? Use heavy machinery. ? Drink alcohol. ? Take sleeping pills or medicines that cause drowsiness. ? Make important decisions or sign legal documents. ? Take care of children on your own.  Rest. Eating and drinking  Follow the diet recommended by your health care provider.  If you vomit: ? Drink water, juice, or soup when you can drink without vomiting. ? Make sure you have little or no nausea before eating solid foods. General instructions  Have a responsible adult stay with you until you are awake and alert.  Take over-the-counter and prescription medicines  only as told by your health care provider.  If you smoke, do not smoke without supervision.  Keep all follow-up visits as told by your health care provider. This is important. Contact a health care provider if:  You keep feeling nauseous or you keep vomiting.  You feel light-headed.  You develop a rash.  You have a fever. Get help right away if:  You have trouble breathing. This information is not intended to replace advice given to you by your health care provider. Make sure you discuss any questions you have with your health care provider. Document Revised: 05/08/2017 Document Reviewed: 09/15/2015 Elsevier Patient Education  2020 [employer].

## 2020-05-28 NOTE — Progress Notes (Signed)
Chemotherapy dosing checked independently with Adela Ports, RN.  Dosing of Doxorubicin, Etoposide, and Vincristine checked based on patient's BSA and normal dosing of the medications.

## 2020-05-28 NOTE — Procedures (Signed)
Pre Procedure Dx: Poor venous access Post Procedural Dx: Same  Successful placement of right IJ approach port-a-cath with tip at the superior caval atrial junction. The catheter is ready for immediate use.  Estimated Blood Loss: Minimal  Complications: None immediate.  Jay Casondra Gasca, MD Pager #: [phone]   

## 2020-05-29 LAB — COMPREHENSIVE METABOLIC PANEL
ALT: 68 U/L — ABNORMAL HIGH (ref 0–44)
AST: 41 U/L (ref 15–41)
Albumin: 3.1 g/dL — ABNORMAL LOW (ref 3.5–5.0)
Alkaline Phosphatase: 160 U/L — ABNORMAL HIGH (ref 38–126)
Anion gap: 8 (ref 5–15)
BUN: 19 mg/dL (ref 8–23)
CO2: 21 mmol/L — ABNORMAL LOW (ref 22–32)
Calcium: 8.3 mg/dL — ABNORMAL LOW (ref 8.9–10.3)
Chloride: 106 mmol/L (ref 98–111)
Creatinine, Ser: 0.82 mg/dL (ref 0.61–1.24)
GFR, Estimated: >60 mL/min (ref >60)
Glucose, Bld: 109 mg/dL — ABNORMAL HIGH (ref 70–99)
Potassium: 3.9 mmol/L (ref 3.5–5.1)
Sodium: 135 mmol/L (ref 135–145)
Total Bilirubin: 0.7 mg/dL (ref 0.3–1.2)
Total Protein: 5.8 g/dL — ABNORMAL LOW (ref 6.5–8.1)

## 2020-05-29 LAB — CBC WITH DIFFERENTIAL/PLATELET
Abs Immature Granulocytes: 0.2 10*3/uL — ABNORMAL HIGH (ref 0.00–0.07)
Basophils Absolute: 0.1 10*3/uL (ref 0.0–0.1)
Basophils Relative: 1 %
Eosinophils Absolute: 0 10*3/uL (ref 0.0–0.5)
Eosinophils Relative: 0 %
HCT: 26.8 % — ABNORMAL LOW (ref 39.0–52.0)
Hemoglobin: 9 g/dL — ABNORMAL LOW (ref 13.0–17.0)
Immature Granulocytes: 2 %
Lymphocytes Relative: 2 %
Lymphs Abs: 0.3 10*3/uL — ABNORMAL LOW (ref 0.7–4.0)
MCH: 29.8 pg (ref 26.0–34.0)
MCHC: 33.6 g/dL (ref 30.0–36.0)
MCV: 88.7 fL (ref 80.0–100.0)
Monocytes Absolute: 1.3 10*3/uL — ABNORMAL HIGH (ref 0.1–1.0)
Monocytes Relative: 10 %
Neutro Abs: 11 10*3/uL — ABNORMAL HIGH (ref 1.7–7.7)
Neutrophils Relative %: 85 %
Platelets: 273 10*3/uL (ref 150–400)
RBC: 3.02 MIL/uL — ABNORMAL LOW (ref 4.22–5.81)
RDW: 16.6 % — ABNORMAL HIGH (ref 11.5–15.5)
WBC: 12.9 10*3/uL — ABNORMAL HIGH (ref 4.0–10.5)
nRBC: 0 % (ref 0.0–0.2)

## 2020-05-29 LAB — URIC ACID: Uric Acid, Serum: 4.1 mg/dL (ref 3.7–8.6)

## 2020-05-29 LAB — LACTATE DEHYDROGENASE: LDH: 237 U/L — ABNORMAL HIGH (ref 98–192)

## 2020-05-29 MED ORDER — SODIUM CHLORIDE 0.9 % IV SOLN
10.0000 mg | Freq: Once | INTRAVENOUS | Status: AC
  Administered 2020-05-30: 10 mg via INTRAVENOUS
  Filled 2020-05-29: qty 1

## 2020-05-29 MED ORDER — VINCRISTINE SULFATE CHEMO INJECTION 1 MG/ML
Freq: Once | INTRAVENOUS | Status: AC
  Filled 2020-05-29: qty 9

## 2020-05-29 MED ORDER — SODIUM CHLORIDE 0.9 % IV SOLN
10.0000 mg | Freq: Once | INTRAVENOUS | Status: AC
  Administered 2020-05-29: 10 mg via INTRAVENOUS
  Filled 2020-05-29: qty 1

## 2020-05-29 MED ORDER — ONDANSETRON HCL 8 MG PO TABS
8.0000 mg | ORAL_TABLET | Freq: Once | ORAL | Status: AC
  Administered 2020-05-30: 8 mg via ORAL
  Filled 2020-05-29: qty 1

## 2020-05-29 MED ORDER — LACTULOSE 10 GM/15ML PO SOLN: 30.0000 g | Freq: Two times a day (BID) | ORAL | Status: DC | PRN

## 2020-05-29 NOTE — Progress Notes (Addendum)
[name]   DOB:April 23, 1946   TM#:[id number]   FKC#:[id number]  I have seen and examined him Oncology prognosis note   Subjective: Tolerated day 1 of cycle #2 of his chemotherapy well overall. Denies mucositis, nausea, vomiting. Denies chest pain or shortness of breath. Bowels have not moved yet. No bleeding reported.  Objective:  Vitals:   05/28/20 2250 05/29/20 0633  BP: 115/71 113/75  Pulse: 88 79  Resp: 16 16  Temp: 98.2 F (36.8 C) 97.7 F (36.5 C)  SpO2: 97% 98%    Body mass index is 27.62 kg/m.  Intake/Output Summary (Last 24 hours) at 05/29/2020 0820 Last data filed at 05/29/2020 0221 Gross per 24 hour  Intake 1204.53 ml  Output --  Net 1204.53 ml     Sclerae unicteric  Oropharynx clear  No peripheral adenopathy  Lungs clear scheduler rales in b/l lung bases   Heart regular rate and rhythm  Abdomen benign  MSK no focal spinal tenderness, no lower extremity edema  Neuro nonfocal Port site looks ok    CBG (last 3)  No results for input(s): GLUCAP in the last 72 hours.   Labs:  Urine Studies No results for input(s): UHGB, CRYS in the last 72 hours.  Invalid input(s): UACOL, UAPR, USPG, UPH, UTP, UGL, UKET, UBIL, UNIT, UROB, ULEU, UEPI, UWBC, URBC, UBAC, CAST, UCOM, BILUA  Basic Metabolic Panel: Recent Labs  Lab 05/24/20 1442 05/29/20 0600  NA 138 135  K 4.3 3.9  CL 107 106  CO2 26 21*  GLUCOSE 116* 109*  BUN 9 19  CREATININE 0.97 0.82  CALCIUM 8.4* 8.3*   GFR Estimated Creatinine Clearance: 86.7 mL/min (by C-G formula based on SCr of 0.82 mg/dL). Liver Function Tests: Recent Labs  Lab 05/24/20 1442 05/29/20 0600  AST 68* 41  ALT 98* 68*  ALKPHOS 218* 160*  BILITOT 0.8 0.7  PROT 6.0* 5.8*  ALBUMIN 3.0* 3.1*   No results for input(s): LIPASE, AMYLASE in the last 168 hours. No results for input(s): AMMONIA in the last 168 hours. Coagulation profile Recent Labs  Lab 05/28/20 0800  INR 1.2    CBC: Recent Labs  Lab  05/24/20 1442 05/28/20 0800 05/29/20 0600  WBC 10.9* 11.2* 12.9*  NEUTROABS 8.4* 8.6* 11.0*  HGB 9.2* 11.0* 9.0*  HCT 27.1* 33.3* 26.8*  MCV 87.7 89.3 88.7  PLT 270 342 273   Cardiac Enzymes: No results for input(s): CKTOTAL, CKMB, CKMBINDEX, TROPONINI in the last 168 hours. BNP: Invalid input(s): POCBNP CBG: No results for input(s): GLUCAP in the last 168 hours. D-Dimer No results for input(s): DDIMER in the last 72 hours. Hgb A1c No results for input(s): HGBA1C in the last 72 hours. Lipid Profile No results for input(s): CHOL, HDL, LDLCALC, TRIG, CHOLHDL, LDLDIRECT in the last 72 hours. Thyroid function studies No results for input(s): TSH, T4TOTAL, T3FREE, THYROIDAB in the last 72 hours.  Invalid input(s): FREET3 Anemia work up No results for input(s): VITAMINB12, FOLATE, FERRITIN, TIBC, IRON, RETICCTPCT in the last 72 hours. Microbiology Recent Results (from the past 240 hour(s))  SARS CORONAVIRUS 2 (TAT 6-24 HRS) Nasopharyngeal Nasopharyngeal Swab     Status: None   Collection Time: 05/26/20  2:54 PM   Specimen: Nasopharyngeal Swab  Result Value Ref Range Status   SARS Coronavirus 2 NEGATIVE NEGATIVE Final    Comment: (NOTE) SARS-CoV-2 target nucleic acids are NOT DETECTED.  The SARS-CoV-2 RNA is generally detectable in upper and lower respiratory specimens during the acute phase of  infection. Negative results do not preclude SARS-CoV-2 infection, do not rule out co-infections with other pathogens, and should not be used as the sole basis for treatment or other patient management decisions. Negative results must be combined with clinical observations, patient history, and epidemiological information. The expected result is Negative.  Fact Sheet for Patients: SugarRoll.be  Fact Sheet for Healthcare Providers: https://www.woods-mathews.com/  This test is not yet approved or cleared by the [place] FDA and  has been  authorized for detection and/or diagnosis of SARS-CoV-2 by FDA under an Emergency Use Authorization (EUA). This EUA will remain  in effect (meaning this test can be used) for the duration of the COVID-19 declaration under Se ction 564(b)(1) of the Act, 21 U.S.C. section 360bbb-3(b)(1), unless the authorization is terminated or revoked sooner.  Performed at [facility], [place] [address] [place], [place] [address]       Studies:  IR IMAGING GUIDED PORT INSERTION  Result Date: 05/28/2020 INDICATION: Recent diagnosis of large B-cell lymphoma. In need of durable intravenous access for chemotherapy administration. EXAM: IMPLANTED PORT A CATH PLACEMENT WITH ULTRASOUND AND FLUOROSCOPIC GUIDANCE COMPARISON:  CT the chest, abdomen and pelvis-04/20/2020 MEDICATIONS: Ancef 2 gm IV; The antibiotic was administered within an appropriate time interval prior to skin puncture. ANESTHESIA/SEDATION: Moderate (conscious) sedation was employed during this procedure. A total of Versed 4 mg and Fentanyl 100 mcg was administered intravenously. Moderate Sedation Time: 27 minutes. The patient's level of consciousness and vital signs were monitored continuously by radiology nursing throughout the procedure under my direct supervision. CONTRAST:  None FLUOROSCOPY TIME:  18 seconds (8 mGy) COMPLICATIONS: None immediate. PROCEDURE: The procedure, risks, benefits, and alternatives were explained to the patient. Questions regarding the procedure were encouraged and answered. The patient understands and consents to the procedure. The right neck and chest were prepped with chlorhexidine in a sterile fashion, and a sterile drape was applied covering the operative field. Maximum barrier sterile technique with sterile gowns and gloves were used for the procedure. A timeout was performed prior to the initiation of the procedure. Local anesthesia was provided with 1% lidocaine with epinephrine. After creating a small venotomy  incision, a micropuncture kit was utilized to access the internal jugular vein. Real-time ultrasound guidance was utilized for vascular access including the acquisition of a permanent ultrasound image documenting patency of the accessed vessel. The microwire was utilized to measure appropriate catheter length. A subcutaneous port pocket was then created along the upper chest wall utilizing a combination of sharp and blunt dissection. The pocket was irrigated with sterile saline. A single lumen "Slim" sized power injectable port was chosen for placement. The 8 Fr catheter was tunneled from the port pocket site to the venotomy incision. The port was placed in the pocket. The external catheter was trimmed to appropriate length. At the venotomy, an 8 Fr peel-away sheath was placed over a guidewire under fluoroscopic guidance. The catheter was then placed through the sheath and the sheath was removed. Final catheter positioning was confirmed and documented with a fluoroscopic spot radiograph. The port was accessed with a Huber needle, aspirated and flushed with heparinized saline. The venotomy site was closed with an interrupted 4-0 Vicryl suture. The port pocket incision was closed with interrupted 2-0 Vicryl suture. The skin was opposed with a running subcuticular 4-0 Vicryl suture. Dermabond and Steri-strips were applied to both incisions. Dressings were applied. The patient tolerated the procedure well without immediate post procedural complication. FINDINGS: After catheter placement, the tip  lies within the superior cavoatrial junction. The catheter aspirates and flushes normally and is ready for immediate use. The port a Catheter was left accessed for the patient's impending hospitalization and initiation of chemotherapy. IMPRESSION: Successful placement of a right internal jugular approach power injectable Port-A-Cath. The catheter is ready for immediate use. Electronically Signed   By: Sandi Mariscal M.D.   On:  05/28/2020 10:55    Assessment:  John [name] is a 74 y.o. male with   1.  Diffuse large B-cell lymphoma, ABC subtype, stage IV 2. Fatigue secondary to #1 3. Anemia secondary to chemotherapy 4. Leukocytosis due to recent Neulasta 5. Transaminitis, improved  Plan:  -Tolerated day 1 of cycle 2 of chemotherapy well overall. Chemotherapy has been dose reduced by 20% starting with cycle 2 due to cytopenias and poor tolerance with cycle #1. -Labs from today have been reviewed and are adequate for treatment. We will monitor CBC with differential, CMET, LDH, uric acid daily. -He is at high risk for CNS involvement due to high risk features and will plan to begin intrathecal methotrexate with this cycle. Order has been entered and I confirmed with him that they are aware of this procedure. They plan to bring him to radiology tomorrow for intrathecal methotrexate. I have made him n.p.o. after midnight. -We will continue to hold aspirin and Lovenox in anticipation of intrathecal methotrexate. SCDs for DVT prophylaxis. -Continue current bowel regimen including MiraLAX daily and Senokot S2 tablets twice a day. He has Colace ordered as needed. I have also ordered lactulose 30 g twice daily as needed for severe constipation. This has worked well for him in the past. -He has as needed antiemetics available for nausea and vomiting. -Continue allopurinol and other home medications. -He has sodium bicarbonate mouth rinses and Magic mouthwash as needed ordered. -Follow-up as scheduled on 06/04/2020 for labs, visit, Rituxan, Neulasta.  Mikey Bussing, NP 05/29/2020  8:20 AM  Heath Lark, MD

## 2020-05-29 NOTE — Progress Notes (Signed)
Pt sched w/ IR for IT MTX on 12/22 at 11 am. Pt will be premedicated for chemo at 10 am on 12/22.  Will not need additional premeds for IT MTX per Dr. Alvy Bimler.  Kennith Center, Pharm.D., CPP 05/29/2020@12 :01 PM

## 2020-05-30 ENCOUNTER — Inpatient Hospital Stay (HOSPITAL_COMMUNITY): Payer: Medicare HMO

## 2020-05-30 LAB — CBC WITH DIFFERENTIAL/PLATELET
Abs Immature Granulocytes: 0.16 10*3/uL — ABNORMAL HIGH (ref 0.00–0.07)
Basophils Absolute: 0.1 10*3/uL (ref 0.0–0.1)
Basophils Relative: 1 %
Eosinophils Absolute: 0.1 10*3/uL (ref 0.0–0.5)
Eosinophils Relative: 1 %
HCT: 26.5 % — ABNORMAL LOW (ref 39.0–52.0)
Hemoglobin: 8.9 g/dL — ABNORMAL LOW (ref 13.0–17.0)
Immature Granulocytes: 2 %
Lymphocytes Relative: 3 %
Lymphs Abs: 0.3 10*3/uL — ABNORMAL LOW (ref 0.7–4.0)
MCH: 30.2 pg (ref 26.0–34.0)
MCHC: 33.6 g/dL (ref 30.0–36.0)
MCV: 89.8 fL (ref 80.0–100.0)
Monocytes Absolute: 1.1 10*3/uL — ABNORMAL HIGH (ref 0.1–1.0)
Monocytes Relative: 11 %
Neutro Abs: 8.8 10*3/uL — ABNORMAL HIGH (ref 1.7–7.7)
Neutrophils Relative %: 82 %
Platelets: 245 10*3/uL (ref 150–400)
RBC: 2.95 MIL/uL — ABNORMAL LOW (ref 4.22–5.81)
RDW: 16.6 % — ABNORMAL HIGH (ref 11.5–15.5)
WBC: 10.6 10*3/uL — ABNORMAL HIGH (ref 4.0–10.5)
nRBC: 0 % (ref 0.0–0.2)

## 2020-05-30 LAB — COMPREHENSIVE METABOLIC PANEL
ALT: 66 U/L — ABNORMAL HIGH (ref 0–44)
AST: 44 U/L — ABNORMAL HIGH (ref 15–41)
Albumin: 3.1 g/dL — ABNORMAL LOW (ref 3.5–5.0)
Alkaline Phosphatase: 144 U/L — ABNORMAL HIGH (ref 38–126)
Anion gap: 10 (ref 5–15)
BUN: 24 mg/dL — ABNORMAL HIGH (ref 8–23)
CO2: 21 mmol/L — ABNORMAL LOW (ref 22–32)
Calcium: 8.5 mg/dL — ABNORMAL LOW (ref 8.9–10.3)
Chloride: 106 mmol/L (ref 98–111)
Creatinine, Ser: 0.81 mg/dL (ref 0.61–1.24)
GFR, Estimated: >60 mL/min (ref >60)
Glucose, Bld: 100 mg/dL — ABNORMAL HIGH (ref 70–99)
Potassium: 3.7 mmol/L (ref 3.5–5.1)
Sodium: 137 mmol/L (ref 135–145)
Total Bilirubin: 0.6 mg/dL (ref 0.3–1.2)
Total Protein: 5.4 g/dL — ABNORMAL LOW (ref 6.5–8.1)

## 2020-05-30 LAB — LACTATE DEHYDROGENASE: LDH: 231 U/L — ABNORMAL HIGH (ref 98–192)

## 2020-05-30 LAB — URIC ACID: Uric Acid, Serum: 4.3 mg/dL (ref 3.7–8.6)

## 2020-05-30 MED ORDER — VINCRISTINE SULFATE CHEMO INJECTION 1 MG/ML
Freq: Once | INTRAVENOUS | Status: AC
  Filled 2020-05-30: qty 9

## 2020-05-30 MED ORDER — SODIUM CHLORIDE 0.9 % IV SOLN
10.0000 mg | Freq: Once | INTRAVENOUS | Status: AC
  Administered 2020-05-31: 10 mg via INTRAVENOUS
  Filled 2020-05-30: qty 1

## 2020-05-30 MED ORDER — SODIUM CHLORIDE (PF) 0.9 % IJ SOLN
Freq: Once | INTRAMUSCULAR | Status: AC
  Filled 2020-05-30: qty 0.48

## 2020-05-30 NOTE — Progress Notes (Signed)
Patient ID: John [name], male   DOB: [birth year], 74 y.o.   MRN: [id number] CLINICAL DATA: [Intrathecal methotrexate injection for lymphoma.] EXAM: FLUOROSCOPICALLY GUIDED LUMBAR PUNCTURE FOR INTRATHECAL CHEMOTHERAPY FLUOROSCOPY TIME: [1 minutes 0 seconds.] PROCEDURE: Informed consent was obtained from the patient prior to the procedure, including potential complications of headache, allergy, and pain. With the patient prone, the lower back was prepped with Betadine. 1% Lidocaine was used for local anesthesia. Lumbar puncture was performed at the [L3-4] level using a gauge needle with return of [initially blood tinged but then clear]CSF. Cato.[place] mg] of [methotrexate] was injected into the subarachnoid space. The patient tolerated the procedure well without apparent complication. IMPRESSION: [Successful intrathecal methotrexate injection under fluoroscopy.]

## 2020-05-30 NOTE — Progress Notes (Signed)
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 The patient knows to call the clinic with any problems, questions or concerns.   John Lark, MD 05/30/2020 11:54 AM  Subjective:  He feels well He had bowel movement this morning No nausea Tolerated chemotherapy well so far  Objective:  Vitals:   05/29/20 2314 05/30/20 0653  BP: 129/66 130/67  Pulse: 82 80  Resp:  18  Temp:  97.8 F (36.6 C)  SpO2:  100%     Intake/Output Summary (Last 24 hours) at 05/30/2020 1154 Last data filed at 05/30/2020 0300 Gross per 24 hour  Intake 765 ml  Output --  Net 765 ml    GENERAL:alert, no distress and comfortable SKIN: skin color, texture, turgor are normal, no rashes or significant lesions EYES: normal, Conjunctiva are pink and non-injected, sclera clear OROPHARYNX:no exudate, no erythema and lips, buccal mucosa, and tongue normal  NECK: supple, thyroid normal size, non-tender, without nodularity LYMPH:  no palpable lymphadenopathy in the cervical, axillary or inguinal LUNGS: clear to auscultation and percussion with normal breathing effort HEART: regular rate & rhythm and no murmurs and no lower extremity edema ABDOMEN:abdomen soft, non-tender and normal bowel sounds Musculoskeletal:no cyanosis of digits and  no clubbing  NEURO: alert & oriented x 3 with fluent speech, no focal motor/sensory deficits   Labs:  Recent Labs    05/07/20 0446 05/10/20 0956 05/24/20 1442 05/29/20 0600 05/30/20 0515  NA 132*   < > 138 135 137  K 3.5   < > 4.3 3.9 3.7  CL 100   < > 107 106 106  CO2 25   < > 26 21* 21*  GLUCOSE 100*   < > 116* 109* 100*  BUN 36*   < > 9 19 24*  CREATININE 1.37*   < > 0.97 0.82 0.81  CALCIUM 8.8*   < > 8.4* 8.3* 8.5*  GFRNONAA 55*   < > >60 >60 >60  PROT 4.6*  4.7*   < > 6.0* 5.8* 5.4*  ALBUMIN 2.7*  2.7*   < > 3.0* 3.1* 3.1*  AST 327*  324*   < > 68* 41 44*  ALT 689*  703*   < > 98* 68* 66*  ALKPHOS 170*  172*   < > 218* 160* 144*  BILITOT 1.7*  2.1*   < > 0.8 0.7 0.6  BILIDIR 0.7*  --   --   --   --   IBILI 1.0*  --   --   --   --    < > = values in this interval not displayed.    Studies:  CT ABDOMEN PELVIS W CONTRAST  Result Date: 05/06/2020 CLINICAL DATA:  Abdominal pain. Increased liver function tests. Large B-cell lymphoma. Recent liver biopsy with hemoperitoneum. EXAM: CT ABDOMEN AND PELVIS WITH CONTRAST TECHNIQUE: Multidetector  CT imaging of the abdomen and pelvis was performed using the standard protocol following bolus administration of intravenous contrast. CONTRAST:  65m OMNIPAQUE IOHEXOL 300 MG/ML  SOLN COMPARISON:  04/25/2020 FINDINGS: Lower chest: Small left and trace right pleural effusions. Descending thoracic aortic atherosclerosis. Mild mitral valve calcification. Hepatobiliary: Dominant right hepatic lobe lesion 10.8 by 10.4 cm on image 16 of series 2, formerly about 9.1 cm based on my measurement. Mixed internal density favoring internal hematoma. There is also increased prominence of a lobulation along the inferior border possibly also representing hematoma or additional lesion measuring 6.0 by 5.0 cm on image 24 series 2. Dependent density in the gallbladder favoring sludge, blood products, or hematoma. No biliary dilatation. Pancreas: Mesenteric  mass mass along the inferior margin of the head of the pancreas measuring 4.5 by 3.7 cm on image 36 of series 2, roughly stable from prior. Spleen: Multiple masses in the spleen are present, little larger lesions measures 5.0 by 3.9 cm on image 19 of series 2. These were present previously although much less conspicuous due to the lack of IV contrast. Adrenals/Urinary Tract: Small nodule along the inferior margin of the right adrenal gland measuring 1.0 by 1.3 cm, probably an adenoma based on prior imaging but technically nonspecific on today's exam. Hypodense 2.2 by 2.4 cm lesion in the left mid upper kidney with fluid density, favoring cyst. Urinary bladder unremarkable. Stomach/Bowel: Air-levels in the distal colon favoring diarrheal process. Sigmoid colon diverticulosis. There is stranding along the sigmoid colon such that low-grade diverticulitis cannot be readily excluded. Vascular/Lymphatic: Aortoiliac atherosclerotic vascular disease. Scattered pathologic adenopathy. Adjacent to the pancreatic tail and splenic hilum, there are clustered abnormal lymph nodes with index node measuring 1.8 cm in short axis on image 23 of series 2, formerly 1.6 cm. Portacaval node 1.8 cm in short axis on image 28 series 2, formerly 2.1 cm. Left periaortic node 1.5 cm in short axis on image 36 series 2, formerly 1.7 cm. Left external iliac node 1.4 cm in short axis on image 60 of series 2, formerly the same. Left common iliac node 1.3 cm in short axis on image 51 series 2, formerly 1.8 cm. Other additional enlarged lymph nodes are present, the generally the lymph nodes are mildly reduced in size compared to the prior exam. Reproductive: Prostatomegaly Other: Complex ascites mildly reduced from the prior exam compatible with hemoperitoneum. Musculoskeletal: Bridging spurring of the sacroiliac joints. 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 Mesenteric and subcutaneous edema compatible with third spacing of fluid. IMPRESSION: 1. Mild improvement in the adenopathy in the abdomen and pelvis. 2. Mild reduction in size of the complex ascites compatible with hemoperitoneum. 3. Large bilobed mass in the liver with internal heterogeneous high density probably from internal hematoma, difficult to compare in size to the prior exam due to low conspicuity on the prior exam. 4. Stable appearance of the mesenteric mass along the inferior margin of the head of the pancreas. 5. Multiple splenic masses, some of which are similar to prior, difficult to compare in size due to low conspicuity on prior noncontrast exam. 6. Air-levels in the distal colon favoring diarrheal process. 7. Sigmoid colon diverticulosis. There is stranding along the sigmoid colon such that low-grade diverticulitis cannot be readily excluded. 8. Small left and trace right pleural effusions. 9. Prostatomegaly. 10. Multilevel lumbar impingement. 11. Small right adrenal nodule, probably an adenoma  based on prior imaging but technically nonspecific on today's exam. 12. 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 13. Mesenteric and subcutaneous edema compatible with third spacing of fluid. 14. Aortic atherosclerosis. Aortic Atherosclerosis (ICD10-I70.0). Electronically Signed   By: Van Clines M.D.   On: 05/06/2020 13:56   DG Chest Port 1 View  Result Date: 05/12/2020 CLINICAL DATA:  Sepsis EXAM: PORTABLE CHEST 1 VIEW COMPARISON:  April 20, 2020 FINDINGS: There is a subtle right infrahilar airspace opacity. No pneumothorax. No large pleural effusion. The heart size is stable. Aortic calcifications are noted. The previously demonstrated adenopathy in the upper mediastinum is improved. IMPRESSION: 1. Subtle right  infrahilar airspace opacity may represent atelectasis or infiltrate. 2. Improved mediastinal adenopathy. 3. No pneumothorax or large pleural effusion. Electronically Signed   By: Constance Holster M.D.   On: 05/12/2020 21:16   DG Abd Portable 1V  Result Date: 05/03/2020 CLINICAL DATA:  Abdominal distension. EXAM: PORTABLE ABDOMEN - 1 VIEW COMPARISON:  CT AP from 04/25/2020 FINDINGS: The bowel gas pattern is normal. No radio-opaque calculi or other significant radiographic abnormality are seen. Lumbar spondylosis identified. IMPRESSION: Nonobstructive bowel gas pattern. Electronically Signed   By: Kerby Moors M.D.   On: 05/03/2020 08:41   IR IMAGING GUIDED PORT INSERTION  Result Date: 05/28/2020 INDICATION: Recent diagnosis of large B-cell lymphoma. In need of durable intravenous access for chemotherapy administration. EXAM: IMPLANTED PORT A CATH PLACEMENT WITH ULTRASOUND AND FLUOROSCOPIC GUIDANCE COMPARISON:  CT the chest, abdomen and pelvis-04/20/2020 MEDICATIONS: Ancef 2 gm IV; The antibiotic was administered within an appropriate time interval prior to skin puncture. ANESTHESIA/SEDATION: Moderate (conscious) sedation was employed during this procedure. A total of Versed 4 mg and Fentanyl 100 mcg was administered intravenously. Moderate Sedation Time: 27 minutes. The patient's level of consciousness and vital signs were monitored continuously by radiology nursing throughout the procedure under my direct supervision. CONTRAST:  None FLUOROSCOPY TIME:  18 seconds (8 mGy) COMPLICATIONS: None immediate. PROCEDURE: The procedure, risks, benefits, and alternatives were explained to the patient. Questions regarding the procedure were encouraged and answered. The patient understands and consents to the procedure. The right neck and chest were prepped with chlorhexidine in a sterile fashion, and a sterile drape was applied covering the operative field. Maximum barrier sterile technique with sterile gowns and  gloves were used for the procedure. A timeout was performed prior to the initiation of the procedure. Local anesthesia was provided with 1% lidocaine with epinephrine. After creating a small venotomy incision, a micropuncture kit was utilized to access the internal jugular vein. Real-time ultrasound guidance was utilized for vascular access including the acquisition of a permanent ultrasound image documenting patency of the accessed vessel. The microwire was utilized to measure appropriate catheter length. A subcutaneous port pocket was then created along the upper chest wall utilizing a combination of sharp and blunt dissection. The pocket was irrigated with sterile saline. A single lumen "Slim" sized power injectable port was chosen for placement. The 8 Fr catheter was tunneled from the port pocket site to the venotomy incision. The port was placed in the pocket. The external catheter was trimmed to appropriate length. At the venotomy, an 8 Fr peel-away sheath was placed over a guidewire under fluoroscopic guidance. The catheter was then placed through the sheath and the sheath was removed. Final catheter positioning was confirmed and documented with a fluoroscopic spot radiograph. The  port was accessed with a Huber needle, aspirated and flushed with heparinized saline. The venotomy site was closed with an interrupted 4-0 Vicryl suture. The port pocket incision was closed with interrupted 2-0 Vicryl suture. The skin was opposed with a running subcuticular 4-0 Vicryl suture. Dermabond and Steri-strips were applied to both incisions. Dressings were applied. The patient tolerated the procedure well without immediate post procedural complication. FINDINGS: After catheter placement, the tip lies within the superior cavoatrial junction. The catheter aspirates and flushes normally and is ready for immediate use. The port a Catheter was left accessed for the patient's impending hospitalization and initiation of  chemotherapy. IMPRESSION: Successful placement of a right internal jugular approach power injectable Port-A-Cath. The catheter is ready for immediate use. Electronically Signed   By: Sandi Mariscal M.D.   On: 05/28/2020 10:55   IR PICC PLACEMENT RIGHT >5 YRS INC IMG GUIDE  Result Date: 05/01/2020 INDICATION: 74 year old male in need of a dual lumen PICC for chemotherapy. EXAM: PICC LINE PLACEMENT WITH ULTRASOUND AND FLUOROSCOPIC GUIDANCE MEDICATIONS: None. ANESTHESIA/SEDATION: None. FLUOROSCOPY TIME:  Fluoroscopy Time: 0 minutes 6 seconds (3 mGy). COMPLICATIONS: None immediate. PROCEDURE: The patient was advised of the possible risks and complications and agreed to undergo the procedure. The patient was then brought to the angiographic suite for the procedure. The right arm was prepped with chlorhexidine, draped in the usual sterile fashion using maximum barrier technique (cap and mask, sterile gown, sterile gloves, large sterile sheet, hand hygiene and cutaneous antisepsis) and infiltrated locally with 1% Lidocaine. Ultrasound demonstrated patency of the right basilic vein, and this was documented with an image. Under real-time ultrasound guidance, this vein was accessed with a 21 gauge micropuncture needle and image documentation was performed. A 0.018 wire was introduced in to the vein. Over this, a 6 [place] dual lumen power-injectable PICC was advanced to the lower SVC/right atrial junction. Fluoroscopy during the procedure and fluoro spot radiograph confirms appropriate catheter position. The catheter was flushed and covered with a sterile dressing. Catheter length: 40 cm IMPRESSION: Successful right arm Power PICC line placement with ultrasound and fluoroscopic guidance. The catheter is ready for use. Electronically Signed   By: Jacqulynn Cadet M.D.   On: 05/01/2020 15:56

## 2020-05-31 LAB — COMPREHENSIVE METABOLIC PANEL
ALT: 58 U/L — ABNORMAL HIGH (ref 0–44)
AST: 34 U/L (ref 15–41)
Albumin: 3.1 g/dL — ABNORMAL LOW (ref 3.5–5.0)
Alkaline Phosphatase: 134 U/L — ABNORMAL HIGH (ref 38–126)
Anion gap: 8 (ref 5–15)
BUN: 24 mg/dL — ABNORMAL HIGH (ref 8–23)
CO2: 22 mmol/L (ref 22–32)
Calcium: 8.3 mg/dL — ABNORMAL LOW (ref 8.9–10.3)
Chloride: 108 mmol/L (ref 98–111)
Creatinine, Ser: 0.85 mg/dL (ref 0.61–1.24)
GFR, Estimated: >60 mL/min (ref >60)
Glucose, Bld: 102 mg/dL — ABNORMAL HIGH (ref 70–99)
Potassium: 3.8 mmol/L (ref 3.5–5.1)
Sodium: 138 mmol/L (ref 135–145)
Total Bilirubin: 1.1 mg/dL (ref 0.3–1.2)
Total Protein: 5.6 g/dL — ABNORMAL LOW (ref 6.5–8.1)

## 2020-05-31 LAB — CBC WITH DIFFERENTIAL/PLATELET
Abs Immature Granulocytes: 0.1 10*3/uL — ABNORMAL HIGH (ref 0.00–0.07)
Basophils Absolute: 0 10*3/uL (ref 0.0–0.1)
Basophils Relative: 0 %
Eosinophils Absolute: 0 10*3/uL (ref 0.0–0.5)
Eosinophils Relative: 0 %
HCT: 26.6 % — ABNORMAL LOW (ref 39.0–52.0)
Hemoglobin: 9.1 g/dL — ABNORMAL LOW (ref 13.0–17.0)
Immature Granulocytes: 1 %
Lymphocytes Relative: 3 %
Lymphs Abs: 0.3 10*3/uL — ABNORMAL LOW (ref 0.7–4.0)
MCH: 30 pg (ref 26.0–34.0)
MCHC: 34.2 g/dL (ref 30.0–36.0)
MCV: 87.8 fL (ref 80.0–100.0)
Monocytes Absolute: 0.7 10*3/uL (ref 0.1–1.0)
Monocytes Relative: 8 %
Neutro Abs: 7.5 10*3/uL (ref 1.7–7.7)
Neutrophils Relative %: 88 %
Platelets: 214 10*3/uL (ref 150–400)
RBC: 3.03 MIL/uL — ABNORMAL LOW (ref 4.22–5.81)
RDW: 16.4 % — ABNORMAL HIGH (ref 11.5–15.5)
WBC: 8.6 10*3/uL (ref 4.0–10.5)
nRBC: 0 % (ref 0.0–0.2)

## 2020-05-31 LAB — LACTATE DEHYDROGENASE: LDH: 219 U/L — ABNORMAL HIGH (ref 98–192)

## 2020-05-31 LAB — URIC ACID: Uric Acid, Serum: 4.5 mg/dL (ref 3.7–8.6)

## 2020-05-31 MED ORDER — SODIUM CHLORIDE 0.9 % IV SOLN
10.0000 mg | Freq: Once | INTRAVENOUS | Status: AC
  Administered 2020-06-01: 10 mg via INTRAVENOUS
  Filled 2020-05-31: qty 1

## 2020-05-31 MED ORDER — ENOXAPARIN SODIUM 40 MG/0.4ML ~~LOC~~ SOLN
40.0000 mg | Freq: Every day | SUBCUTANEOUS | Status: DC
  Administered 2020-05-31 – 2020-06-01 (×2): 40 mg via SUBCUTANEOUS
  Filled 2020-05-31 (×2): qty 0.4

## 2020-05-31 MED ORDER — SODIUM CHLORIDE 0.9 % IV SOLN
600.0000 mg/m2 | Freq: Once | INTRAVENOUS | Status: AC
  Administered 2020-06-01: 1280 mg via INTRAVENOUS
  Filled 2020-05-31: qty 64

## 2020-05-31 MED ORDER — ONDANSETRON HCL 8 MG PO TABS
8.0000 mg | ORAL_TABLET | Freq: Once | ORAL | Status: AC
  Administered 2020-06-01: 8 mg via ORAL
  Filled 2020-05-31: qty 1

## 2020-05-31 NOTE — Progress Notes (Signed)
PT Cancellation Note-Screen  Patient Details Name: John [name] MRN: [id number] DOB: 1946/01/14   Cancelled Treatment:    Reason Eval/Treat Not Completed: PT screened, no needs identified, will sign off (Pt seen mobilizing in hallway with spouse. Pt has been ambulating multiple laps daily and mobilizing in room independently. denies falls or difficulty moving. will sign off at this time as pt has no acute PT needs.)   Verner Mould, DPT Acute Rehabilitation Services Office [phone] Pager [phone]

## 2020-05-31 NOTE — Progress Notes (Signed)
Initial Nutrition Assessment  DOCUMENTATION CODES:   Non-severe (moderate) malnutrition in context of chronic illness  INTERVENTION:   -Declines supplements -Provided "High Calorie, High Protein" handout and reviewed with patient and pt's wife -Reviewed high protein snack options  NUTRITION DIAGNOSIS:   Moderate Malnutrition related to chronic illness,cancer and cancer related treatments as evidenced by percent weight loss,moderate fat depletion.  GOAL:   Patient will meet greater than or equal to 90% of their needs  MONITOR:   PO intake,Supplement acceptance,Labs,Weight trends,I & O's  REASON FOR ASSESSMENT:   Consult Diet education  ASSESSMENT:   74 year old Caucasian male, with past medical history of hypertension, BPH.  He was admitted to [facility] in November 2021 due to fatigue and constipation.  He was found to have severe hypercalcemia and CT scan showed a large mediastinal and liver mass with diffuse retroperitoneal lymphadenopathy.Liver biopsy was performed during that hospitalization which showed large B-cell lymphoma.  Patient in room with wife at bedside. Pt reports his appetite is getting better and he ate 100% of his lunch. Pt with some questions as to how to limit weight loss during cancer treatments. States he was dealing with mucositis but this has resolved after he started a mouth rinse.  Provided handout and discussed ways to boost calories of food without increasing the amount of food you consume. Discussed protein supplements and pt states they fill him up so he ends up eating less. Review high protein snack options and pt was more willing to include these.   Per pt and wife, pt was weighing 216 lbs prior to weight loss. Per weight records, pt has lost 24 lbs since 11/17 (10% wt loss x 2 months, significant for time frame).   Labs reviewed. Medications: Vitamin C, Multivitamin with minerals daily, Miralax, Senokot  NUTRITION - FOCUSED PHYSICAL  EXAM:  Flowsheet Row Most Recent Value  Orbital Region No depletion  Upper Arm Region Moderate depletion  Thoracic and Lumbar Region Unable to assess  Buccal Region Mild depletion  Temple Region Mild depletion  Clavicle Bone Region No depletion  Clavicle and Acromion Bone Region No depletion  Scapular Bone Region No depletion  Dorsal Hand No depletion  Patellar Region Unable to assess  Anterior Thigh Region Unable to assess  Posterior Calf Region Unable to assess  Edema (RD Assessment) Unable to assess       Diet Order:   Diet Order            Diet regular Room service appropriate? Yes; Fluid consistency: Thin  Diet effective now                 EDUCATION NEEDS:   Education needs have been addressed  Skin:  Skin Assessment: Reviewed RN Assessment  Last BM:  12/22 -type 1  Height:   Ht Readings from Last 1 Encounters:  05/29/20 5\' 11"  (1.803 m)    Weight:   Wt Readings from Last 1 Encounters:  05/29/20 89.8 kg   BMI:  Body mass index is 27.62 kg/m.  Estimated Nutritional Needs:   Kcal:  2200-2400  Protein:  105-120g  Fluid:  2.2L/day   Clayton Bibles, MS, RD, LDN Inpatient Clinical Dietitian Contact information available via Amion

## 2020-05-31 NOTE — Care Management Important Message (Signed)
Important Message  Patient Details IM Letter given to the Patient. Name: WILKINS ELPERS MRN: [id number] Date of Birth: January 31, 1946   Medicare Important Message Given:  Yes     Kerin Salen 05/31/2020, 11:33 AM

## 2020-05-31 NOTE — Progress Notes (Addendum)
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 The patient knows to call the clinic with any problems, questions or concerns.   Mikey Bussing, NP 05/31/2020 10:44 AM Heath Lark, MD  Subjective:  Reports that he feels well Tolerated intrathecal methotrexate well overall Denies headaches and confusion Appetite better and denies nausea and vomiting Bowels are moving well Continues to tolerate chemotherapy well overall  Objective:  Vitals:   05/30/20 2203 05/31/20 0529  BP: 137/72 116/73  Pulse: 81 78  Resp:  16  Temp:  97.8 F (36.6 C)  SpO2:  97%     Intake/Output Summary (Last 24 hours) at 05/31/2020 1044 Last data filed at 05/31/2020 0400 Gross per 24 hour  Intake 300 ml  Output --  Net 300 ml    GENERAL:alert, no distress and comfortable SKIN: skin color, texture, turgor are normal, no rashes or significant lesions EYES: normal, Conjunctiva are pink and non-injected, sclera clear OROPHARYNX:no exudate, no erythema and lips, buccal mucosa, and tongue normal  NECK: supple, thyroid normal size, non-tender, without nodularity LYMPH:  no palpable lymphadenopathy in the cervical, axillary or inguinal LUNGS: clear to  auscultation and percussion with normal breathing effort HEART: regular rate & rhythm and no murmurs and no lower extremity edema ABDOMEN:abdomen soft, non-tender and normal bowel sounds Musculoskeletal:no cyanosis of digits and no clubbing  NEURO: alert & oriented x 3 with fluent speech, no focal motor/sensory deficits   Labs:  Recent Labs    05/07/20 0446 05/10/20 0956 05/29/20 0600 05/30/20 0515 05/31/20 0524  NA 132*   < > 135 137 138  K 3.5   < > 3.9 3.7 3.8  CL 100   < > 106 106 108  CO2 25   < > 21* 21* 22  GLUCOSE 100*   < > 109* 100* 102*  BUN 36*   < > 19 24* 24*  CREATININE 1.37*   < > 0.82 0.81 0.85  CALCIUM 8.8*   < > 8.3* 8.5* 8.3*  GFRNONAA 55*   < > >60 >60 >60  PROT 4.6*  4.7*   < > 5.8* 5.4* 5.6*  ALBUMIN 2.7*  2.7*   < > 3.1* 3.1* 3.1*  AST 327*  324*   < > 41 44* 34  ALT 689*  703*   < > 68* 66* 58*  ALKPHOS 170*  172*   < > 160* 144* 134*  BILITOT 1.7*  2.1*   < > 0.7 0.6 1.1  BILIDIR 0.7*  --   --   --   --   IBILI 1.0*  --   --   --   --    < > = values in this interval not displayed.    Studies:  CT ABDOMEN PELVIS W  CONTRAST  Result Date: 05/06/2020 CLINICAL DATA:  Abdominal pain. Increased liver function tests. Large B-cell lymphoma. Recent liver biopsy with hemoperitoneum. EXAM: CT ABDOMEN AND PELVIS WITH CONTRAST TECHNIQUE: Multidetector CT imaging of the abdomen and pelvis was performed using the standard protocol following bolus administration of intravenous contrast. CONTRAST:  54mL OMNIPAQUE IOHEXOL 300 MG/ML  SOLN COMPARISON:  04/25/2020 FINDINGS: Lower chest: Small left and trace right pleural effusions. Descending thoracic aortic atherosclerosis. Mild mitral valve calcification. Hepatobiliary: Dominant right hepatic lobe lesion 10.8 by 10.4 cm on image 16 of series 2, formerly about 9.1 cm based on my measurement. Mixed internal density favoring internal hematoma. There is also increased prominence of a lobulation along the inferior border  possibly also representing hematoma or additional lesion measuring 6.0 by 5.0 cm on image 24 series 2. Dependent density in the gallbladder favoring sludge, blood products, or hematoma. No biliary dilatation. Pancreas: Mesenteric mass mass along the inferior margin of the head of the pancreas measuring 4.5 by 3.7 cm on image 36 of series 2, roughly stable from prior. Spleen: Multiple masses in the spleen are present, little larger lesions measures 5.0 by 3.9 cm on image 19 of series 2. These were present previously although much less conspicuous due to the lack of IV contrast. Adrenals/Urinary Tract: Small nodule along the inferior margin of the right adrenal gland measuring 1.0 by 1.3 cm, probably an adenoma based on prior imaging but technically nonspecific on today's exam. Hypodense 2.2 by 2.4 cm lesion in the left mid upper kidney with fluid density, favoring cyst. Urinary bladder unremarkable. Stomach/Bowel: Air-levels in the distal colon favoring diarrheal process. Sigmoid colon diverticulosis. There is stranding along the sigmoid colon such that low-grade diverticulitis cannot be readily excluded. Vascular/Lymphatic: Aortoiliac atherosclerotic vascular disease. Scattered pathologic adenopathy. Adjacent to the pancreatic tail and splenic hilum, there are clustered abnormal lymph nodes with index node measuring 1.8 cm in short axis on image 23 of series 2, formerly 1.6 cm. Portacaval node 1.8 cm in short axis on image 28 series 2, formerly 2.1 cm. Left periaortic node 1.5 cm in short axis on image 36 series 2, formerly 1.7 cm. Left external iliac node 1.4 cm in short axis on image 60 of series 2, formerly the same. Left common iliac node 1.3 cm in short axis on image 51 series 2, formerly 1.8 cm. Other additional enlarged lymph nodes are present, the generally the lymph nodes are mildly reduced in size compared to the prior exam. Reproductive: Prostatomegaly Other: Complex ascites mildly reduced from the prior  exam compatible with hemoperitoneum. Musculoskeletal: Bridging spurring of the sacroiliac joints. 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 Mesenteric and subcutaneous edema compatible with third spacing of fluid. IMPRESSION: 1. Mild improvement in the adenopathy in the abdomen and pelvis. 2. Mild reduction in size of the complex ascites compatible with hemoperitoneum. 3. Large bilobed mass in the liver with internal heterogeneous high density probably from internal hematoma, difficult to compare in size to the prior exam due to low conspicuity on the prior exam. 4. Stable appearance of the mesenteric mass along the inferior margin of the head of the pancreas. 5. Multiple splenic masses, some of which are similar to prior, difficult to compare in size due to low conspicuity on prior noncontrast exam. 6. Air-levels in the distal colon favoring diarrheal process. 7. Sigmoid colon diverticulosis. There is stranding along the sigmoid  colon such that low-grade diverticulitis cannot be readily excluded. 8. Small left and trace right pleural effusions. 9. Prostatomegaly. 10. Multilevel lumbar impingement. 11. Small right adrenal nodule, probably an adenoma based on prior imaging but technically nonspecific on today's exam. 12. 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 13. Mesenteric and subcutaneous edema compatible with third spacing of fluid. 14. Aortic atherosclerosis. Aortic Atherosclerosis (ICD10-I70.0). Electronically Signed   By: Van Clines M.D.   On: 05/06/2020 13:56   DG Chest Port 1 View  Result Date: 05/12/2020 CLINICAL DATA:  Sepsis EXAM: PORTABLE CHEST 1 VIEW COMPARISON:  April 20, 2020 FINDINGS: There is a subtle right infrahilar  airspace opacity. No pneumothorax. No large pleural effusion. The heart size is stable. Aortic calcifications are noted. The previously demonstrated adenopathy in the upper mediastinum is improved. IMPRESSION: 1. Subtle right infrahilar airspace opacity may represent atelectasis or infiltrate. 2. Improved mediastinal adenopathy. 3. No pneumothorax or large pleural effusion. Electronically Signed   By: Constance Holster M.D.   On: 05/12/2020 21:16   DG Abd Portable 1V  Result Date: 05/03/2020 CLINICAL DATA:  Abdominal distension. EXAM: PORTABLE ABDOMEN - 1 VIEW COMPARISON:  CT AP from 04/25/2020 FINDINGS: The bowel gas pattern is normal. No radio-opaque calculi or other significant radiographic abnormality are seen. Lumbar spondylosis identified. IMPRESSION: Nonobstructive bowel gas pattern. Electronically Signed   By: Kerby Moors M.D.   On: 05/03/2020 08:41   DG FLUORO GUIDED NEEDLE PLC ASPIRATION/INJECTION LOC  Result Date: 05/30/2020 CLINICAL DATA:  Intrathecal methotrexate injection for lymphoma. EXAM: FLUOROSCOPICALLY GUIDED LUMBAR PUNCTURE FOR INTRATHECAL CHEMOTHERAPY FLUOROSCOPY TIME:  1 minutes 0 seconds. PROCEDURE: Informed consent was obtained from the patient prior to the procedure, including potential complications of headache, allergy, and pain. With the patient prone, the lower back was prepped with Betadine. 1% Lidocaine was used for local anesthesia. Lumbar puncture was performed at the L3-4 level using a gauge needle with return of initially blood tinged but then clearCSF. 12 mg of methotrexate was injected into the subarachnoid space. The patient tolerated the procedure well without apparent complication. IMPRESSION: Successful intrathecal methotrexate injection under fluoroscopy. Electronically Signed   By: Lorin Picket M.D.   On: 05/30/2020 13:10   IR IMAGING GUIDED PORT INSERTION  Result Date: 05/28/2020 INDICATION: Recent diagnosis of large B-cell lymphoma. In need of  durable intravenous access for chemotherapy administration. EXAM: IMPLANTED PORT A CATH PLACEMENT WITH ULTRASOUND AND FLUOROSCOPIC GUIDANCE COMPARISON:  CT the chest, abdomen and pelvis-04/20/2020 MEDICATIONS: Ancef 2 gm IV; The antibiotic was administered within an appropriate time interval prior to skin puncture. ANESTHESIA/SEDATION: Moderate (conscious) sedation was employed during this procedure. A total of Versed 4 mg and Fentanyl 100 mcg was administered intravenously. Moderate Sedation Time: 27 minutes. The patient's level of consciousness and vital signs were monitored continuously by radiology nursing throughout the procedure under my direct supervision. CONTRAST:  None FLUOROSCOPY TIME:  18 seconds (8 mGy) COMPLICATIONS: None immediate. PROCEDURE: The procedure, risks, benefits, and alternatives were explained to the patient. Questions regarding the procedure were encouraged and answered. The patient understands and consents to the procedure. The right neck and chest were prepped with chlorhexidine in a sterile fashion, and a sterile drape was applied covering the operative field. Maximum barrier sterile technique with sterile gowns and gloves were used for the procedure. A timeout was performed prior to the initiation of the procedure. Local anesthesia  was provided with 1% lidocaine with epinephrine. After creating a small venotomy incision, a micropuncture kit was utilized to access the internal jugular vein. Real-time ultrasound guidance was utilized for vascular access including the acquisition of a permanent ultrasound image documenting patency of the accessed vessel. The microwire was utilized to measure appropriate catheter length. A subcutaneous port pocket was then created along the upper chest wall utilizing a combination of sharp and blunt dissection. The pocket was irrigated with sterile saline. A single lumen "Slim" sized power injectable port was chosen for placement. The 8 Fr catheter was  tunneled from the port pocket site to the venotomy incision. The port was placed in the pocket. The external catheter was trimmed to appropriate length. At the venotomy, an 8 Fr peel-away sheath was placed over a guidewire under fluoroscopic guidance. The catheter was then placed through the sheath and the sheath was removed. Final catheter positioning was confirmed and documented with a fluoroscopic spot radiograph. The port was accessed with a Huber needle, aspirated and flushed with heparinized saline. The venotomy site was closed with an interrupted 4-0 Vicryl suture. The port pocket incision was closed with interrupted 2-0 Vicryl suture. The skin was opposed with a running subcuticular 4-0 Vicryl suture. Dermabond and Steri-strips were applied to both incisions. Dressings were applied. The patient tolerated the procedure well without immediate post procedural complication. FINDINGS: After catheter placement, the tip lies within the superior cavoatrial junction. The catheter aspirates and flushes normally and is ready for immediate use. The port a Catheter was left accessed for the patient's impending hospitalization and initiation of chemotherapy. IMPRESSION: Successful placement of a right internal jugular approach power injectable Port-A-Cath. The catheter is ready for immediate use. Electronically Signed   By: Sandi Mariscal M.D.   On: 05/28/2020 10:55

## 2020-06-01 DIAGNOSIS — E440 Moderate protein-calorie malnutrition: Secondary | ICD-10-CM | POA: Insufficient documentation

## 2020-06-01 LAB — CBC WITH DIFFERENTIAL/PLATELET
Abs Immature Granulocytes: 0.04 10*3/uL (ref 0.00–0.07)
Basophils Absolute: 0 10*3/uL (ref 0.0–0.1)
Basophils Relative: 1 %
Eosinophils Absolute: 0.1 10*3/uL (ref 0.0–0.5)
Eosinophils Relative: 1 %
HCT: 25.3 % — ABNORMAL LOW (ref 39.0–52.0)
Hemoglobin: 8.6 g/dL — ABNORMAL LOW (ref 13.0–17.0)
Immature Granulocytes: 1 %
Lymphocytes Relative: 4 %
Lymphs Abs: 0.2 10*3/uL — ABNORMAL LOW (ref 0.7–4.0)
MCH: 29.8 pg (ref 26.0–34.0)
MCHC: 34 g/dL (ref 30.0–36.0)
MCV: 87.5 fL (ref 80.0–100.0)
Monocytes Absolute: 0.2 10*3/uL (ref 0.1–1.0)
Monocytes Relative: 4 %
Neutro Abs: 4.7 10*3/uL (ref 1.7–7.7)
Neutrophils Relative %: 89 %
Platelets: 168 10*3/uL (ref 150–400)
RBC: 2.89 MIL/uL — ABNORMAL LOW (ref 4.22–5.81)
RDW: 16.1 % — ABNORMAL HIGH (ref 11.5–15.5)
WBC: 5.3 10*3/uL (ref 4.0–10.5)
nRBC: 0 % (ref 0.0–0.2)

## 2020-06-01 LAB — COMPREHENSIVE METABOLIC PANEL
ALT: 54 U/L — ABNORMAL HIGH (ref 0–44)
AST: 37 U/L (ref 15–41)
Albumin: 2.9 g/dL — ABNORMAL LOW (ref 3.5–5.0)
Alkaline Phosphatase: 112 U/L (ref 38–126)
Anion gap: 5 (ref 5–15)
BUN: 22 mg/dL (ref 8–23)
CO2: 24 mmol/L (ref 22–32)
Calcium: 8.3 mg/dL — ABNORMAL LOW (ref 8.9–10.3)
Chloride: 108 mmol/L (ref 98–111)
Creatinine, Ser: 0.73 mg/dL (ref 0.61–1.24)
GFR, Estimated: >60 mL/min (ref >60)
Glucose, Bld: 84 mg/dL (ref 70–99)
Potassium: 3.3 mmol/L — ABNORMAL LOW (ref 3.5–5.1)
Sodium: 137 mmol/L (ref 135–145)
Total Bilirubin: 1.1 mg/dL (ref 0.3–1.2)
Total Protein: 5.2 g/dL — ABNORMAL LOW (ref 6.5–8.1)

## 2020-06-01 LAB — URIC ACID: Uric Acid, Serum: 4.6 mg/dL (ref 3.7–8.6)

## 2020-06-01 LAB — LACTATE DEHYDROGENASE: LDH: 223 U/L — ABNORMAL HIGH (ref 98–192)

## 2020-06-01 NOTE — Discharge Summary (Signed)
Physician Discharge Summary  Patient ID: John BETSCH MRN: [id number] DOB/AGE: 1946/04/09 74 y.o.  Admit date: 05/28/2020 Discharge date: 06/01/2020  Primary Care Physician:  Christain Sacramento, MD   Discharge Diagnoses:    Present on Admission: . Diffuse large B cell lymphoma ([facility]) . 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   aspirin EC 81 MG tablet Take 81 mg by mouth daily.   BOOST MAX PROTEIN PO Take 237 mLs by mouth in the morning and at bedtime. Chocolate   diphenhydrAMINE 25 mg capsule Commonly known as: BENADRYL Take 1 capsule (25 mg total) by mouth at bedtime as needed for sleep.   docusate sodium 100 MG capsule Commonly known as: COLACE Take 200 mg by mouth 2 (two) times daily as needed for mild constipation.   doxazosin 4 MG tablet Commonly known as: CARDURA Take 4 mg by mouth daily.   HYDROcodone-acetaminophen 5-325 MG tablet Commonly known as: NORCO/VICODIN Take 1 tablet by mouth every 6 (six) hours as needed for moderate pain. What changed: how much to take   magic mouthwash w/lidocaine Soln Take 15 mLs by mouth 4 (four) times daily as needed for mouth pain.   melatonin 3 MG Tabs tablet Take 2 tablets (6 mg total) by mouth at bedtime as needed (sleep). What changed:   how much to take  when to take this   metoprolol tartrate 25 MG tablet Commonly known as: LOPRESSOR Take 0.5 tablets (12.5 mg total) by mouth 2 (two) times daily.   MULTIVITAMIN PO Take 1 tablet by mouth daily.   OMEGA 3 PO Take 1 tablet by mouth daily.   ondansetron 8 MG tablet Commonly known as:  ZOFRAN Take 1 tablet (8 mg total) by mouth every 8 (eight) hours as needed for nausea or vomiting.   pantoprazole 40 MG tablet Commonly known as: Protonix Take 1 tablet (40 mg total) by mouth daily.   prochlorperazine 10 MG tablet Commonly known as: COMPAZINE Take 1 tablet (10 mg total) by mouth every 6 (six) hours as needed for nausea or vomiting.   senna-docusate 8.6-50 MG tablet Commonly known as: Senokot-S Take 2 tablets by mouth 2 (two) times daily as needed for mild constipation.   simethicone 80 MG chewable tablet Commonly known as: MYLICON Chew 1 tablet (80 mg total) by mouth 4 (four) times daily as needed for flatulence.   sodium bicarbonate/sodium chloride Soln 1 application by Mouth Rinse route as needed for dry mouth or mouth pain.   vitamin C with rose hips 1000 MG tablet Take 1,000 mg by mouth daily.       Disposition and Follow-up: He has appointment to return to the cancer center Monday morning on June 04, 2020 for further follow-up and treatment.  The patient is aware of the appointment  Significant Diagnostic Studies:  CT ABDOMEN PELVIS W CONTRAST  Result Date: 05/06/2020 CLINICAL DATA:  Abdominal pain. Increased liver function tests. Large B-cell lymphoma. Recent liver biopsy with hemoperitoneum. EXAM: CT ABDOMEN AND PELVIS WITH CONTRAST TECHNIQUE: Multidetector CT imaging of the abdomen and pelvis was performed using the standard protocol  following bolus administration of intravenous contrast. CONTRAST:  23mL OMNIPAQUE IOHEXOL 300 MG/ML  SOLN COMPARISON:  04/25/2020 FINDINGS: Lower chest: Small left and trace right pleural effusions. Descending thoracic aortic atherosclerosis. Mild mitral valve calcification. Hepatobiliary: Dominant right hepatic lobe lesion 10.8 by 10.4 cm on image 16 of series 2, formerly about 9.1 cm based on my measurement. Mixed internal density favoring internal hematoma. There is also increased prominence of a lobulation along the  inferior border possibly also representing hematoma or additional lesion measuring 6.0 by 5.0 cm on image 24 series 2. Dependent density in the gallbladder favoring sludge, blood products, or hematoma. No biliary dilatation. Pancreas: Mesenteric mass mass along the inferior margin of the head of the pancreas measuring 4.5 by 3.7 cm on image 36 of series 2, roughly stable from prior. Spleen: Multiple masses in the spleen are present, little larger lesions measures 5.0 by 3.9 cm on image 19 of series 2. These were present previously although much less conspicuous due to the lack of IV contrast. Adrenals/Urinary Tract: Small nodule along the inferior margin of the right adrenal gland measuring 1.0 by 1.3 cm, probably an adenoma based on prior imaging but technically nonspecific on today's exam. Hypodense 2.2 by 2.4 cm lesion in the left mid upper kidney with fluid density, favoring cyst. Urinary bladder unremarkable. Stomach/Bowel: Air-levels in the distal colon favoring diarrheal process. Sigmoid colon diverticulosis. There is stranding along the sigmoid colon such that low-grade diverticulitis cannot be readily excluded. Vascular/Lymphatic: Aortoiliac atherosclerotic vascular disease. Scattered pathologic adenopathy. Adjacent to the pancreatic tail and splenic hilum, there are clustered abnormal lymph nodes with index node measuring 1.8 cm in short axis on image 23 of series 2, formerly 1.6 cm. Portacaval node 1.8 cm in short axis on image 28 series 2, formerly 2.1 cm. Left periaortic node 1.5 cm in short axis on image 36 series 2, formerly 1.7 cm. Left external iliac node 1.4 cm in short axis on image 60 of series 2, formerly the same. Left common iliac node 1.3 cm in short axis on image 51 series 2, formerly 1.8 cm. Other additional enlarged lymph nodes are present, the generally the lymph nodes are mildly reduced in size compared to the prior exam. Reproductive: Prostatomegaly Other: Complex ascites mildly reduced  from the prior exam compatible with hemoperitoneum. Musculoskeletal: Bridging spurring of the sacroiliac joints. 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 Mesenteric and subcutaneous edema compatible with third spacing of fluid. IMPRESSION: 1. Mild improvement in the adenopathy in the abdomen and pelvis. 2. Mild reduction in size of the complex ascites compatible with hemoperitoneum. 3. Large bilobed mass in the liver with internal heterogeneous high density probably from internal hematoma, difficult to compare in size to the prior exam due to low conspicuity on the prior exam. 4. Stable appearance of the mesenteric mass along the inferior margin of the head of the pancreas. 5. Multiple splenic masses, some of which are similar to prior, difficult to compare in size due to low conspicuity on prior noncontrast exam. 6. Air-levels in the distal colon favoring diarrheal process. 7. Sigmoid colon diverticulosis. There is stranding along the sigmoid colon such that low-grade diverticulitis cannot be readily excluded. 8. Small left and trace right pleural effusions. 9. Prostatomegaly. 10. Multilevel lumbar impingement. 11. Small right adrenal nodule, probably an adenoma based on prior imaging but technically nonspecific on today's exam. 12. Chronic grade  2 anterolisthesis of L5 on S1 with associated foraminal impingement, and multilevel impingement at other levels due to spondylosis and degenerative disc disease. 13. Mesenteric and subcutaneous edema compatible with third spacing of fluid. 14. Aortic atherosclerosis. Aortic Atherosclerosis (ICD10-I70.0). Electronically Signed   By: Van Clines M.D.   On: 05/06/2020 13:56   DG Chest Port 1 View  Result Date: 05/12/2020 CLINICAL DATA:  Sepsis EXAM: PORTABLE CHEST 1 VIEW COMPARISON:  April 20, 2020 FINDINGS: There is a subtle  right infrahilar airspace opacity. No pneumothorax. No large pleural effusion. The heart size is stable. Aortic calcifications are noted. The previously demonstrated adenopathy in the upper mediastinum is improved. IMPRESSION: 1. Subtle right infrahilar airspace opacity may represent atelectasis or infiltrate. 2. Improved mediastinal adenopathy. 3. No pneumothorax or large pleural effusion. Electronically Signed   By: Constance Holster M.D.   On: 05/12/2020 21:16   DG Abd Portable 1V  Result Date: 05/03/2020 CLINICAL DATA:  Abdominal distension. EXAM: PORTABLE ABDOMEN - 1 VIEW COMPARISON:  CT AP from 04/25/2020 FINDINGS: The bowel gas pattern is normal. No radio-opaque calculi or other significant radiographic abnormality are seen. Lumbar spondylosis identified. IMPRESSION: Nonobstructive bowel gas pattern. Electronically Signed   By: Kerby Moors M.D.   On: 05/03/2020 08:41   DG FLUORO GUIDED NEEDLE PLC ASPIRATION/INJECTION LOC  Result Date: 05/30/2020 CLINICAL DATA:  Intrathecal methotrexate injection for lymphoma. EXAM: FLUOROSCOPICALLY GUIDED LUMBAR PUNCTURE FOR INTRATHECAL CHEMOTHERAPY FLUOROSCOPY TIME:  1 minutes 0 seconds. PROCEDURE: Informed consent was obtained from the patient prior to the procedure, including potential complications of headache, allergy, and pain. With the patient prone, the lower back was prepped with Betadine. 1% Lidocaine was used for local anesthesia. Lumbar puncture was performed at the L3-4 level using a gauge needle with return of initially blood tinged but then clearCSF. 12 mg of methotrexate was injected into the subarachnoid space. The patient tolerated the procedure well without apparent complication. IMPRESSION: Successful intrathecal methotrexate injection under fluoroscopy. Electronically Signed   By: Lorin Picket M.D.   On: 05/30/2020 13:10   IR IMAGING GUIDED PORT INSERTION  Result Date: 05/28/2020 INDICATION: Recent diagnosis of large B-cell lymphoma.  In need of durable intravenous access for chemotherapy administration. EXAM: IMPLANTED PORT A CATH PLACEMENT WITH ULTRASOUND AND FLUOROSCOPIC GUIDANCE COMPARISON:  CT the chest, abdomen and pelvis-04/20/2020 MEDICATIONS: Ancef 2 gm IV; The antibiotic was administered within an appropriate time interval prior to skin puncture. ANESTHESIA/SEDATION: Moderate (conscious) sedation was employed during this procedure. A total of Versed 4 mg and Fentanyl 100 mcg was administered intravenously. Moderate Sedation Time: 27 minutes. The patient's level of consciousness and vital signs were monitored continuously by radiology nursing throughout the procedure under my direct supervision. CONTRAST:  None FLUOROSCOPY TIME:  18 seconds (8 mGy) COMPLICATIONS: None immediate. PROCEDURE: The procedure, risks, benefits, and alternatives were explained to the patient. Questions regarding the procedure were encouraged and answered. The patient understands and consents to the procedure. The right neck and chest were prepped with chlorhexidine in a sterile fashion, and a sterile drape was applied covering the operative field. Maximum barrier sterile technique with sterile gowns and gloves were used for the procedure. A timeout was performed prior to the initiation of the procedure. Local anesthesia was provided with 1% lidocaine with epinephrine. After creating a small venotomy incision, a micropuncture kit was utilized to access the internal jugular vein. Real-time ultrasound guidance was utilized for vascular access including the acquisition of a permanent ultrasound image documenting patency of the  accessed vessel. The microwire was utilized to measure appropriate catheter length. A subcutaneous port pocket was then created along the upper chest wall utilizing a combination of sharp and blunt dissection. The pocket was irrigated with sterile saline. A single lumen "Slim" sized power injectable port was chosen for placement. The 8 Fr  catheter was tunneled from the port pocket site to the venotomy incision. The port was placed in the pocket. The external catheter was trimmed to appropriate length. At the venotomy, an 8 Fr peel-away sheath was placed over a guidewire under fluoroscopic guidance. The catheter was then placed through the sheath and the sheath was removed. Final catheter positioning was confirmed and documented with a fluoroscopic spot radiograph. The port was accessed with a Huber needle, aspirated and flushed with heparinized saline. The venotomy site was closed with an interrupted 4-0 Vicryl suture. The port pocket incision was closed with interrupted 2-0 Vicryl suture. The skin was opposed with a running subcuticular 4-0 Vicryl suture. Dermabond and Steri-strips were applied to both incisions. Dressings were applied. The patient tolerated the procedure well without immediate post procedural complication. FINDINGS: After catheter placement, the tip lies within the superior cavoatrial junction. The catheter aspirates and flushes normally and is ready for immediate use. The port a Catheter was left accessed for the patient's impending hospitalization and initiation of chemotherapy. IMPRESSION: Successful placement of a right internal jugular approach power injectable Port-A-Cath. The catheter is ready for immediate use. Electronically Signed   By: Sandi Mariscal M.D.   On: 05/28/2020 10:55    Discharge Laboratory Values: Lab Results  Component Value Date   WBC 5.3 06/01/2020   HGB 8.6 (L) 06/01/2020   HCT 25.3 (L) 06/01/2020   MCV 87.5 06/01/2020   PLT 168 06/01/2020   Lab Results  Component Value Date   NA 137 06/01/2020   K 3.3 (L) 06/01/2020   CL 108 06/01/2020   CO2 24 06/01/2020    Brief H and P: For complete details please refer to admission H and P, but in brief, the patient had port placement on May 28, 2020 followed by admission to the hospital for cycle 2 of R-EPO CH.  He received minor dose  adjustment due to prior history of severe pancytopenia.  The dose of his antiemetics were also adjusted.  He was started on aggressive laxative regimen and denies complication throughout the hospitalization.  He received intrathecal methotrexate on May 31, 4195 without complications. At discharge, he has very mild electrolyte imbalance and anemia but otherwise asymptomatic. He has outpatient follow-up next week for repeat blood work  Physical Exam at Discharge: BP 120/62 (BP Location: Left Arm)   Pulse 73   Temp 97.8 F (36.6 C) (Oral)   Resp 18   Ht _0  (1.803 m)   Wt 198 lb (89.8 kg) Comment: taken yesterday  SpO2 97%   BMI 27.62 kg/m  GENERAL:alert, no distress and comfortable SKIN: skin color, texture, turgor are normal, no rashes or significant lesions EYES: normal, Conjunctiva are pink and non-injected, sclera clear OROPHARYNX:no exudate, no erythema and lips, buccal mucosa, and tongue normal  NECK: supple, thyroid normal size, non-tender, without nodularity LYMPH:  no palpable lymphadenopathy in the cervical, axillary or inguinal LUNGS: clear to auscultation and percussion with normal breathing effort HEART: regular rate & rhythm and no murmurs and no lower extremity edema ABDOMEN:abdomen soft, non-tender and normal bowel sounds Musculoskeletal:no cyanosis of digits and no clubbing  NEURO: alert & oriented x 3 with fluent  speech, no focal motor/sensory deficits  Hospital Course:  Active Problems:   Diffuse large B cell lymphoma ([facility])   Encounter for antineoplastic chemotherapy   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 Heath Lark [phone]  06/01/2020, 8:52 AM

## 2020-06-01 NOTE — Progress Notes (Signed)
Patient discharged to home with family, discharge instructions reviewed with patient who verbalized understanding. No new medications. 

## 2020-06-03 NOTE — Progress Notes (Signed)
[place]   [phone] Fax:([phone]) [phone]   Clinic Follow up Note   Patient Care Team: Christain Sacramento, MD as PCP - General (Family Medicine) 06/04/2020  CHIEF COMPLAINT: Follow up DLBCL  SUMMARY OF ONCOLOGIC HISTORY: Oncology History Overview Note  Cancer Staging Diffuse large B cell lymphoma ([place]) Staging form: Hodgkin and Non-Hodgkin Lymphoma, AJCC 8th Edition - Clinical stage from 04/23/2020: Stage IV (Diffuse large B-cell lymphoma) - Signed by Truitt Merle, MD on 04/29/2020    Diffuse large B cell lymphoma ([place])  04/20/2020 Imaging   CT CAP  IMPRESSION: 1. Large anterior mediastinal/prevascular space mass/adenopathy. The mass extends superiorly and abuts the upper trachea. 2. Large hypodense lesion in the right lobe of the liver as well as ill-defined splenic hypodense lesions. Further characterization with MRI without and with contrast recommended. The liver lesion is amenable to percutaneous tissue sampling. 3. Extensive mesenteric and retroperitoneal adenopathy. 4. Sigmoid diverticulosis. No bowel obstruction. Normal appendix. 5. Aortic Atherosclerosis (ICD10-I70.0).   04/23/2020 Cancer Staging   Staging form: Hodgkin and Non-Hodgkin Lymphoma, AJCC 8th Edition - Clinical stage from 04/23/2020: Stage IV (Diffuse large B-cell lymphoma) - Signed by Truitt Merle, MD on 04/29/2020   04/23/2020 Initial Biopsy   FINAL MICROSCOPIC DIAGNOSIS:   A. LIVER, RIGHT MASS, NEEDLE CORE BIOPSY:  - Large B-cell lymphoma  - See comment     COMMENT:   The sections show needle core biopsy fragments displaying effacement of  the architecture by a dense infiltrate of primarily large atypical  lymphoid appearing cells displaying vesicular chromatin and small  nucleoli.  This is associated with apoptosis and brisk mitosis.  The  appearance is diffuse with lack of atypical follicles.  A large battery  of immunohistochemical stains was performed and shows that  the atypical  lymphoid cells are positive for LCA, CD20, PAX 5, BCL-2, BCL6, CD5,  MUM-1 (partial).  The atypical lymphoid cells are negative for CD10,  CD30, CD34, CD138, cyclin D1, TdT, and EBV in situ hybridization,  synaptophysin, cytokeratin AE1/AE3, cytokeratin 20, cytokeratin 7,  cytokeratin 5/6, TTF-1, CD56, CDX2, and p40.  There is an admixed  relatively minor population of T-cells as primarily seen with CD3.  The  overall features are consistent with large B-cell lymphoma, ABC subtype.  The lymphomatous process displays expression of CD5.  This phenotype is  seen in 5-10% of mostly de novo large B-cell lymphoma cases or rarely as  a transformation of a low-grade B-cell lymphoma such as small  lymphocytic lymphoma.  Clinical correlation is recommended.    04/25/2020 Imaging   CT AP  IMPRESSION: 1. Development of high-density ascites in the abdomen and pelvis. Findings are most compatible with a small to moderate amount of hemoperitoneum and related to the recent liver biopsy. 2. Extensive lymphadenopathy throughout the abdomen and pelvis with a large mass or nodal mass near the pancreatic head. There are additional lesions involving the liver and spleen. Findings are suggestive for metastatic disease. 3. Development of small bilateral pleural effusions.   These results were called by telephone at the time of interpretation on 04/25/2020 at 1:43 pm to provider ERIC [place] who verbally acknowledged these results.   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 He was admitted 05/28/20 - 06/01/20 for cycle 2 EPOCH with minor dose reductions for pancytopenia. He tolerated  chemo well per inpatient notes. He received IT methotrexate on 12/23. There was no evidence of tumor lysis. He was discharged home on 12/24. The plan is to receive Rituxan and udenyca today.   He returns with his wife, feels a little shaky today but otherwise good. He notes cycle 2 was better, with less side effects and felt more prepared. Almost no mucositis and only mild constipation. Appetite improving. He is up at home, mobile, but gets weak occasionally. Eating and drinking well, no n/v. Denies fever, chills, cough, chest pain, dyspnea, bleeding, neuropathy, or pain.     MEDICAL HISTORY:  Past Medical History:  Diagnosis Date  . Cancer ([place])   . Goals of care, counseling/discussion 05/06/2020  . Hypertension   . Knee pain, right     SURGICAL HISTORY: Past Surgical History:  Procedure Laterality Date  . FOOT SURGERY    . IR IMAGING GUIDED PORT INSERTION  05/28/2020  . TONSILLECTOMY      I have reviewed the social history and family history with the patient and they are unchanged from previous note.  ALLERGIES:  has No Known Allergies.  MEDICATIONS:  Current Outpatient Medications  Medication Sig Dispense Refill  . allopurinol (ZYLOPRIM) 100 MG tablet Take 1 tablet (100 mg total) by mouth daily. 30 tablet 0  . Ascorbic Acid (VITAMIN C WITH ROSE HIPS) 1000 MG tablet Take 1,000 mg by mouth daily.    Marland Kitchen aspirin EC 81 MG tablet Take 81 mg by mouth daily.    . diphenhydrAMINE (BENADRYL) 25 mg capsule Take 1 capsule (25 mg total) by mouth at bedtime as needed for sleep. (Patient not taking: Reported on 05/28/2020) 30 capsule 0  . docusate sodium (COLACE) 100 MG capsule Take 200 mg by mouth 2 (two) times daily as needed for mild constipation.     Marland Kitchen doxazosin (CARDURA) 4 MG tablet Take 4 mg by mouth daily.    Marland Kitchen HYDROcodone-acetaminophen (NORCO/VICODIN) 5-325 MG tablet Take 1 tablet by mouth every 6 (six) hours as needed for moderate pain. (Patient taking differently: Take 0.5-1 tablets  by mouth every 6 (six) hours as needed for moderate pain.) 30 tablet 0  . magic mouthwash w/lidocaine SOLN Take 15 mLs by mouth 4 (four) times daily as needed for mouth pain. 400 mL 0  . melatonin 3 MG TABS tablet Take 2 tablets (6 mg total) by mouth at bedtime as needed (sleep). (Patient taking differently: Take 3 mg by mouth at bedtime.) 30 tablet 0  . metoprolol tartrate (LOPRESSOR) 25 MG tablet Take 0.5 tablets (12.5 mg total) by mouth 2 (two) times daily. 30 tablet 0  . Multiple Vitamins-Minerals (MULTIVITAMIN PO) Take 1 tablet by mouth daily.    . Nutritional Supplements (BOOST MAX PROTEIN PO) Take 237 mLs by mouth in the morning and at bedtime. Chocolate    . Omega-3 Fatty Acids (OMEGA 3 PO) Take 1 tablet by mouth daily.    . ondansetron (ZOFRAN) 8 MG tablet Take 1 tablet (8 mg total) by mouth every 8 (eight) hours as needed for nausea or vomiting. 20 tablet 0  . pantoprazole (PROTONIX) 40 MG tablet Take 1 tablet (40 mg total) by mouth daily. 30 tablet 11  . prochlorperazine (COMPAZINE) 10 MG tablet Take 1 tablet (10 mg total) by mouth every 6 (six) hours as needed for nausea or vomiting.  30 tablet 0  . senna-docusate (SENOKOT-S) 8.6-50 MG tablet Take 2 tablets by mouth 2 (two) times daily as needed for mild constipation.    . simethicone (MYLICON) 80 MG chewable tablet Chew 1 tablet (80 mg total) by mouth 4 (four) times daily as needed for flatulence. (Patient not taking: Reported on 05/28/2020) 30 tablet 0  . Sodium Chloride-Sodium Bicarb (SODIUM BICARBONATE/SODIUM CHLORIDE) SOLN 1 application by Mouth Rinse route as needed for dry mouth or mouth pain.     No current facility-administered medications for this visit.    PHYSICAL EXAMINATION: ECOG PERFORMANCE STATUS: 1 - Symptomatic but completely ambulatory  Vitals:   06/04/20 1000  BP: (!) 124/55  Pulse: (!) 110  Resp: 18  Temp: 98.2 F (36.8 C)  SpO2: 100%   Filed Weights   06/04/20 1000  Weight: 197 lb 11.2 oz (89.7 kg)     GENERAL:alert, no distress and comfortable SKIN: no rash  EYES:  sclera clear OROPHARYNX: no thrush or ulcers LUNGS: clear with normal breathing effort HEART: tachycardic, regular rhythm, no lower extremity edema NEURO: alert & oriented x 3 with fluent speech, no focal motor deficits PAC covered with gauze   LABORATORY DATA:  I have reviewed the data as listed CBC Latest Ref Rng & Units 06/04/2020 06/01/2020 05/31/2020  WBC 4.0 - 10.5 K/uL 6.0 5.3 8.6  Hemoglobin 13.0 - 17.0 g/dL 8.7(L) 8.6(L) 9.1(L)  Hematocrit 39.0 - 52.0 % 25.0(L) 25.3(L) 26.6(L)  Platelets 150 - 400 K/uL 137(L) 168 214     CMP Latest Ref Rng & Units 06/04/2020 06/01/2020 05/31/2020  Glucose 70 - 99 mg/dL 131(H) 84 102(H)  BUN 8 - 23 mg/dL 22 22 24(H)  Creatinine 0.61 - 1.24 mg/dL 0.90 0.73 0.85  Sodium 135 - 145 mmol/L 135 137 138  Potassium 3.5 - 5.1 mmol/L 3.6 3.3(L) 3.8  Chloride 98 - 111 mmol/L 105 108 108  CO2 22 - 32 mmol/L 24 24 22   Calcium 8.9 - 10.3 mg/dL 8.7(L) 8.3(L) 8.3(L)  Total Protein 6.5 - 8.1 g/dL 5.7(L) 5.2(L) 5.6(L)  Total Bilirubin 0.3 - 1.2 mg/dL 1.0 1.1 1.1  Alkaline Phos 38 - 126 U/L 111 112 134(H)  AST 15 - 41 U/L 18 37 34  ALT 0 - 44 U/L 40 54(H) 58(H)      RADIOGRAPHIC STUDIES: I have personally reviewed the radiological images as listed and agreed with the findings in the report. No results found.   ASSESSMENT & PLAN: John [name] a 74 y.o.malewith    1.Diffuse large B cell lymphoma, ABC Subtype, stage IV  -After worsening fatigue pt presented to ED. 04/20/20 CT CAP showedlarge mediastinalmassanddiffuseliver masses, and diffuse retroperitoneal lymphadenopathy. -Liver biopsy from 04/23/20 shows diffuse large B-Cell lymphoma,ABC subtype, which carries a worse prognosis. Given his liver metastasis and LN involvement, this is stage IV. His lymphoma FISH panel is still pending to rule out double hit lymphoma -He consented to first-line more intensive  EPOCH-R q. 21 days x 6 cycles with plan to restage PET scan after cycles 3 and 6 -He is also at high risk for CNS involvement due to his high risk features(advanced age, elevated LDH, stage IV with extranodal metastasis). Prophylactic IT methotrexate added with cycle 2   2. Pancytopenia  -Secondary to chemotherapy, he received G-CSF. -Admitted for neutropenic fever, s/p blood and platelet transfusions -Cycle 2 dose reduced for pancytopenia  3.  High risk for tumor lysis -Cr 1.75 on 11/22, s/p rasburicase in the hospital -No signs of  TLS today, continue allopurinol   Disposition:  John [name] appears stable. He completed cycle 2 dose-reduced EPOCH from 12/20 - 12/24 and prophylactic IT methotrexate. He tolerated better, less mucositis and constipation. Side effects well managed with supportive meds at home. He is recovering well.   Labs and recent hospital course were reviewed. There are no signs of TLS, I refilled allopurinol. The plan is to proceed with Rituxan and Udenyca today. He will receive IVF for soft BP and mild tachycardia today. He can hold tonight's dose of metoprolol. I encouraged him to drink more.   He will return for lab and follow-up with Cassie H, PA and supportive care if needed on 06/07/2020.  Follow-up with Dr. Burr Medico 06/15/2020 before admission for cycle 3.  The plan is to restage with PET after 3 cycles.  All questions were answered. The patient knows to call the clinic with any problems, questions or concerns. No barriers to learning were detected.  Total encounter time was 30 minutes.     Alla Feeling, NP 06/04/20

## 2020-06-04 ENCOUNTER — Inpatient Hospital Stay: Payer: Medicare HMO

## 2020-06-04 ENCOUNTER — Inpatient Hospital Stay (HOSPITAL_BASED_OUTPATIENT_CLINIC_OR_DEPARTMENT_OTHER): Payer: Medicare HMO | Admitting: Nurse Practitioner

## 2020-06-04 ENCOUNTER — Other Ambulatory Visit: Payer: Self-pay

## 2020-06-04 ENCOUNTER — Encounter: Payer: Self-pay | Admitting: Nurse Practitioner

## 2020-06-04 VITALS — BP 122/52 | HR 97 | Temp 98.0°F | Resp 18

## 2020-06-04 VITALS — BP 124/55 | HR 110 | Temp 98.2°F | Resp 18 | Ht 71.0 in | Wt 197.7 lb

## 2020-06-04 DIAGNOSIS — Z5112 Encounter for antineoplastic immunotherapy: Secondary | ICD-10-CM | POA: Diagnosis present

## 2020-06-04 DIAGNOSIS — Z5189 Encounter for other specified aftercare: Secondary | ICD-10-CM | POA: Diagnosis not present

## 2020-06-04 DIAGNOSIS — C8339 Diffuse large B-cell lymphoma, extranodal and solid organ sites: Secondary | ICD-10-CM | POA: Diagnosis present

## 2020-06-04 DIAGNOSIS — D649 Anemia, unspecified: Secondary | ICD-10-CM | POA: Diagnosis not present

## 2020-06-04 DIAGNOSIS — C8330 Diffuse large B-cell lymphoma, unspecified site: Secondary | ICD-10-CM

## 2020-06-04 DIAGNOSIS — E883 Tumor lysis syndrome: Secondary | ICD-10-CM | POA: Diagnosis not present

## 2020-06-04 DIAGNOSIS — N179 Acute kidney failure, unspecified: Secondary | ICD-10-CM | POA: Diagnosis not present

## 2020-06-04 DIAGNOSIS — K5900 Constipation, unspecified: Secondary | ICD-10-CM | POA: Diagnosis not present

## 2020-06-04 DIAGNOSIS — C8338 Diffuse large B-cell lymphoma, lymph nodes of multiple sites: Secondary | ICD-10-CM

## 2020-06-04 LAB — CMP (CANCER CENTER ONLY)
ALT: 40 U/L (ref 0–44)
AST: 18 U/L (ref 15–41)
Albumin: 3.1 g/dL — ABNORMAL LOW (ref 3.5–5.0)
Alkaline Phosphatase: 111 U/L (ref 38–126)
Anion gap: 6 (ref 5–15)
BUN: 22 mg/dL (ref 8–23)
CO2: 24 mmol/L (ref 22–32)
Calcium: 8.7 mg/dL — ABNORMAL LOW (ref 8.9–10.3)
Chloride: 105 mmol/L (ref 98–111)
Creatinine: 0.9 mg/dL (ref 0.61–1.24)
GFR, Estimated: >60 mL/min (ref >60)
Glucose, Bld: 131 mg/dL — ABNORMAL HIGH (ref 70–99)
Potassium: 3.6 mmol/L (ref 3.5–5.1)
Sodium: 135 mmol/L (ref 135–145)
Total Bilirubin: 1 mg/dL (ref 0.3–1.2)
Total Protein: 5.7 g/dL — ABNORMAL LOW (ref 6.5–8.1)

## 2020-06-04 LAB — CBC WITH DIFFERENTIAL (CANCER CENTER ONLY)
Abs Immature Granulocytes: 0.05 10*3/uL (ref 0.00–0.07)
Basophils Absolute: 0 10*3/uL (ref 0.0–0.1)
Basophils Relative: 1 %
Eosinophils Absolute: 0.2 10*3/uL (ref 0.0–0.5)
Eosinophils Relative: 4 %
HCT: 25 % — ABNORMAL LOW (ref 39.0–52.0)
Hemoglobin: 8.7 g/dL — ABNORMAL LOW (ref 13.0–17.0)
Immature Granulocytes: 1 %
Lymphocytes Relative: 2 %
Lymphs Abs: 0.1 10*3/uL — ABNORMAL LOW (ref 0.7–4.0)
MCH: 29.8 pg (ref 26.0–34.0)
MCHC: 34.8 g/dL (ref 30.0–36.0)
MCV: 85.6 fL (ref 80.0–100.0)
Monocytes Absolute: 0.1 10*3/uL (ref 0.1–1.0)
Monocytes Relative: 1 %
Neutro Abs: 5.5 10*3/uL (ref 1.7–7.7)
Neutrophils Relative %: 91 %
Platelet Count: 137 10*3/uL — ABNORMAL LOW (ref 150–400)
RBC: 2.92 MIL/uL — ABNORMAL LOW (ref 4.22–5.81)
RDW: 16.2 % — ABNORMAL HIGH (ref 11.5–15.5)
WBC Count: 6 10*3/uL (ref 4.0–10.5)
nRBC: 0 % (ref 0.0–0.2)

## 2020-06-04 LAB — URIC ACID: Uric Acid, Serum: 4.9 mg/dL (ref 3.7–8.6)

## 2020-06-04 LAB — LACTATE DEHYDROGENASE: LDH: 278 U/L — ABNORMAL HIGH (ref 98–192)

## 2020-06-04 MED ORDER — SODIUM CHLORIDE 0.9 % IV SOLN
Freq: Once | INTRAVENOUS | Status: AC
  Filled 2020-06-04: qty 250

## 2020-06-04 MED ORDER — DIPHENHYDRAMINE HCL 25 MG PO CAPS
50.0000 mg | ORAL_CAPSULE | Freq: Once | ORAL | Status: AC
  Administered 2020-06-04: 50 mg via ORAL

## 2020-06-04 MED ORDER — HEPARIN SOD (PORK) LOCK FLUSH 100 UNIT/ML IV SOLN
500.0000 [IU] | Freq: Once | INTRAVENOUS | Status: DC
  Filled 2020-06-04: qty 5

## 2020-06-04 MED ORDER — SODIUM CHLORIDE 0.9% FLUSH
10.0000 mL | Freq: Once | INTRAVENOUS | Status: AC
  Administered 2020-06-04: 10 mL via INTRAVENOUS
  Filled 2020-06-04: qty 10

## 2020-06-04 MED ORDER — SODIUM CHLORIDE 0.9% FLUSH
10.0000 mL | INTRAVENOUS | Status: DC | PRN
  Administered 2020-06-04: 10 mL
  Filled 2020-06-04: qty 10

## 2020-06-04 MED ORDER — ACETAMINOPHEN 325 MG PO TABS
ORAL_TABLET | ORAL | Status: AC
  Filled 2020-06-04: qty 2

## 2020-06-04 MED ORDER — ACETAMINOPHEN 325 MG PO TABS
650.0000 mg | ORAL_TABLET | Freq: Once | ORAL | Status: AC
  Administered 2020-06-04: 650 mg via ORAL

## 2020-06-04 MED ORDER — HEPARIN SOD (PORK) LOCK FLUSH 100 UNIT/ML IV SOLN
500.0000 [IU] | Freq: Once | INTRAVENOUS | Status: AC | PRN
  Administered 2020-06-04: 500 [IU]
  Filled 2020-06-04: qty 5

## 2020-06-04 MED ORDER — SODIUM CHLORIDE 0.9 % IV SOLN
INTRAVENOUS | Status: DC
  Filled 2020-06-04: qty 250

## 2020-06-04 MED ORDER — RITUXIMAB-PVVR CHEMO 500 MG/50ML IV SOLN
375.0000 mg/m2 | Freq: Once | INTRAVENOUS | Status: AC
  Administered 2020-06-04: 800 mg via INTRAVENOUS
  Filled 2020-06-04: qty 50

## 2020-06-04 MED ORDER — DIPHENHYDRAMINE HCL 25 MG PO CAPS
ORAL_CAPSULE | ORAL | Status: AC
  Filled 2020-06-04: qty 2

## 2020-06-04 MED ORDER — PEGFILGRASTIM-CBQV 6 MG/0.6ML ~~LOC~~ SOSY
PREFILLED_SYRINGE | SUBCUTANEOUS | Status: AC
  Filled 2020-06-04: qty 0.6

## 2020-06-04 MED ORDER — PEGFILGRASTIM-CBQV 6 MG/0.6ML ~~LOC~~ SOSY
6.0000 mg | PREFILLED_SYRINGE | Freq: Once | SUBCUTANEOUS | Status: AC
  Administered 2020-06-04: 6 mg via SUBCUTANEOUS

## 2020-06-04 NOTE — Patient Instructions (Signed)
Rituximab injection What is this medicine? RITUXIMAB (ri TUX i mab) is a monoclonal antibody. It is used to treat certain types of cancer like non-Hodgkin lymphoma and chronic lymphocytic leukemia. It is also used to treat rheumatoid arthritis, granulomatosis with polyangiitis (or Wegener's granulomatosis), microscopic polyangiitis, and pemphigus vulgaris. This medicine may be used for other purposes; ask your health care provider or pharmacist if you have questions. COMMON BRAND NAME(S): Rituxan, RUXIENCE What should I tell my health care provider before I take this medicine? They need to know if you have any of these conditions:  heart disease  infection (especially a virus infection such as hepatitis B, chickenpox, cold sores, or herpes)  immune system problems  irregular heartbeat  kidney disease  low blood counts, like low white cell, platelet, or red cell counts  lung or breathing disease, like asthma  recently received or scheduled to receive a vaccine  an unusual or allergic reaction to rituximab, other medicines, foods, dyes, or preservatives  pregnant or trying to get pregnant  breast-feeding How should I use this medicine? This medicine is for infusion into a vein. It is administered in a hospital or clinic by a specially trained health care professional. A special MedGuide will be given to you by the pharmacist with each prescription and refill. Be sure to read this information carefully each time. Talk to your pediatrician regarding the use of this medicine in children. This medicine is not approved for use in children. Overdosage: If you think you have taken too much of this medicine contact a poison control center or emergency room at once. NOTE: This medicine is only for you. Do not share this medicine with others. What if I miss a dose? It is important not to miss a dose. Call your doctor or health care professional if you are unable to keep an appointment. What  may interact with this medicine?  cisplatin  live virus vaccines This list may not describe all possible interactions. Give your health care provider a list of all the medicines, herbs, non-prescription drugs, or dietary supplements you use. Also tell them if you smoke, drink alcohol, or use illegal drugs. Some items may interact with your medicine. What should I watch for while using this medicine? Your condition will be monitored carefully while you are receiving this medicine. You may need blood work done while you are taking this medicine. This medicine can cause serious allergic reactions. To reduce your risk you may need to take medicine before treatment with this medicine. Take your medicine as directed. In some patients, this medicine may cause a serious brain infection that may cause death. If you have any problems seeing, thinking, speaking, walking, or standing, tell your healthcare professional right away. If you cannot reach your healthcare professional, urgently seek other source of medical care. Call your doctor or health care professional for advice if you get a fever, chills or sore throat, or other symptoms of a cold or flu. Do not treat yourself. This drug decreases your body's ability to fight infections. Try to avoid being around people who are sick. Do not become pregnant while taking this medicine or for at least 12 months after stopping it. Women should inform their doctor if they wish to become pregnant or think they might be pregnant. There is a potential for serious side effects to an unborn child. Talk to your health care professional or pharmacist for more information. Do not breast-feed an infant while taking this medicine or for at   least 6 months after stopping it. What side effects may I notice from receiving this medicine? Side effects that you should report to your doctor or health care professional as soon as possible:  allergic reactions like skin rash, itching or  hives; swelling of the face, lips, or tongue  breathing problems  chest pain  changes in vision  diarrhea  headache with fever, neck stiffness, sensitivity to light, nausea, or confusion  fast, irregular heartbeat  loss of memory  low blood counts - this medicine may decrease the number of white blood cells, red blood cells and platelets. You may be at increased risk for infections and bleeding.  mouth sores  problems with balance, talking, or walking  redness, blistering, peeling or loosening of the skin, including inside the mouth  signs of infection - fever or chills, cough, sore throat, pain or difficulty passing urine  signs and symptoms of kidney injury like trouble passing urine or change in the amount of urine  signs and symptoms of liver injury like dark yellow or brown urine; general ill feeling or flu-like symptoms; light-colored stools; loss of appetite; nausea; right upper belly pain; unusually weak or tired; yellowing of the eyes or skin  signs and symptoms of low blood pressure like dizziness; feeling faint or lightheaded, falls; unusually weak or tired  stomach pain  swelling of the ankles, feet, hands  unusual bleeding or bruising  vomiting Side effects that usually do not require medical attention (report to your doctor or health care professional if they continue or are bothersome):  headache  joint pain  muscle cramps or muscle pain  nausea  tiredness This list may not describe all possible side effects. Call your doctor for medical advice about side effects. You may report side effects to FDA at 1-800-FDA-1088. Where should I keep my medicine? This drug is given in a hospital or clinic and will not be stored at home. NOTE: This sheet is a summary. It may not cover all possible information. If you have questions about this medicine, talk to your doctor, pharmacist, or health care provider.  2020 Elsevier/Gold Standard (2018-07-07  22:01:36)  

## 2020-06-05 NOTE — Progress Notes (Signed)
[facility] OFFICE PROGRESS NOTE  Christain Sacramento, MD [address] [place] [address]  DIAGNOSIS: Follow up DLBCL  Oncology History Overview Note  Cancer Staging Diffuse large B cell lymphoma ([place]) Staging form: Hodgkin and Non-Hodgkin Lymphoma, AJCC 8th Edition - Clinical stage from 04/23/2020: Stage IV (Diffuse large B-cell lymphoma) - Signed by Truitt Merle, MD on 04/29/2020    Diffuse large B cell lymphoma ([place])  04/20/2020 Imaging   CT CAP  IMPRESSION: 1. Large anterior mediastinal/prevascular space mass/adenopathy. The mass extends superiorly and abuts the upper trachea. 2. Large hypodense lesion in the right lobe of the liver as well as ill-defined splenic hypodense lesions. Further characterization with MRI without and with contrast recommended. The liver lesion is amenable to percutaneous tissue sampling. 3. Extensive mesenteric and retroperitoneal adenopathy. 4. Sigmoid diverticulosis. No bowel obstruction. Normal appendix. 5. Aortic Atherosclerosis (ICD10-I70.0).   04/23/2020 Cancer Staging   Staging form: Hodgkin and Non-Hodgkin Lymphoma, AJCC 8th Edition - Clinical stage from 04/23/2020: Stage IV (Diffuse large B-cell lymphoma) - Signed by Truitt Merle, MD on 04/29/2020   04/23/2020 Initial Biopsy   FINAL MICROSCOPIC DIAGNOSIS:   A. LIVER, RIGHT MASS, NEEDLE CORE BIOPSY:  - Large B-cell lymphoma  - See comment     COMMENT:   The sections show needle core biopsy fragments displaying effacement of  the architecture by a dense infiltrate of primarily large atypical  lymphoid appearing cells displaying vesicular chromatin and small  nucleoli.  This is associated with apoptosis and brisk mitosis.  The  appearance is diffuse with lack of atypical follicles.  A large battery  of immunohistochemical stains was performed and shows that the atypical  lymphoid cells are positive for LCA, CD20, PAX 5, BCL-2, BCL6, CD5,  MUM-1 (partial).  The atypical  lymphoid cells are negative for CD10,  CD30, CD34, CD138, cyclin D1, TdT, and EBV in situ hybridization,  synaptophysin, cytokeratin AE1/AE3, cytokeratin 20, cytokeratin 7,  cytokeratin 5/6, TTF-1, CD56, CDX2, and p40.  There is an admixed  relatively minor population of T-cells as primarily seen with CD3.  The  overall features are consistent with large B-cell lymphoma, ABC subtype.  The lymphomatous process displays expression of CD5.  This phenotype is  seen in 5-10% of mostly de novo large B-cell lymphoma cases or rarely as  a transformation of a low-grade B-cell lymphoma such as small  lymphocytic lymphoma.  Clinical correlation is recommended.    04/25/2020 Imaging   CT AP  IMPRESSION: 1. Development of high-density ascites in the abdomen and pelvis. Findings are most compatible with a small to moderate amount of hemoperitoneum and related to the recent liver biopsy. 2. Extensive lymphadenopathy throughout the abdomen and pelvis with a large mass or nodal mass near the pancreatic head. There are additional lesions involving the liver and spleen. Findings are suggestive for metastatic disease. 3. Development of small bilateral pleural effusions.   These results were called by telephone at the time of interpretation on 04/25/2020 at 1:43 pm to provider ERIC [place] , who verbally acknowledged these results.   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 male returns returns to the clinic today for a follow-up visit accompanied by his  wife. He was recently admitted for inpatient chemotherapy on 05/28/2020 to 06/01/2020. He received dose reduced EPOCH-R due to pancytopenia with cycle #1. The patient was last  seen in the clinic on 06/04/2020 he received rituximab Udenyca that day.  He received IV fluids on 06/04/2020 for a slightly soft blood pressure and mild tachycardia.  Today, the patient is feeling fair except for fatigue and occasional weakness and lightheadedness.  He denies any fever, chills, night sweats, or weight loss. He states he is eating better. He estimates that he is drinking 4-5 12 ounce bottles of water daily. He denies any mucositis at this time and has been using mouth rinses regularly.  He denies any nausea or vomiting. He reports that he takes sennakot BID for constipation. He has soft stools but not diarrhea.  He denies any peripheral neuropathy.  He denies any recent signs and symptoms of infection including sore throat, cough, shortness of breath, skin infections, or dysuria.  The patient denies any abnormal bleeding or bruising. The patient is here today for evaluation and repeat lab work.  MEDICAL HISTORY: Past Medical History:  Diagnosis Date  . Cancer (HCC)   . Goals of care, counseling/discussion 05/06/2020  . Hypertension   . Knee pain, right     ALLERGIES:  has No Known Allergies.  MEDICATIONS:  Current Outpatient Medications  Medication Sig Dispense Refill  . potassium chloride SA (KLOR-CON) 20 MEQ tablet Take 1 tablet (20 mEq total) by mouth daily. 5 tablet 0  . allopurinol (ZYLOPRIM) 100 MG tablet Take 1 tablet (100 mg total) by mouth daily. 30 tablet 0  . Ascorbic Acid (VITAMIN C WITH ROSE HIPS) 1000 MG tablet Take 1,000 mg by mouth daily.    . aspirin EC 81 MG tablet Take 81 mg by mouth daily.    . diphenhydrAMINE (BENADRYL) 25 mg capsule Take 1 capsule (25 mg total) by mouth at bedtime as needed for sleep. (Patient not taking: Reported on 05/28/2020) 30 capsule 0  . docusate sodium (COLACE) 100 MG capsule Take 200 mg by mouth 2 (two) times daily as needed for mild constipation.     . doxazosin (CARDURA) 4 MG tablet Take 4 mg by mouth daily.    .  HYDROcodone-acetaminophen (NORCO/VICODIN) 5-325 MG tablet Take 1 tablet by mouth every 6 (six) hours as needed for moderate pain. (Patient taking differently: Take 0.5-1 tablets by mouth every 6 (six) hours as needed for moderate pain.) 30 tablet 0  . magic mouthwash w/lidocaine SOLN Take 15 mLs by mouth 4 (four) times daily as needed for mouth pain. 400 mL 0  . melatonin 3 MG TABS tablet Take 2 tablets (6 mg total) by mouth at bedtime as needed (sleep). (Patient taking differently: Take 3 mg by mouth at bedtime.) 30 tablet 0  . metoprolol tartrate (LOPRESSOR) 25 MG tablet Take 0.5 tablets (12.5 mg total) by mouth 2 (two) times daily. 30 tablet 0  . Multiple Vitamins-Minerals (MULTIVITAMIN PO) Take 1 tablet by mouth daily.    . Nutritional Supplements (BOOST MAX PROTEIN PO) Take 237 mLs by mouth in the morning and at bedtime. Chocolate    . Omega-3 Fatty Acids (OMEGA 3 PO) Take 1 tablet by mouth daily.    . ondansetron (ZOFRAN) 8 MG tablet Take 1 tablet (8 mg total) by mouth every 8 (eight) hours as needed for nausea or vomiting. 20 tablet 0  . pantoprazole (PROTONIX) 40 MG tablet Take 1 tablet (40 mg total) by mouth daily. 30 tablet   3  . prochlorperazine (COMPAZINE) 10 MG tablet Take 1 tablet (10 mg total) by mouth every 6 (six) hours as needed for nausea or vomiting. 30 tablet 0  . senna-docusate (SENOKOT-S) 8.6-50 MG tablet Take 2 tablets by mouth 2 (two) times daily as needed for mild constipation.    . simethicone (MYLICON) 80 MG chewable tablet Chew 1 tablet (80 mg total) by mouth 4 (four) times daily as needed for flatulence. (Patient not taking: Reported on 05/28/2020) 30 tablet 0  . Sodium Chloride-Sodium Bicarb (SODIUM BICARBONATE/SODIUM CHLORIDE) SOLN 1 application by Mouth Rinse route as needed for dry mouth or mouth pain.     No current facility-administered medications for this visit.    SURGICAL HISTORY:  Past Surgical History:  Procedure Laterality Date  . FOOT SURGERY    . IR  IMAGING GUIDED PORT INSERTION  05/28/2020  . TONSILLECTOMY      REVIEW OF SYSTEMS:   Review of Systems  Constitutional: Positive for fatigue. Negative for appetite change, chills, fever and unexpected weight change.  HENT: Negative for mouth sores, nosebleeds, sore throat and trouble swallowing.   Eyes: Negative for eye problems and icterus.  Respiratory: Negative for cough, hemoptysis, shortness of breath and wheezing.   Cardiovascular: Negative for chest pain and leg swelling.  Gastrointestinal: Positive for occasional constipation. Negative for abdominal pain, diarrhea, nausea and vomiting.  Genitourinary: Negative for bladder incontinence, difficulty urinating, dysuria, frequency and hematuria.   Musculoskeletal: Negative for back pain, gait problem, neck pain and neck stiffness.  Skin: Negative for itching and rash.  Neurological: Positive for occasional lightheadedness. Negative for dizziness, extremity weakness, gait problem, headaches, and seizures.  Hematological: Negative for adenopathy. Does not bruise/bleed easily.  Psychiatric/Behavioral: Negative for confusion, depression and sleep disturbance. The patient is not nervous/anxious.     PHYSICAL EXAMINATION:  Blood pressure 131/60, pulse (!) 109, temperature 98.6 F (37 C), temperature source Tympanic, resp. rate 18, height 5' 11" (1.803 m), weight 197 lb 12.8 oz (89.7 kg), SpO2 100 %.  ECOG PERFORMANCE STATUS: 1 - Symptomatic but completely ambulatory  Physical Exam  Constitutional: Oriented to person, place, and time and well-developed, well-nourished, and in no distress.  HENT:  Head: Normocephalic and atraumatic.  Mouth/Throat: Oropharynx is clear and moist. No oropharyngeal exudate.  Eyes: Conjunctivae are normal. Right eye exhibits no discharge. Left eye exhibits no discharge. No scleral icterus.  Neck: Normal range of motion. Neck supple.  Cardiovascular: Mild tachycardia, regular rhythm, normal heart sounds and  intact distal pulses.   Pulmonary/Chest: Effort normal and breath sounds normal. No respiratory distress. No wheezes. No rales.  Abdominal: Soft. Bowel sounds are normal. Exhibits no distension and no mass. There is no tenderness.  Musculoskeletal: Normal range of motion. Exhibits no edema.  Lymphadenopathy:    No cervical adenopathy.  Neurological: Alert and oriented to person, place, and time. Exhibits normal muscle tone. Gait normal. Coordination normal.  Skin: The patient is pale. Skin is warm and dry. No rash noted. Not diaphoretic. No erythema. Psychiatric: Mood, memory and judgment normal.  Vitals reviewed.  LABORATORY DATA: Lab Results  Component Value Date   WBC 6.9 06/07/2020   HGB 7.7 (L) 06/07/2020   HCT 22.3 (L) 06/07/2020   MCV 87.1 06/07/2020   PLT 90 (L) 06/07/2020      Chemistry      Component Value Date/Time   NA 135 06/07/2020 1410   K 3.3 (L) 06/07/2020 1410   CL 107 06/07/2020 1410   CO2 24   06/07/2020 1410   BUN 15 06/07/2020 1410   CREATININE 0.84 06/07/2020 1410      Component Value Date/Time   CALCIUM 8.4 (L) 06/07/2020 1410   ALKPHOS 114 06/07/2020 1410   AST 25 06/07/2020 1410   ALT 48 (H) 06/07/2020 1410   BILITOT 0.6 06/07/2020 1410       RADIOGRAPHIC STUDIES:  DG Chest Port 1 View  Result Date: 05/12/2020 CLINICAL DATA:  Sepsis EXAM: PORTABLE CHEST 1 VIEW COMPARISON:  April 20, 2020 FINDINGS: There is a subtle right infrahilar airspace opacity. No pneumothorax. No large pleural effusion. The heart size is stable. Aortic calcifications are noted. The previously demonstrated adenopathy in the upper mediastinum is improved. IMPRESSION: 1. Subtle right infrahilar airspace opacity may represent atelectasis or infiltrate. 2. Improved mediastinal adenopathy. 3. No pneumothorax or large pleural effusion. Electronically Signed   By: Christopher  Green M.D.   On: 05/12/2020 21:16   DG FLUORO GUIDED NEEDLE PLC ASPIRATION/INJECTION LOC  Result Date:  05/30/2020 CLINICAL DATA:  Intrathecal methotrexate injection for lymphoma. EXAM: FLUOROSCOPICALLY GUIDED LUMBAR PUNCTURE FOR INTRATHECAL CHEMOTHERAPY FLUOROSCOPY TIME:  1 minutes 0 seconds. PROCEDURE: Informed consent was obtained from the patient prior to the procedure, including potential complications of headache, allergy, and pain. With the patient prone, the lower back was prepped with Betadine. 1% Lidocaine was used for local anesthesia. Lumbar puncture was performed at the L3-4 level using a gauge needle with return of initially blood tinged but then clearCSF. 12 mg of methotrexate was injected into the subarachnoid space. The patient tolerated the procedure well without apparent complication. IMPRESSION: Successful intrathecal methotrexate injection under fluoroscopy. Electronically Signed   By: Melinda  Blietz M.D.   On: 05/30/2020 13:10   IR IMAGING GUIDED PORT INSERTION  Result Date: 05/28/2020 INDICATION: Recent diagnosis of large B-cell lymphoma. In need of durable intravenous access for chemotherapy administration. EXAM: IMPLANTED PORT A CATH PLACEMENT WITH ULTRASOUND AND FLUOROSCOPIC GUIDANCE COMPARISON:  CT the chest, abdomen and pelvis-04/20/2020 MEDICATIONS: Ancef 2 gm IV; The antibiotic was administered within an appropriate time interval prior to skin puncture. ANESTHESIA/SEDATION: Moderate (conscious) sedation was employed during this procedure. A total of Versed 4 mg and Fentanyl 100 mcg was administered intravenously. Moderate Sedation Time: 27 minutes. The patient's level of consciousness and vital signs were monitored continuously by radiology nursing throughout the procedure under my direct supervision. CONTRAST:  None FLUOROSCOPY TIME:  18 seconds (8 mGy) COMPLICATIONS: None immediate. PROCEDURE: The procedure, risks, benefits, and alternatives were explained to the patient. Questions regarding the procedure were encouraged and answered. The patient understands and consents to the  procedure. The right neck and chest were prepped with chlorhexidine in a sterile fashion, and a sterile drape was applied covering the operative field. Maximum barrier sterile technique with sterile gowns and gloves were used for the procedure. A timeout was performed prior to the initiation of the procedure. Local anesthesia was provided with 1% lidocaine with epinephrine. After creating a small venotomy incision, a micropuncture kit was utilized to access the internal jugular vein. Real-time ultrasound guidance was utilized for vascular access including the acquisition of a permanent ultrasound image documenting patency of the accessed vessel. The microwire was utilized to measure appropriate catheter length. A subcutaneous port pocket was then created along the upper chest wall utilizing a combination of sharp and blunt dissection. The pocket was irrigated with sterile saline. A single lumen "Slim" sized power injectable port was chosen for placement. The 8 Fr catheter was tunneled from the port   pocket site to the venotomy incision. The port was placed in the pocket. The external catheter was trimmed to appropriate length. At the venotomy, an 8 Fr peel-away sheath was placed over a guidewire under fluoroscopic guidance. The catheter was then placed through the sheath and the sheath was removed. Final catheter positioning was confirmed and documented with a fluoroscopic spot radiograph. The port was accessed with a Huber needle, aspirated and flushed with heparinized saline. The venotomy site was closed with an interrupted 4-0 Vicryl suture. The port pocket incision was closed with interrupted 2-0 Vicryl suture. The skin was opposed with a running subcuticular 4-0 Vicryl suture. Dermabond and Steri-strips were applied to both incisions. Dressings were applied. The patient tolerated the procedure well without immediate post procedural complication. FINDINGS: After catheter placement, the tip lies within the  superior cavoatrial junction. The catheter aspirates and flushes normally and is ready for immediate use. The port a Catheter was left accessed for the patient's impending hospitalization and initiation of chemotherapy. IMPRESSION: Successful placement of a right internal jugular approach power injectable Port-A-Cath. The catheter is ready for immediate use. Electronically Signed   By: Sandi Mariscal M.D.   On: 05/28/2020 10:55     ASSESSMENT/PLAN:  Domenique Southers Footeis a 74 y.o.malewith    1.Diffuse large B cell lymphoma, ABC Subtype, stage IV  -After worsening fatigue pt presented to ED. 04/20/20 CT CAP showedlarge mediastinalmassanddiffuseliver masses, and diffuse retroperitoneal lymphadenopathy. -Liver biopsy from 04/23/20 shows diffuse large B-Cell lymphoma,ABC subtype, which carries a worse prognosis. Given his liver metastasis and LN involvement, this is stage IV. His lymphoma FISH panel is still pending to rule out double hit lymphoma -He consented to first-line more intensiveEPOCH-Rq. 21 days x 6 cycles with plan to restage PET scan after cycles 3 and 6 -He is also at high risk for CNS involvement due to his high risk features(advanced age, elevated LDH, stage IV with extranodal metastasis).Prophylactic IT methotrexate added with cycle 2 -Labs reviewed. Platelet count 90k, Hbg 7.7. WBC 6.9. Potassium low at 3.3.  -Will arrange for 2 units of blood to be given on 06/09/20. Patient reporting fatigue, lightheadedness occasionally. Exam shows pallor and mild tachycardia.   -F/U with Dr. Burr Medico on 1/7 before cycle #3.  2. Pancytopenia  -Secondary to chemotherapy, he received G-CSF. -Admitted for neutropenic fever, s/p blood and platelet transfusions -Cycle 2 dose reduced for pancytopenia -Hgb 7.7 today (06/07/20). No bleeding symptoms. Patient reports fatigue. Will arrange for 2 units of RBC's on 06/09/20.   3.High risk for tumor lysis -Cr1.75 on 11/22, s/p rasburicase in the  hospital -No signs of TLS today (06/07/20). continue allopurinol. Refilled allopurinol today.   Plan: -Labs reviewed. Will arrange for 2 units of blood on 06/09/20 -20 meq daily for 5 days of potassium sent to pharmacy -Refill allopurinol. Refilled protonix.   -F/U with Dr. Burr Medico on 06/15/20 before starting cycle #3.      Orders Placed This Encounter  Procedures  . Informed Consent Details: Physician/Practitioner Attestation; Transcribe to consent form and obtain patient signature    Standing Status:   Standing    Number of Occurrences:   1    Order Specific Question:   Physician/Practitioner attestation of informed consent for blood and or blood product transfusion    Answer:   I, the physician/practitioner, attest that I have discussed with the patient the benefits, risks, side effects, alternatives, likelihood of achieving goals and potential problems during recovery for the procedure that I have provided informed  consent.    Order Specific Question:   Product(s)    Answer:   All Product(s)  . Prepare RBC (crossmatch)    Standing Status:   Standing    Number of Occurrences:   1    Order Specific Question:   # of Units    Answer:   2 units    Order Specific Question:   Transfusion Indications    Answer:   Symptomatic Anemia    Order Specific Question:   Number of Units to Keep Ahead    Answer:   NO units ahead    Order Specific Question:   If emergent release call blood bank    Answer:   Not emergent release      L , PA-C 06/07/20  

## 2020-06-07 ENCOUNTER — Other Ambulatory Visit: Payer: Self-pay

## 2020-06-07 ENCOUNTER — Encounter: Payer: Self-pay | Admitting: Physician Assistant

## 2020-06-07 ENCOUNTER — Inpatient Hospital Stay: Payer: Medicare HMO

## 2020-06-07 ENCOUNTER — Inpatient Hospital Stay (HOSPITAL_BASED_OUTPATIENT_CLINIC_OR_DEPARTMENT_OTHER): Payer: Medicare HMO | Admitting: Physician Assistant

## 2020-06-07 VITALS — BP 131/60 | HR 109 | Temp 98.6°F | Resp 18 | Ht 71.0 in | Wt 197.8 lb

## 2020-06-07 DIAGNOSIS — D649 Anemia, unspecified: Secondary | ICD-10-CM

## 2020-06-07 DIAGNOSIS — C8338 Diffuse large B-cell lymphoma, lymph nodes of multiple sites: Secondary | ICD-10-CM

## 2020-06-07 DIAGNOSIS — Z95828 Presence of other vascular implants and grafts: Secondary | ICD-10-CM

## 2020-06-07 DIAGNOSIS — C8339 Diffuse large B-cell lymphoma, extranodal and solid organ sites: Secondary | ICD-10-CM

## 2020-06-07 DIAGNOSIS — T451X5A Adverse effect of antineoplastic and immunosuppressive drugs, initial encounter: Secondary | ICD-10-CM

## 2020-06-07 DIAGNOSIS — Z5112 Encounter for antineoplastic immunotherapy: Secondary | ICD-10-CM | POA: Diagnosis not present

## 2020-06-07 DIAGNOSIS — E876 Hypokalemia: Secondary | ICD-10-CM

## 2020-06-07 DIAGNOSIS — T451X5D Adverse effect of antineoplastic and immunosuppressive drugs, subsequent encounter: Secondary | ICD-10-CM

## 2020-06-07 DIAGNOSIS — D6481 Anemia due to antineoplastic chemotherapy: Secondary | ICD-10-CM

## 2020-06-07 LAB — CMP (CANCER CENTER ONLY)
ALT: 48 U/L — ABNORMAL HIGH (ref 0–44)
AST: 25 U/L (ref 15–41)
Albumin: 3.1 g/dL — ABNORMAL LOW (ref 3.5–5.0)
Alkaline Phosphatase: 114 U/L (ref 38–126)
Anion gap: 4 — ABNORMAL LOW (ref 5–15)
BUN: 15 mg/dL (ref 8–23)
CO2: 24 mmol/L (ref 22–32)
Calcium: 8.4 mg/dL — ABNORMAL LOW (ref 8.9–10.3)
Chloride: 107 mmol/L (ref 98–111)
Creatinine: 0.84 mg/dL (ref 0.61–1.24)
GFR, Estimated: >60 mL/min (ref >60)
Glucose, Bld: 140 mg/dL — ABNORMAL HIGH (ref 70–99)
Potassium: 3.3 mmol/L — ABNORMAL LOW (ref 3.5–5.1)
Sodium: 135 mmol/L (ref 135–145)
Total Bilirubin: 0.6 mg/dL (ref 0.3–1.2)
Total Protein: 5.7 g/dL — ABNORMAL LOW (ref 6.5–8.1)

## 2020-06-07 LAB — CBC WITH DIFFERENTIAL (CANCER CENTER ONLY)
Abs Immature Granulocytes: 0 10*3/uL (ref 0.00–0.07)
Band Neutrophils: 4 %
Basophils Absolute: 0.1 10*3/uL (ref 0.0–0.1)
Basophils Relative: 2 %
Eosinophils Absolute: 0.1 10*3/uL (ref 0.0–0.5)
Eosinophils Relative: 2 %
HCT: 22.3 % — ABNORMAL LOW (ref 39.0–52.0)
Hemoglobin: 7.7 g/dL — ABNORMAL LOW (ref 13.0–17.0)
Lymphocytes Relative: 2 %
Lymphs Abs: 0.1 10*3/uL — ABNORMAL LOW (ref 0.7–4.0)
MCH: 30.1 pg (ref 26.0–34.0)
MCHC: 34.5 g/dL (ref 30.0–36.0)
MCV: 87.1 fL (ref 80.0–100.0)
Monocytes Absolute: 0.1 10*3/uL (ref 0.1–1.0)
Monocytes Relative: 2 %
Neutro Abs: 6.3 10*3/uL (ref 1.7–7.7)
Neutrophils Relative %: 88 %
Platelet Count: 90 10*3/uL — ABNORMAL LOW (ref 150–400)
RBC: 2.56 MIL/uL — ABNORMAL LOW (ref 4.22–5.81)
RDW: 16.1 % — ABNORMAL HIGH (ref 11.5–15.5)
WBC Count: 6.9 10*3/uL (ref 4.0–10.5)
nRBC: 0 % (ref 0.0–0.2)

## 2020-06-07 LAB — PREPARE RBC (CROSSMATCH)

## 2020-06-07 MED ORDER — SODIUM CHLORIDE 0.9% FLUSH
10.0000 mL | INTRAVENOUS | Status: DC | PRN
  Administered 2020-06-07: 10 mL via INTRAVENOUS
  Filled 2020-06-07: qty 10

## 2020-06-07 MED ORDER — PANTOPRAZOLE SODIUM 40 MG PO TBEC: 40.0000 mg | DELAYED_RELEASE_TABLET | Freq: Every day | ORAL | 3 refills | Status: DC

## 2020-06-07 MED ORDER — ALLOPURINOL 100 MG PO TABS: 100.0000 mg | ORAL_TABLET | Freq: Every day | ORAL | 0 refills | Status: DC

## 2020-06-07 MED ORDER — POTASSIUM CHLORIDE CRYS ER 20 MEQ PO TBCR: 20.0000 meq | EXTENDED_RELEASE_TABLET | Freq: Every day | ORAL | 0 refills | Status: DC

## 2020-06-07 MED ORDER — HEPARIN SOD (PORK) LOCK FLUSH 100 UNIT/ML IV SOLN
500.0000 [IU] | Freq: Once | INTRAVENOUS | Status: AC
  Administered 2020-06-07: 500 [IU] via INTRAVENOUS
  Filled 2020-06-07: qty 5

## 2020-06-09 ENCOUNTER — Inpatient Hospital Stay: Payer: Medicare HMO | Attending: Hematology

## 2020-06-09 ENCOUNTER — Other Ambulatory Visit: Payer: Self-pay | Admitting: Physician Assistant

## 2020-06-09 ENCOUNTER — Other Ambulatory Visit: Payer: Self-pay

## 2020-06-09 DIAGNOSIS — D61810 Antineoplastic chemotherapy induced pancytopenia: Secondary | ICD-10-CM | POA: Diagnosis not present

## 2020-06-09 DIAGNOSIS — E876 Hypokalemia: Secondary | ICD-10-CM

## 2020-06-09 DIAGNOSIS — R5383 Other fatigue: Secondary | ICD-10-CM | POA: Insufficient documentation

## 2020-06-09 DIAGNOSIS — Z5112 Encounter for antineoplastic immunotherapy: Secondary | ICD-10-CM | POA: Diagnosis present

## 2020-06-09 DIAGNOSIS — C8339 Diffuse large B-cell lymphoma, extranodal and solid organ sites: Secondary | ICD-10-CM | POA: Insufficient documentation

## 2020-06-09 DIAGNOSIS — D649 Anemia, unspecified: Secondary | ICD-10-CM | POA: Insufficient documentation

## 2020-06-09 DIAGNOSIS — N179 Acute kidney failure, unspecified: Secondary | ICD-10-CM | POA: Insufficient documentation

## 2020-06-09 DIAGNOSIS — Z5189 Encounter for other specified aftercare: Secondary | ICD-10-CM | POA: Insufficient documentation

## 2020-06-09 DIAGNOSIS — R634 Abnormal weight loss: Secondary | ICD-10-CM | POA: Insufficient documentation

## 2020-06-09 MED ORDER — SODIUM CHLORIDE 0.9% FLUSH
10.0000 mL | Freq: Once | INTRAVENOUS | Status: AC
  Administered 2020-06-09: 10 mL via INTRAVENOUS
  Filled 2020-06-09: qty 10

## 2020-06-09 MED ORDER — HEPARIN SOD (PORK) LOCK FLUSH 100 UNIT/ML IV SOLN
500.0000 [IU] | Freq: Once | INTRAVENOUS | Status: AC
  Administered 2020-06-09: 500 [IU] via INTRAVENOUS
  Filled 2020-06-09: qty 5

## 2020-06-09 MED ORDER — SODIUM CHLORIDE 0.9% IV SOLUTION
250.0000 mL | Freq: Once | INTRAVENOUS | Status: AC
  Administered 2020-06-09: 250 mL via INTRAVENOUS
  Filled 2020-06-09: qty 250

## 2020-06-09 MED ORDER — DIPHENHYDRAMINE HCL 25 MG PO CAPS
25.0000 mg | ORAL_CAPSULE | Freq: Once | ORAL | Status: AC
  Administered 2020-06-09: 25 mg via ORAL

## 2020-06-09 MED ORDER — ACETAMINOPHEN 325 MG PO TABS
650.0000 mg | ORAL_TABLET | Freq: Once | ORAL | Status: AC
  Administered 2020-06-09: 650 mg via ORAL

## 2020-06-09 NOTE — Patient Instructions (Signed)

## 2020-06-10 LAB — TYPE AND SCREEN
ABO/RH(D): A POS
Antibody Screen: NEGATIVE
Unit division: 0
Unit division: 0

## 2020-06-10 LAB — BPAM RBC
Blood Product Expiration Date: 202201142359
Blood Product Expiration Date: 202201152359
ISSUE DATE / TIME: 202201010858
ISSUE DATE / TIME: 202201011052
Unit Type and Rh: 6200
Unit Type and Rh: 6200

## 2020-06-13 NOTE — Progress Notes (Signed)
[facility]   [phone] Fax:([phone]   Clinic Follow up Note   Patient Care Team: Barbie Banner, MD as PCP - General (Family Medicine)  Date of Service:  06/15/2020  CHIEF COMPLAINT: F/u ofDiffuse large B cell lymphoma  SUMMARY OF ONCOLOGIC HISTORY: Oncology History Overview Note  Cancer Staging Diffuse large B cell lymphoma (HCC) Staging form: Hodgkin and Non-Hodgkin Lymphoma, AJCC 8th Edition - Clinical stage from 04/23/2020: Stage IV (Diffuse large B-cell lymphoma) - Signed by Malachy Mood, MD on 04/29/2020    Diffuse large B cell lymphoma (HCC)  04/20/2020 Imaging   CT CAP  IMPRESSION: 1. Large anterior mediastinal/prevascular space mass/adenopathy. The mass extends superiorly and abuts the upper trachea. 2. Large hypodense lesion in the right lobe of the liver as well as ill-defined splenic hypodense lesions. Further characterization with MRI without and with contrast recommended. The liver lesion is amenable to percutaneous tissue sampling. 3. Extensive mesenteric and retroperitoneal adenopathy. 4. Sigmoid diverticulosis. No bowel obstruction. Normal appendix. 5. Aortic Atherosclerosis (ICD10-I70.0).   04/23/2020 Cancer Staging   Staging form: Hodgkin and Non-Hodgkin Lymphoma, AJCC 8th Edition - Clinical stage from 04/23/2020: Stage IV (Diffuse large B-cell lymphoma) - Signed by Malachy Mood, MD on 04/29/2020   04/23/2020 Initial Biopsy   FINAL MICROSCOPIC DIAGNOSIS:   A. LIVER, RIGHT MASS, NEEDLE CORE BIOPSY:  - Large B-cell lymphoma  - See comment     COMMENT:   The sections show needle core biopsy fragments displaying effacement of  the architecture by a dense infiltrate of primarily large atypical  lymphoid appearing cells displaying vesicular chromatin and small  nucleoli.  This is associated with apoptosis and brisk mitosis.  The  appearance is diffuse with lack of atypical follicles.  A large battery  of  immunohistochemical stains was performed and shows that the atypical  lymphoid cells are positive for LCA, CD20, PAX 5, BCL-2, BCL6, CD5,  MUM-1 (partial).  The atypical lymphoid cells are negative for CD10,  CD30, CD34, CD138, cyclin D1, TdT, and EBV in situ hybridization,  synaptophysin, cytokeratin AE1/AE3, cytokeratin 20, cytokeratin 7,  cytokeratin 5/6, TTF-1, CD56, CDX2, and p40.  There is an admixed  relatively minor population of T-cells as primarily seen with CD3.  The  overall features are consistent with large B-cell lymphoma, ABC subtype.  The lymphomatous process displays expression of CD5.  This phenotype is  seen in 5-10% of mostly de novo large B-cell lymphoma cases or rarely as  a transformation of a low-grade B-cell lymphoma such as small  lymphocytic lymphoma.  Clinical correlation is recommended.    04/25/2020 Imaging   CT AP  IMPRESSION: 1. Development of high-density ascites in the abdomen and pelvis. Findings are most compatible with a small to moderate amount of hemoperitoneum and related to the recent liver biopsy. 2. Extensive lymphadenopathy throughout the abdomen and pelvis with a large mass or nodal mass near the pancreatic head. There are additional lesions involving the liver and spleen. Findings are suggestive for metastatic disease. 3. Development of small bilateral pleural effusions.   These results were called by telephone at the time of interpretation on 04/25/2020 at 1:43 pm to provider ERIC [place] , who verbally acknowledged these results.   04/26/2020 Initial Diagnosis   High grade non-Hodgkin lymphoma (HCC)   05/01/2020 -  Chemotherapy   Inpatient R-EPOCH every 3 weeks for 6 cycles starting 05/01/20 with intrathecal prophylactic treatment       CURRENT THERAPY:  Inpatient R-EPOCHq3weeksfor 6  cycles starting 05/01/20 with intrathecal prophylactic treatment  INTERVAL HISTORY:  John [name] is here for a follow up. He presents to  the clinic with his wife. He notes his 2nd cycle chemo went well. He notes he is active 60-70% at home. He denies fever or chills or pain. He has manageable BM and uses Senakot. He feels he has recovered enough to proceed with C3. He notes recent blood transfusion helped.    REVIEW OF SYSTEMS:   Constitutional: Denies fevers, chills or abnormal weight loss Eyes: Denies blurriness of vision Ears, nose, mouth, throat, and face: Denies mucositis or sore throat Respiratory: Denies cough, dyspnea or wheezes Cardiovascular: Denies palpitation, chest discomfort or lower extremity swelling Gastrointestinal:  Denies nausea, heartburn or change in bowel habits Skin: Denies abnormal skin rashes Lymphatics: Denies new lymphadenopathy or easy bruising Neurological:Denies numbness, tingling or new weaknesses Behavioral/Psych: Mood is stable, no new changes  All other systems were reviewed with the patient and are negative.  MEDICAL HISTORY:  Past Medical History:  Diagnosis Date  . Cancer ([place])   . Goals of care, counseling/discussion 05/06/2020  . Hypertension   . Knee pain, right     SURGICAL HISTORY: Past Surgical History:  Procedure Laterality Date  . FOOT SURGERY    . IR IMAGING GUIDED PORT INSERTION  05/28/2020  . TONSILLECTOMY      I have reviewed the social history and family history with the patient and they are unchanged from previous note.  ALLERGIES:  has No Known Allergies.  MEDICATIONS:  Current Outpatient Medications  Medication Sig Dispense Refill  . lidocaine-prilocaine (EMLA) cream Apply 1 application topically as needed. 30 g 0  . allopurinol (ZYLOPRIM) 100 MG tablet Take 1 tablet (100 mg total) by mouth daily. 30 tablet 0  . Ascorbic Acid (VITAMIN C WITH ROSE HIPS) 1000 MG tablet Take 1,000 mg by mouth daily.    Marland Kitchen aspirin EC 81 MG tablet Take 81 mg by mouth daily.    . diphenhydrAMINE (BENADRYL) 25 mg capsule Take 1 capsule (25 mg total) by mouth at bedtime as  needed for sleep. (Patient not taking: Reported on 05/28/2020) 30 capsule 0  . docusate sodium (COLACE) 100 MG capsule Take 200 mg by mouth 2 (two) times daily as needed for mild constipation.     Marland Kitchen doxazosin (CARDURA) 4 MG tablet Take 4 mg by mouth daily.    Marland Kitchen HYDROcodone-acetaminophen (NORCO/VICODIN) 5-325 MG tablet Take 1 tablet by mouth every 6 (six) hours as needed for moderate pain. (Patient taking differently: Take 0.5-1 tablets by mouth every 6 (six) hours as needed for moderate pain.) 30 tablet 0  . magic mouthwash w/lidocaine SOLN Take 15 mLs by mouth 4 (four) times daily as needed for mouth pain. 400 mL 0  . melatonin 3 MG TABS tablet Take 2 tablets (6 mg total) by mouth at bedtime as needed (sleep). (Patient taking differently: Take 3 mg by mouth at bedtime.) 30 tablet 0  . metoprolol tartrate (LOPRESSOR) 25 MG tablet Take 0.5 tablets (12.5 mg total) by mouth 2 (two) times daily. 30 tablet 0  . Multiple Vitamins-Minerals (MULTIVITAMIN PO) Take 1 tablet by mouth daily.    . Nutritional Supplements (BOOST MAX PROTEIN PO) Take 237 mLs by mouth in the morning and at bedtime. Chocolate    . Omega-3 Fatty Acids (OMEGA 3 PO) Take 1 tablet by mouth daily.    . ondansetron (ZOFRAN) 8 MG tablet Take 1 tablet (8 mg total) by mouth every  8 (eight) hours as needed for nausea or vomiting. 20 tablet 0  . pantoprazole (PROTONIX) 40 MG tablet Take 1 tablet (40 mg total) by mouth daily. 30 tablet 3  . potassium chloride SA (KLOR-CON) 20 MEQ tablet Take 1 tablet (20 mEq total) by mouth daily. 5 tablet 0  . prochlorperazine (COMPAZINE) 10 MG tablet Take 1 tablet (10 mg total) by mouth every 6 (six) hours as needed for nausea or vomiting. 30 tablet 0  . senna-docusate (SENOKOT-S) 8.6-50 MG tablet Take 2 tablets by mouth 2 (two) times daily as needed for mild constipation.    . simethicone (MYLICON) 80 MG chewable tablet Chew 1 tablet (80 mg total) by mouth 4 (four) times daily as needed for flatulence.  (Patient not taking: Reported on 05/28/2020) 30 tablet 0  . Sodium Chloride-Sodium Bicarb (SODIUM BICARBONATE/SODIUM CHLORIDE) SOLN 1 application by Mouth Rinse route as needed for dry mouth or mouth pain.     No current facility-administered medications for this visit.    PHYSICAL EXAMINATION: ECOG PERFORMANCE STATUS: 2 - Symptomatic, <50% confined to bed  Vitals:   06/15/20 1018  BP: 127/64  Pulse: (!) 105  Resp: 17  Temp: 98.4 F (36.9 C)  SpO2: 100%   Filed Weights   06/15/20 1018  Weight: 197 lb 9.6 oz (89.6 kg)    Due to COVID19 we will limit examination to appearance. Patient had no complaints.  GENERAL:alert, no distress and comfortable SKIN: skin color normal, no rashes or significant lesions EYES: normal, Conjunctiva are pink and non-injected, sclera clear  NEURO: alert & oriented x 3 with fluent speech  LABORATORY DATA:  I have reviewed the data as listed CBC Latest Ref Rng & Units 06/15/2020 06/07/2020 06/04/2020  WBC 4.0 - 10.5 K/uL 11.3(H) 6.9 6.0  Hemoglobin 13.0 - 17.0 g/dL 10.7(L) 7.7(L) 8.7(L)  Hematocrit 39.0 - 52.0 % 31.1(L) 22.3(L) 25.0(L)  Platelets 150 - 400 K/uL 133(L) 90(L) 137(L)     CMP Latest Ref Rng & Units 06/15/2020 06/07/2020 06/04/2020  Glucose 70 - 99 mg/dL 142(H) 140(H) 131(H)  BUN 8 - 23 mg/dL 18 15 22   Creatinine 0.61 - 1.24 mg/dL 0.95 0.84 0.90  Sodium 135 - 145 mmol/L 138 135 135  Potassium 3.5 - 5.1 mmol/L 3.8 3.3(L) 3.6  Chloride 98 - 111 mmol/L 106 107 105  CO2 22 - 32 mmol/L 23 24 24   Calcium 8.9 - 10.3 mg/dL 9.0 8.4(L) 8.7(L)  Total Protein 6.5 - 8.1 g/dL 6.0(L) 5.7(L) 5.7(L)  Total Bilirubin 0.3 - 1.2 mg/dL 0.6 0.6 1.0  Alkaline Phos 38 - 126 U/L 111 114 111  AST 15 - 41 U/L 21 25 18   ALT 0 - 44 U/L 30 48(H) 40      RADIOGRAPHIC STUDIES: I have personally reviewed the radiological images as listed and agreed with the findings in the report. No results found.   ASSESSMENT & PLAN:  John [name] is a 75 y.o. male  with    1.Diffuse large B cell lymphoma, ABC Subtype, stage IV  -After worsening fatigue pt presented to ED. 04/20/20 CT CAP showedlargemediastinal mass and diffuseliver masses, and diffuse retroperitoneal lymphadenopathy. -Liver biopsy from 04/23/20 shows diffuse large B-Cell lymphoma,ABC subtype, which carries a worse prognosis. Given his liver metastasis and LN involvement, this is stage IV. His lymphoma FISH panel isnegative fordouble hit lymphoma, I discussed with pt today  -s/p cycle 1R-EPOCH11/23/21-11/27/2021.Given hishigh risk for CNS involvement, will giveintrathecal treatmentwith methotrexateprophylacticallywith C2.Plan to scan him after C3with PET. -  He tolerated C2 treatment better and has recovered well. He is able to remain active and eat adequately.  -Labs reviewed, WBC 11.3, Hg 10.7, plt 133K. Overall he is fine to proceed with C3 and hospital admission on 1/10. He is agreeable.  -I discussed he is on immunosuppression chemo with Rituxan he is approved for anti-COVID prevention treatment. He is interested.  -F/u in 3 weeks with PET results.   2. Pancytopenia, secondary to chemo -He was given GCSF after Cycle 1.He still had pancytopenia with neutropenic fever. Hewas treated as inpatient and required blood and platelet transfusion.  -He required blood transfusion on 06/14/20. Labs reviewed today with WBC 11.3, Hg 10.7, plt 133K (06/15/20)  3. Fatigue, Low appetite, and significant weight loss -secondary to #1 and chemotherapy -We will request inpatient nutrition consult and PT -His energy was better with C2 and has recovered well. Weight and appetite stable.    PLAN: -Lab reviewed, adequate to proceed to cycle 3 chemotherapy EPOCH as inpt on 1/10 at same dose as cycle 2 (20% dose reduction) -Intrathecal chemotherapy methotrexate on day 2 or 3 -Lab, flush, Rituxan and anti-COVID treatment 1/17  -Lab, flush, f/u and blood transfusion on 1/21 if Hg<8.0 -F/u  with me in 3 weeks with lab, flush and PET a few days before.    No problem-specific Assessment & Plan notes found for this encounter.   Orders Placed This Encounter  Procedures  . NM PET Image Initial (PI) Skull Base To Thigh    Standing Status:   Future    Standing Expiration Date:   06/15/2021    Order Specific Question:   If indicated for the ordered procedure, I authorize the administration of a radiopharmaceutical per Radiology protocol    Answer:   Yes    Order Specific Question:   Preferred imaging location?    Answer:   [facility]   All questions were answered. The patient knows to call the clinic with any problems, questions or concerns. No barriers to learning was detected. The total time spent in the appointment was 30 minutes.     Truitt Merle, MD 06/15/2020   I, Joslyn Devon, am acting as scribe for Truitt Merle, MD.   I have reviewed the above documentation for accuracy and completeness, and I agree with the above.

## 2020-06-15 ENCOUNTER — Other Ambulatory Visit: Payer: Self-pay

## 2020-06-15 ENCOUNTER — Inpatient Hospital Stay (HOSPITAL_BASED_OUTPATIENT_CLINIC_OR_DEPARTMENT_OTHER): Payer: Medicare HMO | Admitting: Hematology

## 2020-06-15 ENCOUNTER — Encounter: Payer: Self-pay | Admitting: Hematology

## 2020-06-15 ENCOUNTER — Inpatient Hospital Stay: Payer: Medicare HMO

## 2020-06-15 VITALS — BP 127/64 | HR 105 | Temp 98.4°F | Resp 17 | Ht 71.0 in | Wt 197.6 lb

## 2020-06-15 DIAGNOSIS — Z95828 Presence of other vascular implants and grafts: Secondary | ICD-10-CM

## 2020-06-15 DIAGNOSIS — C8338 Diffuse large B-cell lymphoma, lymph nodes of multiple sites: Secondary | ICD-10-CM | POA: Diagnosis not present

## 2020-06-15 DIAGNOSIS — C8339 Diffuse large B-cell lymphoma, extranodal and solid organ sites: Secondary | ICD-10-CM

## 2020-06-15 LAB — CBC WITH DIFFERENTIAL (CANCER CENTER ONLY)
Abs Immature Granulocytes: 0.86 10*3/uL — ABNORMAL HIGH (ref 0.00–0.07)
Basophils Absolute: 0.2 10*3/uL — ABNORMAL HIGH (ref 0.0–0.1)
Basophils Relative: 1 %
Eosinophils Absolute: 0.1 10*3/uL (ref 0.0–0.5)
Eosinophils Relative: 1 %
HCT: 31.1 % — ABNORMAL LOW (ref 39.0–52.0)
Hemoglobin: 10.7 g/dL — ABNORMAL LOW (ref 13.0–17.0)
Immature Granulocytes: 8 %
Lymphocytes Relative: 3 %
Lymphs Abs: 0.3 10*3/uL — ABNORMAL LOW (ref 0.7–4.0)
MCH: 30.2 pg (ref 26.0–34.0)
MCHC: 34.4 g/dL (ref 30.0–36.0)
MCV: 87.9 fL (ref 80.0–100.0)
Monocytes Absolute: 1.1 10*3/uL — ABNORMAL HIGH (ref 0.1–1.0)
Monocytes Relative: 9 %
Neutro Abs: 8.8 10*3/uL — ABNORMAL HIGH (ref 1.7–7.7)
Neutrophils Relative %: 78 %
Platelet Count: 133 10*3/uL — ABNORMAL LOW (ref 150–400)
RBC: 3.54 MIL/uL — ABNORMAL LOW (ref 4.22–5.81)
RDW: 16.2 % — ABNORMAL HIGH (ref 11.5–15.5)
WBC Count: 11.3 10*3/uL — ABNORMAL HIGH (ref 4.0–10.5)
nRBC: 0.3 % — ABNORMAL HIGH (ref 0.0–0.2)

## 2020-06-15 LAB — CMP (CANCER CENTER ONLY)
ALT: 30 U/L (ref 0–44)
AST: 21 U/L (ref 15–41)
Albumin: 3.1 g/dL — ABNORMAL LOW (ref 3.5–5.0)
Alkaline Phosphatase: 111 U/L (ref 38–126)
Anion gap: 9 (ref 5–15)
BUN: 18 mg/dL (ref 8–23)
CO2: 23 mmol/L (ref 22–32)
Calcium: 9 mg/dL (ref 8.9–10.3)
Chloride: 106 mmol/L (ref 98–111)
Creatinine: 0.95 mg/dL (ref 0.61–1.24)
GFR, Estimated: >60 mL/min (ref >60)
Glucose, Bld: 142 mg/dL — ABNORMAL HIGH (ref 70–99)
Potassium: 3.8 mmol/L (ref 3.5–5.1)
Sodium: 138 mmol/L (ref 135–145)
Total Bilirubin: 0.6 mg/dL (ref 0.3–1.2)
Total Protein: 6 g/dL — ABNORMAL LOW (ref 6.5–8.1)

## 2020-06-15 MED ORDER — LIDOCAINE-PRILOCAINE 2.5-2.5 % EX CREA: 1.0000 "application " | TOPICAL_CREAM | CUTANEOUS | 0 refills | Status: DC | PRN

## 2020-06-15 MED ORDER — HEPARIN SOD (PORK) LOCK FLUSH 100 UNIT/ML IV SOLN
500.0000 [IU] | Freq: Once | INTRAVENOUS | Status: AC
  Administered 2020-06-15: 500 [IU] via INTRAVENOUS
  Filled 2020-06-15: qty 5

## 2020-06-15 MED ORDER — SODIUM CHLORIDE 0.9% FLUSH
10.0000 mL | Freq: Once | INTRAVENOUS | Status: AC
  Administered 2020-06-15: 10 mL via INTRAVENOUS
  Filled 2020-06-15: qty 10

## 2020-06-15 NOTE — Patient Instructions (Signed)

## 2020-06-18 ENCOUNTER — Encounter (HOSPITAL_COMMUNITY): Payer: Self-pay | Admitting: Hematology

## 2020-06-18 ENCOUNTER — Inpatient Hospital Stay (HOSPITAL_COMMUNITY)
Admission: RE | Admit: 2020-06-18 | Discharge: 2020-06-22 | DRG: 847 | Disposition: A | Discharge status: Home / Self Care | Payer: Medicare HMO | Source: Ambulatory Visit | Attending: Hematology | Admitting: Hematology

## 2020-06-18 DIAGNOSIS — R634 Abnormal weight loss: Secondary | ICD-10-CM | POA: Diagnosis present

## 2020-06-18 DIAGNOSIS — C8338 Diffuse large B-cell lymphoma, lymph nodes of multiple sites: Secondary | ICD-10-CM | POA: Diagnosis present

## 2020-06-18 DIAGNOSIS — C8330 Diffuse large B-cell lymphoma, unspecified site: Secondary | ICD-10-CM | POA: Diagnosis not present

## 2020-06-18 DIAGNOSIS — I10 Essential (primary) hypertension: Secondary | ICD-10-CM | POA: Diagnosis present

## 2020-06-18 DIAGNOSIS — T451X5A Adverse effect of antineoplastic and immunosuppressive drugs, initial encounter: Secondary | ICD-10-CM | POA: Diagnosis present

## 2020-06-18 DIAGNOSIS — R5383 Other fatigue: Secondary | ICD-10-CM | POA: Diagnosis present

## 2020-06-18 DIAGNOSIS — D6481 Anemia due to antineoplastic chemotherapy: Secondary | ICD-10-CM | POA: Diagnosis present

## 2020-06-18 DIAGNOSIS — M25561 Pain in right knee: Secondary | ICD-10-CM | POA: Diagnosis present

## 2020-06-18 DIAGNOSIS — Z20822 Contact with and (suspected) exposure to covid-19: Secondary | ICD-10-CM | POA: Diagnosis present

## 2020-06-18 DIAGNOSIS — Z79899 Other long term (current) drug therapy: Secondary | ICD-10-CM

## 2020-06-18 DIAGNOSIS — N400 Enlarged prostate without lower urinary tract symptoms: Secondary | ICD-10-CM | POA: Diagnosis present

## 2020-06-18 DIAGNOSIS — K5903 Drug induced constipation: Secondary | ICD-10-CM | POA: Diagnosis present

## 2020-06-18 DIAGNOSIS — Z5111 Encounter for antineoplastic chemotherapy: Principal | ICD-10-CM

## 2020-06-18 LAB — CBC WITH DIFFERENTIAL/PLATELET
Abs Immature Granulocytes: 0.24 10*3/uL — ABNORMAL HIGH (ref 0.00–0.07)
Basophils Absolute: 0.1 10*3/uL (ref 0.0–0.1)
Basophils Relative: 1 %
Eosinophils Absolute: 0.1 10*3/uL (ref 0.0–0.5)
Eosinophils Relative: 1 %
HCT: 29.4 % — ABNORMAL LOW (ref 39.0–52.0)
Hemoglobin: 9.8 g/dL — ABNORMAL LOW (ref 13.0–17.0)
Immature Granulocytes: 3 %
Lymphocytes Relative: 6 %
Lymphs Abs: 0.5 10*3/uL — ABNORMAL LOW (ref 0.7–4.0)
MCH: 30.2 pg (ref 26.0–34.0)
MCHC: 33.3 g/dL (ref 30.0–36.0)
MCV: 90.7 fL (ref 80.0–100.0)
Monocytes Absolute: 1.2 10*3/uL — ABNORMAL HIGH (ref 0.1–1.0)
Monocytes Relative: 14 %
Neutro Abs: 6.6 10*3/uL (ref 1.7–7.7)
Neutrophils Relative %: 75 %
Platelets: 125 10*3/uL — ABNORMAL LOW (ref 150–400)
RBC: 3.24 MIL/uL — ABNORMAL LOW (ref 4.22–5.81)
RDW: 16.4 % — ABNORMAL HIGH (ref 11.5–15.5)
WBC: 8.7 10*3/uL (ref 4.0–10.5)
nRBC: 0.2 % (ref 0.0–0.2)

## 2020-06-18 LAB — COMPREHENSIVE METABOLIC PANEL
ALT: 22 U/L (ref 0–44)
AST: 19 U/L (ref 15–41)
Albumin: 3.1 g/dL — ABNORMAL LOW (ref 3.5–5.0)
Alkaline Phosphatase: 81 U/L (ref 38–126)
Anion gap: 8 (ref 5–15)
BUN: 18 mg/dL (ref 8–23)
CO2: 24 mmol/L (ref 22–32)
Calcium: 8.5 mg/dL — ABNORMAL LOW (ref 8.9–10.3)
Chloride: 105 mmol/L (ref 98–111)
Creatinine, Ser: 0.82 mg/dL (ref 0.61–1.24)
GFR, Estimated: >60 mL/min (ref >60)
Glucose, Bld: 121 mg/dL — ABNORMAL HIGH (ref 70–99)
Potassium: 3.8 mmol/L (ref 3.5–5.1)
Sodium: 137 mmol/L (ref 135–145)
Total Bilirubin: 0.5 mg/dL (ref 0.3–1.2)
Total Protein: 5.5 g/dL — ABNORMAL LOW (ref 6.5–8.1)

## 2020-06-18 LAB — LACTATE DEHYDROGENASE: LDH: 216 U/L — ABNORMAL HIGH (ref 98–192)

## 2020-06-18 LAB — SARS CORONAVIRUS 2 (TAT 6-24 HRS): SARS Coronavirus 2: NEGATIVE

## 2020-06-18 LAB — URIC ACID: Uric Acid, Serum: 5.4 mg/dL (ref 3.7–8.6)

## 2020-06-18 MED ORDER — SODIUM BICARBONATE/SODIUM CHLORIDE MOUTHWASH
1.0000 "application " | Freq: Four times a day (QID) | OROMUCOSAL | Status: DC
  Administered 2020-06-18 – 2020-06-22 (×14): 1 via OROMUCOSAL
  Filled 2020-06-18: qty 1000

## 2020-06-18 MED ORDER — SIMETHICONE 80 MG PO CHEW: 80.0000 mg | CHEWABLE_TABLET | Freq: Four times a day (QID) | ORAL | Status: DC | PRN

## 2020-06-18 MED ORDER — ADULT MULTIVITAMIN W/MINERALS CH
1.0000 | ORAL_TABLET | Freq: Every day | ORAL | Status: DC
  Administered 2020-06-19 – 2020-06-22 (×4): 1 via ORAL
  Filled 2020-06-18 (×4): qty 1

## 2020-06-18 MED ORDER — ALLOPURINOL 100 MG PO TABS
100.0000 mg | ORAL_TABLET | Freq: Every day | ORAL | Status: DC
  Administered 2020-06-19 – 2020-06-22 (×4): 100 mg via ORAL
  Filled 2020-06-18 (×5): qty 1

## 2020-06-18 MED ORDER — PREDNISONE 20 MG PO TABS: 60.0000 mg | ORAL_TABLET | Freq: Two times a day (BID) | ORAL | Status: DC

## 2020-06-18 MED ORDER — DOCUSATE SODIUM 100 MG PO CAPS
200.0000 mg | ORAL_CAPSULE | Freq: Two times a day (BID) | ORAL | Status: DC
  Administered 2020-06-18 – 2020-06-22 (×8): 200 mg via ORAL
  Filled 2020-06-18 (×9): qty 2

## 2020-06-18 MED ORDER — PROCHLORPERAZINE MALEATE 10 MG PO TABS: 10.0000 mg | ORAL_TABLET | Freq: Four times a day (QID) | ORAL | Status: DC | PRN

## 2020-06-18 MED ORDER — PANTOPRAZOLE SODIUM 40 MG PO TBEC
40.0000 mg | DELAYED_RELEASE_TABLET | Freq: Every day | ORAL | Status: DC
  Administered 2020-06-19 – 2020-06-22 (×4): 40 mg via ORAL
  Filled 2020-06-18 (×4): qty 1

## 2020-06-18 MED ORDER — DOXAZOSIN MESYLATE 4 MG PO TABS
4.0000 mg | ORAL_TABLET | Freq: Every day | ORAL | Status: DC
  Administered 2020-06-19 – 2020-06-22 (×4): 4 mg via ORAL
  Filled 2020-06-18 (×4): qty 1

## 2020-06-18 MED ORDER — SODIUM CHLORIDE 0.9 % IV SOLN
Freq: Once | INTRAVENOUS | Status: AC
  Administered 2020-06-18: 18 mg via INTRAVENOUS
  Filled 2020-06-18: qty 4

## 2020-06-18 MED ORDER — HYDROCODONE-ACETAMINOPHEN 5-325 MG PO TABS: 0.5000 | ORAL_TABLET | Freq: Four times a day (QID) | ORAL | Status: DC | PRN

## 2020-06-18 MED ORDER — LIDOCAINE-PRILOCAINE 2.5-2.5 % EX CREA: 1.0000 "application " | TOPICAL_CREAM | CUTANEOUS | Status: DC | PRN

## 2020-06-18 MED ORDER — SODIUM CHLORIDE 0.9% FLUSH
10.0000 mL | Freq: Two times a day (BID) | INTRAVENOUS | Status: DC
  Administered 2020-06-18 – 2020-06-21 (×6): 10 mL

## 2020-06-18 MED ORDER — VINCRISTINE SULFATE CHEMO INJECTION 1 MG/ML
Freq: Once | INTRAVENOUS | Status: AC
  Filled 2020-06-18: qty 8

## 2020-06-18 MED ORDER — DIPHENHYDRAMINE HCL 25 MG PO CAPS: 25.0000 mg | ORAL_CAPSULE | Freq: Every evening | ORAL | Status: DC | PRN

## 2020-06-18 MED ORDER — POTASSIUM CHLORIDE CRYS ER 20 MEQ PO TBCR
20.0000 meq | EXTENDED_RELEASE_TABLET | Freq: Every day | ORAL | Status: DC
  Administered 2020-06-19 – 2020-06-22 (×4): 20 meq via ORAL
  Filled 2020-06-18 (×4): qty 1

## 2020-06-18 MED ORDER — SENNOSIDES-DOCUSATE SODIUM 8.6-50 MG PO TABS
2.0000 | ORAL_TABLET | Freq: Two times a day (BID) | ORAL | Status: DC
  Administered 2020-06-18 – 2020-06-22 (×8): 2 via ORAL
  Filled 2020-06-18 (×8): qty 2

## 2020-06-18 MED ORDER — SODIUM CHLORIDE 0.9% FLUSH: 10.0000 mL | INTRAVENOUS | Status: DC | PRN

## 2020-06-18 MED ORDER — MAGIC MOUTHWASH W/LIDOCAINE
15.0000 mL | Freq: Four times a day (QID) | ORAL | Status: DC | PRN
  Filled 2020-06-18: qty 15

## 2020-06-18 MED ORDER — SODIUM CHLORIDE 0.9 % IV SOLN: INTRAVENOUS | Status: DC

## 2020-06-18 MED ORDER — ONDANSETRON HCL 8 MG PO TABS: 8.0000 mg | ORAL_TABLET | Freq: Three times a day (TID) | ORAL | Status: DC | PRN

## 2020-06-18 MED ORDER — CHLORHEXIDINE GLUCONATE CLOTH 2 % EX PADS
6.0000 | MEDICATED_PAD | Freq: Every day | CUTANEOUS | Status: DC
  Administered 2020-06-18 – 2020-06-22 (×5): 6 via TOPICAL

## 2020-06-18 MED ORDER — METOPROLOL TARTRATE 25 MG PO TABS
12.5000 mg | ORAL_TABLET | Freq: Two times a day (BID) | ORAL | Status: DC
  Administered 2020-06-18 – 2020-06-22 (×8): 12.5 mg via ORAL
  Filled 2020-06-18 (×8): qty 1

## 2020-06-18 MED ORDER — MELATONIN 3 MG PO TABS
3.0000 mg | ORAL_TABLET | Freq: Every day | ORAL | Status: DC
  Administered 2020-06-18 – 2020-06-21 (×4): 3 mg via ORAL
  Filled 2020-06-18 (×4): qty 1

## 2020-06-18 NOTE — Progress Notes (Signed)
Chaplain engaged in initial visit with John [name] and his wife, Pamala Hurry.  Chaplain introduced herself and offered support.  They shared that they have been married over thirty years.  Chaplain affirmed and celebrated their partnership. Pamala Hurry shared that John [name] comes every three weeks to the unit and jokingly gets off the elevator and says, "Welcome home."   Chaplain will follow-up and continue to offer support.    06/18/20 1016  Clinical Encounter Type  Visited With Patient and family together  Visit Type Initial  Advance Directives (For Healthcare)  Does Patient Have a Medical Advance Directive? Yes  Does patient want to make changes to medical advance directive? No - Patient declined  Type of Advance Directive Living will  Copy of Living Will in Chart? No - copy requested  Would patient like information on creating a medical advance directive? No - Patient declined  [place]  Does Patient Have a Mental Health Advance Directive? No  Would patient like information on creating a mental health advance directive? No - Patient declined

## 2020-06-18 NOTE — H&P (Addendum)
[place]  [phone] [phone]   MEDICAL ONCOLOGY - ADMISSION H&P I have seen the patient, examined him and agree with the documentation as follows  Reason for Admission: Cycle #3 EPOCH-R  HPI: John [name] is a 75 year old Caucasian male, with past medical history of hypertension, BPH.  He was admitted to [facility] in November 2021 due to fatigue and constipation.  He was found to have severe hypercalcemia and CT scan showed a large mediastinal and liver mass with diffuse retroperitoneal lymphadenopathy.  He received IV fluids and pamidronate for treatment of hypercalcemia with resolution.  He also had hyperuricemia and was treated with rasburicase and then placed on allopurinol.  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  The patient was discharged home with a course of prednisone x5 days. Once biopsy resulted, it was recommended him to begin inpatient EPOCH-R for treatment of his lymphoma.    The patient is seen this morning for admission to the hospital.  He denies recent fevers, chills, chest pain, cough, shortness of breath.  He denies headaches and dizziness.  Denies mucositis.  Reports improvement in his appetite.  Denies abdominal pain, nausea, vomiting.  Bowels are moving without difficulty.  Lower extremity edema has resolved.  Denies peripheral neuropathy he patient is seen today for admission for cycle #3 of EPOCH-R.   Past Medical History:  Diagnosis Date  . Cancer ([place])   . Goals of care, counseling/discussion 05/06/2020  . Hypertension   . Knee pain, right     Socioeconomic History  . Marital status: Married    Spouse name: Not on file  . Number of children: 0  . Years of education: Not on file  . Highest education level: Not on file  Occupational History  . Occupation: retired Customer service manager   Tobacco Use  . Smoking  status: Never Smoker  . Smokeless tobacco: Never Used  Vaping Use  . Vaping Use: Never used  Substance and Sexual Activity  . Alcohol use: No  . Drug use: No  . Sexual activity: Not on file  Other Topics Concern  . Not on file  Social History Narrative  . Not on file   Social Determinants of Health   Financial Resource Strain: Not on file  Food Insecurity: Not on file  Transportation Needs: Not on file  Physical Activity: Not on file  Stress: Not on file  Social Connections: Not on file  Intimate Partner Violence: Not on file  :  Review of Systems: A comprehensive 14 point review of systems was negative except as noted in the HPI.  Exam: Patient Vitals for the past 24 hrs: BP 105/63 (BP Location: Right Arm)   Pulse 93   Temp 98.2 F (36.8 C) (Oral)   Resp 16   SpO2 100%   General:  well-nourished in no acute distress.   Eyes:  no scleral icterus.   ENT:  There were no oropharyngeal lesions.     Respiratory: lungs were clear bilaterally without wheezing or crackles.   Cardiovascular:  Regular rate and rhythm, S1/S2, without murmur, rub or gallop.  No pedal edema bilaterally. GI:  abdomen was soft, flat, nontender, nondistended, without organomegaly.   Musculoskeletal: Strength symmetrical in the upper and lower extremities. Skin exam was without echymosis, petichae.   Neuro exam was nonfocal. Patient was alert and oriented.  Attention was good.  Language was appropriate.  Mood was normal without depression.  Speech was not pressured.  Thought content was not tangential.    CBC    Component Value Date/Time   WBC 8.7 06/18/2020 0939   RBC 3.24 (L) 06/18/2020 0939   HGB 9.8 (L) 06/18/2020 0939   HGB 10.7 (L) 06/15/2020 1006   HCT 29.4 (L) 06/18/2020 0939   PLT 125 (L) 06/18/2020 0939   PLT 133 (L) 06/15/2020 1006   MCV 90.7 06/18/2020 0939   MCH 30.2 06/18/2020 0939   MCHC 33.3 06/18/2020 0939   RDW 16.4 (H) 06/18/2020 0939   LYMPHSABS 0.5 (L) 06/18/2020 0939    MONOABS 1.2 (H) 06/18/2020 0939   EOSABS 0.1 06/18/2020 0939   BASOSABS 0.1 06/18/2020 0939   CMP Latest Ref Rng & Units 06/15/2020 06/07/2020 06/04/2020  Glucose 70 - 99 mg/dL 142(H) 140(H) 131(H)  BUN 8 - 23 mg/dL 18 15 22   Creatinine 0.61 - 1.24 mg/dL 0.95 0.84 0.90  Sodium 135 - 145 mmol/L 138 135 135  Potassium 3.5 - 5.1 mmol/L 3.8 3.3(L) 3.6  Chloride 98 - 111 mmol/L 106 107 105  CO2 22 - 32 mmol/L 23 24 24   Calcium 8.9 - 10.3 mg/dL 9.0 8.4(L) 8.7(L)  Total Protein 6.5 - 8.1 g/dL 6.0(L) 5.7(L) 5.7(L)  Total Bilirubin 0.3 - 1.2 mg/dL 0.6 0.6 1.0  Alkaline Phos 38 - 126 U/L 111 114 111  AST 15 - 41 U/L 21 25 18   ALT 0 - 44 U/L 30 48(H) 40    DG FLUORO GUIDED NEEDLE PLC ASPIRATION/INJECTION LOC  Result Date: 05/30/2020 CLINICAL DATA:  Intrathecal methotrexate injection for lymphoma. EXAM: FLUOROSCOPICALLY GUIDED LUMBAR PUNCTURE FOR INTRATHECAL CHEMOTHERAPY FLUOROSCOPY TIME:  1 minutes 0 seconds. PROCEDURE: Informed consent was obtained from the patient prior to the procedure, including potential complications of headache, allergy, and pain. With the patient prone, the lower back was prepped with Betadine. 1% Lidocaine was used for local anesthesia. Lumbar puncture was performed at the L3-4 level using a gauge needle with return of initially blood tinged but then clearCSF. 12 mg of methotrexate was injected into the subarachnoid space. The patient tolerated the procedure well without apparent complication. IMPRESSION: Successful intrathecal methotrexate injection under fluoroscopy. Electronically Signed   By: Lorin Picket M.D.   On: 05/30/2020 13:10   IR IMAGING GUIDED PORT INSERTION  Result Date: 05/28/2020 INDICATION: Recent diagnosis of large B-cell lymphoma. In need of durable intravenous access for chemotherapy administration. EXAM: IMPLANTED PORT A CATH PLACEMENT WITH ULTRASOUND AND FLUOROSCOPIC GUIDANCE COMPARISON:  CT the chest, abdomen and pelvis-04/20/2020 MEDICATIONS:  Ancef 2 gm IV; The antibiotic was administered within an appropriate time interval prior to skin puncture. ANESTHESIA/SEDATION: Moderate (conscious) sedation was employed during this procedure. A total of Versed 4 mg and Fentanyl 100 mcg was administered intravenously. Moderate Sedation Time: 27 minutes. The patient's level of consciousness and vital signs were monitored continuously by radiology nursing throughout the procedure under my direct supervision. CONTRAST:  None FLUOROSCOPY TIME:  18 seconds (8 mGy) COMPLICATIONS: None immediate. PROCEDURE: The procedure, risks, benefits, and alternatives were explained to the patient. Questions regarding the procedure were encouraged and answered. The patient understands and consents to the procedure. The right neck and chest were prepped with chlorhexidine in a sterile fashion, and a sterile drape was applied covering the operative field. Maximum barrier sterile technique with sterile gowns and gloves were used for the procedure. A timeout was performed prior to the initiation of the procedure. Local  anesthesia was provided with 1% lidocaine with epinephrine. After creating a small venotomy incision, a micropuncture kit was utilized to access the internal jugular vein. Real-time ultrasound guidance was utilized for vascular access including the acquisition of a permanent ultrasound image documenting patency of the accessed vessel. The microwire was utilized to measure appropriate catheter length. A subcutaneous port pocket was then created along the upper chest wall utilizing a combination of sharp and blunt dissection. The pocket was irrigated with sterile saline. A single lumen "Slim" sized power injectable port was chosen for placement. The 8 Fr catheter was tunneled from the port pocket site to the venotomy incision. The port was placed in the pocket. The external catheter was trimmed to appropriate length. At the venotomy, an 8 Fr peel-away sheath was placed over a  guidewire under fluoroscopic guidance. The catheter was then placed through the sheath and the sheath was removed. Final catheter positioning was confirmed and documented with a fluoroscopic spot radiograph. The port was accessed with a Huber needle, aspirated and flushed with heparinized saline. The venotomy site was closed with an interrupted 4-0 Vicryl suture. The port pocket incision was closed with interrupted 2-0 Vicryl suture. The skin was opposed with a running subcuticular 4-0 Vicryl suture. Dermabond and Steri-strips were applied to both incisions. Dressings were applied. The patient tolerated the procedure well without immediate post procedural complication. FINDINGS: After catheter placement, the tip lies within the superior cavoatrial junction. The catheter aspirates and flushes normally and is ready for immediate use. The port a Catheter was left accessed for the patient's impending hospitalization and initiation of chemotherapy. IMPRESSION: Successful placement of a right internal jugular approach power injectable Port-A-Cath. The catheter is ready for immediate use. Electronically Signed   By: Sandi Mariscal M.D.   On: 05/28/2020 10:55     DG FLUORO GUIDED NEEDLE PLC ASPIRATION/INJECTION LOC  Result Date: 05/30/2020 CLINICAL DATA:  Intrathecal methotrexate injection for lymphoma. EXAM: FLUOROSCOPICALLY GUIDED LUMBAR PUNCTURE FOR INTRATHECAL CHEMOTHERAPY FLUOROSCOPY TIME:  1 minutes 0 seconds. PROCEDURE: Informed consent was obtained from the patient prior to the procedure, including potential complications of headache, allergy, and pain. With the patient prone, the lower back was prepped with Betadine. 1% Lidocaine was used for local anesthesia. Lumbar puncture was performed at the L3-4 level using a gauge needle with return of initially blood tinged but then clearCSF. 12 mg of methotrexate was injected into the subarachnoid space. The patient tolerated the procedure well without apparent  complication. IMPRESSION: Successful intrathecal methotrexate injection under fluoroscopy. Electronically Signed   By: Lorin Picket M.D.   On: 05/30/2020 13:10   IR IMAGING GUIDED PORT INSERTION  Result Date: 05/28/2020 INDICATION: Recent diagnosis of large B-cell lymphoma. In need of durable intravenous access for chemotherapy administration. EXAM: IMPLANTED PORT A CATH PLACEMENT WITH ULTRASOUND AND FLUOROSCOPIC GUIDANCE COMPARISON:  CT the chest, abdomen and pelvis-04/20/2020 MEDICATIONS: Ancef 2 gm IV; The antibiotic was administered within an appropriate time interval prior to skin puncture. ANESTHESIA/SEDATION: Moderate (conscious) sedation was employed during this procedure. A total of Versed 4 mg and Fentanyl 100 mcg was administered intravenously. Moderate Sedation Time: 27 minutes. The patient's level of consciousness and vital signs were monitored continuously by radiology nursing throughout the procedure under my direct supervision. CONTRAST:  None FLUOROSCOPY TIME:  18 seconds (8 mGy) COMPLICATIONS: None immediate. PROCEDURE: The procedure, risks, benefits, and alternatives were explained to the patient. Questions regarding the procedure were encouraged and answered. The patient understands and consents to the procedure.  The right neck and chest were prepped with chlorhexidine in a sterile fashion, and a sterile drape was applied covering the operative field. Maximum barrier sterile technique with sterile gowns and gloves were used for the procedure. A timeout was performed prior to the initiation of the procedure. Local anesthesia was provided with 1% lidocaine with epinephrine. After creating a small venotomy incision, a micropuncture kit was utilized to access the internal jugular vein. Real-time ultrasound guidance was utilized for vascular access including the acquisition of a permanent ultrasound image documenting patency of the accessed vessel. The microwire was utilized to measure  appropriate catheter length. A subcutaneous port pocket was then created along the upper chest wall utilizing a combination of sharp and blunt dissection. The pocket was irrigated with sterile saline. A single lumen "Slim" sized power injectable port was chosen for placement. The 8 Fr catheter was tunneled from the port pocket site to the venotomy incision. The port was placed in the pocket. The external catheter was trimmed to appropriate length. At the venotomy, an 8 Fr peel-away sheath was placed over a guidewire under fluoroscopic guidance. The catheter was then placed through the sheath and the sheath was removed. Final catheter positioning was confirmed and documented with a fluoroscopic spot radiograph. The port was accessed with a Huber needle, aspirated and flushed with heparinized saline. The venotomy site was closed with an interrupted 4-0 Vicryl suture. The port pocket incision was closed with interrupted 2-0 Vicryl suture. The skin was opposed with a running subcuticular 4-0 Vicryl suture. Dermabond and Steri-strips were applied to both incisions. Dressings were applied. The patient tolerated the procedure well without immediate post procedural complication. FINDINGS: After catheter placement, the tip lies within the superior cavoatrial junction. The catheter aspirates and flushes normally and is ready for immediate use. The port a Catheter was left accessed for the patient's impending hospitalization and initiation of chemotherapy. IMPRESSION: Successful placement of a right internal jugular approach power injectable Port-A-Cath. The catheter is ready for immediate use. Electronically Signed   By: Sandi Mariscal M.D.   On: 05/28/2020 10:55   Assessment and Plan:   1.  Stage IVa diffuse large B-cell lymphoma, ABC subtype 2.  Fatigue secondary #1 3.  Anemia secondary to recent chemotherapy 4.  Recent significant weight loss  -CBC from today has been reviewed.  CMET, LDH, uric acid are pending.   Will review CMET, LDH, uric acid once available and plan to proceed with 1 of cycle 3 of his chemotherapy today as planned. -COVID-19 test pending. -We will monitor labs closely for signs of tumor lysis syndrome.  A daily CBC with differential, CMET, uric acid have been ordered. -He is also at high risk for CNS involvement due to high risk features and will plan to begin intrathecal methotrexate with this cycle.  Will ask for radiology to administer this on day 2 or day 3 of the cycle of chemotherapy. -Aspirin has been placed on hold in anticipation of intrathecal methotrexate.  Prophylactic Lovenox has not been ordered due to impending procedure.  Will reevaluate post procedure.  SCDs for DVT prophylaxis. -We will continue home bowel regimen including Colace and Senokot. -He has Zofran and Compazine as needed for nausea and vomiting. -Continue allopurinol and other home medications. -Sodium bicarbonate mouth rinses and Magic mouthwash have been ordered. -Will order intrathecal methotrexate administration by radiology on day 2 or day 3 of this cycle of chemotherapy. -Dietitian consult. -We will request physical therapy consult due to generalized fatigue. -  He will need Neulasta and Rituxan on 06/25/2020 and a follow-up visit in approximately 3 weeks with a PET scan just prior to this visit.  I have resent a scheduling message today.  Mikey Bussing, DNP, AGPCNP-BC, AOCNP  Addendum  I have seen the patient, examined him. I agree with the assessment and and plan and have edited the notes.   John [name] is clinically doing well, lab reviewed, will proceed with cycle 3 EPOCH today with 20% dose reduction (same as last cycle except Etoposide slightly up). Since he is getting dexa 69m daily as premeds, will not give oral prednisone. Plan to do IT MTX tomorrow by radiology, order placed. Will hold on prophylactic Lovenox today, and start tomorrow. Continue home meds, we reviewed constipation management, he  will continue senna-s twice daily.   YTruitt Merle 06/18/2020

## 2020-06-18 NOTE — Progress Notes (Signed)
Dosing and calculation of doxorubicin, etoposide and vincristine verified by Aldean Baker ,RN.

## 2020-06-18 NOTE — Progress Notes (Signed)
No prednisone this cycle since pt is receiving dex daily per Dr Burr Medico

## 2020-06-18 NOTE — Progress Notes (Signed)
Etoposide dose is increased to 40mg /m2 this cycle per Dr Burr Medico

## 2020-06-19 ENCOUNTER — Inpatient Hospital Stay (HOSPITAL_COMMUNITY): Payer: Medicare HMO

## 2020-06-19 DIAGNOSIS — C8338 Diffuse large B-cell lymphoma, lymph nodes of multiple sites: Secondary | ICD-10-CM | POA: Diagnosis not present

## 2020-06-19 DIAGNOSIS — K5903 Drug induced constipation: Secondary | ICD-10-CM | POA: Diagnosis not present

## 2020-06-19 LAB — CBC WITH DIFFERENTIAL/PLATELET
Abs Immature Granulocytes: 0.19 K/uL — ABNORMAL HIGH (ref 0.00–0.07)
Basophils Absolute: 0.1 K/uL (ref 0.0–0.1)
Basophils Relative: 0 %
Eosinophils Absolute: 0 K/uL (ref 0.0–0.5)
Eosinophils Relative: 0 %
HCT: 29.8 % — ABNORMAL LOW (ref 39.0–52.0)
Hemoglobin: 10 g/dL — ABNORMAL LOW (ref 13.0–17.0)
Immature Granulocytes: 2 %
Lymphocytes Relative: 3 %
Lymphs Abs: 0.4 K/uL — ABNORMAL LOW (ref 0.7–4.0)
MCH: 30.3 pg (ref 26.0–34.0)
MCHC: 33.6 g/dL (ref 30.0–36.0)
MCV: 90.3 fL (ref 80.0–100.0)
Monocytes Absolute: 0.9 K/uL (ref 0.1–1.0)
Monocytes Relative: 7 %
Neutro Abs: 11.4 K/uL — ABNORMAL HIGH (ref 1.7–7.7)
Neutrophils Relative %: 88 %
Platelets: 132 K/uL — ABNORMAL LOW (ref 150–400)
RBC: 3.3 MIL/uL — ABNORMAL LOW (ref 4.22–5.81)
RDW: 16.3 % — ABNORMAL HIGH (ref 11.5–15.5)
WBC: 13 K/uL — ABNORMAL HIGH (ref 4.0–10.5)
nRBC: 0.2 % (ref 0.0–0.2)

## 2020-06-19 LAB — COMPREHENSIVE METABOLIC PANEL
ALT: 25 U/L (ref 0–44)
AST: 25 U/L (ref 15–41)
Albumin: 3.2 g/dL — ABNORMAL LOW (ref 3.5–5.0)
Alkaline Phosphatase: 79 U/L (ref 38–126)
Anion gap: 9 (ref 5–15)
BUN: 19 mg/dL (ref 8–23)
CO2: 22 mmol/L (ref 22–32)
Calcium: 8.6 mg/dL — ABNORMAL LOW (ref 8.9–10.3)
Chloride: 105 mmol/L (ref 98–111)
Creatinine, Ser: 0.82 mg/dL (ref 0.61–1.24)
GFR, Estimated: >60 mL/min (ref >60)
Glucose, Bld: 146 mg/dL — ABNORMAL HIGH (ref 70–99)
Potassium: 3.9 mmol/L (ref 3.5–5.1)
Sodium: 136 mmol/L (ref 135–145)
Total Bilirubin: 0.4 mg/dL (ref 0.3–1.2)
Total Protein: 5.6 g/dL — ABNORMAL LOW (ref 6.5–8.1)

## 2020-06-19 LAB — URIC ACID: Uric Acid, Serum: 5.6 mg/dL (ref 3.7–8.6)

## 2020-06-19 MED ORDER — POLYETHYLENE GLYCOL 3350 17 G PO PACK
17.0000 g | PACK | Freq: Every day | ORAL | Status: DC
  Administered 2020-06-20 – 2020-06-22 (×3): 17 g via ORAL
  Filled 2020-06-19 (×3): qty 1

## 2020-06-19 MED ORDER — POLYETHYLENE GLYCOL 3350 17 G PO PACK
17.0000 g | PACK | Freq: Every day | ORAL | Status: DC | PRN
  Administered 2020-06-19: 17 g via ORAL
  Filled 2020-06-19: qty 1

## 2020-06-19 MED ORDER — SODIUM CHLORIDE 0.9 % IV SOLN
Freq: Once | INTRAVENOUS | Status: AC
  Administered 2020-06-19: 8 mg via INTRAVENOUS
  Filled 2020-06-19: qty 4

## 2020-06-19 MED ORDER — SODIUM CHLORIDE 0.9% FLUSH: 10.0000 mL | Freq: Two times a day (BID) | INTRAVENOUS | Status: DC

## 2020-06-19 MED ORDER — SODIUM CHLORIDE 0.9% FLUSH: 10.0000 mL | INTRAVENOUS | Status: DC | PRN

## 2020-06-19 MED ORDER — VINCRISTINE SULFATE CHEMO INJECTION 1 MG/ML
Freq: Once | INTRAVENOUS | Status: AC
  Filled 2020-06-19: qty 8

## 2020-06-19 MED ORDER — SODIUM CHLORIDE (PF) 0.9 % IJ SOLN
Freq: Once | INTRAMUSCULAR | Status: AC
  Filled 2020-06-19: qty 0.48

## 2020-06-19 NOTE — Progress Notes (Signed)
Dosing and calculation of doxorubicin, etoposide and vincristine verified by [facility], RN.

## 2020-06-19 NOTE — Progress Notes (Signed)
John [name]   DOB:May 24, 1946   SN#:[id number]   LPF#:[id number]   Oncology follow up   Subjective: pt is tolerating first day chemo well, no N/V or other side effects. He had BM yesterday but not today yet. VS stable, he is afebrile. He was ambulating in his room this morning    Objective:  Vitals:   06/18/20 2141 06/19/20 0644  BP: 130/74 126/72  Pulse: 100 87  Resp: 16 16  Temp: 98.2 F (36.8 C) 97.7 F (36.5 C)  SpO2: 96% 97%    There is no height or weight on file to calculate BMI.  Intake/Output Summary (Last 24 hours) at 06/19/2020 0818 Last data filed at 06/19/2020 0300 Gross per 24 hour  Intake 480 ml  Output --  Net 480 ml     Sclerae unicteric  Oropharynx clear  No peripheral adenopathy  Lungs clear -- no rales or rhonchi  Heart regular rate and rhythm  Abdomen benign  MSK no focal spinal tenderness, no peripheral edema  Neuro nonfocal    CBG (last 3)  No results for input(s): GLUCAP in the last 72 hours.   Labs:   Urine Studies No results for input(s): UHGB, CRYS in the last 72 hours.  Invalid input(s): UACOL, UAPR, USPG, UPH, UTP, UGL, UKET, UBIL, UNIT, UROB, ULEU, UEPI, UWBC, URBC, UBAC, CAST, UCOM, [place]  Basic Metabolic Panel: Recent Labs  Lab 06/15/20 1006 06/18/20 0939 06/19/20 0530  NA 138 137 136  K 3.8 3.8 3.9  CL 106 105 105  CO2 23 24 22   GLUCOSE 142* 121* 146*  BUN 18 18 19   CREATININE 0.95 0.82 0.82  CALCIUM 9.0 8.5* 8.6*   GFR Estimated Creatinine Clearance: 86.7 mL/min (by C-G formula based on SCr of 0.82 mg/dL). Liver Function Tests: Recent Labs  Lab 06/15/20 1006 06/18/20 0939 06/19/20 0530  AST 21 19 25   ALT 30 22 25   ALKPHOS 111 81 79  BILITOT 0.6 0.5 0.4  PROT 6.0* 5.5* 5.6*  ALBUMIN 3.1* 3.1* 3.2*   No results for input(s): LIPASE, AMYLASE in the last 168 hours. No results for input(s): AMMONIA in the last 168 hours. Coagulation profile No results for input(s): INR, PROTIME in the last 168  hours.  CBC: Recent Labs  Lab 06/15/20 1006 06/18/20 0939 06/19/20 0530  WBC 11.3* 8.7 13.0*  NEUTROABS 8.8* 6.6 11.4*  HGB 10.7* 9.8* 10.0*  HCT 31.1* 29.4* 29.8*  MCV 87.9 90.7 90.3  PLT 133* 125* 132*   Cardiac Enzymes: No results for input(s): CKTOTAL, CKMB, CKMBINDEX, TROPONINI in the last 168 hours. BNP: Invalid input(s): POCBNP CBG: No results for input(s): GLUCAP in the last 168 hours. D-Dimer No results for input(s): DDIMER in the last 72 hours. Hgb A1c No results for input(s): HGBA1C in the last 72 hours. Lipid Profile No results for input(s): CHOL, HDL, LDLCALC, TRIG, CHOLHDL, LDLDIRECT in the last 72 hours. Thyroid function studies No results for input(s): TSH, T4TOTAL, T3FREE, THYROIDAB in the last 72 hours.  Invalid input(s): FREET3 Anemia work up No results for input(s): VITAMINB12, FOLATE, FERRITIN, TIBC, IRON, RETICCTPCT in the last 72 hours. Microbiology Recent Results (from the past 240 hour(s))  SARS CORONAVIRUS 2 (TAT 6-24 HRS) Nasopharyngeal Nasopharyngeal Swab     Status: None   Collection Time: 06/18/20  8:29 AM   Specimen: Nasopharyngeal Swab  Result Value Ref Range Status   SARS Coronavirus 2 NEGATIVE NEGATIVE Final    Comment: (NOTE) SARS-CoV-2 target nucleic acids are NOT DETECTED.  The SARS-CoV-2 RNA is generally detectable in upper and lower respiratory specimens during the acute phase of infection. Negative results do not preclude SARS-CoV-2 infection, do not rule out co-infections with other pathogens, and should not be used as the sole basis for treatment or other patient management decisions. Negative results must be combined with clinical observations, patient history, and epidemiological information. The expected result is Negative.  Fact Sheet for Patients: SugarRoll.be  Fact Sheet for Healthcare Providers: https://www.woods-mathews.com/  This test is not yet approved or cleared by  the [place] FDA and  has been authorized for detection and/or diagnosis of SARS-CoV-2 by FDA under an Emergency Use Authorization (EUA). This EUA will remain  in effect (meaning this test can be used) for the duration of the COVID-19 declaration under Se ction 564(b)(1) of the Act, 21 U.S.C. section 360bbb-3(b)(1), unless the authorization is terminated or revoked sooner.  Performed at [facility], [place] [address], [place], [place] [address]       Studies:  No results found.  Assessment: 75 y.o. admitted for chemo   1.  Stage IVa diffuse large B-cell lymphoma, ABC subtype 2.  Fatigue secondary #1 3.  Anemia secondary to recent chemotherapy 4.  weight loss  Plan:  -pt is doing well overall -plan to have IT MTX by radiology today (scheduled for 11am) -lab reviewed, proceed second day chemo today -add on Miralax today for constipation as needed, he will continue Senna-s  -I encourage him to ambulate, and [place] compression device for DVT prophylasix (will start lovenox tonight -spoke with his nurse and pharmacy    Truitt Merle, MD 06/19/2020  8:18 AM

## 2020-06-19 NOTE — Plan of Care (Signed)
Plan of care reviewed. Progressing as expected towards identified goals.

## 2020-06-20 ENCOUNTER — Other Ambulatory Visit: Payer: Self-pay

## 2020-06-20 DIAGNOSIS — C8330 Diffuse large B-cell lymphoma, unspecified site: Secondary | ICD-10-CM

## 2020-06-20 LAB — COMPREHENSIVE METABOLIC PANEL
ALT: 29 U/L (ref 0–44)
AST: 26 U/L (ref 15–41)
Albumin: 3.1 g/dL — ABNORMAL LOW (ref 3.5–5.0)
Alkaline Phosphatase: 73 U/L (ref 38–126)
Anion gap: 10 (ref 5–15)
BUN: 25 mg/dL — ABNORMAL HIGH (ref 8–23)
CO2: 23 mmol/L (ref 22–32)
Calcium: 8.4 mg/dL — ABNORMAL LOW (ref 8.9–10.3)
Chloride: 106 mmol/L (ref 98–111)
Creatinine, Ser: 0.78 mg/dL (ref 0.61–1.24)
GFR, Estimated: >60 mL/min (ref >60)
Glucose, Bld: 114 mg/dL — ABNORMAL HIGH (ref 70–99)
Potassium: 3.8 mmol/L (ref 3.5–5.1)
Sodium: 139 mmol/L (ref 135–145)
Total Bilirubin: 0.5 mg/dL (ref 0.3–1.2)
Total Protein: 5.5 g/dL — ABNORMAL LOW (ref 6.5–8.1)

## 2020-06-20 LAB — CBC WITH DIFFERENTIAL/PLATELET
Abs Immature Granulocytes: 0.09 10*3/uL — ABNORMAL HIGH (ref 0.00–0.07)
Basophils Absolute: 0 10*3/uL (ref 0.0–0.1)
Basophils Relative: 0 %
Eosinophils Absolute: 0 10*3/uL (ref 0.0–0.5)
Eosinophils Relative: 0 %
HCT: 30 % — ABNORMAL LOW (ref 39.0–52.0)
Hemoglobin: 10.1 g/dL — ABNORMAL LOW (ref 13.0–17.0)
Immature Granulocytes: 1 %
Lymphocytes Relative: 3 %
Lymphs Abs: 0.3 10*3/uL — ABNORMAL LOW (ref 0.7–4.0)
MCH: 30.3 pg (ref 26.0–34.0)
MCHC: 33.7 g/dL (ref 30.0–36.0)
MCV: 90.1 fL (ref 80.0–100.0)
Monocytes Absolute: 1.1 10*3/uL — ABNORMAL HIGH (ref 0.1–1.0)
Monocytes Relative: 11 %
Neutro Abs: 8.6 10*3/uL — ABNORMAL HIGH (ref 1.7–7.7)
Neutrophils Relative %: 85 %
Platelets: 127 10*3/uL — ABNORMAL LOW (ref 150–400)
RBC: 3.33 MIL/uL — ABNORMAL LOW (ref 4.22–5.81)
RDW: 16.7 % — ABNORMAL HIGH (ref 11.5–15.5)
WBC: 10.2 10*3/uL (ref 4.0–10.5)
nRBC: 0.2 % (ref 0.0–0.2)

## 2020-06-20 LAB — CYTOLOGY - NON PAP

## 2020-06-20 LAB — URIC ACID: Uric Acid, Serum: 5.5 mg/dL (ref 3.7–8.6)

## 2020-06-20 MED ORDER — VINCRISTINE SULFATE CHEMO INJECTION 1 MG/ML
Freq: Once | INTRAVENOUS | Status: AC
  Filled 2020-06-20: qty 8

## 2020-06-20 MED ORDER — PROSOURCE PLUS PO LIQD
30.0000 mL | Freq: Two times a day (BID) | ORAL | Status: DC
  Administered 2020-06-20 – 2020-06-22 (×4): 30 mL via ORAL
  Filled 2020-06-20 (×4): qty 30

## 2020-06-20 MED ORDER — ENOXAPARIN SODIUM 40 MG/0.4ML ~~LOC~~ SOLN
40.0000 mg | SUBCUTANEOUS | Status: DC
  Administered 2020-06-20 – 2020-06-22 (×3): 40 mg via SUBCUTANEOUS
  Filled 2020-06-20 (×3): qty 0.4

## 2020-06-20 MED ORDER — ONDANSETRON HCL 40 MG/20ML IJ SOLN
Freq: Once | INTRAMUSCULAR | Status: AC
  Administered 2020-06-20: 18 mg via INTRAVENOUS
  Filled 2020-06-20: qty 4

## 2020-06-20 NOTE — Progress Notes (Signed)
Initial Nutrition Assessment  RD working remotely.  DOCUMENTATION CODES:   Not applicable  INTERVENTION:  - will order Magic Cup with dinner meals, each supplement provides 290 kcal and 9 grams of protein. - will order 30 ml Prosource Plus BID, each supplement provides 100 kcal and 15 grams protein.  - weigh patient today.  - complete NFPE when feasible.   NUTRITION DIAGNOSIS:   Increased nutrient needs related to acute illness,chronic illness,cancer and cancer related treatments as evidenced by estimated needs.  GOAL:   Patient will meet greater than or equal to 90% of their needs  MONITOR:   PO intake,Supplement acceptance,Labs,Weight trends  REASON FOR ASSESSMENT:   Consult Assessment of nutrition requirement/status  ASSESSMENT:   75 year-old male with medical history of HTN, BPH, and stage 4 large B-cell lymphoma which was dx in 04/2020. He was admitted for initiation of cycle 3 of EPOCH-R.  Unable to reach patient by phone. Patient has been consuming 50-100% of meals since admission.   He was seen by a [place] RD on 12/23 at which time he was diagnosed with moderate malnutrition as evidenced by weight loss and moderate fat depletions. He was refusing oral nutrition supplements at that time. Will trial supplements outlined above and make adjustments at follow-up if needed.  He has not been weighed since 1/7 at which time he weighed 197 lb. This indicates weight had been stable x3 weeks. Weight on 05/18/20 was documented as 214 lb. This indicates 17 lb weight loss (8% body weight) in the past 1 month; significant for time frame.    Labs reviewed; BUN: 25 mg/dl, Ca: 8.4 mg/dl.  Medications reviewed; 200 mg colace BID, 1 tablet multivitamin with minerals/day, 40 mg oral protonix/day, 20 mEq Klor-Con/day, 2 tablets senokot BID.    NUTRITION - FOCUSED PHYSICAL EXAM:  unable to complete at this time.   Diet Order:   Diet Order            Diet regular Room service  appropriate? Yes; Fluid consistency: Thin  Diet effective now                 EDUCATION NEEDS:   Not appropriate for education at this time  Skin:  Skin Assessment: Reviewed RN Assessment  Last BM:  PTA/unknown  Height:   Ht Readings from Last 1 Encounters:  06/15/20 5\' 11"  (1.803 m)    Weight:   Wt Readings from Last 1 Encounters:  06/15/20 89.6 kg     Estimated Nutritional Needs:  Kcal:  2200-2400 kcal Protein:  110-125 grams Fluid:  >/= 2.5 L/day      Jarome Matin, MS, RD, LDN, CNSC Inpatient Clinical Dietitian RD pager # available in [facility]  After hours/weekend pager # available in [facility]

## 2020-06-20 NOTE — Progress Notes (Signed)
John [name]   DOB:06-05-1946   WE#:[id number]   ELF#:[id number]   Oncology follow up   Subjective: pt did very well with IT yesterday, no headache or other side effects. He is tolerating chemo well also, no N/V or other complains. Slept well last night, and had BM after miralax.     Objective:  Vitals:   06/19/20 2046 06/20/20 0538  BP: 110/64 128/71  Pulse: 88 82  Resp: 16 16  Temp: 98.1 F (36.7 C) 97.8 F (36.6 C)  SpO2: 96% 96%    There is no height or weight on file to calculate BMI.  Intake/Output Summary (Last 24 hours) at 06/20/2020 0752 Last data filed at 06/20/2020 0311 Gross per 24 hour  Intake 1771.17 ml  Output --  Net 1771.17 ml     Sclerae unicteric  Oropharynx clear  No peripheral adenopathy  Lungs clear -- no rales or rhonchi  Heart regular rate and rhythm  Abdomen benign  MSK no focal spinal tenderness, no peripheral edema  Neuro nonfocal    CBG (last 3)  No results for input(s): GLUCAP in the last 72 hours.   Labs:   Urine Studies No results for input(s): UHGB, CRYS in the last 72 hours.  Invalid input(s): UACOL, UAPR, USPG, UPH, UTP, UGL, UKET, UBIL, UNIT, UROB, ULEU, UEPI, UWBC, URBC, UBAC, CAST, [place], [place]  Basic Metabolic Panel: Recent Labs  Lab 06/15/20 1006 06/18/20 0939 06/19/20 0530 06/20/20 0615  NA 138 137 136 139  K 3.8 3.8 3.9 3.8  CL 106 105 105 106  CO2 23 24 22 23   GLUCOSE 142* 121* 146* 114*  BUN 18 18 19  25*  CREATININE 0.95 0.82 0.82 0.78  CALCIUM 9.0 8.5* 8.6* 8.4*   GFR Estimated Creatinine Clearance: 88.9 mL/min (by C-G formula based on SCr of 0.78 mg/dL). Liver Function Tests: Recent Labs  Lab 06/15/20 1006 06/18/20 0939 06/19/20 0530 06/20/20 0615  AST 21 19 25 26   ALT 30 22 25 29   ALKPHOS 111 81 79 73  BILITOT 0.6 0.5 0.4 0.5  PROT 6.0* 5.5* 5.6* 5.5*  ALBUMIN 3.1* 3.1* 3.2* 3.1*   No results for input(s): LIPASE, AMYLASE in the last 168 hours. No results for input(s): AMMONIA in the last 168  hours. Coagulation profile No results for input(s): INR, PROTIME in the last 168 hours.  CBC: Recent Labs  Lab 06/15/20 1006 06/18/20 0939 06/19/20 0530 06/20/20 0615  WBC 11.3* 8.7 13.0* 10.2  NEUTROABS 8.8* 6.6 11.4* 8.6*  HGB 10.7* 9.8* 10.0* 10.1*  HCT 31.1* 29.4* 29.8* 30.0*  MCV 87.9 90.7 90.3 90.1  PLT 133* 125* 132* 127*   Cardiac Enzymes: No results for input(s): CKTOTAL, CKMB, CKMBINDEX, TROPONINI in the last 168 hours. BNP: Invalid input(s): POCBNP CBG: No results for input(s): GLUCAP in the last 168 hours. D-Dimer No results for input(s): DDIMER in the last 72 hours. Hgb A1c No results for input(s): HGBA1C in the last 72 hours. Lipid Profile No results for input(s): CHOL, HDL, LDLCALC, TRIG, CHOLHDL, LDLDIRECT in the last 72 hours. Thyroid function studies No results for input(s): TSH, T4TOTAL, T3FREE, THYROIDAB in the last 72 hours.  Invalid input(s): FREET3 Anemia work up No results for input(s): VITAMINB12, FOLATE, FERRITIN, TIBC, IRON, RETICCTPCT in the last 72 hours. Microbiology Recent Results (from the past 240 hour(s))  SARS CORONAVIRUS 2 (TAT 6-24 HRS) Nasopharyngeal Nasopharyngeal Swab     Status: None   Collection Time: 06/18/20  8:29 AM   Specimen: Nasopharyngeal Swab  Result Value Ref Range Status   SARS Coronavirus 2 NEGATIVE NEGATIVE Final    Comment: (NOTE) SARS-CoV-2 target nucleic acids are NOT DETECTED.  The SARS-CoV-2 RNA is generally detectable in upper and lower respiratory specimens during the acute phase of infection. Negative results do not preclude SARS-CoV-2 infection, do not rule out co-infections with other pathogens, and should not be used as the sole basis for treatment or other patient management decisions. Negative results must be combined with clinical observations, patient history, and epidemiological information. The expected result is Negative.  Fact Sheet for  Patients: SugarRoll.be  Fact Sheet for Healthcare Providers: https://www.woods-mathews.com/  This test is not yet approved or cleared by the [place] FDA and  has been authorized for detection and/or diagnosis of SARS-CoV-2 by FDA under an Emergency Use Authorization (EUA). This EUA will remain  in effect (meaning this test can be used) for the duration of the COVID-19 declaration under Se ction 564(b)(1) of the Act, 21 U.S.C. section 360bbb-3(b)(1), unless the authorization is terminated or revoked sooner.  Performed at [facility], [place] [address] [place], [place] [address]       Studies:  DG FLUORO GUIDED LOC OF NEEDLE/CATH TIP FOR SPINAL INJECT LT  Result Date: 06/19/2020 CLINICAL DATA:  Large B-cell lymphoma. EXAM: FLUOROSCOPICALLY GUIDED LUMBAR PUNCTURE FOR INTRATHECAL CHEMOTHERAPY FLUOROSCOPY TIME:  0 minutes and 30 seconds. PROCEDURE: Informed consent was obtained from the patient prior to the procedure, including potential complications of headache, bleeding, infection and damage normal structures. With the patient prone when appropriate site was marked on the patient's skin at L4-5. The lower back was prepped with Betadine. 1% Lidocaine was used for local anesthesia. Lumbar puncture was performed at the L4-5 level using a 20 gauge spinal needle with return of clearCSF. 3 cc of CSF was removed for appropriate laboratory evaluation/cytology. 12 mg of methotrexate was injected into the subarachnoid space. The patient tolerated the procedure well without apparent complication. IMPRESSION: Fluoroscopic guided lumbar puncture and installation of intrathecal methotrexate. Electronically Signed   By: Marijo Sanes M.D.   On: 06/19/2020 14:06    Assessment: 75 y.o. admitted for chemo   1.  Stage IVa diffuse large B-cell lymphoma, ABC subtype 2.  Fatigue secondary #1 3.  Anemia secondary to recent chemotherapy 4.  weight  loss  Plan:  -pt is doing well overall -lab reviewed, proceed third day chemo today -add lovenox for DVT prophylaxis today  -spoke with his nurse  Truitt Merle, MD 06/20/2020  7:51 AM

## 2020-06-21 DIAGNOSIS — K5903 Drug induced constipation: Secondary | ICD-10-CM | POA: Diagnosis not present

## 2020-06-21 DIAGNOSIS — C8338 Diffuse large B-cell lymphoma, lymph nodes of multiple sites: Secondary | ICD-10-CM | POA: Diagnosis not present

## 2020-06-21 LAB — COMPREHENSIVE METABOLIC PANEL
ALT: 35 U/L (ref 0–44)
AST: 32 U/L (ref 15–41)
Albumin: 2.9 g/dL — ABNORMAL LOW (ref 3.5–5.0)
Alkaline Phosphatase: 67 U/L (ref 38–126)
Anion gap: 8 (ref 5–15)
BUN: 31 mg/dL — ABNORMAL HIGH (ref 8–23)
CO2: 23 mmol/L (ref 22–32)
Calcium: 8.4 mg/dL — ABNORMAL LOW (ref 8.9–10.3)
Chloride: 108 mmol/L (ref 98–111)
Creatinine, Ser: 0.72 mg/dL (ref 0.61–1.24)
GFR, Estimated: >60 mL/min (ref >60)
Glucose, Bld: 90 mg/dL (ref 70–99)
Potassium: 3.8 mmol/L (ref 3.5–5.1)
Sodium: 139 mmol/L (ref 135–145)
Total Bilirubin: 0.7 mg/dL (ref 0.3–1.2)
Total Protein: 5.1 g/dL — ABNORMAL LOW (ref 6.5–8.1)

## 2020-06-21 LAB — CBC WITH DIFFERENTIAL/PLATELET
Abs Immature Granulocytes: 0.04 10*3/uL (ref 0.00–0.07)
Basophils Absolute: 0.1 10*3/uL (ref 0.0–0.1)
Basophils Relative: 1 %
Eosinophils Absolute: 0 10*3/uL (ref 0.0–0.5)
Eosinophils Relative: 0 %
HCT: 29.1 % — ABNORMAL LOW (ref 39.0–52.0)
Hemoglobin: 9.7 g/dL — ABNORMAL LOW (ref 13.0–17.0)
Immature Granulocytes: 1 %
Lymphocytes Relative: 5 %
Lymphs Abs: 0.3 10*3/uL — ABNORMAL LOW (ref 0.7–4.0)
MCH: 30.4 pg (ref 26.0–34.0)
MCHC: 33.3 g/dL (ref 30.0–36.0)
MCV: 91.2 fL (ref 80.0–100.0)
Monocytes Absolute: 0.5 10*3/uL (ref 0.1–1.0)
Monocytes Relative: 9 %
Neutro Abs: 5 10*3/uL (ref 1.7–7.7)
Neutrophils Relative %: 84 %
Platelets: 134 10*3/uL — ABNORMAL LOW (ref 150–400)
RBC: 3.19 MIL/uL — ABNORMAL LOW (ref 4.22–5.81)
RDW: 16.5 % — ABNORMAL HIGH (ref 11.5–15.5)
WBC: 6 10*3/uL (ref 4.0–10.5)
nRBC: 0 % (ref 0.0–0.2)

## 2020-06-21 LAB — URIC ACID: Uric Acid, Serum: 5 mg/dL (ref 3.7–8.6)

## 2020-06-21 MED ORDER — SODIUM CHLORIDE 0.9 % IV SOLN
Freq: Once | INTRAVENOUS | Status: AC
  Administered 2020-06-22: 36 mg via INTRAVENOUS
  Filled 2020-06-21: qty 8

## 2020-06-21 MED ORDER — SODIUM CHLORIDE 0.9 % IV SOLN
600.0000 mg/m2 | Freq: Once | INTRAVENOUS | Status: AC
  Administered 2020-06-22: 1280 mg via INTRAVENOUS
  Filled 2020-06-21: qty 64

## 2020-06-21 MED ORDER — VINCRISTINE SULFATE CHEMO INJECTION 1 MG/ML
Freq: Once | INTRAVENOUS | Status: AC
  Filled 2020-06-21: qty 8

## 2020-06-21 MED ORDER — SODIUM CHLORIDE 0.9 % IV SOLN
Freq: Once | INTRAVENOUS | Status: AC
  Administered 2020-06-21: 8 mg via INTRAVENOUS
  Filled 2020-06-21: qty 4

## 2020-06-21 NOTE — Progress Notes (Signed)
Rapid Infusion Rituximab Pharmacist Evaluation  John [name] is a 75 y.o. male being treated with rituximab for DLBCL. This patient may be considered for RIR.   A pharmacist has verified the patient tolerated rituximab infusions per the [name] standard infusion protocol without grade 3-4 infusion reactions. The treatment plan will be updated to reflect RIR if the patient qualifies per the checklist below:   Age > 88 years old Yes   Clinically significant cardiovascular disease No   Circulating lymphocyte count < 5000/uL prior to cycle two Yes  Lab Results  Component Value Date   LYMPHSABS 0.3 (L) 06/21/2020    Prior documented grade 3-4 infusion reaction to rituximab No   Prior documented grade 1-2 infusion reaction to rituximab (If YES, Pharmacist will confirm with Physician if patient is still a candidate for RIR) No   Previous rituximab infusion within the past 6 months Yes   Treatment Plan updated orders to reflect RIR Yes    John [name] does meet the criteria for Rapid Infusion Rituximab. This patient is going to be switched to rapid infusion rituximab.   John [name] P 06/21/20 8:42 AM

## 2020-06-21 NOTE — Progress Notes (Signed)
Chemotherapy dosage and calculations checked and verified with [facility] RN.

## 2020-06-21 NOTE — Care Management Important Message (Signed)
Important Message  Patient Details IM Letter given to the Patient. Name: EASTIN SWING MRN: [id number] Date of Birth: [birth year]   Medicare Important Message Given:  Yes     Kerin Salen 06/21/2020, 10:13 AM

## 2020-06-21 NOTE — Progress Notes (Addendum)
John [name]   DOB:[birth year]   WJ#:[id number]   AOZ#:[id number]   Oncology follow up   Subjective: The patient continues to tolerate chemotherapy well overall.  He denies mucositis, nausea, vomiting.  Bowels are moving well.  Denies headaches.  He is ambulating in the hallway without any difficulty.  Objective:  Vitals:   06/20/20 2213 06/21/20 0620  BP: 134/67 120/77  Pulse: 82 70  Resp: 18 16  Temp: 98 F (36.7 C) 97.6 F (36.4 C)  SpO2: 99% 98%    Body mass index is 27.62 kg/m.  Intake/Output Summary (Last 24 hours) at 06/21/2020 1214 Last data filed at 06/21/2020 1025 Gross per 24 hour  Intake 1508.2 ml  Output -  Net 1508.2 ml     Sclerae unicteric  Oropharynx clear  No peripheral adenopathy  Lungs clear -- no rales or rhonchi  Heart regular rate and rhythm  Abdomen benign  MSK no focal spinal tenderness, no peripheral edema  Neuro nonfocal    CBG (last 3)  No results for input(s): GLUCAP in the last 72 hours.   Labs:   Urine Studies No results for input(s): UHGB, CRYS in the last 72 hours.  Invalid input(s): UACOL, UAPR, USPG, UPH, UTP, UGL, UKET, UBIL, UNIT, UROB, John [name], UEPI, UWBC, John [name], John [name]  Basic Metabolic Panel: Recent Labs  Lab 06/15/20 1006 06/18/20 0939 06/19/20 0530 06/20/20 0615 06/21/20 0556  NA 138 137 136 139 139  K 3.8 3.8 3.9 3.8 3.8  CL 106 105 105 106 108  CO2 23 24 22 23 23   GLUCOSE 142* 121* 146* 114* 90  BUN 18 18 19  25* 31*  CREATININE 0.95 0.82 0.82 0.78 0.72  CALCIUM 9.0 8.5* 8.6* 8.4* 8.4*   GFR Estimated Creatinine Clearance: 88.9 mL/min (by C-G formula based on SCr of 0.72 mg/dL). Liver Function Tests: Recent Labs  Lab 06/15/20 1006 06/18/20 0939 06/19/20 0530 06/20/20 0615 06/21/20 0556  AST 21 19 25 26  32  ALT 30 22 25 29  35  ALKPHOS 111 81 79 73 67  BILITOT 0.6 0.5 0.4 0.5 0.7  PROT 6.0* 5.5* 5.6* 5.5* 5.1*  ALBUMIN 3.1* 3.1* 3.2* 3.1* 2.9*   No results for input(s): LIPASE, AMYLASE  in the last 168 hours. No results for input(s): AMMONIA in the last 168 hours. Coagulation profile No results for input(s): INR, PROTIME in the last 168 hours.  CBC: Recent Labs  Lab 06/15/20 1006 06/18/20 0939 06/19/20 0530 06/20/20 0615 06/21/20 0556  WBC 11.3* 8.7 13.0* 10.2 6.0  NEUTROABS 8.8* 6.6 11.4* 8.6* 5.0  HGB 10.7* 9.8* 10.0* 10.1* 9.7*  HCT 31.1* 29.4* 29.8* 30.0* 29.1*  MCV 87.9 90.7 90.3 90.1 91.2  PLT 133* 125* 132* 127* 134*   Cardiac Enzymes: No results for input(s): CKTOTAL, CKMB, CKMBINDEX, TROPONINI in the last 168 hours. BNP: Invalid input(s): POCBNP CBG: No results for input(s): GLUCAP in the last 168 hours. D-Dimer No results for input(s): DDIMER in the last 72 hours. Hgb A1c No results for input(s): HGBA1C in the last 72 hours. Lipid Profile No results for input(s): CHOL, HDL, LDLCALC, TRIG, CHOLHDL, LDLDIRECT in the last 72 hours. Thyroid function studies No results for input(s): TSH, T4TOTAL, T3FREE, THYROIDAB in the last 72 hours.  Invalid input(s): FREET3 Anemia work up No results for input(s): VITAMINB12, FOLATE, FERRITIN, TIBC, IRON, RETICCTPCT in the last 72 hours. Microbiology Recent Results (from the past 240 hour(s))  SARS CORONAVIRUS 2 (TAT 6-24 HRS) Nasopharyngeal Nasopharyngeal Swab  Status: None   Collection Time: 06/18/20  8:29 AM   Specimen: Nasopharyngeal Swab  Result Value Ref Range Status   SARS Coronavirus 2 NEGATIVE NEGATIVE Final    Comment: (NOTE) SARS-CoV-2 target nucleic acids are NOT DETECTED.  The SARS-CoV-2 RNA is generally detectable in upper and lower respiratory specimens during the acute phase of infection. Negative results do not preclude SARS-CoV-2 infection, do not rule out co-infections with other pathogens, and should not be used as the sole basis for treatment or other patient management decisions. Negative results must be combined with clinical observations, patient history, and epidemiological  information. The expected result is Negative.  Fact Sheet for Patients: SugarRoll.be  Fact Sheet for Healthcare Providers: https://www.woods-mathews.com/  This test is not yet approved or cleared by the [place] FDA and  has been authorized for detection and/or diagnosis of SARS-CoV-2 by FDA under an Emergency Use Authorization (EUA). This EUA will remain  in effect (meaning this test can be used) for the duration of the COVID-19 declaration under Se ction 564(b)(1) of the Act, 21 U.S.C. section 360bbb-3(b)(1), unless the authorization is terminated or revoked sooner.  Performed at [facility], [place] [address], [place], [place] [address]       Studies:  DG FLUORO GUIDED LOC OF NEEDLE/CATH TIP FOR SPINAL INJECT LT  Result Date: 06/19/2020 CLINICAL DATA:  Large B-cell lymphoma. EXAM: FLUOROSCOPICALLY GUIDED LUMBAR PUNCTURE FOR INTRATHECAL CHEMOTHERAPY FLUOROSCOPY TIME:  0 minutes and 30 seconds. PROCEDURE: Informed consent was obtained from the patient prior to the procedure, including potential complications of headache, bleeding, infection and damage normal structures. With the patient prone when appropriate site was marked on the patient's skin at L4-5. The lower back was prepped with Betadine. 1% Lidocaine was used for local anesthesia. Lumbar puncture was performed at the L4-5 level using a 20 gauge spinal needle with return of clearCSF. 3 cc of CSF was removed for appropriate laboratory evaluation/cytology. 12 mg of methotrexate was injected into the subarachnoid space. The patient tolerated the procedure well without apparent complication. IMPRESSION: Fluoroscopic guided lumbar puncture and installation of intrathecal methotrexate. Electronically Signed   By: Marijo Sanes M.D.   On: 06/19/2020 14:06    Assessment: 75 y.o. admitted for chemo   1.  Stage IVa diffuse large B-cell lymphoma, ABC subtype 2.  Fatigue secondary #1 3.   Anemia secondary to recent chemotherapy 4.  weight loss  Plan:  -pt is doing well overall -Labs reviewed, proceed with day four of cycle three of his chemotherapy today as planned. -Continue home medications and bowel regimen -Continue Lovenox for DVT prophylaxis -spoke with his nurse -Anticipate discharge to home tomorrow once chemotherapy is complete.  John Bussing, NP 06/21/2020  12:14 PM   I have seen the patient, examined him. I agree with the assessment and and plan and have edited the notes.   Mr. Strutz is tolerating chemo very well. Lab appears to be stable, and adequate for chemo. Will proceed with day 4 chemo today. I again encouraged him to ambulate, and watch his BM.   Truitt Merle  06/21/2020

## 2020-06-22 DIAGNOSIS — C8338 Diffuse large B-cell lymphoma, lymph nodes of multiple sites: Secondary | ICD-10-CM | POA: Diagnosis not present

## 2020-06-22 LAB — CBC WITH DIFFERENTIAL/PLATELET
Abs Immature Granulocytes: 0.04 10*3/uL (ref 0.00–0.07)
Basophils Absolute: 0.1 10*3/uL (ref 0.0–0.1)
Basophils Relative: 1 %
Eosinophils Absolute: 0 10*3/uL (ref 0.0–0.5)
Eosinophils Relative: 0 %
HCT: 28.6 % — ABNORMAL LOW (ref 39.0–52.0)
Hemoglobin: 9.8 g/dL — ABNORMAL LOW (ref 13.0–17.0)
Immature Granulocytes: 1 %
Lymphocytes Relative: 7 %
Lymphs Abs: 0.3 10*3/uL — ABNORMAL LOW (ref 0.7–4.0)
MCH: 30.3 pg (ref 26.0–34.0)
MCHC: 34.3 g/dL (ref 30.0–36.0)
MCV: 88.5 fL (ref 80.0–100.0)
Monocytes Absolute: 0.2 10*3/uL (ref 0.1–1.0)
Monocytes Relative: 4 %
Neutro Abs: 4.1 10*3/uL (ref 1.7–7.7)
Neutrophils Relative %: 87 %
Platelets: 131 10*3/uL — ABNORMAL LOW (ref 150–400)
RBC: 3.23 MIL/uL — ABNORMAL LOW (ref 4.22–5.81)
RDW: 16.2 % — ABNORMAL HIGH (ref 11.5–15.5)
WBC: 4.7 10*3/uL (ref 4.0–10.5)
nRBC: 0 % (ref 0.0–0.2)

## 2020-06-22 LAB — COMPREHENSIVE METABOLIC PANEL
ALT: 37 U/L (ref 0–44)
AST: 30 U/L (ref 15–41)
Albumin: 3 g/dL — ABNORMAL LOW (ref 3.5–5.0)
Alkaline Phosphatase: 63 U/L (ref 38–126)
Anion gap: 10 (ref 5–15)
BUN: 28 mg/dL — ABNORMAL HIGH (ref 8–23)
CO2: 23 mmol/L (ref 22–32)
Calcium: 8.5 mg/dL — ABNORMAL LOW (ref 8.9–10.3)
Chloride: 106 mmol/L (ref 98–111)
Creatinine, Ser: 0.7 mg/dL (ref 0.61–1.24)
GFR, Estimated: >60 mL/min (ref >60)
Glucose, Bld: 96 mg/dL (ref 70–99)
Potassium: 3.5 mmol/L (ref 3.5–5.1)
Sodium: 139 mmol/L (ref 135–145)
Total Bilirubin: 0.9 mg/dL (ref 0.3–1.2)
Total Protein: 5.1 g/dL — ABNORMAL LOW (ref 6.5–8.1)

## 2020-06-22 LAB — URIC ACID: Uric Acid, Serum: 5.3 mg/dL (ref 3.7–8.6)

## 2020-06-22 MED ORDER — HEPARIN SOD (PORK) LOCK FLUSH 100 UNIT/ML IV SOLN
500.0000 [IU] | Freq: Once | INTRAVENOUS | Status: AC
  Administered 2020-06-22: 500 [IU] via INTRAVENOUS
  Filled 2020-06-22: qty 5

## 2020-06-22 MED ORDER — DOCUSATE SODIUM 100 MG PO CAPS: 200.0000 mg | ORAL_CAPSULE | Freq: Two times a day (BID) | ORAL | 0 refills | Status: DC | PRN

## 2020-06-22 NOTE — Progress Notes (Signed)
Cyclophosphamide dosage and calculations verified independently by me and by Lottie Dawson, RN.  [place], [facility]  06/22/2020  8:31 AM

## 2020-06-22 NOTE — Discharge Summary (Addendum)
Discharge Summary  Patient ID: [name] MRN: [id number] DOB/AGE: 03/08/1946 75 y.o.  Admit date: 06/18/2020 Discharge date: 06/22/2020  Discharge Diagnoses:  Active Problems:   Encounter for antineoplastic chemotherapy   Diffuse large B-cell lymphoma of lymph nodes of multiple sites [facility])   Drug-induced constipation   Discharged Condition: good  Discharge Labs:   CBC    Component Value Date/Time   WBC 4.7 06/22/2020 0515   RBC 3.23 (L) 06/22/2020 0515   HGB 9.8 (L) 06/22/2020 0515   HGB 10.7 (L) 06/15/2020 1006   HCT 28.6 (L) 06/22/2020 0515   PLT 131 (L) 06/22/2020 0515   PLT 133 (L) 06/15/2020 1006   MCV 88.5 06/22/2020 0515   MCH 30.3 06/22/2020 0515   MCHC 34.3 06/22/2020 0515   RDW 16.2 (H) 06/22/2020 0515   LYMPHSABS 0.3 (L) 06/22/2020 0515   MONOABS 0.2 06/22/2020 0515   EOSABS 0.0 06/22/2020 0515   BASOSABS 0.1 06/22/2020 0515   CMP Latest Ref Rng & Units 06/22/2020 06/21/2020 06/20/2020  Glucose 70 - 99 mg/dL 96 90 114(H)  BUN 8 - 23 mg/dL 28(H) 31(H) 25(H)  Creatinine 0.61 - 1.24 mg/dL 0.70 0.72 0.78  Sodium 135 - 145 mmol/L 139 139 139  Potassium 3.5 - 5.1 mmol/L 3.5 3.8 3.8  Chloride 98 - 111 mmol/L 106 108 106  CO2 22 - 32 mmol/L 23 23 23   Calcium 8.9 - 10.3 mg/dL 8.5(L) 8.4(L) 8.4(L)  Total Protein 6.5 - 8.1 g/dL 5.1(L) 5.1(L) 5.5(L)  Total Bilirubin 0.3 - 1.2 mg/dL 0.9 0.7 0.5  Alkaline Phos 38 - 126 U/L 63 67 73  AST 15 - 41 U/L 30 32 26  ALT 0 - 44 U/L 37 35 29    Consults: None  Procedures: 06/19/2020- fluoroscopic guided lumbar puncture and instillation of intrathecal methotrexate  Disposition: Discharge disposition: 01-Home or Self Care        Allergies as of 06/22/2020   No Known Allergies     Medication List    TAKE these medications   allopurinol 100 MG tablet Commonly known as: Zyloprim Take 1 tablet (100 mg total) by mouth daily.   aspirin EC 81 MG tablet Take 81 mg by mouth daily.   BOOST MAX PROTEIN PO Take 237  mLs by mouth daily. Chocolate   diphenhydrAMINE 25 mg capsule Commonly known as: BENADRYL Take 1 capsule (25 mg total) by mouth at bedtime as needed for sleep.   docusate sodium 100 MG capsule Commonly known as: COLACE Take 2 capsules (200 mg total) by mouth 2 (two) times daily as needed for mild constipation.   doxazosin 4 MG tablet Commonly known as: CARDURA Take 4 mg by mouth daily.   HYDROcodone-acetaminophen 5-325 MG tablet Commonly known as: NORCO/VICODIN Take 1 tablet by mouth every 6 (six) hours as needed for moderate pain.   lidocaine-prilocaine cream Commonly known as: EMLA Apply 1 application topically as needed. What changed:   when to take this  reasons to take this   magic mouthwash w/lidocaine Soln Take 15 mLs by mouth 4 (four) times daily as needed for mouth pain.   melatonin 3 MG Tabs tablet Take 2 tablets (6 mg total) by mouth at bedtime as needed (sleep). What changed: when to take this   metoprolol tartrate 25 MG tablet Commonly known as: LOPRESSOR Take 0.5 tablets (12.5 mg total) by mouth 2 (two) times daily.   MULTIVITAMIN PO Take 1 tablet by mouth daily.   OMEGA 3 PO Take 1  tablet by mouth daily.   ondansetron 8 MG tablet Commonly known as: ZOFRAN Take 1 tablet (8 mg total) by mouth every 8 (eight) hours as needed for nausea or vomiting.   pantoprazole 40 MG tablet Commonly known as: Protonix Take 1 tablet (40 mg total) by mouth daily.   polyethylene glycol 17 g packet Commonly known as: MIRALAX / GLYCOLAX Take 17 g by mouth daily as needed for mild constipation.   potassium chloride SA 20 MEQ tablet Commonly known as: KLOR-CON Take 1 tablet (20 mEq total) by mouth daily.   prochlorperazine 10 MG tablet Commonly known as: COMPAZINE Take 1 tablet (10 mg total) by mouth every 6 (six) hours as needed for nausea or vomiting.   senna-docusate 8.6-50 MG tablet Commonly known as: Senokot-S Take 2 tablets by mouth 2 (two) times daily as  needed for mild constipation.   simethicone 80 MG chewable tablet Commonly known as: MYLICON Chew 1 tablet (80 mg total) by mouth 4 (four) times daily as needed for flatulence.   sodium bicarbonate/sodium chloride Soln 1 application by Mouth Rinse route as needed for dry mouth or mouth pain. What changed: when to take this   vitamin C with rose hips 1000 MG tablet Take 1,000 mg by mouth daily.        HPI:  John [name] a 75year old Caucasian male, with past medical history of hypertension, BPH.  He was admitted to [facility] in November 2021 due to fatigue and constipation.  He was found to have severe hypercalcemia and CT scan showed a large mediastinal and liver mass with diffuse retroperitoneal lymphadenopathy.  He received IV fluids and pamidronate for treatment of hypercalcemia with resolution.  He also had hyperuricemia and was treated with rasburicase and then placed on allopurinol.  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  The patient was discharged home with a course of prednisone x5 days. Once biopsy resulted, it was recommended him to begin inpatient EPOCH-R for treatment of his lymphoma.    The patient is seen this morning for admission to the hospital.  He denies recent fevers, chills, chest pain, cough, shortness of breath.  He denies headaches and dizziness.  Denies mucositis.  Reports improvement in his appetite.  Denies abdominal pain, nausea, vomiting.  Bowels are moving without difficulty.  Lower extremity edema has resolved.  Denies peripheral neuropathy he patient is seen today for admission for cycle #3 of EPOCH-R.   Hospital Course:  John [name] was admitted on 06/18/2020 and chemotherapy was started as planned on day 1.  The patient overall tolerated his chemotherapy well this admission.  He had no significant side effects such as mucositis, nausea, vomiting.  He  had no difficulty with constipation this admission.  The patient received intrathecal methotrexate on 06/18/2020 and tolerated the procedure well.  He was seen by the dietitian during this admission and supplements were ordered.  The patient was seen on the morning of 06/22/2020 and was feeling well overall.  His exam remained unchanged.  The patient was stable for discharge on the morning of 06/22/2020.  He will return on 06/25/2020 for Rituxan and Neulasta.  He is also scheduled for labs, follow-up visit, and possible blood transfusion on 06/29/2020   Discharge Instructions    Activity as tolerated - No restrictions   Complete by: As directed    Diet general   Complete by: As directed  Signed: Mikey Bussing 06/22/2020, 11:22 AM   Addendum  I have seen the patient, examined him. I agree with the assessment and and plan and have edited the notes.   Pt has completed chemo uneventfully, feels well, lab reviewed. Plan to discharge home today. He has f/u and rituximab infusion, GCSF injection appointment in our office next Monday.   Truitt Merle  06/22/2020

## 2020-06-25 ENCOUNTER — Other Ambulatory Visit: Payer: Medicare HMO

## 2020-06-25 ENCOUNTER — Inpatient Hospital Stay: Payer: Medicare HMO

## 2020-06-25 ENCOUNTER — Other Ambulatory Visit: Payer: Self-pay

## 2020-06-25 VITALS — BP 127/58 | HR 92 | Temp 98.5°F | Resp 18 | Wt 201.0 lb

## 2020-06-25 DIAGNOSIS — C8330 Diffuse large B-cell lymphoma, unspecified site: Secondary | ICD-10-CM

## 2020-06-25 DIAGNOSIS — C8339 Diffuse large B-cell lymphoma, extranodal and solid organ sites: Secondary | ICD-10-CM | POA: Diagnosis present

## 2020-06-25 DIAGNOSIS — D649 Anemia, unspecified: Secondary | ICD-10-CM | POA: Diagnosis not present

## 2020-06-25 DIAGNOSIS — Z5189 Encounter for other specified aftercare: Secondary | ICD-10-CM | POA: Diagnosis not present

## 2020-06-25 DIAGNOSIS — R634 Abnormal weight loss: Secondary | ICD-10-CM | POA: Diagnosis not present

## 2020-06-25 DIAGNOSIS — N179 Acute kidney failure, unspecified: Secondary | ICD-10-CM | POA: Diagnosis not present

## 2020-06-25 DIAGNOSIS — Z5112 Encounter for antineoplastic immunotherapy: Secondary | ICD-10-CM | POA: Diagnosis not present

## 2020-06-25 DIAGNOSIS — R5383 Other fatigue: Secondary | ICD-10-CM | POA: Diagnosis not present

## 2020-06-25 DIAGNOSIS — D61810 Antineoplastic chemotherapy induced pancytopenia: Secondary | ICD-10-CM | POA: Diagnosis not present

## 2020-06-25 MED ORDER — DIPHENHYDRAMINE HCL 25 MG PO CAPS
50.0000 mg | ORAL_CAPSULE | Freq: Once | ORAL | Status: AC
Start: 2020-06-25 — End: 2020-06-25
  Administered 2020-06-25: 50 mg via ORAL

## 2020-06-25 MED ORDER — DIPHENHYDRAMINE HCL 25 MG PO CAPS
ORAL_CAPSULE | ORAL | Status: AC
  Filled 2020-06-25: qty 2

## 2020-06-25 MED ORDER — SODIUM CHLORIDE 0.9% FLUSH
10.0000 mL | INTRAVENOUS | Status: DC | PRN
  Administered 2020-06-25: 10 mL
  Filled 2020-06-25: qty 10

## 2020-06-25 MED ORDER — SODIUM CHLORIDE 0.9 % IV SOLN
375.0000 mg/m2 | Freq: Once | INTRAVENOUS | Status: AC
  Administered 2020-06-25: 800 mg via INTRAVENOUS
  Filled 2020-06-25: qty 50

## 2020-06-25 MED ORDER — HEPARIN SOD (PORK) LOCK FLUSH 100 UNIT/ML IV SOLN
500.0000 [IU] | Freq: Once | INTRAVENOUS | Status: AC | PRN
Start: 2020-06-25 — End: 2020-06-25
  Administered 2020-06-25: 500 [IU]
  Filled 2020-06-25: qty 5

## 2020-06-25 MED ORDER — SODIUM CHLORIDE 0.9 % IV SOLN
INTRAVENOUS | Status: DC
  Filled 2020-06-25: qty 250

## 2020-06-25 MED ORDER — ACETAMINOPHEN 325 MG PO TABS
650.0000 mg | ORAL_TABLET | Freq: Once | ORAL | Status: AC
  Administered 2020-06-25: 650 mg via ORAL

## 2020-06-25 MED ORDER — ACETAMINOPHEN 325 MG PO TABS
ORAL_TABLET | ORAL | Status: AC
  Filled 2020-06-25: qty 2

## 2020-06-25 NOTE — Progress Notes (Signed)
Per Dr. Burr Medico ok to treat with today's heart rate as documented on flow sheet.

## 2020-06-25 NOTE — Patient Instructions (Signed)
Rituximab injection What is this medicine? RITUXIMAB (ri TUX i mab) is a monoclonal antibody. It is used to treat certain types of cancer like non-Hodgkin lymphoma and chronic lymphocytic leukemia. It is also used to treat rheumatoid arthritis, granulomatosis with polyangiitis (or Wegener's granulomatosis), microscopic polyangiitis, and pemphigus vulgaris. This medicine may be used for other purposes; ask your health care provider or pharmacist if you have questions. COMMON BRAND NAME(S): Rituxan, RUXIENCE What should I tell my health care provider before I take this medicine? They need to know if you have any of these conditions:  heart disease  infection (especially a virus infection such as hepatitis B, chickenpox, cold sores, or herpes)  immune system problems  irregular heartbeat  kidney disease  low blood counts, like low white cell, platelet, or red cell counts  lung or breathing disease, like asthma  recently received or scheduled to receive a vaccine  an unusual or allergic reaction to rituximab, other medicines, foods, dyes, or preservatives  pregnant or trying to get pregnant  breast-feeding How should I use this medicine? This medicine is for infusion into a vein. It is administered in a hospital or clinic by a specially trained health care professional. A special MedGuide will be given to you by the pharmacist with each prescription and refill. Be sure to read this information carefully each time. Talk to your pediatrician regarding the use of this medicine in children. This medicine is not approved for use in children. Overdosage: If you think you have taken too much of this medicine contact a poison control center or emergency room at once. NOTE: This medicine is only for you. Do not share this medicine with others. What if I miss a dose? It is important not to miss a dose. Call your doctor or health care professional if you are unable to keep an appointment. What  may interact with this medicine?  cisplatin  live virus vaccines This list may not describe all possible interactions. Give your health care provider a list of all the medicines, herbs, non-prescription drugs, or dietary supplements you use. Also tell them if you smoke, drink alcohol, or use illegal drugs. Some items may interact with your medicine. What should I watch for while using this medicine? Your condition will be monitored carefully while you are receiving this medicine. You may need blood work done while you are taking this medicine. This medicine can cause serious allergic reactions. To reduce your risk you may need to take medicine before treatment with this medicine. Take your medicine as directed. In some patients, this medicine may cause a serious brain infection that may cause death. If you have any problems seeing, thinking, speaking, walking, or standing, tell your healthcare professional right away. If you cannot reach your healthcare professional, urgently seek other source of medical care. Call your doctor or health care professional for advice if you get a fever, chills or sore throat, or other symptoms of a cold or flu. Do not treat yourself. This drug decreases your body's ability to fight infections. Try to avoid being around people who are sick. Do not become pregnant while taking this medicine or for at least 12 months after stopping it. Women should inform their doctor if they wish to become pregnant or think they might be pregnant. There is a potential for serious side effects to an unborn child. Talk to your health care professional or pharmacist for more information. Do not breast-feed an infant while taking this medicine or for at   least 6 months after stopping it. What side effects may I notice from receiving this medicine? Side effects that you should report to your doctor or health care professional as soon as possible:  allergic reactions like skin rash, itching or  hives; swelling of the face, lips, or tongue  breathing problems  chest pain  changes in vision  diarrhea  headache with fever, neck stiffness, sensitivity to light, nausea, or confusion  fast, irregular heartbeat  loss of memory  low blood counts - this medicine may decrease the number of white blood cells, red blood cells and platelets. You may be at increased risk for infections and bleeding.  mouth sores  problems with balance, talking, or walking  redness, blistering, peeling or loosening of the skin, including inside the mouth  signs of infection - fever or chills, cough, sore throat, pain or difficulty passing urine  signs and symptoms of kidney injury like trouble passing urine or change in the amount of urine  signs and symptoms of liver injury like dark yellow or brown urine; general ill feeling or flu-like symptoms; light-colored stools; loss of appetite; nausea; right upper belly pain; unusually weak or tired; yellowing of the eyes or skin  signs and symptoms of low blood pressure like dizziness; feeling faint or lightheaded, falls; unusually weak or tired  stomach pain  swelling of the ankles, feet, hands  unusual bleeding or bruising  vomiting Side effects that usually do not require medical attention (report to your doctor or health care professional if they continue or are bothersome):  headache  joint pain  muscle cramps or muscle pain  nausea  tiredness This list may not describe all possible side effects. Call your doctor for medical advice about side effects. You may report side effects to FDA at 1-800-FDA-1088. Where should I keep my medicine? This drug is given in a hospital or clinic and will not be stored at home. NOTE: This sheet is a summary. It may not cover all possible information. If you have questions about this medicine, talk to your doctor, pharmacist, or health care provider.  2020 Elsevier/Gold Standard (2018-07-07  22:01:36)  

## 2020-06-27 ENCOUNTER — Telehealth: Payer: Self-pay

## 2020-06-27 ENCOUNTER — Other Ambulatory Visit: Payer: Self-pay

## 2020-06-27 DIAGNOSIS — C8330 Diffuse large B-cell lymphoma, unspecified site: Secondary | ICD-10-CM

## 2020-06-27 NOTE — Progress Notes (Signed)
[place]   [phone] [phone]   Clinic Follow up Note   Patient Care Team: Christain Sacramento, MD as PCP - General (Family Medicine)  Date of Service:  06/29/2020  CHIEF COMPLAINT: F/u ofDiffuse large B cell lymphoma  SUMMARY OF ONCOLOGIC HISTORY: Oncology History Overview Note  Cancer Staging Diffuse large B cell lymphoma ([place]) Staging form: Hodgkin and Non-Hodgkin Lymphoma, AJCC 8th Edition - Clinical stage from 04/23/2020: Stage IV (Diffuse large B-cell lymphoma) - Signed by Truitt Merle, MD on 04/29/2020    Diffuse large B cell lymphoma ([place])  04/20/2020 Imaging   CT CAP  IMPRESSION: 1. Large anterior mediastinal/prevascular space mass/adenopathy. The mass extends superiorly and abuts the upper trachea. 2. Large hypodense lesion in the right lobe of the liver as well as ill-defined splenic hypodense lesions. Further characterization with MRI without and with contrast recommended. The liver lesion is amenable to percutaneous tissue sampling. 3. Extensive mesenteric and retroperitoneal adenopathy. 4. Sigmoid diverticulosis. No bowel obstruction. Normal appendix. 5. Aortic Atherosclerosis (ICD10-I70.0).   04/23/2020 Cancer Staging   Staging form: Hodgkin and Non-Hodgkin Lymphoma, AJCC 8th Edition - Clinical stage from 04/23/2020: Stage IV (Diffuse large B-cell lymphoma) - Signed by Truitt Merle, MD on 04/29/2020   04/23/2020 Initial Biopsy   FINAL MICROSCOPIC DIAGNOSIS:   A. LIVER, RIGHT MASS, NEEDLE CORE BIOPSY:  - Large B-cell lymphoma  - See comment     COMMENT:   The sections show needle core biopsy fragments displaying effacement of  the architecture by a dense infiltrate of primarily large atypical  lymphoid appearing cells displaying vesicular chromatin and small  nucleoli.  This is associated with apoptosis and brisk mitosis.  The  appearance is diffuse with lack of atypical follicles.  A large battery  of  immunohistochemical stains was performed and shows that the atypical  lymphoid cells are positive for LCA, CD20, PAX 5, BCL-2, BCL6, CD5,  MUM-1 (partial).  The atypical lymphoid cells are negative for CD10,  CD30, CD34, CD138, cyclin D1, TdT, and EBV in situ hybridization,  synaptophysin, cytokeratin AE1/AE3, cytokeratin 20, cytokeratin 7,  cytokeratin 5/6, TTF-1, CD56, CDX2, and p40.  There is an admixed  relatively minor population of T-cells as primarily seen with CD3.  The  overall features are consistent with large B-cell lymphoma, ABC subtype.  The lymphomatous process displays expression of CD5.  This phenotype is  seen in 5-10% of mostly de novo large B-cell lymphoma cases or rarely as  a transformation of a low-grade B-cell lymphoma such as small  lymphocytic lymphoma.  Clinical correlation is recommended.    04/25/2020 Imaging   CT AP  IMPRESSION: 1. Development of high-density ascites in the abdomen and pelvis. Findings are most compatible with a small to moderate amount of hemoperitoneum and related to the recent liver biopsy. 2. Extensive lymphadenopathy throughout the abdomen and pelvis with a large mass or nodal mass near the pancreatic head. There are additional lesions involving the liver and spleen. Findings are suggestive for metastatic disease. 3. Development of small bilateral pleural effusions.   These results were called by telephone at the time of interpretation on 04/25/2020 at 1:43 pm to provider ERIC [place] , who verbally acknowledged these results.   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  He presents to the clinic with his wife. He notes he was not given his GCSF injection on 1/17. He notes after latest cycle he has manageable fatigue and he was able to gain weight. He denies mouth sores.    REVIEW OF SYSTEMS:   Constitutional: Denies fevers, chills or abnormal weight loss Eyes: Denies blurriness of vision Ears, nose, mouth, throat, and face: Denies mucositis or sore throat Respiratory: Denies cough, dyspnea or wheezes Cardiovascular: Denies palpitation, chest discomfort or lower extremity swelling Gastrointestinal:  Denies nausea, heartburn or change in bowel habits Skin: Denies abnormal skin rashes Lymphatics: Denies new lymphadenopathy or easy bruising Neurological:Denies numbness, tingling or new weaknesses Behavioral/Psych: Mood is stable, no new changes  All other systems were reviewed with the patient and are negative.  MEDICAL HISTORY:  Past Medical History:  Diagnosis Date  . Cancer ([place])   . Goals of care, counseling/discussion 05/06/2020  . Hypertension   . Knee pain, right     SURGICAL HISTORY: Past Surgical History:  Procedure Laterality Date  . FOOT SURGERY    . IR IMAGING GUIDED PORT INSERTION  05/28/2020  . TONSILLECTOMY      I have reviewed the social history and family history with the patient and they are unchanged from previous note.  ALLERGIES:  has No Known Allergies.  MEDICATIONS:  Current Outpatient Medications  Medication Sig Dispense Refill  . allopurinol (ZYLOPRIM) 100 MG tablet Take 1 tablet (100 mg total) by mouth daily. 30 tablet 0  . Ascorbic Acid (VITAMIN C WITH ROSE HIPS) 1000 MG tablet Take 1,000 mg by mouth daily.    Marland Kitchen aspirin EC 81 MG tablet Take 81 mg by mouth daily.    . diphenhydrAMINE (BENADRYL) 25 mg capsule Take 1 capsule (25 mg total) by mouth at bedtime as needed for sleep. (Patient not taking: No sig reported) 30 capsule 0  . docusate sodium (COLACE) 100 MG capsule Take 2 capsules (200 mg total) by mouth 2 (two)  times daily as needed for mild constipation. 10 capsule 0  . doxazosin (CARDURA) 4 MG tablet Take 4 mg by mouth daily.    Marland Kitchen HYDROcodone-acetaminophen (NORCO/VICODIN) 5-325 MG tablet Take 1 tablet by mouth every 6 (six) hours as needed for moderate pain. (Patient not taking: No sig reported) 30 tablet 0  . lidocaine-prilocaine (EMLA) cream Apply 1 application topically as needed. (Patient taking differently: Apply 1 application topically daily as needed (pain).) 30 g 0  . magic mouthwash w/lidocaine SOLN Take 15 mLs by mouth 4 (four) times daily as needed for mouth pain. (Patient not taking: No sig reported) 400 mL 0  . melatonin 3 MG TABS tablet Take 2 tablets (6 mg total) by mouth at bedtime as needed (sleep). (Patient taking differently: Take 6 mg by mouth at bedtime.) 30 tablet 0  . metoprolol tartrate (LOPRESSOR) 25 MG tablet Take 0.5 tablets (12.5 mg total) by mouth 2 (two) times daily. 30 tablet 0  . Multiple Vitamins-Minerals (MULTIVITAMIN PO) Take 1 tablet by mouth daily.    . Nutritional Supplements (BOOST MAX PROTEIN PO) Take 237 mLs by mouth daily. Chocolate    . Omega-3 Fatty Acids (OMEGA 3 PO) Take 1 tablet by mouth daily.    . ondansetron (ZOFRAN) 8 MG tablet Take 1 tablet (8 mg total) by mouth every 8 (eight) hours as needed for nausea or vomiting.  20 tablet 0  . pantoprazole (PROTONIX) 40 MG tablet Take 1 tablet (40 mg total) by mouth daily. 30 tablet 3  . polyethylene glycol (MIRALAX / GLYCOLAX) 17 g packet Take 17 g by mouth daily as needed for mild constipation.    . prochlorperazine (COMPAZINE) 10 MG tablet Take 1 tablet (10 mg total) by mouth every 6 (six) hours as needed for nausea or vomiting. 30 tablet 0  . senna-docusate (SENOKOT-S) 8.6-50 MG tablet Take 2 tablets by mouth 2 (two) times daily as needed for mild constipation.    . simethicone (MYLICON) 80 MG chewable tablet Chew 1 tablet (80 mg total) by mouth 4 (four) times daily as needed for flatulence. (Patient not taking:  No sig reported) 30 tablet 0  . Sodium Chloride-Sodium Bicarb (SODIUM BICARBONATE/SODIUM CHLORIDE) SOLN 1 application by Mouth Rinse route as needed for dry mouth or mouth pain. (Patient taking differently: 1 application by Mouth Rinse route 4 (four) times daily as needed for dry mouth or mouth pain.)     No current facility-administered medications for this visit.    PHYSICAL EXAMINATION: ECOG PERFORMANCE STATUS: 2 - Symptomatic, <50% confined to bed  Vitals:   06/29/20 1135  BP: 122/64  Pulse: (!) 104  Resp: 18  Temp: 97.9 F (36.6 C)  SpO2: 100%   Filed Weights   06/29/20 1135  Weight: 203 lb 1.6 oz (92.1 kg)    GENERAL:alert, no distress and comfortable SKIN: skin color, texture, turgor are normal, no rashes or significant lesions EYES: normal, Conjunctiva are pink and non-injected, sclera clear Musculoskeletal:no cyanosis of digits and no clubbing  NEURO: alert & oriented x 3 with fluent speech, no focal motor/sensory deficits  LABORATORY DATA:  I have reviewed the data as listed CBC Latest Ref Rng & Units 06/29/2020 06/22/2020 06/21/2020  WBC 4.0 - 10.5 K/uL 1.9(L) 4.7 6.0  Hemoglobin 13.0 - 17.0 g/dL 8.8(L) 9.8(L) 9.7(L)  Hematocrit 39.0 - 52.0 % 25.6(L) 28.6(L) 29.1(L)  Platelets 150 - 400 K/uL 78(L) 131(L) 134(L)     CMP Latest Ref Rng & Units 06/29/2020 06/22/2020 06/21/2020  Glucose 70 - 99 mg/dL 156(H) 96 90  BUN 8 - 23 mg/dL 16 28(H) 31(H)  Creatinine 0.61 - 1.24 mg/dL 0.83 0.70 0.72  Sodium 135 - 145 mmol/L 138 139 139  Potassium 3.5 - 5.1 mmol/L 3.8 3.5 3.8  Chloride 98 - 111 mmol/L 105 106 108  CO2 22 - 32 mmol/L 24 23 23   Calcium 8.9 - 10.3 mg/dL 8.9 8.5(L) 8.4(L)  Total Protein 6.5 - 8.1 g/dL 5.6(L) 5.1(L) 5.1(L)  Total Bilirubin 0.3 - 1.2 mg/dL 0.5 0.9 0.7  Alkaline Phos 38 - 126 U/L 67 63 67  AST 15 - 41 U/L 16 30 32  ALT 0 - 44 U/L 21 37 35      RADIOGRAPHIC STUDIES: I have personally reviewed the radiological images as listed and agreed with  the findings in the report. No results found.   ASSESSMENT & PLAN:  John [name] is a 75 y.o. male with   1.Diffuse large B cell lymphoma, ABC Subtype, stage IV  -After worsening fatigue pt presented to ED. 04/20/20 CT CAP showedlargemediastinal mass and diffuseliver masses, and diffuse retroperitoneal lymphadenopathy. -Liver biopsy from 04/23/20 shows diffuse large B-Cell lymphoma,ABC subtype, which carries a worse prognosis. Given his liver metastasis and LN involvement, this is stage IV. His lymphoma FISH panel isnegative fordouble hit lymphoma -I started him on Inpatient R-EPOCHq3weeksfor 6 cycles on 05/01/20. Given hishigh  risk for CNS involvement, I started intrathecal treatmentwith methotrexateprophylacticallyfrom C2. -S/p C3 he tolerated well with no significant side effected. Labs reviewed, WBC 1.9, Hg 8.8, plt 78K, ANC 1.5, BG 156, protein 5.6, Albumin 3.1. He was not given his pegfilgrastim on 1/17, will proceed with filgrastim injection today and continue dialy for 3 days.  -Will proceed with PET scan on 07/04/20 and f/u next week   2. Pancytopenia, secondary to chemo -He was given GCSF after Cycle 1.He still had pancytopenia with neutropenic fever. Hewas treated as inpatient and required blood and platelet transfusion. -He required blood transfusion on 06/14/20. Labs reviewed today with WBC 11.3, Hg 10.7, plt 133K (06/15/20)  3. Fatigue, Low appetite,and significant weight loss -secondary to #1and chemotherapy -We will request inpatient nutrition consult and PT -His energy was better with C2 and has recovered well. Weight and appetite stable.    PLAN: -Give filgrastim injection today, tomorrow and the day after tomorrow   -PET scan on 07/04/20, f/u on 1/27  -no blood transfusion this time  -Plan to admit on 1/31 for cycle 4 chemo,will obtain PA   No problem-specific Assessment & Plan notes found for this encounter.   No orders of the defined types  were placed in this encounter.  All questions were answered. The patient knows to call the clinic with any problems, questions or concerns. No barriers to learning was detected. The total time spent in the appointment was 30 minutes.     Truitt Merle, MD 06/29/2020   I, Joslyn Devon, am acting as scribe for Truitt Merle, MD.   I have reviewed the above documentation for accuracy and completeness, and I agree with the above.

## 2020-06-27 NOTE — Telephone Encounter (Signed)
I spoke with Mr Aragones and let him know his appt with Dr Burr Medico on 1/31 has been moved to 1/27 with lab and port flush appts prior.  He verbalized understanding.

## 2020-06-29 ENCOUNTER — Inpatient Hospital Stay: Payer: Medicare HMO | Admitting: Hematology

## 2020-06-29 ENCOUNTER — Encounter: Payer: Self-pay | Admitting: Hematology

## 2020-06-29 ENCOUNTER — Inpatient Hospital Stay: Payer: Medicare HMO

## 2020-06-29 ENCOUNTER — Other Ambulatory Visit: Payer: Self-pay

## 2020-06-29 ENCOUNTER — Telehealth: Payer: Self-pay | Admitting: Hematology

## 2020-06-29 VITALS — BP 122/64 | HR 104 | Temp 97.9°F | Resp 18 | Ht 71.0 in | Wt 203.1 lb

## 2020-06-29 DIAGNOSIS — Z95828 Presence of other vascular implants and grafts: Secondary | ICD-10-CM

## 2020-06-29 DIAGNOSIS — C8330 Diffuse large B-cell lymphoma, unspecified site: Secondary | ICD-10-CM

## 2020-06-29 DIAGNOSIS — Z5112 Encounter for antineoplastic immunotherapy: Secondary | ICD-10-CM | POA: Diagnosis not present

## 2020-06-29 DIAGNOSIS — C8338 Diffuse large B-cell lymphoma, lymph nodes of multiple sites: Secondary | ICD-10-CM

## 2020-06-29 DIAGNOSIS — E876 Hypokalemia: Secondary | ICD-10-CM

## 2020-06-29 DIAGNOSIS — C8339 Diffuse large B-cell lymphoma, extranodal and solid organ sites: Secondary | ICD-10-CM

## 2020-06-29 LAB — CBC WITH DIFFERENTIAL (CANCER CENTER ONLY)
Abs Immature Granulocytes: 0.02 10*3/uL (ref 0.00–0.07)
Basophils Absolute: 0 10*3/uL (ref 0.0–0.1)
Basophils Relative: 2 %
Eosinophils Absolute: 0.1 10*3/uL (ref 0.0–0.5)
Eosinophils Relative: 6 %
HCT: 25.6 % — ABNORMAL LOW (ref 39.0–52.0)
Hemoglobin: 8.8 g/dL — ABNORMAL LOW (ref 13.0–17.0)
Immature Granulocytes: 1 %
Lymphocytes Relative: 5 %
Lymphs Abs: 0.1 10*3/uL — ABNORMAL LOW (ref 0.7–4.0)
MCH: 30.4 pg (ref 26.0–34.0)
MCHC: 34.4 g/dL (ref 30.0–36.0)
MCV: 88.6 fL (ref 80.0–100.0)
Monocytes Absolute: 0.1 10*3/uL (ref 0.1–1.0)
Monocytes Relative: 7 %
Neutro Abs: 1.5 10*3/uL — ABNORMAL LOW (ref 1.7–7.7)
Neutrophils Relative %: 79 %
Platelet Count: 78 10*3/uL — ABNORMAL LOW (ref 150–400)
RBC: 2.89 MIL/uL — ABNORMAL LOW (ref 4.22–5.81)
RDW: 15.6 % — ABNORMAL HIGH (ref 11.5–15.5)
WBC Count: 1.9 10*3/uL — ABNORMAL LOW (ref 4.0–10.5)
nRBC: 0 % (ref 0.0–0.2)

## 2020-06-29 LAB — CMP (CANCER CENTER ONLY)
ALT: 21 U/L (ref 0–44)
AST: 16 U/L (ref 15–41)
Albumin: 3.1 g/dL — ABNORMAL LOW (ref 3.5–5.0)
Alkaline Phosphatase: 67 U/L (ref 38–126)
Anion gap: 9 (ref 5–15)
BUN: 16 mg/dL (ref 8–23)
CO2: 24 mmol/L (ref 22–32)
Calcium: 8.9 mg/dL (ref 8.9–10.3)
Chloride: 105 mmol/L (ref 98–111)
Creatinine: 0.83 mg/dL (ref 0.61–1.24)
GFR, Estimated: >60 mL/min (ref >60)
Glucose, Bld: 156 mg/dL — ABNORMAL HIGH (ref 70–99)
Potassium: 3.8 mmol/L (ref 3.5–5.1)
Sodium: 138 mmol/L (ref 135–145)
Total Bilirubin: 0.5 mg/dL (ref 0.3–1.2)
Total Protein: 5.6 g/dL — ABNORMAL LOW (ref 6.5–8.1)

## 2020-06-29 LAB — LACTATE DEHYDROGENASE: LDH: 228 U/L — ABNORMAL HIGH (ref 98–192)

## 2020-06-29 LAB — SAMPLE TO BLOOD BANK

## 2020-06-29 LAB — URIC ACID: Uric Acid, Serum: 5 mg/dL (ref 3.7–8.6)

## 2020-06-29 MED ORDER — FILGRASTIM-AAFI 480 MCG/0.8ML IJ SOSY
480.0000 ug | PREFILLED_SYRINGE | Freq: Once | INTRAMUSCULAR | Status: AC
  Administered 2020-06-29: 480 ug via SUBCUTANEOUS
  Filled 2020-06-29: qty 0.8

## 2020-06-29 MED ORDER — HEPARIN SOD (PORK) LOCK FLUSH 100 UNIT/ML IV SOLN
500.0000 [IU] | Freq: Once | INTRAVENOUS | Status: DC
  Administered 2020-06-29: 500 [IU] via INTRAVENOUS
  Filled 2020-06-29: qty 5

## 2020-06-29 MED ORDER — SODIUM CHLORIDE 0.9% FLUSH
10.0000 mL | INTRAVENOUS | Status: DC | PRN
  Administered 2020-06-29: 10 mL via INTRAVENOUS
  Filled 2020-06-29: qty 10

## 2020-06-29 MED ORDER — ALLOPURINOL 100 MG PO TABS: 100.0000 mg | ORAL_TABLET | Freq: Every day | ORAL | 0 refills | Status: DC

## 2020-06-29 NOTE — Addendum Note (Signed)
Addended by: Tora Kindred on: 06/29/2020 12:35 PM   Modules accepted: Orders

## 2020-06-29 NOTE — Progress Notes (Signed)
Medication refill/potassium discontinued per provider

## 2020-06-29 NOTE — Telephone Encounter (Signed)
Scheduled injection appointments per 1/21 schedule message. Patient is aware.

## 2020-06-30 ENCOUNTER — Other Ambulatory Visit: Payer: Self-pay

## 2020-06-30 ENCOUNTER — Other Ambulatory Visit: Payer: Self-pay | Admitting: Hematology

## 2020-06-30 ENCOUNTER — Inpatient Hospital Stay: Payer: Medicare HMO

## 2020-06-30 VITALS — BP 114/52 | HR 102 | Temp 97.6°F | Resp 18

## 2020-06-30 DIAGNOSIS — Z5112 Encounter for antineoplastic immunotherapy: Secondary | ICD-10-CM | POA: Diagnosis not present

## 2020-06-30 DIAGNOSIS — C8338 Diffuse large B-cell lymphoma, lymph nodes of multiple sites: Secondary | ICD-10-CM

## 2020-06-30 DIAGNOSIS — E876 Hypokalemia: Secondary | ICD-10-CM

## 2020-06-30 MED ORDER — FILGRASTIM-AAFI 480 MCG/0.8ML IJ SOSY
480.0000 ug | PREFILLED_SYRINGE | Freq: Once | INTRAMUSCULAR | Status: AC
  Administered 2020-06-30: 480 ug via SUBCUTANEOUS

## 2020-06-30 NOTE — Patient Instructions (Signed)

## 2020-07-01 ENCOUNTER — Inpatient Hospital Stay: Payer: Medicare HMO

## 2020-07-01 VITALS — BP 115/59 | HR 102 | Temp 98.2°F | Resp 20

## 2020-07-01 DIAGNOSIS — Z5112 Encounter for antineoplastic immunotherapy: Secondary | ICD-10-CM | POA: Diagnosis not present

## 2020-07-01 DIAGNOSIS — C8338 Diffuse large B-cell lymphoma, lymph nodes of multiple sites: Secondary | ICD-10-CM

## 2020-07-01 MED ORDER — FILGRASTIM-AAFI 480 MCG/0.8ML IJ SOSY
480.0000 ug | PREFILLED_SYRINGE | Freq: Once | INTRAMUSCULAR | Status: AC
  Administered 2020-07-01: 480 ug via SUBCUTANEOUS
  Filled 2020-07-01: qty 0.8

## 2020-07-01 NOTE — Patient Instructions (Signed)

## 2020-07-02 ENCOUNTER — Telehealth: Payer: Self-pay | Admitting: Hematology

## 2020-07-02 NOTE — Telephone Encounter (Signed)
No 12/1 los. No changes made to pt's schedule.  

## 2020-07-04 ENCOUNTER — Other Ambulatory Visit: Payer: Self-pay

## 2020-07-04 ENCOUNTER — Ambulatory Visit (HOSPITAL_COMMUNITY)
Admission: RE | Admit: 2020-07-04 | Discharge: 2020-07-04 | Disposition: A | Discharge status: Home / Self Care | Payer: Medicare HMO | Source: Ambulatory Visit | Attending: Hematology | Admitting: Hematology

## 2020-07-04 ENCOUNTER — Ambulatory Visit (HOSPITAL_COMMUNITY): Payer: Medicare HMO

## 2020-07-04 DIAGNOSIS — M4316 Spondylolisthesis, lumbar region: Secondary | ICD-10-CM | POA: Diagnosis not present

## 2020-07-04 DIAGNOSIS — R222 Localized swelling, mass and lump, trunk: Secondary | ICD-10-CM | POA: Diagnosis not present

## 2020-07-04 DIAGNOSIS — C8338 Diffuse large B-cell lymphoma, lymph nodes of multiple sites: Secondary | ICD-10-CM | POA: Diagnosis present

## 2020-07-04 LAB — GLUCOSE, CAPILLARY: Glucose-Capillary: 116 mg/dL — ABNORMAL HIGH (ref 70–99)

## 2020-07-04 MED ORDER — FLUDEOXYGLUCOSE F - 18 (FDG) INJECTION
10.1100 | Freq: Once | INTRAVENOUS | Status: AC | PRN
  Administered 2020-07-04: 10.11 via INTRAVENOUS

## 2020-07-04 NOTE — Progress Notes (Signed)
[facility]   [phone] [phone]   Clinic Follow up Note   Patient Care Team: Christain Sacramento, MD as PCP - General (Family Medicine)  Date of Service:  07/05/2020  CHIEF COMPLAINT: F/u ofDiffuse large B cell lymphoma  SUMMARY OF ONCOLOGIC HISTORY: Oncology History Overview Note  Cancer Staging Diffuse large B cell lymphoma ([place]) Staging form: Hodgkin and Non-Hodgkin Lymphoma, AJCC 8th Edition - Clinical stage from 04/23/2020: Stage IV (Diffuse large B-cell lymphoma) - Signed by Truitt Merle, MD on 04/29/2020    Diffuse large B cell lymphoma ([place])  04/20/2020 Imaging   CT CAP  IMPRESSION: 1. Large anterior mediastinal/prevascular space mass/adenopathy. The mass extends superiorly and abuts the upper trachea. 2. Large hypodense lesion in the right lobe of the liver as well as ill-defined splenic hypodense lesions. Further characterization with MRI without and with contrast recommended. The liver lesion is amenable to percutaneous tissue sampling. 3. Extensive mesenteric and retroperitoneal adenopathy. 4. Sigmoid diverticulosis. No bowel obstruction. Normal appendix. 5. Aortic Atherosclerosis (ICD10-I70.0).   04/23/2020 Cancer Staging   Staging form: Hodgkin and Non-Hodgkin Lymphoma, AJCC 8th Edition - Clinical stage from 04/23/2020: Stage IV (Diffuse large B-cell lymphoma) - Signed by Truitt Merle, MD on 04/29/2020   04/23/2020 Initial Biopsy   FINAL MICROSCOPIC DIAGNOSIS:   A. LIVER, RIGHT MASS, NEEDLE CORE BIOPSY:  - Large B-cell lymphoma  - See comment     COMMENT:   The sections show needle core biopsy fragments displaying effacement of  the architecture by a dense infiltrate of primarily large atypical  lymphoid appearing cells displaying vesicular chromatin and small  nucleoli.  This is associated with apoptosis and brisk mitosis.  The  appearance is diffuse with lack of atypical follicles.  A large battery  of  immunohistochemical stains was performed and shows that the atypical  lymphoid cells are positive for LCA, CD20, PAX 5, BCL-2, BCL6, CD5,  MUM-1 (partial).  The atypical lymphoid cells are negative for CD10,  CD30, CD34, CD138, cyclin D1, TdT, and EBV in situ hybridization,  synaptophysin, cytokeratin AE1/AE3, cytokeratin 20, cytokeratin 7,  cytokeratin 5/6, TTF-1, CD56, CDX2, and p40.  There is an admixed  relatively minor population of T-cells as primarily seen with CD3.  The  overall features are consistent with large B-cell lymphoma, ABC subtype.  The lymphomatous process displays expression of CD5.  This phenotype is  seen in 5-10% of mostly de novo large B-cell lymphoma cases or rarely as  a transformation of a low-grade B-cell lymphoma such as small  lymphocytic lymphoma.  Clinical correlation is recommended.    04/25/2020 Imaging   CT AP  IMPRESSION: 1. Development of high-density ascites in the abdomen and pelvis. Findings are most compatible with a small to moderate amount of hemoperitoneum and related to the recent liver biopsy. 2. Extensive lymphadenopathy throughout the abdomen and pelvis with a large mass or nodal mass near the pancreatic head. There are additional lesions involving the liver and spleen. Findings are suggestive for metastatic disease. 3. Development of small bilateral pleural effusions.   These results were called by telephone at the time of interpretation on 04/25/2020 at 1:43 pm to provider ERIC [place] , who verbally acknowledged these results.   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 Marked reduction  in volume of anterior mediastinal mass now with low metabolic activity ( [place] 2). 2. Reduction in volume of retroperitoneal nodal mass  adjacent the pancreas with low metabolic activity ( [place] 3). 3. No increased metabolic activity associated with the spleen 4. Interval contraction of hepatic hematoma post biopsy. No hypermetabolic elements within the hepatic lesion. 5. Grade 2 anterolisthesis L4 on L5.        CURRENT THERAPY:  Inpatient R-EPOCHq3weeksfor 6 cycles starting 05/01/20 with intrathecal prophylactic treatmentstarting with C2  INTERVAL HISTORY:  [name] is here for a follow up.  He presents to the clinic with his wife.  He is clinically doing well, mild fatigue, functions well at home.  He denies any nausea, chest discomfort or other new symptoms.  No fever or chills.  All other systems were reviewed with the patient and are negative.  MEDICAL HISTORY:  Past Medical History:  Diagnosis Date  . Cancer ([place])   . Goals of care, counseling/discussion 05/06/2020  . Hypertension   . Knee pain, right     SURGICAL HISTORY: Past Surgical History:  Procedure Laterality Date  . FOOT SURGERY    . IR IMAGING GUIDED PORT INSERTION  05/28/2020  . TONSILLECTOMY      I have reviewed the social history and family history with the patient and they are unchanged from previous note.  ALLERGIES:  has No Known Allergies.  MEDICATIONS:  Current Outpatient Medications  Medication Sig Dispense Refill  . allopurinol (ZYLOPRIM) 100 MG tablet TAKE 1 TABLET(100 MG) BY MOUTH DAILY 90 tablet 1  . Ascorbic Acid (VITAMIN C WITH ROSE HIPS) 1000 MG tablet Take 1,000 mg by mouth daily.    Marland Kitchen aspirin EC 81 MG tablet Take 81 mg by mouth daily.    . diphenhydrAMINE (BENADRYL) 25 mg capsule Take 1 capsule (25 mg total) by mouth at bedtime as needed for sleep. (Patient not taking: No sig reported) 30 capsule 0  . docusate sodium (COLACE) 100 MG capsule Take 2 capsules (200 mg total) by mouth 2 (two) times daily as needed for mild constipation. 10 capsule 0  . doxazosin (CARDURA) 4 MG tablet Take 4 mg by mouth daily.     Marland Kitchen HYDROcodone-acetaminophen (NORCO/VICODIN) 5-325 MG tablet Take 1 tablet by mouth every 6 (six) hours as needed for moderate pain. (Patient not taking: No sig reported) 30 tablet 0  . lidocaine-prilocaine (EMLA) cream Apply 1 application topically as needed. (Patient taking differently: Apply 1 application topically daily as needed (pain).) 30 g 0  . magic mouthwash w/lidocaine SOLN Take 15 mLs by mouth 4 (four) times daily as needed for mouth pain. (Patient not taking: No sig reported) 400 mL 0  . melatonin 3 MG TABS tablet Take 2 tablets (6 mg total) by mouth at bedtime as needed (sleep). (Patient taking differently: Take 6 mg by mouth at bedtime.) 30 tablet 0  . metoprolol tartrate (LOPRESSOR) 25 MG tablet Take 0.5 tablets (12.5 mg total) by mouth 2 (two) times daily. 30 tablet 0  . Multiple Vitamins-Minerals (MULTIVITAMIN PO) Take 1 tablet by mouth daily.    . Nutritional Supplements (BOOST MAX PROTEIN PO) Take 237 mLs by mouth daily. Chocolate    . Omega-3 Fatty Acids (OMEGA 3 PO) Take 1 tablet by mouth daily.    . ondansetron (ZOFRAN) 8 MG tablet Take 1 tablet (8 mg total) by mouth every 8 (eight) hours as needed for nausea or vomiting. 20 tablet 0  . pantoprazole (PROTONIX) 40 MG tablet  Take 1 tablet (40 mg total) by mouth daily. 30 tablet 3  . polyethylene glycol (MIRALAX / GLYCOLAX) 17 g packet Take 17 g by mouth daily as needed for mild constipation.    . prochlorperazine (COMPAZINE) 10 MG tablet Take 1 tablet (10 mg total) by mouth every 6 (six) hours as needed for nausea or vomiting. 30 tablet 0  . senna-docusate (SENOKOT-S) 8.6-50 MG tablet Take 2 tablets by mouth 2 (two) times daily as needed for mild constipation.    . simethicone (MYLICON) 80 MG chewable tablet Chew 1 tablet (80 mg total) by mouth 4 (four) times daily as needed for flatulence. (Patient not taking: No sig reported) 30 tablet 0  . Sodium Chloride-Sodium Bicarb (SODIUM BICARBONATE/SODIUM CHLORIDE) SOLN 1 application by  Mouth Rinse route as needed for dry mouth or mouth pain. (Patient taking differently: 1 application by Mouth Rinse route 4 (four) times daily as needed for dry mouth or mouth pain.)     No current facility-administered medications for this visit.    PHYSICAL EXAMINATION: ECOG PERFORMANCE STATUS: 1 - Symptomatic but completely ambulatory  Vitals:   07/05/20 1428  BP: (!) 115/59  Pulse: (!) 101  Resp: 16  Temp: 97.8 F (36.6 C)  SpO2: 100%   Filed Weights   07/05/20 1428  Weight: 209 lb 14.4 oz (95.2 kg)    GENERAL:alert, no distress and comfortable SKIN: skin color, texture, turgor are normal, no rashes or significant lesions EYES: normal, Conjunctiva are pink and non-injected, sclera clear NECK: supple, thyroid normal size, non-tender, without nodularity LYMPH:  no palpable lymphadenopathy in the cervical, axillary  LUNGS: clear to auscultation and percussion with normal breathing effort HEART: regular rate & rhythm and no murmurs and no lower extremity edema ABDOMEN:abdomen soft, non-tender and normal bowel sounds Musculoskeletal:no cyanosis of digits and no clubbing  NEURO: alert & oriented x 3 with fluent speech, no focal motor/sensory deficits  LABORATORY DATA:  I have reviewed the data as listed CBC Latest Ref Rng & Units 07/05/2020 06/29/2020 06/22/2020  WBC 4.0 - 10.5 K/uL 5.4 1.9(L) 4.7  Hemoglobin 13.0 - 17.0 g/dL 9.0(L) 8.8(L) 9.8(L)  Hematocrit 39.0 - 52.0 % 27.2(L) 25.6(L) 28.6(L)  Platelets 150 - 400 K/uL 114(L) 78(L) 131(L)     CMP Latest Ref Rng & Units 07/05/2020 06/29/2020 06/22/2020  Glucose 70 - 99 mg/dL 127(H) 156(H) 96  BUN 8 - 23 mg/dL 13 16 28(H)  Creatinine 0.61 - 1.24 mg/dL 0.84 0.83 0.70  Sodium 135 - 145 mmol/L 141 138 139  Potassium 3.5 - 5.1 mmol/L 3.7 3.8 3.5  Chloride 98 - 111 mmol/L 108 105 106  CO2 22 - 32 mmol/L 25 24 23   Calcium 8.9 - 10.3 mg/dL 8.0(L) 8.9 8.5(L)  Total Protein 6.5 - 8.1 g/dL 5.5(L) 5.6(L) 5.1(L)  Total Bilirubin 0.3 -  1.2 mg/dL 0.4 0.5 0.9  Alkaline Phos 38 - 126 U/L 69 67 63  AST 15 - 41 U/L 19 16 30   ALT 0 - 44 U/L 14 21 37      RADIOGRAPHIC STUDIES: I have personally reviewed the radiological images as listed and agreed with the findings in the report. No results found.   ASSESSMENT & PLAN:  John [name] is a 75 y.o. male with   1.Diffuse large B cell lymphoma, ABC Subtype, stage IV  -After worsening fatigue pt presented to ED. 04/20/20 CT CAP showedlargemediastinal mass and diffuseliver masses, and diffuse retroperitoneal lymphadenopathy. -Liver biopsy from 04/23/20 shows diffuse large B-Cell  lymphoma,ABC subtype, which carries a worse prognosis. Given his liver metastasis and LN involvement, this is stage IV. His lymphoma FISH panel isnegative fordouble hit lymphoma -I started him on Inpatient R-EPOCHq3weeksfor 6 cycles on 05/01/20. Given hishigh risk for CNS involvement, I started intrathecal treatmentwith methotrexateprophylacticallyfrom C2. -I personally reviewed and discussed his PET from 07/04/20 which showed excellent partial response to treatment, with minimal FDG uptake, pt was pleased with the results. -Lab reviewed, mild thrombocytopenia, anticipate he will recover well.  Plan to admit him to [facility] next Monday for cycle 4 chemo   2. Pancytopenia, secondary to chemo -He was given GCSF after Cycle 1.He still had pancytopenia with neutropenic fever. Hewas treated as inpatient and required blood and platelet transfusion. -He last required blood transfusion on 06/14/20.  He did not need a blood transfusion after cycle 3 chemo -Anemia improved, mild thrombocytopenia, I anticipate he will recover well this weekend  3. Fatigue, Low appetite,and significant weight loss -secondary to #1and chemotherapy -We will request inpatient nutrition consult and PT -His energy was better with C2 and has recovered well. Weight and appetite stable. -overall much better     PLAN: -PET scan reviewed, excellent partial response to treatment -Plan to admit on 1/31 for cycle 4 EPOCH chemo with same dose as cycle 3, and IT MTX on day 2, he will get COVID test on Saturday  -Rituxan and pegfilgrastim on day 8 (2/7)  -lab and f/u before cycle 5    No problem-specific Assessment & Plan notes found for this encounter.   No orders of the defined types were placed in this encounter.  All questions were answered. The patient knows to call the clinic with any problems, questions or concerns. No barriers to learning was detected. The total time spent in the appointment was 30 minutes.     Truitt Merle, MD 07/05/2020  I, Joslyn Devon, am acting as scribe for Truitt Merle, MD.   I have reviewed the above documentation for accuracy and completeness, and I agree with the above.

## 2020-07-05 ENCOUNTER — Inpatient Hospital Stay: Payer: Medicare HMO

## 2020-07-05 ENCOUNTER — Other Ambulatory Visit: Payer: Self-pay

## 2020-07-05 ENCOUNTER — Inpatient Hospital Stay: Payer: Medicare HMO | Admitting: Hematology

## 2020-07-05 VITALS — BP 115/59 | HR 101 | Temp 97.8°F | Resp 16 | Ht 71.0 in | Wt 209.9 lb

## 2020-07-05 DIAGNOSIS — Z95828 Presence of other vascular implants and grafts: Secondary | ICD-10-CM

## 2020-07-05 DIAGNOSIS — C8339 Diffuse large B-cell lymphoma, extranodal and solid organ sites: Secondary | ICD-10-CM

## 2020-07-05 DIAGNOSIS — C8338 Diffuse large B-cell lymphoma, lymph nodes of multiple sites: Secondary | ICD-10-CM

## 2020-07-05 DIAGNOSIS — Z5112 Encounter for antineoplastic immunotherapy: Secondary | ICD-10-CM | POA: Diagnosis not present

## 2020-07-05 LAB — CBC WITH DIFFERENTIAL (CANCER CENTER ONLY)
Abs Immature Granulocytes: 0.8 10*3/uL — ABNORMAL HIGH (ref 0.00–0.07)
Band Neutrophils: 10 %
Basophils Absolute: 0.1 10*3/uL (ref 0.0–0.1)
Basophils Relative: 1 %
Eosinophils Absolute: 0 10*3/uL (ref 0.0–0.5)
Eosinophils Relative: 0 %
HCT: 27.2 % — ABNORMAL LOW (ref 39.0–52.0)
Hemoglobin: 9 g/dL — ABNORMAL LOW (ref 13.0–17.0)
Lymphocytes Relative: 5 %
Lymphs Abs: 0.3 10*3/uL — ABNORMAL LOW (ref 0.7–4.0)
MCH: 30.3 pg (ref 26.0–34.0)
MCHC: 33.1 g/dL (ref 30.0–36.0)
MCV: 91.6 fL (ref 80.0–100.0)
Metamyelocytes Relative: 10 %
Monocytes Absolute: 1.1 10*3/uL — ABNORMAL HIGH (ref 0.1–1.0)
Monocytes Relative: 20 %
Myelocytes: 4 %
Neutro Abs: 3.2 10*3/uL (ref 1.7–7.7)
Neutrophils Relative %: 50 %
Platelet Count: 114 10*3/uL — ABNORMAL LOW (ref 150–400)
RBC: 2.97 MIL/uL — ABNORMAL LOW (ref 4.22–5.81)
RDW: 16.7 % — ABNORMAL HIGH (ref 11.5–15.5)
WBC Count: 5.4 10*3/uL (ref 4.0–10.5)
nRBC: 0.9 % — ABNORMAL HIGH (ref 0.0–0.2)

## 2020-07-05 LAB — CMP (CANCER CENTER ONLY)
ALT: 14 U/L (ref 0–44)
AST: 19 U/L (ref 15–41)
Albumin: 2.9 g/dL — ABNORMAL LOW (ref 3.5–5.0)
Alkaline Phosphatase: 69 U/L (ref 38–126)
Anion gap: 8 (ref 5–15)
BUN: 13 mg/dL (ref 8–23)
CO2: 25 mmol/L (ref 22–32)
Calcium: 8 mg/dL — ABNORMAL LOW (ref 8.9–10.3)
Chloride: 108 mmol/L (ref 98–111)
Creatinine: 0.84 mg/dL (ref 0.61–1.24)
GFR, Estimated: >60 mL/min (ref >60)
Glucose, Bld: 127 mg/dL — ABNORMAL HIGH (ref 70–99)
Potassium: 3.7 mmol/L (ref 3.5–5.1)
Sodium: 141 mmol/L (ref 135–145)
Total Bilirubin: 0.4 mg/dL (ref 0.3–1.2)
Total Protein: 5.5 g/dL — ABNORMAL LOW (ref 6.5–8.1)

## 2020-07-05 MED ORDER — HEPARIN SOD (PORK) LOCK FLUSH 100 UNIT/ML IV SOLN
500.0000 [IU] | Freq: Once | INTRAVENOUS | Status: AC
  Administered 2020-07-05: 500 [IU] via INTRAVENOUS
  Filled 2020-07-05: qty 5

## 2020-07-05 MED ORDER — SODIUM CHLORIDE 0.9% FLUSH
10.0000 mL | INTRAVENOUS | Status: DC | PRN
  Administered 2020-07-05: 10 mL via INTRAVENOUS
  Filled 2020-07-05: qty 10

## 2020-07-06 ENCOUNTER — Encounter: Payer: Self-pay | Admitting: Hematology

## 2020-07-06 NOTE — Addendum Note (Signed)
Addended by: Truitt Merle on: 07/06/2020 11:08 AM   Modules accepted: Orders

## 2020-07-07 ENCOUNTER — Other Ambulatory Visit (HOSPITAL_COMMUNITY)
Admission: RE | Admit: 2020-07-07 | Discharge: 2020-07-07 | Disposition: A | Discharge status: Home / Self Care | Payer: Medicare HMO | Source: Ambulatory Visit | Attending: Hematology | Admitting: Hematology

## 2020-07-07 DIAGNOSIS — Z20822 Contact with and (suspected) exposure to covid-19: Secondary | ICD-10-CM | POA: Insufficient documentation

## 2020-07-07 DIAGNOSIS — Z01812 Encounter for preprocedural laboratory examination: Secondary | ICD-10-CM | POA: Insufficient documentation

## 2020-07-07 LAB — SARS CORONAVIRUS 2 (TAT 6-24 HRS): SARS Coronavirus 2: NEGATIVE

## 2020-07-09 ENCOUNTER — Inpatient Hospital Stay (HOSPITAL_COMMUNITY)
Admission: RE | Admit: 2020-07-09 | Discharge: 2020-07-13 | DRG: 847 | Disposition: A | Discharge status: Home / Self Care | Payer: Medicare HMO | Source: Ambulatory Visit | Attending: Hematology | Admitting: Hematology

## 2020-07-09 ENCOUNTER — Ambulatory Visit: Payer: Medicare HMO | Admitting: Hematology

## 2020-07-09 ENCOUNTER — Other Ambulatory Visit: Payer: Self-pay

## 2020-07-09 ENCOUNTER — Encounter (HOSPITAL_COMMUNITY): Payer: Self-pay | Admitting: Hematology

## 2020-07-09 DIAGNOSIS — E440 Moderate protein-calorie malnutrition: Secondary | ICD-10-CM

## 2020-07-09 DIAGNOSIS — I10 Essential (primary) hypertension: Secondary | ICD-10-CM | POA: Diagnosis present

## 2020-07-09 DIAGNOSIS — K1230 Oral mucositis (ulcerative), unspecified: Secondary | ICD-10-CM

## 2020-07-09 DIAGNOSIS — N400 Enlarged prostate without lower urinary tract symptoms: Secondary | ICD-10-CM | POA: Diagnosis present

## 2020-07-09 DIAGNOSIS — G4733 Obstructive sleep apnea (adult) (pediatric): Secondary | ICD-10-CM

## 2020-07-09 DIAGNOSIS — Z20822 Contact with and (suspected) exposure to covid-19: Secondary | ICD-10-CM | POA: Diagnosis present

## 2020-07-09 DIAGNOSIS — D6481 Anemia due to antineoplastic chemotherapy: Secondary | ICD-10-CM | POA: Diagnosis present

## 2020-07-09 DIAGNOSIS — T451X5A Adverse effect of antineoplastic and immunosuppressive drugs, initial encounter: Secondary | ICD-10-CM | POA: Diagnosis present

## 2020-07-09 DIAGNOSIS — C8338 Diffuse large B-cell lymphoma, lymph nodes of multiple sites: Secondary | ICD-10-CM | POA: Diagnosis not present

## 2020-07-09 DIAGNOSIS — K5903 Drug induced constipation: Secondary | ICD-10-CM | POA: Diagnosis present

## 2020-07-09 DIAGNOSIS — E876 Hypokalemia: Secondary | ICD-10-CM | POA: Diagnosis not present

## 2020-07-09 DIAGNOSIS — Z5111 Encounter for antineoplastic chemotherapy: Principal | ICD-10-CM

## 2020-07-09 DIAGNOSIS — C8330 Diffuse large B-cell lymphoma, unspecified site: Secondary | ICD-10-CM | POA: Diagnosis present

## 2020-07-09 LAB — COMPREHENSIVE METABOLIC PANEL
ALT: 17 U/L (ref 0–44)
AST: 18 U/L (ref 15–41)
Albumin: 3 g/dL — ABNORMAL LOW (ref 3.5–5.0)
Alkaline Phosphatase: 55 U/L (ref 38–126)
Anion gap: 10 (ref 5–15)
BUN: 17 mg/dL (ref 8–23)
CO2: 23 mmol/L (ref 22–32)
Calcium: 8.2 mg/dL — ABNORMAL LOW (ref 8.9–10.3)
Chloride: 106 mmol/L (ref 98–111)
Creatinine, Ser: 0.84 mg/dL (ref 0.61–1.24)
GFR, Estimated: >60 mL/min (ref >60)
Glucose, Bld: 153 mg/dL — ABNORMAL HIGH (ref 70–99)
Potassium: 3.6 mmol/L (ref 3.5–5.1)
Sodium: 139 mmol/L (ref 135–145)
Total Bilirubin: 0.5 mg/dL (ref 0.3–1.2)
Total Protein: 5.6 g/dL — ABNORMAL LOW (ref 6.5–8.1)

## 2020-07-09 LAB — CBC WITH DIFFERENTIAL/PLATELET
Abs Immature Granulocytes: 0.2 10*3/uL — ABNORMAL HIGH (ref 0.00–0.07)
Basophils Absolute: 0.1 10*3/uL (ref 0.0–0.1)
Basophils Relative: 2 %
Eosinophils Absolute: 0.1 10*3/uL (ref 0.0–0.5)
Eosinophils Relative: 1 %
HCT: 27.3 % — ABNORMAL LOW (ref 39.0–52.0)
Hemoglobin: 9 g/dL — ABNORMAL LOW (ref 13.0–17.0)
Immature Granulocytes: 4 %
Lymphocytes Relative: 8 %
Lymphs Abs: 0.4 10*3/uL — ABNORMAL LOW (ref 0.7–4.0)
MCH: 30.4 pg (ref 26.0–34.0)
MCHC: 33 g/dL (ref 30.0–36.0)
MCV: 92.2 fL (ref 80.0–100.0)
Monocytes Absolute: 0.7 10*3/uL (ref 0.1–1.0)
Monocytes Relative: 14 %
Neutro Abs: 3.4 10*3/uL (ref 1.7–7.7)
Neutrophils Relative %: 71 %
Platelets: 122 10*3/uL — ABNORMAL LOW (ref 150–400)
RBC: 2.96 MIL/uL — ABNORMAL LOW (ref 4.22–5.81)
RDW: 16.5 % — ABNORMAL HIGH (ref 11.5–15.5)
WBC: 4.8 10*3/uL (ref 4.0–10.5)
nRBC: 0.6 % — ABNORMAL HIGH (ref 0.0–0.2)

## 2020-07-09 LAB — PROTIME-INR
INR: 1.2 (ref 0.8–1.2)
Prothrombin Time: 15 seconds (ref 11.4–15.2)

## 2020-07-09 LAB — APTT: aPTT: 35 seconds (ref 24–36)

## 2020-07-09 MED ORDER — HYDROCORTISONE (PERIANAL) 2.5 % EX CREA: 1.0000 "application " | TOPICAL_CREAM | Freq: Two times a day (BID) | CUTANEOUS | Status: DC | PRN

## 2020-07-09 MED ORDER — SODIUM CHLORIDE 0.9 % IV SOLN
8.0000 mg | Freq: Three times a day (TID) | INTRAVENOUS | Status: DC | PRN
  Filled 2020-07-09: qty 4

## 2020-07-09 MED ORDER — ACETAMINOPHEN 325 MG PO TABS: 650.0000 mg | ORAL_TABLET | ORAL | Status: DC | PRN

## 2020-07-09 MED ORDER — ASPIRIN 81 MG PO CHEW: 81.0000 mg | CHEWABLE_TABLET | Freq: Every day | ORAL | Status: DC

## 2020-07-09 MED ORDER — ALUM & MAG HYDROXIDE-SIMETH 200-200-20 MG/5ML PO SUSP: 60.0000 mL | ORAL | Status: DC | PRN

## 2020-07-09 MED ORDER — VINCRISTINE SULFATE CHEMO INJECTION 1 MG/ML
Freq: Once | INTRAVENOUS | Status: AC
  Filled 2020-07-09: qty 8

## 2020-07-09 MED ORDER — POLYETHYLENE GLYCOL 3350 17 G PO PACK
17.0000 g | PACK | Freq: Every day | ORAL | Status: DC
  Administered 2020-07-10 – 2020-07-13 (×4): 17 g via ORAL
  Filled 2020-07-09 (×4): qty 1

## 2020-07-09 MED ORDER — ONDANSETRON HCL 4 MG/2ML IJ SOLN: 4.0000 mg | Freq: Three times a day (TID) | INTRAMUSCULAR | Status: DC | PRN

## 2020-07-09 MED ORDER — SODIUM BICARBONATE/SODIUM CHLORIDE MOUTHWASH
Freq: Four times a day (QID) | OROMUCOSAL | Status: DC
  Administered 2020-07-12: 1 via OROMUCOSAL
  Filled 2020-07-09: qty 1000

## 2020-07-09 MED ORDER — ALLOPURINOL 100 MG PO TABS
100.0000 mg | ORAL_TABLET | Freq: Every day | ORAL | Status: DC
  Administered 2020-07-10 – 2020-07-13 (×4): 100 mg via ORAL
  Filled 2020-07-09 (×4): qty 1

## 2020-07-09 MED ORDER — GUAIFENESIN-DM 100-10 MG/5ML PO SYRP: 10.0000 mL | ORAL_SOLUTION | ORAL | Status: DC | PRN

## 2020-07-09 MED ORDER — PANTOPRAZOLE SODIUM 40 MG PO TBEC
40.0000 mg | DELAYED_RELEASE_TABLET | Freq: Every day | ORAL | Status: DC
  Administered 2020-07-10 – 2020-07-13 (×4): 40 mg via ORAL
  Filled 2020-07-09 (×5): qty 1

## 2020-07-09 MED ORDER — ONDANSETRON HCL 4 MG PO TABS: 4.0000 mg | ORAL_TABLET | Freq: Three times a day (TID) | ORAL | Status: DC | PRN

## 2020-07-09 MED ORDER — MELATONIN 5 MG PO TABS
5.0000 mg | ORAL_TABLET | Freq: Every day | ORAL | Status: DC
  Administered 2020-07-09 – 2020-07-12 (×4): 5 mg via ORAL
  Filled 2020-07-09 (×4): qty 1

## 2020-07-09 MED ORDER — ONDANSETRON 4 MG PO TBDP: 4.0000 mg | ORAL_TABLET | Freq: Three times a day (TID) | ORAL | Status: DC | PRN

## 2020-07-09 MED ORDER — METOPROLOL TARTRATE 25 MG PO TABS
12.5000 mg | ORAL_TABLET | Freq: Two times a day (BID) | ORAL | Status: DC
  Administered 2020-07-09 – 2020-07-13 (×8): 12.5 mg via ORAL
  Filled 2020-07-09 (×8): qty 1

## 2020-07-09 MED ORDER — SODIUM CHLORIDE 0.9 % IV SOLN
Freq: Once | INTRAVENOUS | Status: AC
  Administered 2020-07-09: 8 mg via INTRAVENOUS
  Filled 2020-07-09: qty 4

## 2020-07-09 MED ORDER — SENNOSIDES-DOCUSATE SODIUM 8.6-50 MG PO TABS
2.0000 | ORAL_TABLET | Freq: Two times a day (BID) | ORAL | Status: DC
  Administered 2020-07-09 – 2020-07-13 (×8): 2 via ORAL
  Filled 2020-07-09 (×8): qty 2

## 2020-07-09 MED ORDER — DOXAZOSIN MESYLATE 4 MG PO TABS
4.0000 mg | ORAL_TABLET | Freq: Every day | ORAL | Status: DC
  Administered 2020-07-10 – 2020-07-13 (×4): 4 mg via ORAL
  Filled 2020-07-09 (×4): qty 1

## 2020-07-09 MED ORDER — OXYCODONE HCL 5 MG PO TABS: 5.0000 mg | ORAL_TABLET | ORAL | Status: DC | PRN

## 2020-07-09 NOTE — H&P (Signed)
[facility]  [phone] Fax:([phone]) [place] H&P   Reason for Admission: Cycle #4 EPOCH-R  HPI: Mr. Sperl is a 75 year old Caucasian male, with past medical history of hypertension, BPH.  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  The patient has received 3 cycle EPOCH-R for treatment of his lymphoma.  He developed significant for leukemia, mucositis, and required dose reduction subsequently.  He tolerated this cycle 2 and 3 very well.  He also received intrathecal methotrexate on cycle 2 next week. His restaging PET scan showed exellent near complete response. He is clinically doing very week.   The patient is seen this morning for admission to the hospital.  He is clinically doing very well, no pain, nausea, fever, chills, bleeding, or other discomfort.  His constipation is well managed with medicine.  He has mild fatigue, but able to function well at home. He denies peripheral neuropathy he patient is seen today for admission for cycle #4 of EPOCH.  Past Medical History:  Diagnosis Date  . Cancer ([place])   . Goals of care, counseling/discussion 05/06/2020  . Hypertension   . Knee pain, right     Socioeconomic History  . Marital status: Married    Spouse name: Not on file  . Number of children: 0  . Years of education: Not on file  . Highest education level: Not on file  Occupational History  . Occupation: retired Customer service manager   Tobacco Use  . Smoking status: Never Smoker  . Smokeless tobacco: Never Used  Vaping Use  . Vaping Use: Never used  Substance and Sexual Activity  . Alcohol use: No  . Drug use: No  . Sexual activity: Not on file  Other Topics Concern  . Not on file  Social History Narrative  . Not on file   Social Determinants of Health   Financial Resource Strain: Not on file  Food Insecurity: Not on file  Transportation Needs: Not on file   Physical Activity: Not on file  Stress: Not on file  Social Connections: Not on file  Intimate Partner Violence: Not on file  :  Review of Systems: A comprehensive 14 point review of systems was negative except as noted in the HPI.  Exam: Patient Vitals for the past 24 hrs: BP 118/61 (BP Location: Left Arm)   Pulse 91   Temp 98 F (36.7 C) (Oral)   Resp 16   SpO2 99%   General:  well-nourished in no acute distress.   Eyes:  no scleral icterus.   ENT:  There were no oropharyngeal lesions.     Respiratory: lungs were clear bilaterally without wheezing or crackles.   Cardiovascular:  Regular rate and rhythm, S1/S2, without murmur, rub or gallop.  No pedal edema bilaterally. GI:  abdomen was soft, flat, nontender, nondistended, without organomegaly.   Musculoskeletal: Strength symmetrical in the upper and lower extremities. Skin exam was without echymosis, petichae.   Neuro exam was nonfocal. Patient was alert and oriented.  Attention was good.   Language was appropriate.  Mood was normal without depression.  Speech was not pressured.  Thought content was not tangential.    CBC    Component Value Date/Time   WBC 5.4 07/05/2020 1407   WBC 4.7 06/22/2020 0515   RBC 2.97 (L) 07/05/2020 1407   HGB 9.0 (L) 07/05/2020 1407   HCT 27.2 (L) 07/05/2020  1407   PLT 114 (L) 07/05/2020 1407   MCV 91.6 07/05/2020 1407   MCH 30.3 07/05/2020 1407   MCHC 33.1 07/05/2020 1407   RDW 16.7 (H) 07/05/2020 1407   LYMPHSABS 0.3 (L) 07/05/2020 1407   MONOABS 1.1 (H) 07/05/2020 1407   EOSABS 0.0 07/05/2020 1407   BASOSABS 0.1 07/05/2020 1407   CMP Latest Ref Rng & Units 07/05/2020 06/29/2020 06/22/2020  Glucose 70 - 99 mg/dL 127(H) 156(H) 96  BUN 8 - 23 mg/dL 13 16 28(H)  Creatinine 0.61 - 1.24 mg/dL 0.84 0.83 0.70  Sodium 135 - 145 mmol/L 141 138 139  Potassium 3.5 - 5.1 mmol/L 3.7 3.8 3.5  Chloride 98 - 111 mmol/L 108 105 106  CO2 22 - 32 mmol/L 25 24 23   Calcium 8.9 - 10.3 mg/dL 8.0(L) 8.9  8.5(L)  Total Protein 6.5 - 8.1 g/dL 5.5(L) 5.6(L) 5.1(L)  Total Bilirubin 0.3 - 1.2 mg/dL 0.4 0.5 0.9  Alkaline Phos 38 - 126 U/L 69 67 63  AST 15 - 41 U/L 19 16 30   ALT 0 - 44 U/L 14 21 37    DG FLUORO GUIDED NEEDLE PLC ASPIRATION/INJECTION LOC  Result Date: 05/30/2020 CLINICAL DATA:  Intrathecal methotrexate injection for lymphoma. EXAM: FLUOROSCOPICALLY GUIDED LUMBAR PUNCTURE FOR INTRATHECAL CHEMOTHERAPY FLUOROSCOPY TIME:  1 minutes 0 seconds. PROCEDURE: Informed consent was obtained from the patient prior to the procedure, including potential complications of headache, allergy, and pain. With the patient prone, the lower back was prepped with Betadine. 1% Lidocaine was used for local anesthesia. Lumbar puncture was performed at the L3-4 level using a gauge needle with return of initially blood tinged but then clearCSF. 12 mg of methotrexate was injected into the subarachnoid space. The patient tolerated the procedure well without apparent complication. IMPRESSION: Successful intrathecal methotrexate injection under fluoroscopy. Electronically Signed   By: Lorin Picket M.D.   On: 05/30/2020 13:10   IR IMAGING GUIDED PORT INSERTION  Result Date: 05/28/2020 INDICATION: Recent diagnosis of large B-cell lymphoma. In need of durable intravenous access for chemotherapy administration. EXAM: IMPLANTED PORT A CATH PLACEMENT WITH ULTRASOUND AND FLUOROSCOPIC GUIDANCE COMPARISON:  CT the chest, abdomen and pelvis-04/20/2020 MEDICATIONS: Ancef 2 gm IV; The antibiotic was administered within an appropriate time interval prior to skin puncture. ANESTHESIA/SEDATION: Moderate (conscious) sedation was employed during this procedure. A total of Versed 4 mg and Fentanyl 100 mcg was administered intravenously. Moderate Sedation Time: 27 minutes. The patient's level of consciousness and vital signs were monitored continuously by radiology nursing throughout the procedure under my direct supervision. CONTRAST:   None FLUOROSCOPY TIME:  18 seconds (8 mGy) COMPLICATIONS: None immediate. PROCEDURE: The procedure, risks, benefits, and alternatives were explained to the patient. Questions regarding the procedure were encouraged and answered. The patient understands and consents to the procedure. The right neck and chest were prepped with chlorhexidine in a sterile fashion, and a sterile drape was applied covering the operative field. Maximum barrier sterile technique with sterile gowns and gloves were used for the procedure. A timeout was performed prior to the initiation of the procedure. Local anesthesia was provided with 1% lidocaine with epinephrine. After creating a small venotomy incision, a micropuncture kit was utilized to access the internal jugular vein. Real-time ultrasound guidance was utilized for vascular access including the acquisition of a permanent ultrasound image documenting patency of the accessed vessel. The microwire was utilized to measure appropriate catheter length. A subcutaneous port pocket was then created along the upper chest wall utilizing a  combination of sharp and blunt dissection. The pocket was irrigated with sterile saline. A single lumen "Slim" sized power injectable port was chosen for placement. The 8 Fr catheter was tunneled from the port pocket site to the venotomy incision. The port was placed in the pocket. The external catheter was trimmed to appropriate length. At the venotomy, an 8 Fr peel-away sheath was placed over a guidewire under fluoroscopic guidance. The catheter was then placed through the sheath and the sheath was removed. Final catheter positioning was confirmed and documented with a fluoroscopic spot radiograph. The port was accessed with a Huber needle, aspirated and flushed with heparinized saline. The venotomy site was closed with an interrupted 4-0 Vicryl suture. The port pocket incision was closed with interrupted 2-0 Vicryl suture. The skin was opposed with a  running subcuticular 4-0 Vicryl suture. Dermabond and Steri-strips were applied to both incisions. Dressings were applied. The patient tolerated the procedure well without immediate post procedural complication. FINDINGS: After catheter placement, the tip lies within the superior cavoatrial junction. The catheter aspirates and flushes normally and is ready for immediate use. The port a Catheter was left accessed for the patient's impending hospitalization and initiation of chemotherapy. IMPRESSION: Successful placement of a right internal jugular approach power injectable Port-A-Cath. The catheter is ready for immediate use. Electronically Signed   By: Sandi Mariscal M.D.   On: 05/28/2020 10:55     DG FLUORO GUIDED NEEDLE PLC ASPIRATION/INJECTION LOC  Result Date: 05/30/2020 CLINICAL DATA:  Intrathecal methotrexate injection for lymphoma. EXAM: FLUOROSCOPICALLY GUIDED LUMBAR PUNCTURE FOR INTRATHECAL CHEMOTHERAPY FLUOROSCOPY TIME:  1 minutes 0 seconds. PROCEDURE: Informed consent was obtained from the patient prior to the procedure, including potential complications of headache, allergy, and pain. With the patient prone, the lower back was prepped with Betadine. 1% Lidocaine was used for local anesthesia. Lumbar puncture was performed at the L3-4 level using a gauge needle with return of initially blood tinged but then clearCSF. 12 mg of methotrexate was injected into the subarachnoid space. The patient tolerated the procedure well without apparent complication. IMPRESSION: Successful intrathecal methotrexate injection under fluoroscopy. Electronically Signed   By: Lorin Picket M.D.   On: 05/30/2020 13:10   IR IMAGING GUIDED PORT INSERTION  Result Date: 05/28/2020 INDICATION: Recent diagnosis of large B-cell lymphoma. In need of durable intravenous access for chemotherapy administration. EXAM: IMPLANTED PORT A CATH PLACEMENT WITH ULTRASOUND AND FLUOROSCOPIC GUIDANCE COMPARISON:  CT the chest, abdomen and  pelvis-04/20/2020 MEDICATIONS: Ancef 2 gm IV; The antibiotic was administered within an appropriate time interval prior to skin puncture. ANESTHESIA/SEDATION: Moderate (conscious) sedation was employed during this procedure. A total of Versed 4 mg and Fentanyl 100 mcg was administered intravenously. Moderate Sedation Time: 27 minutes. The patient's level of consciousness and vital signs were monitored continuously by radiology nursing throughout the procedure under my direct supervision. CONTRAST:  None FLUOROSCOPY TIME:  18 seconds (8 mGy) COMPLICATIONS: None immediate. PROCEDURE: The procedure, risks, benefits, and alternatives were explained to the patient. Questions regarding the procedure were encouraged and answered. The patient understands and consents to the procedure. The right neck and chest were prepped with chlorhexidine in a sterile fashion, and a sterile drape was applied covering the operative field. Maximum barrier sterile technique with sterile gowns and gloves were used for the procedure. A timeout was performed prior to the initiation of the procedure. Local anesthesia was provided with 1% lidocaine with epinephrine. After creating a small venotomy incision, a micropuncture kit was utilized to access  the internal jugular vein. Real-time ultrasound guidance was utilized for vascular access including the acquisition of a permanent ultrasound image documenting patency of the accessed vessel. The microwire was utilized to measure appropriate catheter length. A subcutaneous port pocket was then created along the upper chest wall utilizing a combination of sharp and blunt dissection. The pocket was irrigated with sterile saline. A single lumen "Slim" sized power injectable port was chosen for placement. The 8 Fr catheter was tunneled from the port pocket site to the venotomy incision. The port was placed in the pocket. The external catheter was trimmed to appropriate length. At the venotomy, an 8 Fr  peel-away sheath was placed over a guidewire under fluoroscopic guidance. The catheter was then placed through the sheath and the sheath was removed. Final catheter positioning was confirmed and documented with a fluoroscopic spot radiograph. The port was accessed with a Huber needle, aspirated and flushed with heparinized saline. The venotomy site was closed with an interrupted 4-0 Vicryl suture. The port pocket incision was closed with interrupted 2-0 Vicryl suture. The skin was opposed with a running subcuticular 4-0 Vicryl suture. Dermabond and Steri-strips were applied to both incisions. Dressings were applied. The patient tolerated the procedure well without immediate post procedural complication. FINDINGS: After catheter placement, the tip lies within the superior cavoatrial junction. The catheter aspirates and flushes normally and is ready for immediate use. The port a Catheter was left accessed for the patient's impending hospitalization and initiation of chemotherapy. IMPRESSION: Successful placement of a right internal jugular approach power injectable Port-A-Cath. The catheter is ready for immediate use. Electronically Signed   By: Sandi Mariscal M.D.   On: 05/28/2020 10:55   Assessment and Plan:   1.  Stage IVa diffuse large B-cell lymphoma, ABC subtype, s/p 3 cycle R-EPOCH with excellent response  2.  Fatigue secondary #1 3.  Anemia secondary to recent chemotherapy 4. HTN 5. Drug induced constipation   -He is clinically doing well -will repeat CBC and CMP this morning before chemo, then monitor daily  -He is also at high risk for CNS involvement due to high risk features and will plan to begin intrathecal methotrexate with this cycle tomorrow.  Will ask for radiology to administer this on day 2 or day 3 of the cycle of chemotherapy. -Aspirin has been placed on hold in anticipation of intrathecal methotrexate.  Prophylactic Lovenox has not been ordered due to impending procedure.  Will  reevaluate post procedure.  SCDs for DVT prophylaxis. -We will continue home bowel regimen including Miralax and Senokot. -He has Zofran and Compazine as needed for nausea and vomiting. -Continue allopurinol and other home medications. -Sodium bicarbonate mouth rinses and Magic mouthwash have been ordered. -Will order intrathecal methotrexate administration by radiology on day 2 or day 3 of this cycle of chemotherapy. -He will need Neulasta and Rituxan next Monday in my office    Truitt Merle  07/09/2020

## 2020-07-09 NOTE — Progress Notes (Signed)
Scheduling dept called to have patient scheduled for LP and IT tomorrow. Per scheduling dept if patient is admitted they only need the ordered entered and it takes care of the scheduling aspect.

## 2020-07-09 NOTE — Progress Notes (Signed)
Chemo verified with assistant director Aldean Baker.

## 2020-07-09 NOTE — Progress Notes (Signed)
Per MD ok to treat and she removed PO prednisone and kept doses same as C3

## 2020-07-10 ENCOUNTER — Inpatient Hospital Stay (HOSPITAL_COMMUNITY): Payer: Medicare HMO

## 2020-07-10 DIAGNOSIS — C8338 Diffuse large B-cell lymphoma, lymph nodes of multiple sites: Secondary | ICD-10-CM | POA: Diagnosis not present

## 2020-07-10 LAB — CSF CELL COUNT WITH DIFFERENTIAL
RBC Count, CSF: 2 /mm3 — ABNORMAL HIGH
Tube #: 1
WBC, CSF: 1 /mm3 (ref 0–5)

## 2020-07-10 LAB — CBC WITH DIFFERENTIAL/PLATELET
Abs Immature Granulocytes: 0.11 10*3/uL — ABNORMAL HIGH (ref 0.00–0.07)
Basophils Absolute: 0 10*3/uL (ref 0.0–0.1)
Basophils Relative: 1 %
Eosinophils Absolute: 0 10*3/uL (ref 0.0–0.5)
Eosinophils Relative: 0 %
HCT: 27.7 % — ABNORMAL LOW (ref 39.0–52.0)
Hemoglobin: 9 g/dL — ABNORMAL LOW (ref 13.0–17.0)
Immature Granulocytes: 2 %
Lymphocytes Relative: 4 %
Lymphs Abs: 0.3 10*3/uL — ABNORMAL LOW (ref 0.7–4.0)
MCH: 30.2 pg (ref 26.0–34.0)
MCHC: 32.5 g/dL (ref 30.0–36.0)
MCV: 93 fL (ref 80.0–100.0)
Monocytes Absolute: 0.7 10*3/uL (ref 0.1–1.0)
Monocytes Relative: 9 %
Neutro Abs: 5.9 10*3/uL (ref 1.7–7.7)
Neutrophils Relative %: 84 %
Platelets: 127 10*3/uL — ABNORMAL LOW (ref 150–400)
RBC: 2.98 MIL/uL — ABNORMAL LOW (ref 4.22–5.81)
RDW: 16.3 % — ABNORMAL HIGH (ref 11.5–15.5)
WBC: 7 10*3/uL (ref 4.0–10.5)
nRBC: 0.6 % — ABNORMAL HIGH (ref 0.0–0.2)

## 2020-07-10 LAB — COMPREHENSIVE METABOLIC PANEL
ALT: 19 U/L (ref 0–44)
AST: 20 U/L (ref 15–41)
Albumin: 3 g/dL — ABNORMAL LOW (ref 3.5–5.0)
Alkaline Phosphatase: 52 U/L (ref 38–126)
Anion gap: 9 (ref 5–15)
BUN: 23 mg/dL (ref 8–23)
CO2: 23 mmol/L (ref 22–32)
Calcium: 8.2 mg/dL — ABNORMAL LOW (ref 8.9–10.3)
Chloride: 108 mmol/L (ref 98–111)
Creatinine, Ser: 0.73 mg/dL (ref 0.61–1.24)
GFR, Estimated: >60 mL/min (ref >60)
Glucose, Bld: 129 mg/dL — ABNORMAL HIGH (ref 70–99)
Potassium: 3.9 mmol/L (ref 3.5–5.1)
Sodium: 140 mmol/L (ref 135–145)
Total Bilirubin: 0.7 mg/dL (ref 0.3–1.2)
Total Protein: 5.4 g/dL — ABNORMAL LOW (ref 6.5–8.1)

## 2020-07-10 LAB — GLUCOSE, CAPILLARY: Glucose-Capillary: 92 mg/dL (ref 70–99)

## 2020-07-10 MED ORDER — SODIUM CHLORIDE (PF) 0.9 % IJ SOLN
Freq: Once | INTRAMUSCULAR | Status: AC
  Filled 2020-07-10: qty 0.48

## 2020-07-10 MED ORDER — CHLORHEXIDINE GLUCONATE CLOTH 2 % EX PADS
6.0000 | MEDICATED_PAD | Freq: Every day | CUTANEOUS | Status: DC
  Administered 2020-07-10 – 2020-07-13 (×4): 6 via TOPICAL

## 2020-07-10 MED ORDER — SODIUM CHLORIDE 0.9 % IV SOLN
Freq: Once | INTRAVENOUS | Status: AC
  Administered 2020-07-10: 8 mg via INTRAVENOUS
  Filled 2020-07-10: qty 4

## 2020-07-10 MED ORDER — ENOXAPARIN SODIUM 40 MG/0.4ML ~~LOC~~ SOLN
40.0000 mg | SUBCUTANEOUS | Status: DC
  Administered 2020-07-11 – 2020-07-13 (×3): 40 mg via SUBCUTANEOUS
  Filled 2020-07-10 (×3): qty 0.4

## 2020-07-10 MED ORDER — VINCRISTINE SULFATE CHEMO INJECTION 1 MG/ML
Freq: Once | INTRAVENOUS | Status: AC
  Filled 2020-07-10: qty 8

## 2020-07-10 NOTE — Progress Notes (Signed)
Labs drawn from right chest port a cath. Pt tolerated with no problems.  VWilliams,RN.

## 2020-07-10 NOTE — Progress Notes (Addendum)
HEMATOLOGY-ONCOLOGY PROGRESS NOTE  SUBJECTIVE: John [name] tolerated day 1 of cycle 4 of his chemotherapy well overall.  He is not having any problems with mucositis, chest pain, shortness of breath, abdominal pain, nausea, vomiting.  Bowels are moving without any difficulty.  No bleeding reported.  He is due to have intrathecal methotrexate later this morning.  Oncology History Overview Note  Cancer Staging Diffuse large B cell lymphoma ([place]) Staging form: Hodgkin and Non-Hodgkin Lymphoma, AJCC 8th Edition - Clinical stage from 04/23/2020: Stage IV (Diffuse large B-cell lymphoma) - Signed by John Merle, MD on 04/29/2020    Diffuse large B cell lymphoma ([place])  04/20/2020 Imaging   CT CAP  IMPRESSION: 1. Large anterior mediastinal/prevascular space mass/adenopathy. The mass extends superiorly and abuts the upper trachea. 2. Large hypodense lesion in the right lobe of the liver as well as ill-defined splenic hypodense lesions. Further characterization with MRI without and with contrast recommended. The liver lesion is amenable to percutaneous tissue sampling. 3. Extensive mesenteric and retroperitoneal adenopathy. 4. Sigmoid diverticulosis. No bowel obstruction. Normal appendix. 5. Aortic Atherosclerosis (ICD10-I70.0).   04/23/2020 Cancer Staging   Staging form: Hodgkin and Non-Hodgkin Lymphoma, AJCC 8th Edition - Clinical stage from 04/23/2020: Stage IV (Diffuse large B-cell lymphoma) - Signed by John Merle, MD on 04/29/2020   04/23/2020 Initial Biopsy   FINAL MICROSCOPIC DIAGNOSIS:   A. LIVER, RIGHT MASS, NEEDLE CORE BIOPSY:  - Large B-cell lymphoma  - See comment     COMMENT:   The sections show needle core biopsy fragments displaying effacement of  the architecture by a dense infiltrate of primarily large atypical  lymphoid appearing cells displaying vesicular chromatin and small  nucleoli.  This is associated with apoptosis and brisk mitosis.  The  appearance is diffuse  with lack of atypical follicles.  A large battery  of immunohistochemical stains was performed and shows that the atypical  lymphoid cells are positive for LCA, CD20, PAX 5, BCL-2, BCL6, CD5,  MUM-1 (partial).  The atypical lymphoid cells are negative for CD10,  CD30, CD34, CD138, cyclin D1, TdT, and EBV in situ hybridization,  synaptophysin, cytokeratin AE1/AE3, cytokeratin 20, cytokeratin 7,  cytokeratin 5/6, TTF-1, CD56, CDX2, and p40.  There is an admixed  relatively minor population of T-cells as primarily seen with CD3.  The  overall features are consistent with large B-cell lymphoma, ABC subtype.  The lymphomatous process displays expression of CD5.  This phenotype is  seen in 5-10% of mostly de novo large B-cell lymphoma cases or rarely as  a transformation of a low-grade B-cell lymphoma such as small  lymphocytic lymphoma.  Clinical correlation is recommended.    04/25/2020 Imaging   CT AP  IMPRESSION: 1. Development of high-density ascites in the abdomen and pelvis. Findings are most compatible with a small to moderate amount of hemoperitoneum and related to the recent liver biopsy. 2. Extensive lymphadenopathy throughout the abdomen and pelvis with a large mass or nodal mass near the pancreatic head. There are additional lesions involving the liver and spleen. Findings are suggestive for metastatic disease. 3. Development of small bilateral pleural effusions.   These results were called by telephone at the time of interpretation on 04/25/2020 at 1:43 pm to provider ERIC [place] who verbally acknowledged these results.   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 Marked reduction in volume of anterior mediastinal mass now with low metabolic activity ( [place] 2). 2.  Reduction in volume of retroperitoneal nodal mass adjacent the pancreas with low metabolic activity ( [place]auville 3). 3. No increased metabolic activity associated with the spleen 4. Interval contraction of hepatic hematoma post biopsy. No hypermetabolic elements within the hepatic lesion. 5. Grade 2 anterolisthesis L4 on L5.        REVIEW OF SYSTEMS:   Constitutional: Denies fevers, chills or abnormal weight loss Eyes: Denies blurriness of vision Ears, nose, mouth, throat, and face: Denies mucositis or sore throat Respiratory: Denies cough, dyspnea or wheezes Cardiovascular: Denies palpitation, chest discomfort Gastrointestinal:  Denies nausea, heartburn or change in bowel habits Skin: Denies abnormal skin rashes Lymphatics: Denies new lymphadenopathy or easy bruising Neurological:Denies numbness, tingling or new weaknesses Behavioral/Psych: Mood is stable, no new changes  Extremities: No lower extremity edema All other systems were reviewed with the patient and are negative.  I have reviewed the past medical history, past surgical history, social history and family history with the patient and they are unchanged from previous note.   PHYSICAL EXAMINATION: ECOG PERFORMANCE STATUS: 1 - Symptomatic but completely ambulatory  Vitals:   07/09/20 2106 07/10/20 0535  BP: 130/70 117/69  Pulse: (!) 105 76  Resp: 16 14  Temp: 98.6 F (37 C) 97.9 F (36.6 C)  SpO2: 97% 97%   Filed Weights   07/09/20 0824 07/10/20 0500  Weight: 92 kg 95.6 kg    Intake/Output from previous day: 01/31 0701 - 02/01 0700 In: 480 [P.O.:480] Out: -   GENERAL:alert, no distress and comfortable SKIN: skin color, texture, turgor are normal, no rashes or significant lesions EYES: normal, Conjunctiva are pink and non-injected, sclera clear OROPHARYNX:no exudate, no erythema and lips, buccal mucosa, and tongue normal  LUNGS: clear to auscultation and percussion with normal breathing effort HEART:  regular rate & rhythm and no murmurs and no lower extremity edema ABDOMEN:abdomen soft, non-tender and normal bowel sounds Musculoskeletal:no cyanosis of digits and no clubbing  NEURO: alert & oriented x 3 with fluent speech, no focal motor/sensory deficits  LABORATORY DATA:  I have reviewed the data as listed CMP Latest Ref Rng & Units 07/10/2020 07/09/2020 07/05/2020  Glucose 70 - 99 mg/dL 129(H) 153(H) 127(H)  BUN 8 - 23 mg/dL 23 17 13   Creatinine 0.61 - 1.24 mg/dL 0.73 0.84 0.84  Sodium 135 - 145 mmol/L 140 139 141  Potassium 3.5 - 5.1 mmol/L 3.9 3.6 3.7  Chloride 98 - 111 mmol/L 108 106 108  CO2 22 - 32 mmol/L 23 23 25   Calcium 8.9 - 10.3 mg/dL 8.2(L) 8.2(L) 8.0(L)  Total Protein 6.5 - 8.1 g/dL 5.4(L) 5.6(L) 5.5(L)  Total Bilirubin 0.3 - 1.2 mg/dL 0.7 0.5 0.4  Alkaline Phos 38 - 126 U/L 52 55 69  AST 15 - 41 U/L 20 18 19   ALT 0 - 44 U/L 19 17 14     Lab Results  Component Value Date   WBC 7.0 07/10/2020   HGB 9.0 (L) 07/10/2020   HCT 27.7 (L) 07/10/2020   MCV 93.0 07/10/2020   PLT 127 (L) 07/10/2020   NEUTROABS 5.9 07/10/2020    NM PET Image Initial (PI) Skull Base To Thigh  Result Date: 07/04/2020 CLINICAL DATA:  Subsequent treatment strategy for B-cell lymphoma. EXAM: NUCLEAR MEDICINE PET SKULL BASE TO THIGH TECHNIQUE: 10.1 mCi F-18 FDG was injected intravenously. Full-ring PET imaging was performed from the  skull base to thigh after the radiotracer. CT data was obtained and used for attenuation correction and anatomic localization. Fasting blood glucose: Not provided mg/dl COMPARISON:  CT 04/25/2020 FINDINGS: Mediastinal blood pool activity: SUV max 1.7 Liver activity: SUV max 3.1 NECK: No hypermetabolic lymph nodes in the neck. Incidental CT findings: none CHEST: Interval reduction in size of anterior mediastinal mass measuring 2.7 x 1.4 cm compared with 6.3 x 4.0 cm. Lesion has low metabolic activity with SUV max similar to background blood pool with SUV max equal 2.6. No  suspicious pulmonary nodules. Incidental CT findings: Port in the anterior chest wall with tip in distal SVC. ABDOMEN/PELVIS: Photopenia centrally within liver associated with the hepatic mass. High-density within the mass from recent biopsy hematoma. Mass measures 6.7 x 6.4 cm which is contracted from 9.8 by 9.5 cm. No underlying hypermetabolic activity within the mass. Previously seen nodal mass at the head of the pancreas reduced in size and difficult define on noncontrast exam measuring approximately 1.7 x 1.1 cm decreased from 4.3 x 3.3 cm. This reduced nodal mass has very low metabolic activity SUV max equal 3.4 which is similar to liver activity. No hypermetabolic adenopathy in the abdomen or pelvis. Normal volume spleen with normal metabolic activity splenule anterior to the spleen. No inguinal adenopathy. Incidental CT findings: Diverticulosis of the sigmoid colon. Atherosclerotic calcification of the aorta. Prostate enlarged SKELETON: No hypermetabolic lesions in the spine. Incidental CT findings: Grade 2 anterolisthesis of L4 on L5. Probable bilateral pars defects. IMPRESSION: 1. Marked reduction in volume of anterior mediastinal mass now with low metabolic activity ( Deauville 2). 2. Reduction in volume of retroperitoneal nodal mass adjacent the pancreas with low metabolic activity ( Deau[place] 3). 3. No increased metabolic activity associated with the spleen 4. Interval contraction of hepatic hematoma post biopsy. No hypermetabolic elements within the hepatic lesion. 5. Grade 2 anterolisthesis L4 on L5. Electronically Signed   By: Suzy Bouchard M.D.   On: 07/04/2020 09:20   DG FLUORO GUIDED LOC OF NEEDLE/CATH TIP FOR SPINAL INJECT LT  Result Date: 06/19/2020 CLINICAL DATA:  Large B-cell lymphoma. EXAM: FLUOROSCOPICALLY GUIDED LUMBAR PUNCTURE FOR INTRATHECAL CHEMOTHERAPY FLUOROSCOPY TIME:  0 minutes and 30 seconds. PROCEDURE: Informed consent was obtained from the patient prior to the procedure,  including potential complications of headache, bleeding, infection and damage normal structures. With the patient prone when appropriate site was marked on the patient's skin at L4-5. The lower back was prepped with Betadine. 1% Lidocaine was used for local anesthesia. Lumbar puncture was performed at the L4-5 level using a 20 gauge spinal needle with return of clearCSF. 3 cc of CSF was removed for appropriate laboratory evaluation/cytology. 12 mg of methotrexate was injected into the subarachnoid space. The patient tolerated the procedure well without apparent complication. IMPRESSION: Fluoroscopic guided lumbar puncture and installation of intrathecal methotrexate. Electronically Signed   By: Marijo Sanes M.D.   On: 06/19/2020 14:06    ASSESSMENT AND PLAN: 1.  Stage IVa diffuse large B-cell lymphoma, ABC subtype, s/p 3 cycle R-EPOCH with excellent response  2.  Fatigue secondary #1 3.  Anemia secondary to recent chemotherapy 4. HTN 5. Drug induced constipation   -He is clinically doing well, tolerated day 1 of cycle 4 of his chemotherapy well overall. -Labs from this morning have been reviewed and remained stable.  Continue daily CBC with differential and CMET. -He is also at high risk for CNS involvement due to high risk features.  Order has been placed  for intrathecal methotrexate to be administered later today or tomorrow. -Aspirin has been placed on hold in anticipation of intrathecal methotrexate.  Prophylactic Lovenox has not been ordered due to impending procedure.  Will reevaluate post procedure.  SCDs for DVT prophylaxis. -We will continue home bowel regimen including Miralax and Senokot. -He has Zofran and Compazine as needed for nausea and vomiting. -Continue allopurinol and other home medications. -Sodium bicarbonate mouth rinses and Magic mouthwash have been ordered. -Rituxan and Neulasta are scheduled for 07/16/2020.  Labs are scheduled for 07/19/2020 and labs and follow-up visit are  scheduled for 07/26/2020.      LOS: 1 day   John Bussing, DNP, AGPCNP-BC, AOCNP 07/10/20   Addendum  I have seen the patient today. I agree with the assessment and and plan and have edited the notes.   Pt is tolerating chemo well. Lab reviewed, adequate for treatment, will proceed for day 2 chemo today. Plan to do LP by radiology for IT MTX today. Will start lovenox 40mg  daily tonight after procedure today.   John [name]  07/10/2020

## 2020-07-11 ENCOUNTER — Encounter (HOSPITAL_COMMUNITY): Payer: Self-pay | Admitting: Hematology

## 2020-07-11 DIAGNOSIS — C8330 Diffuse large B-cell lymphoma, unspecified site: Secondary | ICD-10-CM

## 2020-07-11 LAB — COMPREHENSIVE METABOLIC PANEL
ALT: 21 U/L (ref 0–44)
AST: 22 U/L (ref 15–41)
Albumin: 3 g/dL — ABNORMAL LOW (ref 3.5–5.0)
Alkaline Phosphatase: 51 U/L (ref 38–126)
Anion gap: 9 (ref 5–15)
BUN: 23 mg/dL (ref 8–23)
CO2: 22 mmol/L (ref 22–32)
Calcium: 8 mg/dL — ABNORMAL LOW (ref 8.9–10.3)
Chloride: 110 mmol/L (ref 98–111)
Creatinine, Ser: 0.7 mg/dL (ref 0.61–1.24)
GFR, Estimated: >60 mL/min (ref >60)
Glucose, Bld: 95 mg/dL (ref 70–99)
Potassium: 3.6 mmol/L (ref 3.5–5.1)
Sodium: 141 mmol/L (ref 135–145)
Total Bilirubin: 0.7 mg/dL (ref 0.3–1.2)
Total Protein: 5.3 g/dL — ABNORMAL LOW (ref 6.5–8.1)

## 2020-07-11 LAB — CBC WITH DIFFERENTIAL/PLATELET
Abs Immature Granulocytes: 0.07 10*3/uL (ref 0.00–0.07)
Basophils Absolute: 0 10*3/uL (ref 0.0–0.1)
Basophils Relative: 0 %
Eosinophils Absolute: 0 10*3/uL (ref 0.0–0.5)
Eosinophils Relative: 0 %
HCT: 28.2 % — ABNORMAL LOW (ref 39.0–52.0)
Hemoglobin: 9.2 g/dL — ABNORMAL LOW (ref 13.0–17.0)
Immature Granulocytes: 1 %
Lymphocytes Relative: 5 %
Lymphs Abs: 0.3 10*3/uL — ABNORMAL LOW (ref 0.7–4.0)
MCH: 30.5 pg (ref 26.0–34.0)
MCHC: 32.6 g/dL (ref 30.0–36.0)
MCV: 93.4 fL (ref 80.0–100.0)
Monocytes Absolute: 0.6 10*3/uL (ref 0.1–1.0)
Monocytes Relative: 12 %
Neutro Abs: 4 10*3/uL (ref 1.7–7.7)
Neutrophils Relative %: 82 %
Platelets: 123 10*3/uL — ABNORMAL LOW (ref 150–400)
RBC: 3.02 MIL/uL — ABNORMAL LOW (ref 4.22–5.81)
RDW: 16.7 % — ABNORMAL HIGH (ref 11.5–15.5)
WBC: 4.9 10*3/uL (ref 4.0–10.5)
nRBC: 0.4 % — ABNORMAL HIGH (ref 0.0–0.2)

## 2020-07-11 LAB — CYTOLOGY - NON PAP

## 2020-07-11 MED ORDER — VINCRISTINE SULFATE CHEMO INJECTION 1 MG/ML
Freq: Once | INTRAVENOUS | Status: AC
  Filled 2020-07-11: qty 8

## 2020-07-11 MED ORDER — ASPIRIN EC 81 MG PO TBEC
81.0000 mg | DELAYED_RELEASE_TABLET | Freq: Every day | ORAL | Status: DC
  Administered 2020-07-12 – 2020-07-13 (×2): 81 mg via ORAL
  Filled 2020-07-11 (×2): qty 1

## 2020-07-11 MED ORDER — SODIUM CHLORIDE 0.9 % IV SOLN
Freq: Once | INTRAVENOUS | Status: AC
  Administered 2020-07-11: 18 mg via INTRAVENOUS
  Filled 2020-07-11: qty 4

## 2020-07-11 NOTE — Progress Notes (Signed)
HEMATOLOGY-ONCOLOGY PROGRESS NOTE  SUBJECTIVE: John [name] is doing well, tolerating chemo wo issues. IT went well yesterday, no headache or other complains. He had BM yesterday and again this morning. No pain or other complains.   Oncology History Overview Note  Cancer Staging Diffuse large B cell lymphoma ([place]) Staging form: Hodgkin and Non-Hodgkin Lymphoma, AJCC 8th Edition - Clinical stage from 04/23/2020: Stage IV (Diffuse large B-cell lymphoma) - Signed by Truitt Merle, MD on 04/29/2020    Diffuse large B cell lymphoma ([place])  04/20/2020 Imaging   CT CAP  IMPRESSION: 1. Large anterior mediastinal/prevascular space mass/adenopathy. The mass extends superiorly and abuts the upper trachea. 2. Large hypodense lesion in the right lobe of the liver as well as ill-defined splenic hypodense lesions. Further characterization with MRI without and with contrast recommended. The liver lesion is amenable to percutaneous tissue sampling. 3. Extensive mesenteric and retroperitoneal adenopathy. 4. Sigmoid diverticulosis. No bowel obstruction. Normal appendix. 5. Aortic Atherosclerosis (ICD10-I70.0).   04/23/2020 Cancer Staging   Staging form: Hodgkin and Non-Hodgkin Lymphoma, AJCC 8th Edition - Clinical stage from 04/23/2020: Stage IV (Diffuse large B-cell lymphoma) - Signed by Truitt Merle, MD on 04/29/2020   04/23/2020 Initial Biopsy   FINAL MICROSCOPIC DIAGNOSIS:   A. LIVER, RIGHT MASS, NEEDLE CORE BIOPSY:  - Large B-cell lymphoma  - See comment     COMMENT:   The sections show needle core biopsy fragments displaying effacement of  the architecture by a dense infiltrate of primarily large atypical  lymphoid appearing cells displaying vesicular chromatin and small  nucleoli.  This is associated with apoptosis and brisk mitosis.  The  appearance is diffuse with lack of atypical follicles.  A large battery  of immunohistochemical stains was performed and shows that the atypical  lymphoid  cells are positive for LCA, CD20, PAX 5, BCL-2, BCL6, CD5,  MUM-1 (partial).  The atypical lymphoid cells are negative for CD10,  CD30, CD34, CD138, cyclin D1, TdT, and EBV in situ hybridization,  synaptophysin, cytokeratin AE1/AE3, cytokeratin 20, cytokeratin 7,  cytokeratin 5/6, TTF-1, CD56, CDX2, and p40.  There is an admixed  relatively minor population of T-cells as primarily seen with CD3.  The  overall features are consistent with large B-cell lymphoma, ABC subtype.  The lymphomatous process displays expression of CD5.  This phenotype is  seen in 5-10% of mostly de novo large B-cell lymphoma cases or rarely as  a transformation of a low-grade B-cell lymphoma such as small  lymphocytic lymphoma.  Clinical correlation is recommended.    04/25/2020 Imaging   CT AP  IMPRESSION: 1. Development of high-density ascites in the abdomen and pelvis. Findings are most compatible with a small to moderate amount of hemoperitoneum and related to the recent liver biopsy. 2. Extensive lymphadenopathy throughout the abdomen and pelvis with a large mass or nodal mass near the pancreatic head. There are additional lesions involving the liver and spleen. Findings are suggestive for metastatic disease. 3. Development of small bilateral pleural effusions.   These results were called by telephone at the time of interpretation on 04/25/2020 at 1:43 pm to provider [place] , who verbally acknowledged these results.   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 Marked reduction in volume of anterior mediastinal mass now with low metabolic activity ( [place]  2). 2. Reduction in volume of retroperitoneal nodal mass adjacent the pancreas with low metabolic activity ( [place] 3). 3. No increased  metabolic activity associated with the spleen 4. Interval contraction of hepatic hematoma post biopsy. No hypermetabolic elements within the hepatic lesion. 5. Grade 2 anterolisthesis L4 on L5.        REVIEW OF SYSTEMS:   Constitutional: Denies fevers, chills or abnormal weight loss Eyes: Denies blurriness of vision Ears, nose, mouth, throat, and face: Denies mucositis or sore throat Respiratory: Denies cough, dyspnea or wheezes Cardiovascular: Denies palpitation, chest discomfort Gastrointestinal:  Denies nausea, heartburn or change in bowel habits Skin: Denies abnormal skin rashes Lymphatics: Denies new lymphadenopathy or easy bruising Neurological:Denies numbness, tingling or new weaknesses Behavioral/Psych: Mood is stable, no new changes  Extremities: No lower extremity edema All other systems were reviewed with the patient and are negative.  I have reviewed the past medical history, past surgical history, social history and family history with the patient and they are unchanged from previous note.   PHYSICAL EXAMINATION: ECOG PERFORMANCE STATUS: 1 - Symptomatic but completely ambulatory  Vitals:   07/11/20 1603 07/11/20 2023  BP: 131/67 134/70  Pulse: 79 81  Resp: 18 19  Temp: 97.7 F (36.5 C) (!) 97.5 F (36.4 C)  SpO2: 100% 99%   Filed Weights   07/09/20 0824 07/10/20 0500 07/11/20 0555  Weight: 202 lb 12.8 oz (92 kg) 210 lb 12.2 oz (95.6 kg) 212 lb 11.9 oz (96.5 kg)    Intake/Output from previous day: 02/01 0701 - 02/02 0700 In: 440 [P.O.:440] Out: -   GENERAL:alert, no distress and comfortable SKIN: skin color, texture, turgor are normal, no rashes or significant lesions EYES: normal, Conjunctiva are pink and non-injected, sclera clear OROPHARYNX:no exudate, no erythema and lips, buccal mucosa, and tongue normal  LUNGS: clear to auscultation and percussion with normal breathing effort HEART: regular rate & rhythm and no murmurs and no lower extremity  edema ABDOMEN:abdomen soft, non-tender and normal bowel sounds Musculoskeletal:no cyanosis of digits and no clubbing  NEURO: alert & oriented x 3 with fluent speech, no focal motor/sensory deficits  LABORATORY DATA:  I have reviewed the data as listed CMP Latest Ref Rng & Units 07/11/2020 07/10/2020 07/09/2020  Glucose 70 - 99 mg/dL 95 129(H) 153(H)  BUN 8 - 23 mg/dL 23 23 17   Creatinine 0.61 - 1.24 mg/dL 0.70 0.73 0.84  Sodium 135 - 145 mmol/L 141 140 139  Potassium 3.5 - 5.1 mmol/L 3.6 3.9 3.6  Chloride 98 - 111 mmol/L 110 108 106  CO2 22 - 32 mmol/L 22 23 23   Calcium 8.9 - 10.3 mg/dL 8.0(L) 8.2(L) 8.2(L)  Total Protein 6.5 - 8.1 g/dL 5.3(L) 5.4(L) 5.6(L)  Total Bilirubin 0.3 - 1.2 mg/dL 0.7 0.7 0.5  Alkaline Phos 38 - 126 U/L 51 52 55  AST 15 - 41 U/L 22 20 18   ALT 0 - 44 U/L 21 19 17     Lab Results  Component Value Date   WBC 4.9 07/11/2020   HGB 9.2 (L) 07/11/2020   HCT 28.2 (L) 07/11/2020   MCV 93.4 07/11/2020   PLT 123 (L) 07/11/2020   NEUTROABS 4.0 07/11/2020    NM PET Image Initial (PI) Skull Base To Thigh  Result Date: 07/04/2020 CLINICAL DATA:  Subsequent treatment strategy for B-cell lymphoma. EXAM: NUCLEAR MEDICINE PET SKULL BASE TO THIGH TECHNIQUE: 10.1 mCi F-18 FDG was injected intravenously. Full-ring PET imaging was performed from the skull base to thigh after  the radiotracer. CT data was obtained and used for attenuation correction and anatomic localization. Fasting blood glucose: Not provided mg/dl COMPARISON:  CT 04/25/2020 FINDINGS: Mediastinal blood pool activity: SUV max 1.7 Liver activity: SUV max 3.1 NECK: No hypermetabolic lymph nodes in the neck. Incidental CT findings: none CHEST: Interval reduction in size of anterior mediastinal mass measuring 2.7 x 1.4 cm compared with 6.3 x 4.0 cm. Lesion has low metabolic activity with SUV max similar to background blood pool with SUV max equal 2.6. No suspicious pulmonary nodules. Incidental CT findings: Port in the  anterior chest wall with tip in distal SVC. ABDOMEN/PELVIS: Photopenia centrally within liver associated with the hepatic mass. High-density within the mass from recent biopsy hematoma. Mass measures 6.7 x 6.4 cm which is contracted from 9.8 by 9.5 cm. No underlying hypermetabolic activity within the mass. Previously seen nodal mass at the head of the pancreas reduced in size and difficult define on noncontrast exam measuring approximately 1.7 x 1.1 cm decreased from 4.3 x 3.3 cm. This reduced nodal mass has very low metabolic activity SUV max equal 3.4 which is similar to liver activity. No hypermetabolic adenopathy in the abdomen or pelvis. Normal volume spleen with normal metabolic activity splenule anterior to the spleen. No inguinal adenopathy. Incidental CT findings: Diverticulosis of the sigmoid colon. Atherosclerotic calcification of the aorta. Prostate enlarged SKELETON: No hypermetabolic lesions in the spine. Incidental CT findings: Grade 2 anterolisthesis of L4 on L5. Probable bilateral pars defects. IMPRESSION: 1. Marked reduction in volume of anterior mediastinal mass now with low metabolic activity ( [place] 2). 2. Reduction in volume of retroperitoneal nodal mass adjacent the pancreas with low metabolic activity ( [place] 3). 3. No increased metabolic activity associated with the spleen 4. Interval contraction of hepatic hematoma post biopsy. No hypermetabolic elements within the hepatic lesion. 5. Grade 2 anterolisthesis L4 on L5. Electronically Signed   By: Suzy Bouchard M.D.   On: 07/04/2020 09:20   DG FLUORO GUIDED LOC OF NEEDLE/CATH TIP FOR SPINAL INJECT RT  Result Date: 07/10/2020 CLINICAL DATA:  Diffuse large B-cell lymphoma. For intrathecal chemotherapy. EXAM: DIAGNOSTIC LUMBAR PUNCTURE UNDER FLUOROSCOPIC GUIDANCE COMPARISON:  Intrathecal injection 06/19/2020 FLUOROSCOPY TIME:  Fluoroscopy Time:  0 minutes 24 seconds Radiation Exposure Index (if provided by the fluoroscopic device):  Number of Acquired Spot Images: 1 PROCEDURE: Informed consent was obtained from the patient prior to the procedure, including potential complications of headache, allergy, and pain. With the patient prone, the lower back was prepped with Betadine. 1% Lidocaine was used for local anesthesia. Lumbar puncture was performed at the L4-5 level using a 20 gauge needle with return of clear CSF. Five ml of CSF were obtained for laboratory studies. The patient tolerated the procedure well and there were no apparent complications. Methotrexate solution was injected in subarachnoid space without complication. IMPRESSION: Successful intrathecal methotrexate injections in fluoroscopic guidance. Electronically Signed   By: Franchot Gallo M.D.   On: 07/10/2020 12:02   DG FLUORO GUIDED LOC OF NEEDLE/CATH TIP FOR SPINAL INJECT LT  Result Date: 06/19/2020 CLINICAL DATA:  Large B-cell lymphoma. EXAM: FLUOROSCOPICALLY GUIDED LUMBAR PUNCTURE FOR INTRATHECAL CHEMOTHERAPY FLUOROSCOPY TIME:  0 minutes and 30 seconds. PROCEDURE: Informed consent was obtained from the patient prior to the procedure, including potential complications of headache, bleeding, infection and damage normal structures. With the patient prone when appropriate site was marked on the patient's skin at L4-5. The lower back was prepped with Betadine. 1% Lidocaine was used for local anesthesia. Lumbar  puncture was performed at the L4-5 level using a 20 gauge spinal needle with return of clearCSF. 3 cc of CSF was removed for appropriate laboratory evaluation/cytology. 12 mg of methotrexate was injected into the subarachnoid space. The patient tolerated the procedure well without apparent complication. IMPRESSION: Fluoroscopic guided lumbar puncture and installation of intrathecal methotrexate. Electronically Signed   By: Marijo Sanes M.D.   On: 06/19/2020 14:06    ASSESSMENT AND PLAN: 1.  Stage IVa diffuse large B-cell lymphoma, ABC subtype, s/p 3 cycle R-EPOCH  with excellent response  2.  Fatigue secondary #1 3.  Anemia secondary to recent chemotherapy 4. HTN 5. Drug induced constipation   -He is clinically doing well, tolerated day 2 of cycle 4 of his chemotherapy well overall. -Labs from this morning have been reviewed and remained stable. Will continue day 3 chemo today - Continue daily CBC with differential and CMET. -s/p intrathecal methotrexate yesterday, went well -restarted aspirin and lovenox  -continue supportive care      LOS: 2 days   Truitt Merle, MD  07/11/20

## 2020-07-12 DIAGNOSIS — C8338 Diffuse large B-cell lymphoma, lymph nodes of multiple sites: Secondary | ICD-10-CM | POA: Diagnosis not present

## 2020-07-12 LAB — COMPREHENSIVE METABOLIC PANEL
ALT: 25 U/L (ref 0–44)
AST: 26 U/L (ref 15–41)
Albumin: 2.9 g/dL — ABNORMAL LOW (ref 3.5–5.0)
Alkaline Phosphatase: 48 U/L (ref 38–126)
Anion gap: 9 (ref 5–15)
BUN: 28 mg/dL — ABNORMAL HIGH (ref 8–23)
CO2: 22 mmol/L (ref 22–32)
Calcium: 7.9 mg/dL — ABNORMAL LOW (ref 8.9–10.3)
Chloride: 108 mmol/L (ref 98–111)
Creatinine, Ser: 0.85 mg/dL (ref 0.61–1.24)
GFR, Estimated: >60 mL/min (ref >60)
Glucose, Bld: 91 mg/dL (ref 70–99)
Potassium: 3.6 mmol/L (ref 3.5–5.1)
Sodium: 139 mmol/L (ref 135–145)
Total Bilirubin: 0.7 mg/dL (ref 0.3–1.2)
Total Protein: 5.1 g/dL — ABNORMAL LOW (ref 6.5–8.1)

## 2020-07-12 LAB — CBC WITH DIFFERENTIAL/PLATELET
Abs Immature Granulocytes: 0.02 10*3/uL (ref 0.00–0.07)
Basophils Absolute: 0 10*3/uL (ref 0.0–0.1)
Basophils Relative: 0 %
Eosinophils Absolute: 0 10*3/uL (ref 0.0–0.5)
Eosinophils Relative: 0 %
HCT: 25.8 % — ABNORMAL LOW (ref 39.0–52.0)
Hemoglobin: 8.5 g/dL — ABNORMAL LOW (ref 13.0–17.0)
Immature Granulocytes: 1 %
Lymphocytes Relative: 7 %
Lymphs Abs: 0.3 10*3/uL — ABNORMAL LOW (ref 0.7–4.0)
MCH: 30.4 pg (ref 26.0–34.0)
MCHC: 32.9 g/dL (ref 30.0–36.0)
MCV: 92.1 fL (ref 80.0–100.0)
Monocytes Absolute: 0.3 10*3/uL (ref 0.1–1.0)
Monocytes Relative: 9 %
Neutro Abs: 3 10*3/uL (ref 1.7–7.7)
Neutrophils Relative %: 83 %
Platelets: 115 10*3/uL — ABNORMAL LOW (ref 150–400)
RBC: 2.8 MIL/uL — ABNORMAL LOW (ref 4.22–5.81)
RDW: 16.4 % — ABNORMAL HIGH (ref 11.5–15.5)
WBC: 3.7 10*3/uL — ABNORMAL LOW (ref 4.0–10.5)
nRBC: 0.5 % — ABNORMAL HIGH (ref 0.0–0.2)

## 2020-07-12 LAB — GLUCOSE, CAPILLARY: Glucose-Capillary: 78 mg/dL (ref 70–99)

## 2020-07-12 MED ORDER — VINCRISTINE SULFATE CHEMO INJECTION 1 MG/ML
Freq: Once | INTRAVENOUS | Status: AC
  Filled 2020-07-12: qty 8

## 2020-07-12 MED ORDER — LACTULOSE 10 GM/15ML PO SOLN: 30.0000 g | Freq: Every day | ORAL | Status: DC | PRN

## 2020-07-12 MED ORDER — SODIUM CHLORIDE 0.9 % IV SOLN
Freq: Once | INTRAVENOUS | Status: AC
  Administered 2020-07-13: 16 mg via INTRAVENOUS
  Filled 2020-07-12: qty 8

## 2020-07-12 MED ORDER — SODIUM CHLORIDE 0.9 % IV SOLN
Freq: Once | INTRAVENOUS | Status: AC
  Administered 2020-07-12: 8 mg via INTRAVENOUS
  Filled 2020-07-12: qty 4

## 2020-07-12 MED ORDER — SODIUM CHLORIDE 0.9 % IV SOLN
600.0000 mg/m2 | Freq: Once | INTRAVENOUS | Status: AC
  Administered 2020-07-13: 1280 mg via INTRAVENOUS
  Filled 2020-07-12 (×2): qty 64

## 2020-07-12 NOTE — Progress Notes (Addendum)
HEMATOLOGY-ONCOLOGY PROGRESS NOTE  SUBJECTIVE: [name] continues to do well.  Denies headaches, dizziness, abdominal pain, nausea, vomiting.  States that he has not had a bowel movement since Tuesday.  However, he reports he does not feel uncomfortable due to constipation.  Trying to eat more fiber.  Remains on MiraLAX and Senokot-S.  Oncology History Overview Note  Cancer Staging Diffuse large B cell lymphoma ([place]) Staging form: Hodgkin and Non-Hodgkin Lymphoma, AJCC 8th Edition - Clinical stage from 04/23/2020: Stage IV (Diffuse large B-cell lymphoma) - Signed by John Merle, MD on 04/29/2020    Diffuse large B cell lymphoma ([place])  04/20/2020 Imaging   CT CAP  IMPRESSION: 1. Large anterior mediastinal/prevascular space mass/adenopathy. The mass extends superiorly and abuts the upper trachea. 2. Large hypodense lesion in the right lobe of the liver as well as ill-defined splenic hypodense lesions. Further characterization with MRI without and with contrast recommended. The liver lesion is amenable to percutaneous tissue sampling. 3. Extensive mesenteric and retroperitoneal adenopathy. 4. Sigmoid diverticulosis. No bowel obstruction. Normal appendix. 5. Aortic Atherosclerosis (ICD10-I70.0).   04/23/2020 Cancer Staging   Staging form: Hodgkin and Non-Hodgkin Lymphoma, AJCC 8th Edition - Clinical stage from 04/23/2020: Stage IV (Diffuse large B-cell lymphoma) - Signed by John Merle, MD on 04/29/2020   04/23/2020 Initial Biopsy   FINAL MICROSCOPIC DIAGNOSIS:   A. LIVER, RIGHT MASS, NEEDLE CORE BIOPSY:  - Large B-cell lymphoma  - See comment     COMMENT:   The sections show needle core biopsy fragments displaying effacement of  the architecture by a dense infiltrate of primarily large atypical  lymphoid appearing cells displaying vesicular chromatin and small  nucleoli.  This is associated with apoptosis and brisk mitosis.  The  appearance is diffuse with lack of atypical  follicles.  A large battery  of immunohistochemical stains was performed and shows that the atypical  lymphoid cells are positive for LCA, CD20, PAX 5, BCL-2, BCL6, CD5,  MUM-1 (partial).  The atypical lymphoid cells are negative for CD10,  CD30, CD34, CD138, cyclin D1, TdT, and EBV in situ hybridization,  synaptophysin, cytokeratin AE1/AE3, cytokeratin 20, cytokeratin 7,  cytokeratin 5/6, TTF-1, CD56, CDX2, and p40.  There is an admixed  relatively minor population of T-cells as primarily seen with CD3.  The  overall features are consistent with large B-cell lymphoma, ABC subtype.  The lymphomatous process displays expression of CD5.  This phenotype is  seen in 5-10% of mostly de novo large B-cell lymphoma cases or rarely as  a transformation of a low-grade B-cell lymphoma such as small  lymphocytic lymphoma.  Clinical correlation is recommended.    04/25/2020 Imaging   CT AP  IMPRESSION: 1. Development of high-density ascites in the abdomen and pelvis. Findings are most compatible with a small to moderate amount of hemoperitoneum and related to the recent liver biopsy. 2. Extensive lymphadenopathy throughout the abdomen and pelvis with a large mass or nodal mass near the pancreatic head. There are additional lesions involving the liver and spleen. Findings are suggestive for metastatic disease. 3. Development of small bilateral pleural effusions.   These results were called by telephone at the time of interpretation on 04/25/2020 at 1:43 pm to provider John [name] [place] (John [name]) , who verbally acknowledged these results.   04/26/2020 Initial Diagnosis   High grade non-Hodgkin lymphoma (John [name])   05/01/2020 -  Chemotherapy   Inpatient R-EPOCH every 3 weeks for 6 cycles starting 05/01/20 with intrathecal prophylactic treatment    07/04/2020 PET scan  IMPRESSION: 1. Marked reduction in volume of anterior mediastinal mass now with low metabolic activity ( Deauville 2). 2. Reduction in volume of  retroperitoneal nodal mass adjacent the pancreas with low metabolic activity ( Deauville 3). 3. No increased metabolic activity associated with the spleen 4. Interval contraction of hepatic hematoma post biopsy. No hypermetabolic elements within the hepatic lesion. 5. Grade 2 anterolisthesis L4 on L5.        REVIEW OF SYSTEMS:   Constitutional: Denies fevers, chills or abnormal weight loss Eyes: Denies blurriness of vision Ears, nose, mouth, throat, and face: Denies mucositis or sore throat Respiratory: Denies cough, dyspnea or wheezes Cardiovascular: Denies palpitation, chest discomfort Gastrointestinal:  Denies nausea, heartburn or change in bowel habits Skin: Denies abnormal skin rashes Lymphatics: Denies new lymphadenopathy or easy bruising Neurological:Denies numbness, tingling or new weaknesses Behavioral/Psych: Mood is stable, no new changes  Extremities: No lower extremity edema All other systems were reviewed with the patient and are negative.  I have reviewed the past medical history, past surgical history, social history and family history with the patient and they are unchanged from previous note.   PHYSICAL EXAMINATION: ECOG PERFORMANCE STATUS: 1 - Symptomatic but completely ambulatory  Vitals:   07/11/20 2023 07/12/20 0459  BP: 134/70 119/77  Pulse: 81 71  Resp: 19 18  Temp: (!) 97.5 F (36.4 C) 97.8 F (36.6 C)  SpO2: 99% 100%   Filed Weights   07/09/20 0824 07/10/20 0500 07/11/20 0555  Weight: 92 kg 95.6 kg 96.5 kg    Intake/Output from previous day: 02/02 0701 - 02/03 0700 In: 1307.1 [P.O.:480; IV Piggyback:827.1] Out: -   GENERAL:alert, no distress and comfortable SKIN: skin color, texture, turgor are normal, no rashes or significant lesions EYES: normal, Conjunctiva are pink and non-injected, sclera clear OROPHARYNX:no exudate, no erythema and lips, buccal mucosa, and tongue normal  LUNGS: clear to auscultation and percussion with normal  breathing effort HEART: regular rate & rhythm and no murmurs and no lower extremity edema ABDOMEN:abdomen soft, non-tender and normal bowel sounds Musculoskeletal:no cyanosis of digits and no clubbing  NEURO: alert & oriented x 3 with fluent speech, no focal motor/sensory deficits  LABORATORY DATA:  I have reviewed the data as listed CMP Latest Ref Rng & Units 07/12/2020 07/11/2020 07/10/2020  Glucose 70 - 99 mg/dL 91 95 129(H)  BUN 8 - 23 mg/dL 28(H) 23 23  Creatinine 0.61 - 1.24 mg/dL 0.85 0.70 0.73  Sodium 135 - 145 mmol/L 139 141 140  Potassium 3.5 - 5.1 mmol/L 3.6 3.6 3.9  Chloride 98 - 111 mmol/L 108 110 108  CO2 22 - 32 mmol/L 22 22 23   Calcium 8.9 - 10.3 mg/dL 7.9(L) 8.0(L) 8.2(L)  Total Protein 6.5 - 8.1 g/dL 5.1(L) 5.3(L) 5.4(L)  Total Bilirubin 0.3 - 1.2 mg/dL 0.7 0.7 0.7  Alkaline Phos 38 - 126 U/L 48 51 52  AST 15 - 41 U/L 26 22 20   ALT 0 - 44 U/L 25 21 19     Lab Results  Component Value Date   WBC 3.7 (L) 07/12/2020   HGB 8.5 (L) 07/12/2020   HCT 25.8 (L) 07/12/2020   MCV 92.1 07/12/2020   PLT 115 (L) 07/12/2020   NEUTROABS 3.0 07/12/2020    NM PET Image Initial (PI) Skull Base To Thigh  Result Date: 07/04/2020 CLINICAL DATA:  Subsequent treatment strategy for B-cell lymphoma. EXAM: NUCLEAR MEDICINE PET SKULL BASE TO THIGH TECHNIQUE: 10.1 mCi F-18 FDG was injected intravenously. Full-ring PET imaging was  performed from the skull base to thigh after the radiotracer. CT data was obtained and used for attenuation correction and anatomic localization. Fasting blood glucose: Not provided mg/dl COMPARISON:  CT 04/25/2020 FINDINGS: Mediastinal blood pool activity: SUV max 1.7 Liver activity: SUV max 3.1 NECK: No hypermetabolic lymph nodes in the neck. Incidental CT findings: none CHEST: Interval reduction in size of anterior mediastinal mass measuring 2.7 x 1.4 cm compared with 6.3 x 4.0 cm. Lesion has low metabolic activity with SUV max similar to background blood pool with SUV  max equal 2.6. No suspicious pulmonary nodules. Incidental CT findings: Port in the anterior chest wall with tip in distal SVC. ABDOMEN/PELVIS: Photopenia centrally within liver associated with the hepatic mass. High-density within the mass from recent biopsy hematoma. Mass measures 6.7 x 6.4 cm which is contracted from 9.8 by 9.5 cm. No underlying hypermetabolic activity within the mass. Previously seen nodal mass at the head of the pancreas reduced in size and difficult define on noncontrast exam measuring approximately 1.7 x 1.1 cm decreased from 4.3 x 3.3 cm. This reduced nodal mass has very low metabolic activity SUV max equal 3.4 which is similar to liver activity. No hypermetabolic adenopathy in the abdomen or pelvis. Normal volume spleen with normal metabolic activity splenule anterior to the spleen. No inguinal adenopathy. Incidental CT findings: Diverticulosis of the sigmoid colon. Atherosclerotic calcification of the aorta. Prostate enlarged SKELETON: No hypermetabolic lesions in the spine. Incidental CT findings: Grade 2 anterolisthesis of L4 on L5. Probable bilateral pars defects. IMPRESSION: 1. Marked reduction in volume of anterior mediastinal mass now with low metabolic activity ( Deauville 2). 2. Reduction in volume of retroperitoneal nodal mass adjacent the pancreas with low metabolic activity ( Deauville 3). 3. No increased metabolic activity associated with the spleen 4. Interval contraction of hepatic hematoma post biopsy. No hypermetabolic elements within the hepatic lesion. 5. Grade 2 anterolisthesis L4 on L5. Electronically Signed   By: Suzy Bouchard M.D.   On: 07/04/2020 09:20   DG FLUORO GUIDED LOC OF NEEDLE/CATH TIP FOR SPINAL INJECT RT  Result Date: 07/10/2020 CLINICAL DATA:  Diffuse large B-cell lymphoma. For intrathecal chemotherapy. EXAM: DIAGNOSTIC LUMBAR PUNCTURE UNDER FLUOROSCOPIC GUIDANCE COMPARISON:  Intrathecal injection 06/19/2020 FLUOROSCOPY TIME:  Fluoroscopy Time:  0  minutes 24 seconds Radiation Exposure Index (if provided by the fluoroscopic device): Number of Acquired Spot Images: 1 PROCEDURE: Informed consent was obtained from the patient prior to the procedure, including potential complications of headache, allergy, and pain. With the patient prone, the lower back was prepped with Betadine. 1% Lidocaine was used for local anesthesia. Lumbar puncture was performed at the L4-5 level using a 20 gauge needle with return of clear CSF. Five ml of CSF were obtained for laboratory studies. The patient tolerated the procedure well and there were no apparent complications. Methotrexate solution was injected in subarachnoid space without complication. IMPRESSION: Successful intrathecal methotrexate injections in fluoroscopic guidance. Electronically Signed   By: Franchot Gallo M.D.   On: 07/10/2020 12:02   DG FLUORO GUIDED LOC OF NEEDLE/CATH TIP FOR SPINAL INJECT LT  Result Date: 06/19/2020 CLINICAL DATA:  Large B-cell lymphoma. EXAM: FLUOROSCOPICALLY GUIDED LUMBAR PUNCTURE FOR INTRATHECAL CHEMOTHERAPY FLUOROSCOPY TIME:  0 minutes and 30 seconds. PROCEDURE: Informed consent was obtained from the patient prior to the procedure, including potential complications of headache, bleeding, infection and damage normal structures. With the patient prone when appropriate site was marked on the patient's skin at L4-5. The lower back was prepped with Betadine.  1% Lidocaine was used for local anesthesia. Lumbar puncture was performed at the L4-5 level using a 20 gauge spinal needle with return of clearCSF. 3 cc of CSF was removed for appropriate laboratory evaluation/cytology. 12 mg of methotrexate was injected into the subarachnoid space. The patient tolerated the procedure well without apparent complication. IMPRESSION: Fluoroscopic guided lumbar puncture and installation of intrathecal methotrexate. Electronically Signed   By: Marijo Sanes M.D.   On: 06/19/2020 14:06    ASSESSMENT AND  PLAN: 1.  Stage IVa diffuse large B-cell lymphoma, ABC subtype, s/p 3 cycle R-EPOCH with excellent response  2.  Fatigue secondary #1 3.  Anemia secondary to recent chemotherapy 4. HTN 5. Drug induced constipation   -He is clinically doing well, tolerated day 3 of cycle 4 of his chemotherapy well overall. -Labs from this morning have been reviewed and remained stable. Will continue day 4 chemo today - Continue daily CBC with differential and CMET. -s/p intrathecal methotrexate 07/10/2020 and tolerated well -Continue aspirin and lovenox  -We will add lactulose daily as needed for severe constipation -continue supportive care      LOS: 3 days   Mikey Bussing 07/12/20   Addendum  I have seen the patient, examined him. I agree with the assessment and and plan and have edited the notes.   Pt is doing well, lab reviewed, will continue chemo today. May give one unit blood if Hg<8.0 tomorrow.  I answered pt and her wife's questions.   John [name]  07/12/2020

## 2020-07-13 ENCOUNTER — Other Ambulatory Visit: Payer: Self-pay | Admitting: Hematology

## 2020-07-13 DIAGNOSIS — C8338 Diffuse large B-cell lymphoma, lymph nodes of multiple sites: Secondary | ICD-10-CM | POA: Diagnosis not present

## 2020-07-13 LAB — CBC WITH DIFFERENTIAL/PLATELET
Abs Immature Granulocytes: 0.02 10*3/uL (ref 0.00–0.07)
Basophils Absolute: 0 10*3/uL (ref 0.0–0.1)
Basophils Relative: 0 %
Eosinophils Absolute: 0 10*3/uL (ref 0.0–0.5)
Eosinophils Relative: 0 %
HCT: 25.8 % — ABNORMAL LOW (ref 39.0–52.0)
Hemoglobin: 8.6 g/dL — ABNORMAL LOW (ref 13.0–17.0)
Immature Granulocytes: 1 %
Lymphocytes Relative: 9 %
Lymphs Abs: 0.2 10*3/uL — ABNORMAL LOW (ref 0.7–4.0)
MCH: 30.2 pg (ref 26.0–34.0)
MCHC: 33.3 g/dL (ref 30.0–36.0)
MCV: 90.5 fL (ref 80.0–100.0)
Monocytes Absolute: 0.1 10*3/uL (ref 0.1–1.0)
Monocytes Relative: 3 %
Neutro Abs: 2.2 10*3/uL (ref 1.7–7.7)
Neutrophils Relative %: 87 %
Platelets: 117 10*3/uL — ABNORMAL LOW (ref 150–400)
RBC: 2.85 MIL/uL — ABNORMAL LOW (ref 4.22–5.81)
RDW: 15.9 % — ABNORMAL HIGH (ref 11.5–15.5)
WBC: 2.5 10*3/uL — ABNORMAL LOW (ref 4.0–10.5)
nRBC: 0 % (ref 0.0–0.2)

## 2020-07-13 LAB — COMPREHENSIVE METABOLIC PANEL
ALT: 27 U/L (ref 0–44)
AST: 26 U/L (ref 15–41)
Albumin: 3.1 g/dL — ABNORMAL LOW (ref 3.5–5.0)
Alkaline Phosphatase: 46 U/L (ref 38–126)
Anion gap: 9 (ref 5–15)
BUN: 26 mg/dL — ABNORMAL HIGH (ref 8–23)
CO2: 23 mmol/L (ref 22–32)
Calcium: 8.3 mg/dL — ABNORMAL LOW (ref 8.9–10.3)
Chloride: 109 mmol/L (ref 98–111)
Creatinine, Ser: 0.68 mg/dL (ref 0.61–1.24)
GFR, Estimated: >60 mL/min (ref >60)
Glucose, Bld: 79 mg/dL (ref 70–99)
Potassium: 3.4 mmol/L — ABNORMAL LOW (ref 3.5–5.1)
Sodium: 141 mmol/L (ref 135–145)
Total Bilirubin: 1.1 mg/dL (ref 0.3–1.2)
Total Protein: 5.1 g/dL — ABNORMAL LOW (ref 6.5–8.1)

## 2020-07-13 LAB — TYPE AND SCREEN
ABO/RH(D): A POS
Antibody Screen: NEGATIVE

## 2020-07-13 LAB — GLUCOSE, CAPILLARY: Glucose-Capillary: 72 mg/dL (ref 70–99)

## 2020-07-13 MED ORDER — POTASSIUM CHLORIDE CRYS ER 20 MEQ PO TBCR
20.0000 meq | EXTENDED_RELEASE_TABLET | Freq: Once | ORAL | Status: AC
  Administered 2020-07-13: 20 meq via ORAL
  Filled 2020-07-13: qty 1

## 2020-07-13 MED ORDER — HEPARIN SOD (PORK) LOCK FLUSH 100 UNIT/ML IV SOLN
500.0000 [IU] | INTRAVENOUS | Status: AC | PRN
  Administered 2020-07-13: 500 [IU]
  Filled 2020-07-13: qty 5

## 2020-07-13 NOTE — Discharge Instructions (Signed)
Chemotherapy Chemotherapy is a cancer treatment. It uses medicines to slow down or stop the growth of cancer. You may have chemotherapy to:  Cure your cancer.  Prevent the cancer from growing or spreading (metastasizing).  Ease symptoms and improve your quality of life (palliative care).  Improve the effects of radiation treatment.  Shrink a tumor before surgery.  Rid the body of cancer cells that remain after having a tumor surgically removed. The length of chemotherapy treatment depends on many factors, including:  The type and stage of your cancer.  How you respond to the chemotherapy.  Your side effects. What are the risks? Generally, this is a safe treatment. However, problems may occur, including:  Infection.  Bleeding at the IV site.  Allergic reactions to medicines. You may have side effects from chemotherapy. What side effects you have depends on a variety of factors, including:  The type of chemotherapy medicine used.  Your dosage.  How long the medicine is used for.  Your overall health. What happens before treatment?  You will meet with your cancer care team to discuss: ? How your chemotherapy medicine will be given. ? Common side effects and how to manage them. ? Your treatment schedule.  You may have blood tests.  You may be given medicine to help prevent common side effects. What happens during treatment? Chemotherapy may be given continuously over time, or it may be given in cycles. Some common ways chemotherapy may be given include:  As a pill or capsule.  As an injection.  As a skin (topical) cream.  As a special wafer that is put in your body where the cancer is.  As an injection into the cerebrospinal fluid (CSF) in the brain or spinal cord (intraventricular or intrathecal chemotherapy).  Through a small, thin tube (catheter). There are different kinds of catheters. You might have one that: ? Goes into a vein (intravenous  catheter). ? Connects to a device (port) that is inserted under the skin of your chest (port catheter). A port catheter connects the port to a large vein in your chest or upper arm. The port may stay in place for many weeks or months. ? Goes into a vein near your elbow (PICC line). This may be used for weeks or months. ? Goes into a vein in your neck that leads to your heart (non-tunneled catheter). This catheter has a risk of infection, so it is used for only a short time. ? Goes through the skin of your chest and into a large vein that leads to your heart (tunneled catheter). This catheter can stay in your body for months or years. While you are receiving your medicine, your cancer care team will monitor your blood pressure, heart rate, breathing rate, and blood oxygen level (vital signs) and watch for any problems. Some types of chemotherapy medicine are given only one time. Others are given for months, years, or for life.   What can I expect after treatment? After chemotherapy, you may have side effects, such as:  Nausea and vomiting.  Appetite loss.  Constipation or diarrhea.  Fatigue.  Increased risk of infections, bruising, or bleeding.  Hair loss.  Mouth or throat sores.  Tingling, pain, or numbness in the hands and feet.  Dry, sensitive, itchy, or sore skin.  Memory changes. Follow these instructions at home: General instructions  If you get chemotherapy through an IV, PICC line, or port, check the site every day for signs of infection. Check for redness, swelling,  pain, fluid, or warmth.  Wash your hands frequently with soap and water. If soap and water are not available, use hand sanitizer. Have other members of your household wash their hands often.  Chemotherapy medicines leave the body through urine or stool (feces), but they can also be present in other body fluids including vomit, tears, vaginal fluids, and semen. You must carefully follow some safety precautions  to prevent harm to others while you are taking these medicines: ? Wash any clothes, towels, and linens that may have your bodily fluids on them twice in a washing machine. ? Use a condom during vaginal, anal, and oral sex while you are taking chemotherapy medicines and for 48 hours after your last dose. ? Practice good bathroom hygiene:  Always sit when using the toilet. Close the toilet seat lid before you flush.  Wash your hands thoroughly with soap and water after each time you use the toilet.  Keep all follow-up visits as told by your cancer care team. This is important.   Eating and drinking  Talk with a dietitian about what you should eat and drink during cancer treatment.  Always wash fresh fruits and vegetables well before eating them.  Drink enough fluid to keep urine pale yellow. Medicines  Take over-the-counter and prescription medicines only as told by your health care provider.  Talk with your health care provider before taking vitamins and supplements. Some can interfere with chemotherapy. Activity  Get plenty of rest.  Get regular exercise such as walking, gentle yoga, or tai chi.  Return to your normal activities as told by your health care provider. Ask your health care provider what activities are safe for you. Contact a health care provider if:  You have: ? A skin rash that does not go away. ? A headache. ? A stiff neck. ? A cough. ? Cold or flu symptoms. ? A burning feeling when urinating. ? Urine that smells bad. ? Diarrhea. ? Nausea. ? Vomiting. ? Blood in your urine or stool.  You bleed or bruise often.  You urinate more frequently than usual.  You cannot eat because of mouth or throat pain. Get help right away if:  You have: ? A fever. ? Redness, swelling, pain, fluid, or warmth near your IV site. ? Bleeding that does not stop. ? A seizure. ? Chest pain. ? Difficulty breathing.  You cannot swallow. Summary  Chemotherapy is a way to  treat cancer. It uses medicines to slow down or stop the growth of cancer.  Before treatment, you and your cancer care team will discuss common side effects and how to manage them.  The way that you will get chemotherapy medicines depends on your condition.  Take over-the-counter and prescription medicines only as told by your health care provider. This information is not intended to replace advice given to you by your health care provider. Make sure you discuss any questions you have with your health care provider. Document Revised: 12/16/2019 Document Reviewed: 12/16/2019 Elsevier Patient Education  [place].

## 2020-07-13 NOTE — Discharge Summary (Addendum)
Discharge Summary  Patient ID: John [name] MRN: [id number] DOB/AGE: 02-27-1946 74 y.o.  Admit date: 07/09/2020 Discharge date: 07/13/2020  Discharge Diagnoses:  Active Problems:   Diffuse high grade B-cell lymphoma (HCC)   Discharged Condition: good  Discharge Labs:   CBC    Component Value Date/Time   WBC 2.5 (L) 07/13/2020 0618   RBC 2.85 (L) 07/13/2020 0618   HGB 8.6 (L) 07/13/2020 0618   HGB 9.0 (L) 07/05/2020 1407   HCT 25.8 (L) 07/13/2020 0618   PLT 117 (L) 07/13/2020 0618   PLT 114 (L) 07/05/2020 1407   MCV 90.5 07/13/2020 0618   MCH 30.2 07/13/2020 0618   MCHC 33.3 07/13/2020 0618   RDW 15.9 (H) 07/13/2020 0618   LYMPHSABS 0.2 (L) 07/13/2020 0618   MONOABS 0.1 07/13/2020 0618   EOSABS 0.0 07/13/2020 0618   BASOSABS 0.0 07/13/2020 0618   CMP Latest Ref Rng & Units 07/13/2020 07/12/2020 07/11/2020  Glucose 70 - 99 mg/dL 79 91 95  BUN 8 - 23 mg/dL 26(H) 28(H) 23  Creatinine 0.61 - 1.24 mg/dL 0.68 0.85 0.70  Sodium 135 - 145 mmol/L 141 139 141  Potassium 3.5 - 5.1 mmol/L 3.4(L) 3.6 3.6  Chloride 98 - 111 mmol/L 109 108 110  CO2 22 - 32 mmol/L 23 22 22   Calcium 8.9 - 10.3 mg/dL 8.3(L) 7.9(L) 8.0(L)  Total Protein 6.5 - 8.1 g/dL 5.1(L) 5.1(L) 5.3(L)  Total Bilirubin 0.3 - 1.2 mg/dL 1.1 0.7 0.7  Alkaline Phos 38 - 126 U/L 46 48 51  AST 15 - 41 U/L 26 26 22   ALT 0 - 44 U/L 27 25 21     Consults: None  Procedures: 07/10/2020- fluoroscopic guided lumbar puncture and instillation of intrathecal methotrexate  Disposition:  Discharge disposition: 01-Home or Self Care        Allergies as of 07/13/2020   Not on File     Medication List    TAKE these medications   allopurinol 100 MG tablet Commonly known as: ZYLOPRIM TAKE 1 TABLET(100 MG) BY MOUTH DAILY What changed: See the new instructions.   aspirin EC 81 MG tablet Take 81 mg by mouth daily.   BOOST MAX PROTEIN PO Take 237 mLs by mouth daily. Chocolate   docusate sodium 100 MG capsule Commonly known  as: COLACE Take 2 capsules (200 mg total) by mouth 2 (two) times daily as needed for mild constipation.   doxazosin 4 MG tablet Commonly known as: CARDURA Take 4 mg by mouth daily.   HYDROcodone-acetaminophen 5-325 MG tablet Commonly known as: NORCO/VICODIN Take 1 tablet by mouth every 6 (six) hours as needed for moderate pain.   lidocaine-prilocaine cream Commonly known as: EMLA Apply 1 application topically as needed. What changed:   when to take this  reasons to take this   magic mouthwash w/lidocaine Soln Take 15 mLs by mouth 4 (four) times daily as needed for mouth pain.   melatonin 3 MG Tabs tablet Take 2 tablets (6 mg total) by mouth at bedtime as needed (sleep). What changed: when to take this   metoprolol tartrate 25 MG tablet Commonly known as: LOPRESSOR Take 0.5 tablets (12.5 mg total) by mouth 2 (two) times daily.   MULTIVITAMIN PO Take 1 tablet by mouth daily.   OMEGA 3 PO Take 1 tablet by mouth daily.   ondansetron 8 MG tablet Commonly known as: ZOFRAN Take 1 tablet (8 mg total) by mouth every 8 (eight) hours as needed for nausea or vomiting.  pantoprazole 40 MG tablet Commonly known as: Protonix Take 1 tablet (40 mg total) by mouth daily.   polyethylene glycol 17 g packet Commonly known as: MIRALAX / GLYCOLAX Take 17 g by mouth daily as needed for mild constipation.   prochlorperazine 10 MG tablet Commonly known as: COMPAZINE Take 1 tablet (10 mg total) by mouth every 6 (six) hours as needed for nausea or vomiting.   senna-docusate 8.6-50 MG tablet Commonly known as: Senokot-S Take 2 tablets by mouth 2 (two) times daily as needed for mild constipation.   simethicone 80 MG chewable tablet Commonly known as: MYLICON Chew 1 tablet (80 mg total) by mouth 4 (four) times daily as needed for flatulence.   sodium bicarbonate/sodium chloride Soln 1 application by Mouth Rinse route as needed for dry mouth or mouth pain. What changed: when to take  this   vitamin C with rose hips 1000 MG tablet Take 1,000 mg by mouth daily.        HPI:  John [name] a 75year old Caucasian male, with past medical history of hypertension, BPH.  He was admitted to [facility] in November 2021 due to fatigue and constipation.  He was found to have severe hypercalcemia and CT scan showed a large mediastinal and liver mass with diffuse retroperitoneal lymphadenopathy.  He received IV fluids and pamidronate for treatment of hypercalcemia with resolution.  He also had hyperuricemia and was treated with rasburicase and then placed on allopurinol.  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  The patient was discharged home with a course of prednisone x5 days. Once biopsy resulted, it was recommended him to begin inpatient EPOCH-R for treatment of his lymphoma.    The patient was seen on the morning of admission and was clinically stable.  He was not having any issues with pain, nausea, fever, chills, bleeding, or other discomfort.  Constipation was well controlled with medication.  He had mild fatigue but able to function well at home.  Denied peripheral neuropathy. He was seen on the morning of admission for second before of EPOCH-R.  Hospital Course:  John [name] was admitted on 07/09/2020 and chemotherapy was started as planned on day 1.  The patient overall tolerated his chemotherapy well this admission.  He had no significant side effects such as mucositis, nausea, vomiting.  He had no difficulty with constipation this admission.  The patient received intrathecal methotrexate on 07/10/2020 and tolerated the procedure well.  Fluid was sent for cytology and was negative for malignancy.   The patient was seen on the morning of 06/22/2020 and was feeling well overall.  His exam remained unchanged.  He had mild hypokalemia and was given a dose of K-Dur 20 mEq prior to  discharge.  The patient will return to the cancer center on 07/16/2020 for Rituxan and Neulasta.  Lab appointment is scheduled for 07/19/2020 and labs with follow-up visit are scheduled for 07/26/2020.   Discharge Instructions    Activity as tolerated - No restrictions   Complete by: As directed    Diet general   Complete by: As directed       Signed: Mikey Bussing 07/13/2020, 9:36 AM   Addendum  I have seen the patient, examined him. I agree with the assessment and and plan and have edited the notes.   All outpt follow up and treatment appointments are made before discharge and pt is aware.  Truitt Merle  07/13/2020

## 2020-07-16 ENCOUNTER — Other Ambulatory Visit: Payer: Self-pay

## 2020-07-16 ENCOUNTER — Inpatient Hospital Stay: Payer: Medicare HMO | Attending: Hematology

## 2020-07-16 ENCOUNTER — Other Ambulatory Visit: Payer: Self-pay | Admitting: Adult Health

## 2020-07-16 VITALS — BP 113/59 | HR 90 | Temp 98.7°F | Resp 18

## 2020-07-16 DIAGNOSIS — C8330 Diffuse large B-cell lymphoma, unspecified site: Secondary | ICD-10-CM

## 2020-07-16 DIAGNOSIS — C8339 Diffuse large B-cell lymphoma, extranodal and solid organ sites: Secondary | ICD-10-CM | POA: Insufficient documentation

## 2020-07-16 DIAGNOSIS — Z5112 Encounter for antineoplastic immunotherapy: Secondary | ICD-10-CM | POA: Diagnosis present

## 2020-07-16 DIAGNOSIS — N179 Acute kidney failure, unspecified: Secondary | ICD-10-CM | POA: Insufficient documentation

## 2020-07-16 DIAGNOSIS — R634 Abnormal weight loss: Secondary | ICD-10-CM | POA: Diagnosis not present

## 2020-07-16 DIAGNOSIS — Z298 Encounter for other specified prophylactic measures: Secondary | ICD-10-CM | POA: Diagnosis present

## 2020-07-16 DIAGNOSIS — Z9221 Personal history of antineoplastic chemotherapy: Secondary | ICD-10-CM | POA: Insufficient documentation

## 2020-07-16 DIAGNOSIS — Z5189 Encounter for other specified aftercare: Secondary | ICD-10-CM | POA: Insufficient documentation

## 2020-07-16 DIAGNOSIS — R5383 Other fatigue: Secondary | ICD-10-CM | POA: Insufficient documentation

## 2020-07-16 DIAGNOSIS — D61810 Antineoplastic chemotherapy induced pancytopenia: Secondary | ICD-10-CM | POA: Insufficient documentation

## 2020-07-16 DIAGNOSIS — D649 Anemia, unspecified: Secondary | ICD-10-CM | POA: Insufficient documentation

## 2020-07-16 MED ORDER — SODIUM CHLORIDE 0.9% FLUSH
10.0000 mL | INTRAVENOUS | Status: DC | PRN
  Administered 2020-07-16: 10 mL
  Filled 2020-07-16: qty 10

## 2020-07-16 MED ORDER — PEGFILGRASTIM-CBQV 6 MG/0.6ML ~~LOC~~ SOSY
PREFILLED_SYRINGE | SUBCUTANEOUS | Status: AC
  Filled 2020-07-16: qty 0.6

## 2020-07-16 MED ORDER — DIPHENHYDRAMINE HCL 25 MG PO CAPS
ORAL_CAPSULE | ORAL | Status: AC
  Filled 2020-07-16: qty 2

## 2020-07-16 MED ORDER — SODIUM CHLORIDE 0.9 % IV SOLN
375.0000 mg/m2 | Freq: Once | INTRAVENOUS | Status: AC
  Administered 2020-07-16: 800 mg via INTRAVENOUS
  Filled 2020-07-16: qty 50

## 2020-07-16 MED ORDER — ACETAMINOPHEN 325 MG PO TABS
ORAL_TABLET | ORAL | Status: AC
  Filled 2020-07-16: qty 2

## 2020-07-16 MED ORDER — DIPHENHYDRAMINE HCL 25 MG PO CAPS
50.0000 mg | ORAL_CAPSULE | Freq: Once | ORAL | Status: AC
Start: 2020-07-16 — End: 2020-07-16
  Administered 2020-07-16: 50 mg via ORAL

## 2020-07-16 MED ORDER — SODIUM CHLORIDE 0.9 % IV SOLN
INTRAVENOUS | Status: DC
  Filled 2020-07-16 (×2): qty 250

## 2020-07-16 MED ORDER — HEPARIN SOD (PORK) LOCK FLUSH 100 UNIT/ML IV SOLN
500.0000 [IU] | Freq: Once | INTRAVENOUS | Status: AC | PRN
  Administered 2020-07-16: 500 [IU]
  Filled 2020-07-16: qty 5

## 2020-07-16 MED ORDER — ACETAMINOPHEN 325 MG PO TABS
650.0000 mg | ORAL_TABLET | Freq: Once | ORAL | Status: AC
  Administered 2020-07-16: 650 mg via ORAL

## 2020-07-16 MED ORDER — PEGFILGRASTIM-CBQV 6 MG/0.6ML ~~LOC~~ SOSY
6.0000 mg | PREFILLED_SYRINGE | Freq: Once | SUBCUTANEOUS | Status: AC
  Administered 2020-07-16: 6 mg via SUBCUTANEOUS

## 2020-07-16 NOTE — Progress Notes (Signed)
I connected by phone with Andree Moro on 07/16/2020, 5:31 PM to discuss the potential use of a new treatment for mild to moderate COVID-19 viral infection in non-hospitalized patients.  This patient is a 75 y.o. male that meets the FDA criteria for Emergency Use Authorization of tixagevimab/cilgavimab for pre-exposure prophylaxis of COVID-19 disease. Pt meets following criteria:  Age >12 yr and weight > 40kg  Not currently infected with SARS-CoV-2 and has no known recent exposure to an individual infected with SARS-CoV-2 AND o Who has moderate to severe immune compromise due to a medical condition or receipt of immunosuppressive medications or treatments and may not mount an adequate immune response to COVID-19 vaccination or  o Vaccination with any available COVID-19 vaccine, according to the approved or authorized schedule, is not recommended due to a history of severe adverse reaction (e.g., severe allergic reaction) to a COVID-19 vaccine(s) and/or COVID-19 vaccine component(s).  o Patient meets the following definition of mod-severe immune compromised status: 6. Other actively treated hematologic malignancies or severe congenital immunodeficiency syndromes  I have spoken and communicated the following to the patient or parent/caregiver regarding COVID monoclonal antibody treatment:  1. FDA has authorized the emergency use of tixagevimab/cilgavimab for the pre-exposure prophylaxis of COVID-19 in patients with moderate-severe immunocompromised status, who meet above EUA criteria.  2. The significant known and potential risks and benefits of COVID monoclonal antibody, and the extent to which such potential risks and benefits are unknown.  3. Information on available alternative treatments and the risks and benefits of those alternatives, including clinical trials.  4. The patient or parent/caregiver has the option to accept or refuse COVID monoclonal antibody treatment.  After reviewing this  information with the patient, agree to receive tixagevimab/cilgavimab  Scot Dock, NP, 07/16/2020, 5:31 PM

## 2020-07-19 ENCOUNTER — Other Ambulatory Visit: Payer: Self-pay

## 2020-07-19 ENCOUNTER — Inpatient Hospital Stay: Payer: Medicare HMO

## 2020-07-19 ENCOUNTER — Other Ambulatory Visit: Payer: Medicare HMO

## 2020-07-19 DIAGNOSIS — Z95828 Presence of other vascular implants and grafts: Secondary | ICD-10-CM

## 2020-07-19 DIAGNOSIS — D649 Anemia, unspecified: Secondary | ICD-10-CM

## 2020-07-19 DIAGNOSIS — C8330 Diffuse large B-cell lymphoma, unspecified site: Secondary | ICD-10-CM

## 2020-07-19 DIAGNOSIS — Z5112 Encounter for antineoplastic immunotherapy: Secondary | ICD-10-CM | POA: Diagnosis not present

## 2020-07-19 DIAGNOSIS — C8339 Diffuse large B-cell lymphoma, extranodal and solid organ sites: Secondary | ICD-10-CM

## 2020-07-19 LAB — CBC WITH DIFFERENTIAL (CANCER CENTER ONLY)
Abs Immature Granulocytes: 0 10*3/uL (ref 0.00–0.07)
Band Neutrophils: 1 %
Basophils Absolute: 0 10*3/uL (ref 0.0–0.1)
Basophils Relative: 0 %
Eosinophils Absolute: 0.1 10*3/uL (ref 0.0–0.5)
Eosinophils Relative: 5 %
HCT: 22.8 % — ABNORMAL LOW (ref 39.0–52.0)
Hemoglobin: 7.7 g/dL — ABNORMAL LOW (ref 13.0–17.0)
Lymphocytes Relative: 3 %
Lymphs Abs: 0.1 10*3/uL — ABNORMAL LOW (ref 0.7–4.0)
MCH: 30.3 pg (ref 26.0–34.0)
MCHC: 33.8 g/dL (ref 30.0–36.0)
MCV: 89.8 fL (ref 80.0–100.0)
Metamyelocytes Relative: 2 %
Monocytes Absolute: 0.1 10*3/uL (ref 0.1–1.0)
Monocytes Relative: 4 %
Neutro Abs: 2.1 10*3/uL (ref 1.7–7.7)
Neutrophils Relative %: 85 %
Platelet Count: 58 10*3/uL — ABNORMAL LOW (ref 150–400)
RBC: 2.54 MIL/uL — ABNORMAL LOW (ref 4.22–5.81)
RDW: 15.6 % — ABNORMAL HIGH (ref 11.5–15.5)
WBC Count: 2.4 10*3/uL — ABNORMAL LOW (ref 4.0–10.5)
nRBC: 0 % (ref 0.0–0.2)

## 2020-07-19 LAB — CMP (CANCER CENTER ONLY)
ALT: 17 U/L (ref 0–44)
AST: 13 U/L — ABNORMAL LOW (ref 15–41)
Albumin: 3.2 g/dL — ABNORMAL LOW (ref 3.5–5.0)
Alkaline Phosphatase: 65 U/L (ref 38–126)
Anion gap: 7 (ref 5–15)
BUN: 16 mg/dL (ref 8–23)
CO2: 26 mmol/L (ref 22–32)
Calcium: 8.8 mg/dL — ABNORMAL LOW (ref 8.9–10.3)
Chloride: 105 mmol/L (ref 98–111)
Creatinine: 0.75 mg/dL (ref 0.61–1.24)
GFR, Estimated: >60 mL/min (ref >60)
Glucose, Bld: 138 mg/dL — ABNORMAL HIGH (ref 70–99)
Potassium: 3.8 mmol/L (ref 3.5–5.1)
Sodium: 138 mmol/L (ref 135–145)
Total Bilirubin: 0.5 mg/dL (ref 0.3–1.2)
Total Protein: 5.4 g/dL — ABNORMAL LOW (ref 6.5–8.1)

## 2020-07-19 LAB — LACTATE DEHYDROGENASE: LDH: 256 U/L — ABNORMAL HIGH (ref 98–192)

## 2020-07-19 LAB — URIC ACID: Uric Acid, Serum: 5.4 mg/dL (ref 3.7–8.6)

## 2020-07-19 MED ORDER — SODIUM CHLORIDE 0.9% IV SOLUTION
250.0000 mL | Freq: Once | INTRAVENOUS | Status: DC
  Filled 2020-07-19: qty 250

## 2020-07-19 MED ORDER — SODIUM CHLORIDE 0.9% FLUSH
10.0000 mL | INTRAVENOUS | Status: DC | PRN
  Administered 2020-07-19: 10 mL via INTRAVENOUS
  Filled 2020-07-19: qty 10

## 2020-07-19 MED ORDER — HEPARIN SOD (PORK) LOCK FLUSH 100 UNIT/ML IV SOLN
500.0000 [IU] | Freq: Once | INTRAVENOUS | Status: AC
  Administered 2020-07-19: 500 [IU] via INTRAVENOUS
  Filled 2020-07-19: qty 5

## 2020-07-19 MED ORDER — CILGAVIMAB (PART OF EVUSHELD) INJECTION
150.0000 mg | Freq: Once | INTRAMUSCULAR | Status: AC
  Administered 2020-07-19: 150 mg via INTRAMUSCULAR
  Filled 2020-07-19: qty 1.5

## 2020-07-19 MED ORDER — TIXAGEVIMAB (PART OF EVUSHELD) INJECTION
150.0000 mg | Freq: Once | INTRAMUSCULAR | Status: AC
  Administered 2020-07-19: 150 mg via INTRAMUSCULAR
  Filled 2020-07-19: qty 1.5

## 2020-07-19 NOTE — Progress Notes (Signed)
Patient discharged in stable condition following 1-hour observation period after evusheld injection. Patient provided with information. All questions answered.

## 2020-07-19 NOTE — Patient Instructions (Signed)
Implanted Port Insertion, Care After This sheet gives you information about how to care for yourself after your procedure. Your health care provider may also give you more specific instructions. If you have problems or questions, contact your health care provider. What can I expect after the procedure? After the procedure, it is common to have:  Discomfort at the port insertion site.  Bruising on the skin over the port. This should improve over 3-4 days. Follow these instructions at home: Port care  After your port is placed, you will get a manufacturer's information card. The card has information about your port. Keep this card with you at all times.  Take care of the port as told by your health care provider. Ask your health care provider if you or a family member can get training for taking care of the port at home. A home health care nurse may also take care of the port.  Make sure to remember what type of port you have. Incision care  Follow instructions from your health care provider about how to take care of your port insertion site. Make sure you: ? Wash your hands with soap and water before and after you change your bandage (dressing). If soap and water are not available, use hand sanitizer. ? Change your dressing as told by your health care provider. ? Leave stitches (sutures), skin glue, or adhesive strips in place. These skin closures may need to stay in place for 2 weeks or longer. If adhesive strip edges start to loosen and curl up, you may trim the loose edges. Do not remove adhesive strips completely unless your health care provider tells you to do that.  Check your port insertion site every day for signs of infection. Check for: ? Redness, swelling, or pain. ? Fluid or blood. ? Warmth. ? Pus or a bad smell.      Activity  Return to your normal activities as told by your health care provider. Ask your health care provider what activities are safe for you.  Do not  lift anything that is heavier than 10 lb (4.5 kg), or the limit that you are told, until your health care provider says that it is safe. General instructions  Take over-the-counter and prescription medicines only as told by your health care provider.  Do not take baths, swim, or use a hot tub until your health care provider approves. Ask your health care provider if you may take showers. You may only be allowed to take sponge baths.  Do not drive for 24 hours if you were given a sedative during your procedure.  Wear a medical alert bracelet in case of an emergency. This will tell any health care providers that you have a port.  Keep all follow-up visits as told by your health care provider. This is important. Contact a health care provider if:  You cannot flush your port with saline as directed, or you cannot draw blood from the port.  You have a fever or chills.  You have redness, swelling, or pain around your port insertion site.  You have fluid or blood coming from your port insertion site.  Your port insertion site feels warm to the touch.  You have pus or a bad smell coming from the port insertion site. Get help right away if:  You have chest pain or shortness of breath.  You have bleeding from your port that you cannot control. Summary  Take care of the port as told by your   health care provider. Keep the manufacturer's information card with you at all times.  Change your dressing as told by your health care provider.  Contact a health care provider if you have a fever or chills or if you have redness, swelling, or pain around your port insertion site.  Keep all follow-up visits as told by your health care provider. This information is not intended to replace advice given to you by your health care provider. Make sure you discuss any questions you have with your health care provider. Document Revised: 12/22/2017 Document Reviewed: 12/22/2017 [employer]vier Patient Education   2021 [employer]  

## 2020-07-20 ENCOUNTER — Other Ambulatory Visit: Payer: Self-pay | Admitting: Hematology

## 2020-07-20 DIAGNOSIS — D649 Anemia, unspecified: Secondary | ICD-10-CM

## 2020-07-23 ENCOUNTER — Other Ambulatory Visit: Payer: Self-pay

## 2020-07-23 ENCOUNTER — Inpatient Hospital Stay: Payer: Medicare HMO

## 2020-07-23 DIAGNOSIS — D649 Anemia, unspecified: Secondary | ICD-10-CM

## 2020-07-23 DIAGNOSIS — Z5112 Encounter for antineoplastic immunotherapy: Secondary | ICD-10-CM | POA: Diagnosis not present

## 2020-07-23 DIAGNOSIS — C8339 Diffuse large B-cell lymphoma, extranodal and solid organ sites: Secondary | ICD-10-CM

## 2020-07-23 LAB — CBC WITH DIFFERENTIAL (CANCER CENTER ONLY)
Abs Immature Granulocytes: 0.6 10*3/uL — ABNORMAL HIGH (ref 0.00–0.07)
Band Neutrophils: 14 %
Basophils Absolute: 0.1 10*3/uL (ref 0.0–0.1)
Basophils Relative: 2 %
Eosinophils Absolute: 0.2 10*3/uL (ref 0.0–0.5)
Eosinophils Relative: 3 %
HCT: 26.1 % — ABNORMAL LOW (ref 39.0–52.0)
Hemoglobin: 8.4 g/dL — ABNORMAL LOW (ref 13.0–17.0)
Lymphocytes Relative: 6 %
Lymphs Abs: 0.4 10*3/uL — ABNORMAL LOW (ref 0.7–4.0)
MCH: 30.1 pg (ref 26.0–34.0)
MCHC: 32.2 g/dL (ref 30.0–36.0)
MCV: 93.5 fL (ref 80.0–100.0)
Metamyelocytes Relative: 5 %
Monocytes Absolute: 0.5 10*3/uL (ref 0.1–1.0)
Monocytes Relative: 7 %
Myelocytes: 3 %
Neutro Abs: 5 10*3/uL (ref 1.7–7.7)
Neutrophils Relative %: 59 %
Platelet Count: 96 10*3/uL — ABNORMAL LOW (ref 150–400)
Promyelocytes Relative: 1 %
RBC: 2.79 MIL/uL — ABNORMAL LOW (ref 4.22–5.81)
RDW: 17.2 % — ABNORMAL HIGH (ref 11.5–15.5)
WBC Count: 6.9 10*3/uL (ref 4.0–10.5)
nRBC: 1.9 % — ABNORMAL HIGH (ref 0.0–0.2)

## 2020-07-23 LAB — CMP (CANCER CENTER ONLY)
ALT: 23 U/L (ref 0–44)
AST: 18 U/L (ref 15–41)
Albumin: 3.2 g/dL — ABNORMAL LOW (ref 3.5–5.0)
Alkaline Phosphatase: 73 U/L (ref 38–126)
Anion gap: 8 (ref 5–15)
BUN: 13 mg/dL (ref 8–23)
CO2: 24 mmol/L (ref 22–32)
Calcium: 8.6 mg/dL — ABNORMAL LOW (ref 8.9–10.3)
Chloride: 107 mmol/L (ref 98–111)
Creatinine: 0.87 mg/dL (ref 0.61–1.24)
GFR, Estimated: >60 mL/min (ref >60)
Glucose, Bld: 161 mg/dL — ABNORMAL HIGH (ref 70–99)
Potassium: 3.8 mmol/L (ref 3.5–5.1)
Sodium: 139 mmol/L (ref 135–145)
Total Bilirubin: 0.4 mg/dL (ref 0.3–1.2)
Total Protein: 5.6 g/dL — ABNORMAL LOW (ref 6.5–8.1)

## 2020-07-23 LAB — SAMPLE TO BLOOD BANK

## 2020-07-23 LAB — PREPARE RBC (CROSSMATCH)

## 2020-07-23 MED ORDER — HEPARIN SOD (PORK) LOCK FLUSH 100 UNIT/ML IV SOLN
500.0000 [IU] | Freq: Every day | INTRAVENOUS | Status: AC | PRN
  Administered 2020-07-23: 500 [IU]
  Filled 2020-07-23: qty 5

## 2020-07-23 MED ORDER — SODIUM CHLORIDE 0.9% FLUSH
10.0000 mL | INTRAVENOUS | Status: AC | PRN
  Administered 2020-07-23: 10 mL
  Filled 2020-07-23: qty 10

## 2020-07-23 MED ORDER — DIPHENHYDRAMINE HCL 25 MG PO CAPS
25.0000 mg | ORAL_CAPSULE | Freq: Once | ORAL | Status: AC
  Administered 2020-07-23: 25 mg via ORAL

## 2020-07-23 MED ORDER — ACETAMINOPHEN 325 MG PO TABS
ORAL_TABLET | ORAL | Status: AC
  Filled 2020-07-23: qty 2

## 2020-07-23 MED ORDER — DIPHENHYDRAMINE HCL 25 MG PO CAPS
ORAL_CAPSULE | ORAL | Status: AC
  Filled 2020-07-23: qty 1

## 2020-07-23 MED ORDER — ACETAMINOPHEN 325 MG PO TABS
650.0000 mg | ORAL_TABLET | Freq: Once | ORAL | Status: AC
  Administered 2020-07-23: 650 mg via ORAL

## 2020-07-23 MED ORDER — SODIUM CHLORIDE 0.9% IV SOLUTION
250.0000 mL | Freq: Once | INTRAVENOUS | Status: AC
  Administered 2020-07-23: 250 mL via INTRAVENOUS
  Filled 2020-07-23: qty 250

## 2020-07-23 NOTE — Patient Instructions (Signed)

## 2020-07-24 LAB — TYPE AND SCREEN
ABO/RH(D): A POS
Antibody Screen: NEGATIVE
Unit division: 0

## 2020-07-24 LAB — BPAM RBC
Blood Product Expiration Date: 202203062359
ISSUE DATE / TIME: 202202141135
Unit Type and Rh: 6200

## 2020-07-25 NOTE — Progress Notes (Signed)
[facility]   [phone] Fax:([phone]) [phone]   Clinic Follow up Note   Patient Care Team: Christain Sacramento, MD as PCP - General (Family Medicine)  Date of Service:  07/26/2020  CHIEF COMPLAINT: F/u ofDiffuse large B cell lymphoma  SUMMARY OF ONCOLOGIC HISTORY: Oncology History Overview Note  Cancer Staging Diffuse large B cell lymphoma ([place]) Staging form: Hodgkin and Non-Hodgkin Lymphoma, AJCC 8th Edition - Clinical stage from 04/23/2020: Stage IV (Diffuse large B-cell lymphoma) - Signed by Truitt Merle, MD on 04/29/2020    Diffuse large B cell lymphoma ([place])  04/20/2020 Imaging   CT CAP  IMPRESSION: 1. Large anterior mediastinal/prevascular space mass/adenopathy. The mass extends superiorly and abuts the upper trachea. 2. Large hypodense lesion in the right lobe of the liver as well as ill-defined splenic hypodense lesions. Further characterization with MRI without and with contrast recommended. The liver lesion is amenable to percutaneous tissue sampling. 3. Extensive mesenteric and retroperitoneal adenopathy. 4. Sigmoid diverticulosis. No bowel obstruction. Normal appendix. 5. Aortic Atherosclerosis (ICD10-I70.0).   04/23/2020 Cancer Staging   Staging form: Hodgkin and Non-Hodgkin Lymphoma, AJCC 8th Edition - Clinical stage from 04/23/2020: Stage IV (Diffuse large B-cell lymphoma) - Signed by Truitt Merle, MD on 04/29/2020   04/23/2020 Initial Biopsy   FINAL MICROSCOPIC DIAGNOSIS:   A. LIVER, RIGHT MASS, NEEDLE CORE BIOPSY:  - Large B-cell lymphoma  - See comment     COMMENT:   The sections show needle core biopsy fragments displaying effacement of  the architecture by a dense infiltrate of primarily large atypical  lymphoid appearing cells displaying vesicular chromatin and small  nucleoli.  This is associated with apoptosis and brisk mitosis.  The  appearance is diffuse with lack of atypical follicles.  A large battery  of  immunohistochemical stains was performed and shows that the atypical  lymphoid cells are positive for LCA, CD20, PAX 5, BCL-2, BCL6, CD5,  MUM-1 (partial).  The atypical lymphoid cells are negative for CD10,  CD30, CD34, CD138, cyclin D1, TdT, and EBV in situ hybridization,  synaptophysin, cytokeratin AE1/AE3, cytokeratin 20, cytokeratin 7,  cytokeratin 5/6, TTF-1, CD56, CDX2, and p40.  There is an admixed  relatively minor population of T-cells as primarily seen with CD3.  The  overall features are consistent with large B-cell lymphoma, ABC subtype.  The lymphomatous process displays expression of CD5.  This phenotype is  seen in 5-10% of mostly de novo large B-cell lymphoma cases or rarely as  a transformation of a low-grade B-cell lymphoma such as small  lymphocytic lymphoma.  Clinical correlation is recommended.    04/25/2020 Imaging   CT AP  IMPRESSION: 1. Development of high-density ascites in the abdomen and pelvis. Findings are most compatible with a small to moderate amount of hemoperitoneum and related to the recent liver biopsy. 2. Extensive lymphadenopathy throughout the abdomen and pelvis with a large mass or nodal mass near the pancreatic head. There are additional lesions involving the liver and spleen. Findings are suggestive for metastatic disease. 3. Development of small bilateral pleural effusions.   These results were called by telephone at the time of interpretation on 04/25/2020 at 1:43 pm to provider ERIC [place] who verbally acknowledged these results.   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 Marked reduction  in volume of anterior mediastinal mass now with low metabolic activity ( [place] 2). 2. Reduction in volume of retroperitoneal nodal mass  adjacent the pancreas with low metabolic activity ( [place] 3). 3. No increased metabolic activity associated with the spleen 4. Interval contraction of hepatic hematoma post biopsy. No hypermetabolic elements within the hepatic lesion. 5. Grade 2 anterolisthesis L4 on L5.        CURRENT THERAPY:  Inpatient R-EPOCHq3weeksfor 6 cycles starting 05/01/20 with intrathecal prophylactic treatmentstarting with C2  INTERVAL HISTORY:  [name] is here for a follow up. She presents to the clinic with his wife. He had some fatigue from anemia last week, but recovered well. He denies any pain, fever, muscle, cough or GI symptoms.  Bowel movement is normal.   All other systems were reviewed with the patient and are negative.  MEDICAL HISTORY:  Past Medical History:  Diagnosis Date  . Cancer ([place])   . Goals of care, counseling/discussion 05/06/2020  . Hypertension   . Knee pain, right     SURGICAL HISTORY: Past Surgical History:  Procedure Laterality Date  . FOOT SURGERY    . IR IMAGING GUIDED PORT INSERTION  05/28/2020  . TONSILLECTOMY      I have reviewed the social history and family history with the patient and they are unchanged from previous note.  ALLERGIES:  has No Known Allergies.  MEDICATIONS:  Current Outpatient Medications  Medication Sig Dispense Refill  . allopurinol (ZYLOPRIM) 100 MG tablet TAKE 1 TABLET(100 MG) BY MOUTH DAILY (Patient taking differently: Take 100 mg by mouth daily.) 90 tablet 1  . Ascorbic Acid (VITAMIN C WITH ROSE HIPS) 1000 MG tablet Take 1,000 mg by mouth daily.    Marland Kitchen aspirin EC 81 MG tablet Take 81 mg by mouth daily.    Marland Kitchen docusate sodium (COLACE) 100 MG capsule Take 2 capsules (200 mg total) by mouth 2 (two) times daily as needed for mild constipation. (Patient not taking: No sig reported) 10 capsule 0  . doxazosin (CARDURA) 4 MG tablet Take 4 mg by mouth daily.    Marland Kitchen HYDROcodone-acetaminophen (NORCO/VICODIN) 5-325 MG tablet Take 1  tablet by mouth every 6 (six) hours as needed for moderate pain. (Patient not taking: No sig reported) 30 tablet 0  . lidocaine-prilocaine (EMLA) cream Apply 1 application topically as needed. (Patient taking differently: Apply 1 application topically daily as needed (pain).) 30 g 0  . magic mouthwash w/lidocaine SOLN Take 15 mLs by mouth 4 (four) times daily as needed for mouth pain. (Patient not taking: No sig reported) 400 mL 0  . melatonin 3 MG TABS tablet Take 2 tablets (6 mg total) by mouth at bedtime as needed (sleep). (Patient taking differently: Take 6 mg by mouth at bedtime.) 30 tablet 0  . metoprolol tartrate (LOPRESSOR) 25 MG tablet Take 0.5 tablets (12.5 mg total) by mouth 2 (two) times daily. 30 tablet 0  . Multiple Vitamins-Minerals (MULTIVITAMIN PO) Take 1 tablet by mouth daily.    . Nutritional Supplements (BOOST MAX PROTEIN PO) Take 237 mLs by mouth daily. Chocolate    . Omega-3 Fatty Acids (OMEGA 3 PO) Take 1 tablet by mouth daily.    . ondansetron (ZOFRAN) 8 MG tablet Take 1 tablet (8 mg total) by mouth every 8 (eight) hours as needed for nausea or vomiting. 20 tablet 0  . pantoprazole (PROTONIX) 40 MG tablet Take 1 tablet (40 mg total) by mouth daily. 30 tablet 3  . polyethylene glycol (MIRALAX /  GLYCOLAX) 17 g packet Take 17 g by mouth daily as needed for mild constipation.    . prochlorperazine (COMPAZINE) 10 MG tablet Take 1 tablet (10 mg total) by mouth every 6 (six) hours as needed for nausea or vomiting. 30 tablet 0  . senna-docusate (SENOKOT-S) 8.6-50 MG tablet Take 2 tablets by mouth 2 (two) times daily as needed for mild constipation.    . simethicone (MYLICON) 80 MG chewable tablet Chew 1 tablet (80 mg total) by mouth 4 (four) times daily as needed for flatulence. 30 tablet 0  . Sodium Chloride-Sodium Bicarb (SODIUM BICARBONATE/SODIUM CHLORIDE) SOLN 1 application by Mouth Rinse route as needed for dry mouth or mouth pain. (Patient taking differently: 1 application by  Mouth Rinse route 4 (four) times daily as needed for dry mouth or mouth pain.)     No current facility-administered medications for this visit.    PHYSICAL EXAMINATION: ECOG PERFORMANCE STATUS: 1 - Symptomatic but completely ambulatory  Vitals:   07/26/20 1300  BP: 135/68  Pulse: 91  Resp: 18  Temp: 98.1 F (36.7 C)  SpO2: 97%   Filed Weights   07/26/20 1300  Weight: 210 lb (95.3 kg)    GENERAL:alert, no distress and comfortable SKIN: skin color, texture, turgor are normal, no rashes or significant lesions EYES: normal, Conjunctiva are pink and non-injected, sclera clear  Musculoskeletal:no cyanosis of digits and no clubbing  NEURO: alert & oriented x 3 with fluent speech, no focal motor/sensory deficits  LABORATORY DATA:  I have reviewed the data as listed CBC Latest Ref Rng & Units 07/26/2020 07/23/2020 07/19/2020  WBC 4.0 - 10.5 K/uL 7.7 6.9 2.4(L)  Hemoglobin 13.0 - 17.0 g/dL 9.6(L) 8.4(L) 7.7(L)  Hematocrit 39.0 - 52.0 % 28.8(L) 26.1(L) 22.8(L)  Platelets 150 - 400 K/uL 106(L) 96(L) 58(L)     CMP Latest Ref Rng & Units 07/26/2020 07/23/2020 07/19/2020  Glucose 70 - 99 mg/dL 127(H) 161(H) 138(H)  BUN 8 - 23 mg/dL 12 13 16   Creatinine 0.61 - 1.24 mg/dL 0.86 0.87 0.75  Sodium 135 - 145 mmol/L 140 139 138  Potassium 3.5 - 5.1 mmol/L 3.9 3.8 3.8  Chloride 98 - 111 mmol/L 108 107 105  CO2 22 - 32 mmol/L 25 24 26   Calcium 8.9 - 10.3 mg/dL 8.3(L) 8.6(L) 8.8(L)  Total Protein 6.5 - 8.1 g/dL 5.7(L) 5.6(L) 5.4(L)  Total Bilirubin 0.3 - 1.2 mg/dL 0.4 0.4 0.5  Alkaline Phos 38 - 126 U/L 77 73 65  AST 15 - 41 U/L 18 18 13(L)  ALT 0 - 44 U/L 16 23 17       RADIOGRAPHIC STUDIES: I have personally reviewed the radiological images as listed and agreed with the findings in the report. No results found.   ASSESSMENT & PLAN:  John [name] is a 75 y.o. male with   1.Diffuse large B cell lymphoma, ABC Subtype, stage IV  -After worsening fatigue pt presented to ED. 04/20/20  CT CAP showedlargemediastinal mass and diffuseliver masses, and diffuse retroperitoneal lymphadenopathy. -Liver biopsy from 04/23/20 shows diffuse large B-Cell lymphoma,ABC subtype, which carries a worse prognosis. Given his liver metastasis and LN involvement, this is stage IV. His lymphoma FISH panel isnegative fordouble hit lymphoma -I started him onInpatient R-EPOCHq3weeksfor 6 cycleson11/23/21.Given hishigh risk for CNS involvement,I startedintrathecal treatmentwith methotrexateprophylacticallyfromC2. -S/p C3 he had excellent partial response on 07/04/20 PET scan.  -S/p C4, he is doing very well, asymptomatic except a mild fatigue.  Lab reviewed, mild thrombocytopenia, anemia has improved, no need  for blood transfusion. -Plan to admit him to [facility] on Monday, February 21 at 122, with cycle 5 chemotherapy   2. Pancytopenia, secondary to chemo -He was given GCSF after Cycle 1.He still had pancytopenia with neutropenic fever. Hewas treated as inpatient and required blood and platelet transfusion. -He last required blood transfusion on 06/14/20. but none since then  -mild, stable   3. Fatigue, Low appetite,and weight loss -secondary to #1and chemotherapy -overall much better, he has been gaining weight back    PLAN: -lab reviewed  -Plan to admit on 2/21 for cycle 5 EPOCH chemo with same dose as cycle 4, and IT MTX on day 2, he will get COVID test on Saturday  -Rituxan and pegfilgrastim on day 8 in office  -lab and f/u before cycle 6 (last cycle)    No problem-specific Assessment & Plan notes found for this encounter.   No orders of the defined types were placed in this encounter.  All questions were answered. The patient knows to call the clinic with any problems, questions or concerns. No barriers to learning was detected.      Truitt Merle, MD 07/26/2020   I, Joslyn Devon, am acting as scribe for Truitt Merle, MD.   I have reviewed the above  documentation for accuracy and completeness, and I agree with the above.

## 2020-07-26 ENCOUNTER — Inpatient Hospital Stay: Payer: Medicare HMO

## 2020-07-26 ENCOUNTER — Encounter: Payer: Self-pay | Admitting: Hematology

## 2020-07-26 ENCOUNTER — Inpatient Hospital Stay: Payer: Medicare HMO | Admitting: Hematology

## 2020-07-26 ENCOUNTER — Other Ambulatory Visit: Payer: Self-pay

## 2020-07-26 ENCOUNTER — Telehealth: Payer: Self-pay | Admitting: Hematology

## 2020-07-26 VITALS — BP 135/68 | HR 91 | Temp 98.1°F | Resp 18 | Ht 71.0 in | Wt 210.0 lb

## 2020-07-26 DIAGNOSIS — C8338 Diffuse large B-cell lymphoma, lymph nodes of multiple sites: Secondary | ICD-10-CM

## 2020-07-26 DIAGNOSIS — E876 Hypokalemia: Secondary | ICD-10-CM

## 2020-07-26 DIAGNOSIS — C8339 Diffuse large B-cell lymphoma, extranodal and solid organ sites: Secondary | ICD-10-CM

## 2020-07-26 DIAGNOSIS — Z95828 Presence of other vascular implants and grafts: Secondary | ICD-10-CM

## 2020-07-26 DIAGNOSIS — Z5112 Encounter for antineoplastic immunotherapy: Secondary | ICD-10-CM | POA: Diagnosis not present

## 2020-07-26 DIAGNOSIS — E883 Tumor lysis syndrome: Secondary | ICD-10-CM

## 2020-07-26 LAB — CBC WITH DIFFERENTIAL (CANCER CENTER ONLY)
Abs Immature Granulocytes: 0.44 10*3/uL — ABNORMAL HIGH (ref 0.00–0.07)
Basophils Absolute: 0.1 10*3/uL (ref 0.0–0.1)
Basophils Relative: 1 %
Eosinophils Absolute: 0.1 10*3/uL (ref 0.0–0.5)
Eosinophils Relative: 1 %
HCT: 28.8 % — ABNORMAL LOW (ref 39.0–52.0)
Hemoglobin: 9.6 g/dL — ABNORMAL LOW (ref 13.0–17.0)
Immature Granulocytes: 6 %
Lymphocytes Relative: 4 %
Lymphs Abs: 0.3 10*3/uL — ABNORMAL LOW (ref 0.7–4.0)
MCH: 30.3 pg (ref 26.0–34.0)
MCHC: 33.3 g/dL (ref 30.0–36.0)
MCV: 90.9 fL (ref 80.0–100.0)
Monocytes Absolute: 0.9 10*3/uL (ref 0.1–1.0)
Monocytes Relative: 12 %
Neutro Abs: 5.8 10*3/uL (ref 1.7–7.7)
Neutrophils Relative %: 76 %
Platelet Count: 106 10*3/uL — ABNORMAL LOW (ref 150–400)
RBC: 3.17 MIL/uL — ABNORMAL LOW (ref 4.22–5.81)
RDW: 17.2 % — ABNORMAL HIGH (ref 11.5–15.5)
WBC Count: 7.7 10*3/uL (ref 4.0–10.5)
nRBC: 0 % (ref 0.0–0.2)

## 2020-07-26 LAB — CMP (CANCER CENTER ONLY)
ALT: 16 U/L (ref 0–44)
AST: 18 U/L (ref 15–41)
Albumin: 3.2 g/dL — ABNORMAL LOW (ref 3.5–5.0)
Alkaline Phosphatase: 77 U/L (ref 38–126)
Anion gap: 7 (ref 5–15)
BUN: 12 mg/dL (ref 8–23)
CO2: 25 mmol/L (ref 22–32)
Calcium: 8.3 mg/dL — ABNORMAL LOW (ref 8.9–10.3)
Chloride: 108 mmol/L (ref 98–111)
Creatinine: 0.86 mg/dL (ref 0.61–1.24)
GFR, Estimated: >60 mL/min (ref >60)
Glucose, Bld: 127 mg/dL — ABNORMAL HIGH (ref 70–99)
Potassium: 3.9 mmol/L (ref 3.5–5.1)
Sodium: 140 mmol/L (ref 135–145)
Total Bilirubin: 0.4 mg/dL (ref 0.3–1.2)
Total Protein: 5.7 g/dL — ABNORMAL LOW (ref 6.5–8.1)

## 2020-07-26 MED ORDER — SODIUM CHLORIDE 0.9% FLUSH
10.0000 mL | INTRAVENOUS | Status: DC | PRN
  Administered 2020-07-26: 10 mL via INTRAVENOUS
  Filled 2020-07-26: qty 10

## 2020-07-26 MED ORDER — HEPARIN SOD (PORK) LOCK FLUSH 100 UNIT/ML IV SOLN
500.0000 [IU] | Freq: Once | INTRAVENOUS | Status: AC
  Administered 2020-07-26: 500 [IU] via INTRAVENOUS
  Filled 2020-07-26: qty 5

## 2020-07-26 NOTE — Patient Instructions (Signed)
Implanted Port Insertion, Care After This sheet gives you information about how to care for yourself after your procedure. Your health care provider may also give you more specific instructions. If you have problems or questions, contact your health care provider. What can I expect after the procedure? After the procedure, it is common to have:  Discomfort at the port insertion site.  Bruising on the skin over the port. This should improve over 3-4 days. Follow these instructions at home: Port care  After your port is placed, you will get a manufacturer's information card. The card has information about your port. Keep this card with you at all times.  Take care of the port as told by your health care provider. Ask your health care provider if you or a family member can get training for taking care of the port at home. A home health care nurse may also take care of the port.  Make sure to remember what type of port you have. Incision care  Follow instructions from your health care provider about how to take care of your port insertion site. Make sure you: ? Wash your hands with soap and water before and after you change your bandage (dressing). If soap and water are not available, use hand sanitizer. ? Change your dressing as told by your health care provider. ? Leave stitches (sutures), skin glue, or adhesive strips in place. These skin closures may need to stay in place for 2 weeks or longer. If adhesive strip edges start to loosen and curl up, you may trim the loose edges. Do not remove adhesive strips completely unless your health care provider tells you to do that.  Check your port insertion site every day for signs of infection. Check for: ? Redness, swelling, or pain. ? Fluid or blood. ? Warmth. ? Pus or a bad smell.      Activity  Return to your normal activities as told by your health care provider. Ask your health care provider what activities are safe for you.  Do not  lift anything that is heavier than 10 lb (4.5 kg), or the limit that you are told, until your health care provider says that it is safe. General instructions  Take over-the-counter and prescription medicines only as told by your health care provider.  Do not take baths, swim, or use a hot tub until your health care provider approves. Ask your health care provider if you may take showers. You may only be allowed to take sponge baths.  Do not drive for 24 hours if you were given a sedative during your procedure.  Wear a medical alert bracelet in case of an emergency. This will tell any health care providers that you have a port.  Keep all follow-up visits as told by your health care provider. This is important. Contact a health care provider if:  You cannot flush your port with saline as directed, or you cannot draw blood from the port.  You have a fever or chills.  You have redness, swelling, or pain around your port insertion site.  You have fluid or blood coming from your port insertion site.  Your port insertion site feels warm to the touch.  You have pus or a bad smell coming from the port insertion site. Get help right away if:  You have chest pain or shortness of breath.  You have bleeding from your port that you cannot control. Summary  Take care of the port as told by your   health care provider. Keep the manufacturer's information card with you at all times.  Change your dressing as told by your health care provider.  Contact a health care provider if you have a fever or chills or if you have redness, swelling, or pain around your port insertion site.  Keep all follow-up visits as told by your health care provider. This information is not intended to replace advice given to you by your health care provider. Make sure you discuss any questions you have with your health care provider. Document Revised: 12/22/2017 Document Reviewed: 12/22/2017 [employer]vier Patient Education   2021 [employer]  

## 2020-07-26 NOTE — Telephone Encounter (Signed)
Scheduled follow-up appointments per 2/17 los. Patient is aware. 

## 2020-07-28 ENCOUNTER — Other Ambulatory Visit (HOSPITAL_COMMUNITY)
Admission: RE | Admit: 2020-07-28 | Discharge: 2020-07-28 | Disposition: A | Discharge status: Home / Self Care | Payer: Medicare HMO | Source: Ambulatory Visit | Attending: Hematology | Admitting: Hematology

## 2020-07-28 DIAGNOSIS — Z20822 Contact with and (suspected) exposure to covid-19: Secondary | ICD-10-CM | POA: Insufficient documentation

## 2020-07-28 DIAGNOSIS — Z01812 Encounter for preprocedural laboratory examination: Secondary | ICD-10-CM | POA: Insufficient documentation

## 2020-07-28 LAB — SARS CORONAVIRUS 2 (TAT 6-24 HRS): SARS Coronavirus 2: NEGATIVE

## 2020-07-30 ENCOUNTER — Other Ambulatory Visit: Payer: Self-pay

## 2020-07-30 ENCOUNTER — Inpatient Hospital Stay (HOSPITAL_COMMUNITY)
Admission: RE | Admit: 2020-07-30 | Discharge: 2020-08-03 | DRG: 842 | Disposition: A | Discharge status: Home / Self Care | Payer: Medicare HMO | Attending: Hematology | Admitting: Hematology

## 2020-07-30 ENCOUNTER — Encounter (HOSPITAL_COMMUNITY): Payer: Self-pay | Admitting: Hematology

## 2020-07-30 DIAGNOSIS — C8338 Diffuse large B-cell lymphoma, lymph nodes of multiple sites: Principal | ICD-10-CM | POA: Diagnosis present

## 2020-07-30 DIAGNOSIS — I10 Essential (primary) hypertension: Secondary | ICD-10-CM | POA: Diagnosis present

## 2020-07-30 DIAGNOSIS — F5101 Primary insomnia: Secondary | ICD-10-CM

## 2020-07-30 DIAGNOSIS — N400 Enlarged prostate without lower urinary tract symptoms: Secondary | ICD-10-CM | POA: Diagnosis present

## 2020-07-30 DIAGNOSIS — C8330 Diffuse large B-cell lymphoma, unspecified site: Secondary | ICD-10-CM

## 2020-07-30 DIAGNOSIS — R140 Abdominal distension (gaseous): Secondary | ICD-10-CM

## 2020-07-30 DIAGNOSIS — Z20822 Contact with and (suspected) exposure to covid-19: Secondary | ICD-10-CM | POA: Diagnosis present

## 2020-07-30 DIAGNOSIS — R5383 Other fatigue: Secondary | ICD-10-CM | POA: Diagnosis present

## 2020-07-30 DIAGNOSIS — Z6829 Body mass index (BMI) 29.0-29.9, adult: Secondary | ICD-10-CM

## 2020-07-30 DIAGNOSIS — R634 Abnormal weight loss: Secondary | ICD-10-CM | POA: Diagnosis present

## 2020-07-30 DIAGNOSIS — Z5111 Encounter for antineoplastic chemotherapy: Secondary | ICD-10-CM

## 2020-07-30 LAB — CBC WITH DIFFERENTIAL/PLATELET
Abs Immature Granulocytes: 0.06 10*3/uL (ref 0.00–0.07)
Basophils Absolute: 0.1 10*3/uL (ref 0.0–0.1)
Basophils Relative: 2 %
Eosinophils Absolute: 0.1 10*3/uL (ref 0.0–0.5)
Eosinophils Relative: 1 %
HCT: 30.7 % — ABNORMAL LOW (ref 39.0–52.0)
Hemoglobin: 10 g/dL — ABNORMAL LOW (ref 13.0–17.0)
Immature Granulocytes: 1 %
Lymphocytes Relative: 7 %
Lymphs Abs: 0.3 10*3/uL — ABNORMAL LOW (ref 0.7–4.0)
MCH: 30.4 pg (ref 26.0–34.0)
MCHC: 32.6 g/dL (ref 30.0–36.0)
MCV: 93.3 fL (ref 80.0–100.0)
Monocytes Absolute: 0.9 10*3/uL (ref 0.1–1.0)
Monocytes Relative: 17 %
Neutro Abs: 3.9 10*3/uL (ref 1.7–7.7)
Neutrophils Relative %: 72 %
Platelets: 127 10*3/uL — ABNORMAL LOW (ref 150–400)
RBC: 3.29 MIL/uL — ABNORMAL LOW (ref 4.22–5.81)
RDW: 17.2 % — ABNORMAL HIGH (ref 11.5–15.5)
WBC: 5.3 10*3/uL (ref 4.0–10.5)
nRBC: 0.4 % — ABNORMAL HIGH (ref 0.0–0.2)

## 2020-07-30 LAB — COMPREHENSIVE METABOLIC PANEL
ALT: 19 U/L (ref 0–44)
AST: 22 U/L (ref 15–41)
Albumin: 3.6 g/dL (ref 3.5–5.0)
Alkaline Phosphatase: 62 U/L (ref 38–126)
Anion gap: 9 (ref 5–15)
BUN: 18 mg/dL (ref 8–23)
CO2: 24 mmol/L (ref 22–32)
Calcium: 9 mg/dL (ref 8.9–10.3)
Chloride: 104 mmol/L (ref 98–111)
Creatinine, Ser: 0.87 mg/dL (ref 0.61–1.24)
GFR, Estimated: >60 mL/min (ref >60)
Glucose, Bld: 127 mg/dL — ABNORMAL HIGH (ref 70–99)
Potassium: 3.8 mmol/L (ref 3.5–5.1)
Sodium: 137 mmol/L (ref 135–145)
Total Bilirubin: 0.8 mg/dL (ref 0.3–1.2)
Total Protein: 6 g/dL — ABNORMAL LOW (ref 6.5–8.1)

## 2020-07-30 LAB — URIC ACID: Uric Acid, Serum: 5.8 mg/dL (ref 3.7–8.6)

## 2020-07-30 LAB — LACTATE DEHYDROGENASE: LDH: 199 U/L — ABNORMAL HIGH (ref 98–192)

## 2020-07-30 MED ORDER — LIDOCAINE-PRILOCAINE 2.5-2.5 % EX CREA: 1.0000 "application " | TOPICAL_CREAM | CUTANEOUS | Status: DC | PRN

## 2020-07-30 MED ORDER — MELATONIN 5 MG PO TABS: 5.0000 mg | ORAL_TABLET | Freq: Every day | ORAL | Status: DC

## 2020-07-30 MED ORDER — SODIUM CHLORIDE 0.9% FLUSH: 10.0000 mL | INTRAVENOUS | Status: DC | PRN

## 2020-07-30 MED ORDER — SODIUM CHLORIDE 0.9 % IV SOLN
Freq: Once | INTRAVENOUS | Status: AC
  Administered 2020-07-30: 8 mg via INTRAVENOUS
  Filled 2020-07-30: qty 4

## 2020-07-30 MED ORDER — MAGIC MOUTHWASH W/LIDOCAINE: 15.0000 mL | Freq: Four times a day (QID) | ORAL | Status: DC | PRN

## 2020-07-30 MED ORDER — COLD PACK MISC ONCOLOGY
1.0000 | Freq: Once | Status: DC | PRN
  Filled 2020-07-30: qty 1

## 2020-07-30 MED ORDER — SODIUM CHLORIDE 0.9% FLUSH
10.0000 mL | Freq: Two times a day (BID) | INTRAVENOUS | Status: DC
  Administered 2020-07-30: 10 mL
  Administered 2020-07-30 – 2020-07-31 (×2): 20 mL
  Administered 2020-07-31 – 2020-08-02 (×5): 10 mL

## 2020-07-30 MED ORDER — VINCRISTINE SULFATE CHEMO INJECTION 1 MG/ML
Freq: Once | INTRAVENOUS | Status: AC
  Filled 2020-07-30: qty 8

## 2020-07-30 MED ORDER — SODIUM BICARBONATE/SODIUM CHLORIDE MOUTHWASH
1.0000 "application " | Freq: Four times a day (QID) | OROMUCOSAL | Status: DC
  Administered 2020-07-30 – 2020-08-02 (×15): 1 via OROMUCOSAL
  Filled 2020-07-30: qty 1000

## 2020-07-30 MED ORDER — DOXAZOSIN MESYLATE 4 MG PO TABS
4.0000 mg | ORAL_TABLET | Freq: Every day | ORAL | Status: DC
  Administered 2020-07-31 – 2020-08-03 (×4): 4 mg via ORAL
  Filled 2020-07-30 (×4): qty 1

## 2020-07-30 MED ORDER — MELATONIN 3 MG PO TABS
6.0000 mg | ORAL_TABLET | Freq: Every evening | ORAL | Status: DC | PRN
  Administered 2020-07-30 – 2020-08-02 (×4): 6 mg via ORAL
  Filled 2020-07-30 (×4): qty 2

## 2020-07-30 MED ORDER — CHLORHEXIDINE GLUCONATE CLOTH 2 % EX PADS
6.0000 | MEDICATED_PAD | Freq: Every day | CUTANEOUS | Status: DC
  Administered 2020-07-31 – 2020-08-02 (×3): 6 via TOPICAL

## 2020-07-30 MED ORDER — SENNOSIDES-DOCUSATE SODIUM 8.6-50 MG PO TABS
2.0000 | ORAL_TABLET | Freq: Two times a day (BID) | ORAL | Status: DC
  Administered 2020-07-30 – 2020-08-03 (×8): 2 via ORAL
  Filled 2020-07-30 (×8): qty 2

## 2020-07-30 MED ORDER — ALLOPURINOL 100 MG PO TABS
100.0000 mg | ORAL_TABLET | Freq: Every day | ORAL | Status: DC
  Administered 2020-07-31 – 2020-08-03 (×4): 100 mg via ORAL
  Filled 2020-07-30 (×4): qty 1

## 2020-07-30 MED ORDER — HYDROCODONE-ACETAMINOPHEN 5-325 MG PO TABS
1.0000 | ORAL_TABLET | Freq: Four times a day (QID) | ORAL | Status: DC | PRN
Start: 2020-07-30 — End: 2020-08-03

## 2020-07-30 MED ORDER — ONDANSETRON HCL 8 MG PO TABS: 8.0000 mg | ORAL_TABLET | Freq: Three times a day (TID) | ORAL | Status: DC | PRN

## 2020-07-30 MED ORDER — HOT PACK MISC ONCOLOGY
1.0000 | Freq: Once | Status: DC | PRN
  Filled 2020-07-30: qty 1

## 2020-07-30 MED ORDER — METOPROLOL TARTRATE 25 MG PO TABS
12.5000 mg | ORAL_TABLET | Freq: Two times a day (BID) | ORAL | Status: DC
  Administered 2020-07-30 – 2020-08-03 (×8): 12.5 mg via ORAL
  Filled 2020-07-30 (×8): qty 1

## 2020-07-30 MED ORDER — SIMETHICONE 80 MG PO CHEW: 80.0000 mg | CHEWABLE_TABLET | Freq: Four times a day (QID) | ORAL | Status: DC | PRN

## 2020-07-30 MED ORDER — SODIUM CHLORIDE 0.9 % IV SOLN: INTRAVENOUS | Status: DC

## 2020-07-30 MED ORDER — PANTOPRAZOLE SODIUM 40 MG PO TBEC
40.0000 mg | DELAYED_RELEASE_TABLET | Freq: Every day | ORAL | Status: DC
  Administered 2020-07-31 – 2020-08-03 (×4): 40 mg via ORAL
  Filled 2020-07-30 (×4): qty 1

## 2020-07-30 MED ORDER — ADULT MULTIVITAMIN W/MINERALS CH
ORAL_TABLET | Freq: Every day | ORAL | Status: DC
  Administered 2020-07-31 – 2020-08-03 (×4): 1 via ORAL
  Filled 2020-07-30 (×4): qty 1

## 2020-07-30 MED ORDER — POLYETHYLENE GLYCOL 3350 17 G PO PACK
17.0000 g | PACK | Freq: Every day | ORAL | Status: DC
  Administered 2020-07-31 – 2020-08-03 (×4): 17 g via ORAL
  Filled 2020-07-30 (×3): qty 1

## 2020-07-30 MED ORDER — PROCHLORPERAZINE MALEATE 10 MG PO TABS: 10.0000 mg | ORAL_TABLET | Freq: Four times a day (QID) | ORAL | Status: DC | PRN

## 2020-07-30 NOTE — H&P (Addendum)
[place]  [phone] [phone]   MEDICAL ONCOLOGY - ADMISSION H&P I have seen the patient, examined him and agree with the documentation as follows  Reason for Admission: Cycle #5 EPOCH-R  HPI: John [name] is a 75 year old Caucasian male, with past medical history of hypertension, BPH.  He was admitted to [facility] in November 2021 due to fatigue and constipation.  He was found to have severe hypercalcemia and CT scan showed a large mediastinal and liver mass with diffuse retroperitoneal lymphadenopathy.  He received IV fluids and pamidronate for treatment of hypercalcemia with resolution.  He also had hyperuricemia and was treated with rasburicase and then placed on allopurinol.  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  The patient was discharged home with a course of prednisone x5 days. Once biopsy resulted, it was recommended him to begin inpatient EPOCH-R for treatment of his lymphoma.    The patient is seen this morning for admission to the hospital.  He denies recent fevers, chills, chest pain, cough, shortness of breath.  He denies headaches and dizziness.  Denies mucositis.  Reports improvement in his appetite.  Denies abdominal pain, nausea, vomiting.  Bowels are moving without difficulty.  Lower extremity edema has resolved.  Denies peripheral neuropathy.  The patient reports that he has been driving more and more active at home. He patient is seen today for admission for cycle #5 of EPOCH-R.   Past Medical History:  Diagnosis Date  . Cancer ([place])   . Goals of care, counseling/discussion 05/06/2020  . Hypertension   . Knee pain, right     Socioeconomic History  . Marital status: Married    Spouse name: Not on file  . Number of children: 0  . Years of education: Not on file  . Highest education level: Not on file  Occupational History   . Occupation: retired Customer service manager   Tobacco Use  . Smoking status: Never Smoker  . Smokeless tobacco: Never Used  Vaping Use  . Vaping Use: Never used  Substance and Sexual Activity  . Alcohol use: No  . Drug use: No  . Sexual activity: Not on file  Other Topics Concern  . Not on file  Social History Narrative  . Not on file   Social Determinants of Health   Financial Resource Strain: Not on file  Food Insecurity: Not on file  Transportation Needs: Not on file  Physical Activity: Not on file  Stress: Not on file  Social Connections: Not on file  Intimate Partner Violence: Not on file  :  Review of Systems: A comprehensive 14 point review of systems was negative except as noted in the HPI.  Exam: Patient Vitals for the past 24 hrs: BP 111/62 (BP Location: Left Arm)   Pulse 89   Temp 97.8 F (36.6 C) (Oral)   Resp 18   SpO2 98%   General:  well-nourished in no acute distress.   Eyes:  no scleral icterus.   ENT:  There were no oropharyngeal lesions.     Respiratory: lungs were clear bilaterally without wheezing or crackles.   Cardiovascular:  Regular rate and rhythm, S1/S2, without murmur, rub or gallop.  No pedal edema bilaterally. GI:  abdomen was soft, flat, nontender, nondistended, without organomegaly.   Musculoskeletal: Strength symmetrical in the upper and lower extremities. Skin exam was without echymosis, petichae.  Neuro exam was nonfocal. Patient was alert and oriented.  Attention was good.   Language was appropriate.  Mood was normal without depression.  Speech was not pressured.  Thought content was not tangential.    CBC    Component Value Date/Time   WBC 5.3 07/30/2020 0835   RBC 3.29 (L) 07/30/2020 0835   HGB 10.0 (L) 07/30/2020 0835   HGB 9.6 (L) 07/26/2020 1249   HCT 30.7 (L) 07/30/2020 0835   PLT 127 (L) 07/30/2020 0835   PLT 106 (L) 07/26/2020 1249   MCV 93.3 07/30/2020 0835   MCH 30.4 07/30/2020 0835   MCHC 32.6 07/30/2020 0835    RDW 17.2 (H) 07/30/2020 0835   LYMPHSABS 0.3 (L) 07/30/2020 0835   MONOABS 0.9 07/30/2020 0835   EOSABS 0.1 07/30/2020 0835   BASOSABS 0.1 07/30/2020 0835   CMP Latest Ref Rng & Units 07/30/2020 07/26/2020 07/23/2020  Glucose 70 - 99 mg/dL 127(H) 127(H) 161(H)  BUN 8 - 23 mg/dL 18 12 13   Creatinine 0.61 - 1.24 mg/dL 0.87 0.86 0.87  Sodium 135 - 145 mmol/L 137 140 139  Potassium 3.5 - 5.1 mmol/L 3.8 3.9 3.8  Chloride 98 - 111 mmol/L 104 108 107  CO2 22 - 32 mmol/L 24 25 24   Calcium 8.9 - 10.3 mg/dL 9.0 8.3(L) 8.6(L)  Total Protein 6.5 - 8.1 g/dL 6.0(L) 5.7(L) 5.6(L)  Total Bilirubin 0.3 - 1.2 mg/dL 0.8 0.4 0.4  Alkaline Phos 38 - 126 U/L 62 77 73  AST 15 - 41 U/L 22 18 18   ALT 0 - 44 U/L 19 16 23     DG FLUORO GUIDED NEEDLE PLC ASPIRATION/INJECTION LOC  Result Date: 05/30/2020 CLINICAL DATA:  Intrathecal methotrexate injection for lymphoma. EXAM: FLUOROSCOPICALLY GUIDED LUMBAR PUNCTURE FOR INTRATHECAL CHEMOTHERAPY FLUOROSCOPY TIME:  1 minutes 0 seconds. PROCEDURE: Informed consent was obtained from the patient prior to the procedure, including potential complications of headache, allergy, and pain. With the patient prone, the lower back was prepped with Betadine. 1% Lidocaine was used for local anesthesia. Lumbar puncture was performed at the L3-4 level using a gauge needle with return of initially blood tinged but then clearCSF. 12 mg of methotrexate was injected into the subarachnoid space. The patient tolerated the procedure well without apparent complication. IMPRESSION: Successful intrathecal methotrexate injection under fluoroscopy. Electronically Signed   By: Lorin Picket M.D.   On: 05/30/2020 13:10   IR IMAGING GUIDED PORT INSERTION  Result Date: 05/28/2020 INDICATION: Recent diagnosis of large B-cell lymphoma. In need of durable intravenous access for chemotherapy administration. EXAM: IMPLANTED PORT A CATH PLACEMENT WITH ULTRASOUND AND FLUOROSCOPIC GUIDANCE COMPARISON:  CT  the chest, abdomen and pelvis-04/20/2020 MEDICATIONS: Ancef 2 gm IV; The antibiotic was administered within an appropriate time interval prior to skin puncture. ANESTHESIA/SEDATION: Moderate (conscious) sedation was employed during this procedure. A total of Versed 4 mg and Fentanyl 100 mcg was administered intravenously. Moderate Sedation Time: 27 minutes. The patient's level of consciousness and vital signs were monitored continuously by radiology nursing throughout the procedure under my direct supervision. CONTRAST:  None FLUOROSCOPY TIME:  18 seconds (8 mGy) COMPLICATIONS: None immediate. PROCEDURE: The procedure, risks, benefits, and alternatives were explained to the patient. Questions regarding the procedure were encouraged and answered. The patient understands and consents to the procedure. The right neck and chest were prepped with chlorhexidine in a sterile fashion, and a sterile drape was applied covering the operative field. Maximum barrier sterile technique with sterile gowns and gloves were used for  the procedure. A timeout was performed prior to the initiation of the procedure. Local anesthesia was provided with 1% lidocaine with epinephrine. After creating a small venotomy incision, a micropuncture kit was utilized to access the internal jugular vein. Real-time ultrasound guidance was utilized for vascular access including the acquisition of a permanent ultrasound image documenting patency of the accessed vessel. The microwire was utilized to measure appropriate catheter length. A subcutaneous port pocket was then created along the upper chest wall utilizing a combination of sharp and blunt dissection. The pocket was irrigated with sterile saline. A single lumen "Slim" sized power injectable port was chosen for placement. The 8 Fr catheter was tunneled from the port pocket site to the venotomy incision. The port was placed in the pocket. The external catheter was trimmed to appropriate length. At  the venotomy, an 8 Fr peel-away sheath was placed over a guidewire under fluoroscopic guidance. The catheter was then placed through the sheath and the sheath was removed. Final catheter positioning was confirmed and documented with a fluoroscopic spot radiograph. The port was accessed with a Huber needle, aspirated and flushed with heparinized saline. The venotomy site was closed with an interrupted 4-0 Vicryl suture. The port pocket incision was closed with interrupted 2-0 Vicryl suture. The skin was opposed with a running subcuticular 4-0 Vicryl suture. Dermabond and Steri-strips were applied to both incisions. Dressings were applied. The patient tolerated the procedure well without immediate post procedural complication. FINDINGS: After catheter placement, the tip lies within the superior cavoatrial junction. The catheter aspirates and flushes normally and is ready for immediate use. The port a Catheter was left accessed for the patient's impending hospitalization and initiation of chemotherapy. IMPRESSION: Successful placement of a right internal jugular approach power injectable Port-A-Cath. The catheter is ready for immediate use. Electronically Signed   By: Sandi Mariscal M.D.   On: 05/28/2020 10:55     DG FLUORO GUIDED NEEDLE PLC ASPIRATION/INJECTION LOC  Result Date: 05/30/2020 CLINICAL DATA:  Intrathecal methotrexate injection for lymphoma. EXAM: FLUOROSCOPICALLY GUIDED LUMBAR PUNCTURE FOR INTRATHECAL CHEMOTHERAPY FLUOROSCOPY TIME:  1 minutes 0 seconds. PROCEDURE: Informed consent was obtained from the patient prior to the procedure, including potential complications of headache, allergy, and pain. With the patient prone, the lower back was prepped with Betadine. 1% Lidocaine was used for local anesthesia. Lumbar puncture was performed at the L3-4 level using a gauge needle with return of initially blood tinged but then clearCSF. 12 mg of methotrexate was injected into the subarachnoid space. The  patient tolerated the procedure well without apparent complication. IMPRESSION: Successful intrathecal methotrexate injection under fluoroscopy. Electronically Signed   By: Lorin Picket M.D.   On: 05/30/2020 13:10   IR IMAGING GUIDED PORT INSERTION  Result Date: 05/28/2020 INDICATION: Recent diagnosis of large B-cell lymphoma. In need of durable intravenous access for chemotherapy administration. EXAM: IMPLANTED PORT A CATH PLACEMENT WITH ULTRASOUND AND FLUOROSCOPIC GUIDANCE COMPARISON:  CT the chest, abdomen and pelvis-04/20/2020 MEDICATIONS: Ancef 2 gm IV; The antibiotic was administered within an appropriate time interval prior to skin puncture. ANESTHESIA/SEDATION: Moderate (conscious) sedation was employed during this procedure. A total of Versed 4 mg and Fentanyl 100 mcg was administered intravenously. Moderate Sedation Time: 27 minutes. The patient's level of consciousness and vital signs were monitored continuously by radiology nursing throughout the procedure under my direct supervision. CONTRAST:  None FLUOROSCOPY TIME:  18 seconds (8 mGy) COMPLICATIONS: None immediate. PROCEDURE: The procedure, risks, benefits, and alternatives were explained to the patient. Questions regarding  the procedure were encouraged and answered. The patient understands and consents to the procedure. The right neck and chest were prepped with chlorhexidine in a sterile fashion, and a sterile drape was applied covering the operative field. Maximum barrier sterile technique with sterile gowns and gloves were used for the procedure. A timeout was performed prior to the initiation of the procedure. Local anesthesia was provided with 1% lidocaine with epinephrine. After creating a small venotomy incision, a micropuncture kit was utilized to access the internal jugular vein. Real-time ultrasound guidance was utilized for vascular access including the acquisition of a permanent ultrasound image documenting patency of the accessed  vessel. The microwire was utilized to measure appropriate catheter length. A subcutaneous port pocket was then created along the upper chest wall utilizing a combination of sharp and blunt dissection. The pocket was irrigated with sterile saline. A single lumen "Slim" sized power injectable port was chosen for placement. The 8 Fr catheter was tunneled from the port pocket site to the venotomy incision. The port was placed in the pocket. The external catheter was trimmed to appropriate length. At the venotomy, an 8 Fr peel-away sheath was placed over a guidewire under fluoroscopic guidance. The catheter was then placed through the sheath and the sheath was removed. Final catheter positioning was confirmed and documented with a fluoroscopic spot radiograph. The port was accessed with a Huber needle, aspirated and flushed with heparinized saline. The venotomy site was closed with an interrupted 4-0 Vicryl suture. The port pocket incision was closed with interrupted 2-0 Vicryl suture. The skin was opposed with a running subcuticular 4-0 Vicryl suture. Dermabond and Steri-strips were applied to both incisions. Dressings were applied. The patient tolerated the procedure well without immediate post procedural complication. FINDINGS: After catheter placement, the tip lies within the superior cavoatrial junction. The catheter aspirates and flushes normally and is ready for immediate use. The port a Catheter was left accessed for the patient's impending hospitalization and initiation of chemotherapy. IMPRESSION: Successful placement of a right internal jugular approach power injectable Port-A-Cath. The catheter is ready for immediate use. Electronically Signed   By: Sandi Mariscal M.D.   On: 05/28/2020 10:55   Assessment and Plan:   1.  Stage IVa diffuse large B-cell lymphoma, ABC subtype 2.  Fatigue secondary #1 3.  Anemia secondary to recent chemotherapy 4.  Recent significant weight loss  -Labs from today been  reviewed.  He will proceed with day 1 of cycle 5 of his chemotherapy today as planned.  We will check a daily CBC with differential, CMET, uric acid. -He is also at high risk for CNS involvement due to high risk features and will plan to administer intrathecal methotrexate with this cycle.  Will ask for radiology to administer this on day 2 of the cycle of chemotherapy. -Aspirin has been placed on hold in anticipation of intrathecal methotrexate.  Prophylactic Lovenox has not been ordered due to impending procedure.  Will reevaluate post procedure.  SCDs for DVT prophylaxis. -We will continue home bowel regimen including MiraLAX and Senokot-S. -He has Zofran and Compazine as needed for nausea and vomiting. -Continue allopurinol and other home medications. -Sodium bicarbonate mouth rinses and Magic mouthwash have been ordered.  Mikey Bussing, DNP, AGPCNP-BC, AOCNP  Addendum  I have seen the patient, examined him. I agree with the assessment and and plan and have edited the notes.   Mr. Goldston is doing well. Lab reviewed, adequate for treatment, will start C5D1 chemo today, plan to do  IT MTX tomorrow. Questions were answered and orders are reviewed and signed.   Truitt Merle  07/30/2020

## 2020-07-31 ENCOUNTER — Inpatient Hospital Stay (HOSPITAL_COMMUNITY): Payer: Medicare HMO

## 2020-07-31 DIAGNOSIS — C8338 Diffuse large B-cell lymphoma, lymph nodes of multiple sites: Secondary | ICD-10-CM | POA: Diagnosis not present

## 2020-07-31 LAB — CBC WITH DIFFERENTIAL/PLATELET
Abs Immature Granulocytes: 0.07 10*3/uL (ref 0.00–0.07)
Basophils Absolute: 0 10*3/uL (ref 0.0–0.1)
Basophils Relative: 0 %
Eosinophils Absolute: 0 10*3/uL (ref 0.0–0.5)
Eosinophils Relative: 0 %
HCT: 29.9 % — ABNORMAL LOW (ref 39.0–52.0)
Hemoglobin: 9.9 g/dL — ABNORMAL LOW (ref 13.0–17.0)
Immature Granulocytes: 1 %
Lymphocytes Relative: 4 %
Lymphs Abs: 0.3 10*3/uL — ABNORMAL LOW (ref 0.7–4.0)
MCH: 30.7 pg (ref 26.0–34.0)
MCHC: 33.1 g/dL (ref 30.0–36.0)
MCV: 92.9 fL (ref 80.0–100.0)
Monocytes Absolute: 0.6 10*3/uL (ref 0.1–1.0)
Monocytes Relative: 8 %
Neutro Abs: 7 10*3/uL (ref 1.7–7.7)
Neutrophils Relative %: 87 %
Platelets: 126 10*3/uL — ABNORMAL LOW (ref 150–400)
RBC: 3.22 MIL/uL — ABNORMAL LOW (ref 4.22–5.81)
RDW: 16.7 % — ABNORMAL HIGH (ref 11.5–15.5)
WBC: 8 10*3/uL (ref 4.0–10.5)
nRBC: 0 % (ref 0.0–0.2)

## 2020-07-31 LAB — COMPREHENSIVE METABOLIC PANEL
ALT: 18 U/L (ref 0–44)
AST: 20 U/L (ref 15–41)
Albumin: 3.4 g/dL — ABNORMAL LOW (ref 3.5–5.0)
Alkaline Phosphatase: 53 U/L (ref 38–126)
Anion gap: 9 (ref 5–15)
BUN: 19 mg/dL (ref 8–23)
CO2: 23 mmol/L (ref 22–32)
Calcium: 8.9 mg/dL (ref 8.9–10.3)
Chloride: 106 mmol/L (ref 98–111)
Creatinine, Ser: 0.69 mg/dL (ref 0.61–1.24)
GFR, Estimated: >60 mL/min (ref >60)
Glucose, Bld: 124 mg/dL — ABNORMAL HIGH (ref 70–99)
Potassium: 3.9 mmol/L (ref 3.5–5.1)
Sodium: 138 mmol/L (ref 135–145)
Total Bilirubin: 0.7 mg/dL (ref 0.3–1.2)
Total Protein: 5.6 g/dL — ABNORMAL LOW (ref 6.5–8.1)

## 2020-07-31 LAB — URIC ACID: Uric Acid, Serum: 5.7 mg/dL (ref 3.7–8.6)

## 2020-07-31 MED ORDER — ENOXAPARIN SODIUM 40 MG/0.4ML ~~LOC~~ SOLN
40.0000 mg | SUBCUTANEOUS | Status: AC
  Administered 2020-07-31 – 2020-08-02 (×3): 40 mg via SUBCUTANEOUS
  Filled 2020-07-31 (×3): qty 0.4

## 2020-07-31 MED ORDER — SODIUM CHLORIDE (PF) 0.9 % IJ SOLN
Freq: Once | INTRAMUSCULAR | Status: AC
  Filled 2020-07-31 (×2): qty 0.48

## 2020-07-31 MED ORDER — SODIUM CHLORIDE 0.9 % IV SOLN
Freq: Once | INTRAVENOUS | Status: AC
  Administered 2020-07-31: 8 mg via INTRAVENOUS
  Filled 2020-07-31: qty 4

## 2020-07-31 MED ORDER — VINCRISTINE SULFATE CHEMO INJECTION 1 MG/ML
Freq: Once | INTRAVENOUS | Status: AC
  Filled 2020-07-31: qty 8

## 2020-07-31 NOTE — Progress Notes (Addendum)
HEMATOLOGY-ONCOLOGY PROGRESS NOTE  SUBJECTIVE: John [name] tolerated day 1 of cycle 5 of his chemotherapy well overall.  He is not having any problems with mucositis, chest pain, shortness of breath, abdominal pain, nausea, vomiting.  Bowels are moving without any difficulty.  No bleeding reported.  Currently n.p.o. in anticipation of intrathecal methotrexate later today.  He is ambulating in the hallway without any difficulty.  Oncology History Overview Note  Cancer Staging Diffuse large B cell lymphoma ([place]) Staging form: Hodgkin and Non-Hodgkin Lymphoma, AJCC 8th Edition - Clinical stage from 04/23/2020: Stage IV (Diffuse large B-cell lymphoma) - Signed by Truitt Merle, MD on 04/29/2020    Diffuse large B cell lymphoma ([place])  04/20/2020 Imaging   CT CAP  IMPRESSION: 1. Large anterior mediastinal/prevascular space mass/adenopathy. The mass extends superiorly and abuts the upper trachea. 2. Large hypodense lesion in the right lobe of the liver as well as ill-defined splenic hypodense lesions. Further characterization with MRI without and with contrast recommended. The liver lesion is amenable to percutaneous tissue sampling. 3. Extensive mesenteric and retroperitoneal adenopathy. 4. Sigmoid diverticulosis. No bowel obstruction. Normal appendix. 5. Aortic Atherosclerosis (ICD10-I70.0).   04/23/2020 Cancer Staging   Staging form: Hodgkin and Non-Hodgkin Lymphoma, AJCC 8th Edition - Clinical stage from 04/23/2020: Stage IV (Diffuse large B-cell lymphoma) - Signed by Truitt Merle, MD on 04/29/2020   04/23/2020 Initial Biopsy   FINAL MICROSCOPIC DIAGNOSIS:   A. LIVER, RIGHT MASS, NEEDLE CORE BIOPSY:  - Large B-cell lymphoma  - See comment     COMMENT:   The sections show needle core biopsy fragments displaying effacement of  the architecture by a dense infiltrate of primarily large atypical  lymphoid appearing cells displaying vesicular chromatin and small  nucleoli.  This is  associated with apoptosis and brisk mitosis.  The  appearance is diffuse with lack of atypical follicles.  A large battery  of immunohistochemical stains was performed and shows that the atypical  lymphoid cells are positive for LCA, CD20, PAX 5, BCL-2, BCL6, CD5,  MUM-1 (partial).  The atypical lymphoid cells are negative for CD10,  CD30, CD34, CD138, cyclin D1, TdT, and EBV in situ hybridization,  synaptophysin, cytokeratin AE1/AE3, cytokeratin 20, cytokeratin 7,  cytokeratin 5/6, TTF-1, CD56, CDX2, and p40.  There is an admixed  relatively minor population of T-cells as primarily seen with CD3.  The  overall features are consistent with large B-cell lymphoma, ABC subtype.  The lymphomatous process displays expression of CD5.  This phenotype is  seen in 5-10% of mostly de novo large B-cell lymphoma cases or rarely as  a transformation of a low-grade B-cell lymphoma such as small  lymphocytic lymphoma.  Clinical correlation is recommended.    04/25/2020 Imaging   CT AP  IMPRESSION: 1. Development of high-density ascites in the abdomen and pelvis. Findings are most compatible with a small to moderate amount of hemoperitoneum and related to the recent liver biopsy. 2. Extensive lymphadenopathy throughout the abdomen and pelvis with a large mass or nodal mass near the pancreatic head. There are additional lesions involving the liver and spleen. Findings are suggestive for metastatic disease. 3. Development of small bilateral pleural effusions.   These results were called by telephone at the time of interpretation on 04/25/2020 at 1:43 pm to provider ERIC [place] who verbally acknowledged these results.   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 Marked reduction in volume of  anterior mediastinal mass now with low metabolic activity ( Deauville 2). 2. Reduction in volume of retroperitoneal nodal mass adjacent the pancreas with low metabolic activity ( Deauville 3). 3. No increased metabolic activity associated with the spleen 4. Interval contraction of hepatic hematoma post biopsy. No hypermetabolic elements within the hepatic lesion. 5. Grade 2 anterolisthesis L4 on L5.        REVIEW OF SYSTEMS:   Constitutional: Denies fevers, chills or abnormal weight loss Eyes: Denies blurriness of vision Ears, nose, mouth, throat, and face: Denies mucositis or sore throat Respiratory: Denies cough, dyspnea or wheezes Cardiovascular: Denies palpitation, chest discomfort Gastrointestinal:  Denies nausea, heartburn or change in bowel habits Skin: Denies abnormal skin rashes Lymphatics: Denies new lymphadenopathy or easy bruising Neurological:Denies numbness, tingling or new weaknesses Behavioral/Psych: Mood is stable, no new changes  Extremities: No lower extremity edema All other systems were reviewed with the patient and are negative.  I have reviewed the past medical history, past surgical history, social history and family history with the patient and they are unchanged from previous note.   PHYSICAL EXAMINATION: ECOG PERFORMANCE STATUS: 1 - Symptomatic but completely ambulatory  Vitals:   07/30/20 2047 07/31/20 0450  BP: 123/77 120/69  Pulse: (!) 105 82  Resp: 18 16  Temp: 98.2 F (36.8 C) 97.7 F (36.5 C)  SpO2: 98% 95%   Filed Weights   07/31/20 0015  Weight: 95.3 kg    Intake/Output from previous day: 02/21 0701 - 02/22 0700 In: 959.6 [P.O.:360; I.V.:178.8; IV Piggyback:420.8] Out: -   GENERAL:alert, no distress and comfortable SKIN: skin color, texture, turgor are normal, no rashes or significant lesions EYES: normal, Conjunctiva are pink and non-injected, sclera clear OROPHARYNX:no exudate, no erythema and lips, buccal mucosa, and tongue  normal  LUNGS: clear to auscultation and percussion with normal breathing effort HEART: regular rate & rhythm and no murmurs and no lower extremity edema ABDOMEN:abdomen soft, non-tender and normal bowel sounds Musculoskeletal:no cyanosis of digits and no clubbing  NEURO: alert & oriented x 3 with fluent speech, no focal motor/sensory deficits  LABORATORY DATA:  I have reviewed the data as listed CMP Latest Ref Rng & Units 07/31/2020 07/30/2020 07/26/2020  Glucose 70 - 99 mg/dL 124(H) 127(H) 127(H)  BUN 8 - 23 mg/dL 19 18 12   Creatinine 0.61 - 1.24 mg/dL 0.69 0.87 0.86  Sodium 135 - 145 mmol/L 138 137 140  Potassium 3.5 - 5.1 mmol/L 3.9 3.8 3.9  Chloride 98 - 111 mmol/L 106 104 108  CO2 22 - 32 mmol/L 23 24 25   Calcium 8.9 - 10.3 mg/dL 8.9 9.0 8.3(L)  Total Protein 6.5 - 8.1 g/dL 5.6(L) 6.0(L) 5.7(L)  Total Bilirubin 0.3 - 1.2 mg/dL 0.7 0.8 0.4  Alkaline Phos 38 - 126 U/L 53 62 77  AST 15 - 41 U/L 20 22 18   ALT 0 - 44 U/L 18 19 16     Lab Results  Component Value Date   WBC 8.0 07/31/2020   HGB 9.9 (L) 07/31/2020   HCT 29.9 (L) 07/31/2020   MCV 92.9 07/31/2020   PLT 126 (L) 07/31/2020   NEUTROABS 7.0 07/31/2020    NM PET Image Initial (PI) Skull Base To Thigh  Result Date: 07/04/2020 CLINICAL DATA:  Subsequent treatment strategy for B-cell lymphoma. EXAM: NUCLEAR MEDICINE PET SKULL BASE TO THIGH TECHNIQUE: 10.1 mCi F-18 FDG was injected  intravenously. Full-ring PET imaging was performed from the skull base to thigh after the radiotracer. CT data was obtained and used for attenuation correction and anatomic localization. Fasting blood glucose: Not provided mg/dl COMPARISON:  CT 04/25/2020 FINDINGS: Mediastinal blood pool activity: SUV max 1.7 Liver activity: SUV max 3.1 NECK: No hypermetabolic lymph nodes in the neck. Incidental CT findings: none CHEST: Interval reduction in size of anterior mediastinal mass measuring 2.7 x 1.4 cm compared with 6.3 x 4.0 cm. Lesion has low metabolic  activity with SUV max similar to background blood pool with SUV max equal 2.6. No suspicious pulmonary nodules. Incidental CT findings: Port in the anterior chest wall with tip in distal SVC. ABDOMEN/PELVIS: Photopenia centrally within liver associated with the hepatic mass. High-density within the mass from recent biopsy hematoma. Mass measures 6.7 x 6.4 cm which is contracted from 9.8 by 9.5 cm. No underlying hypermetabolic activity within the mass. Previously seen nodal mass at the head of the pancreas reduced in size and difficult define on noncontrast exam measuring approximately 1.7 x 1.1 cm decreased from 4.3 x 3.3 cm. This reduced nodal mass has very low metabolic activity SUV max equal 3.4 which is similar to liver activity. No hypermetabolic adenopathy in the abdomen or pelvis. Normal volume spleen with normal metabolic activity splenule anterior to the spleen. No inguinal adenopathy. Incidental CT findings: Diverticulosis of the sigmoid colon. Atherosclerotic calcification of the aorta. Prostate enlarged SKELETON: No hypermetabolic lesions in the spine. Incidental CT findings: Grade 2 anterolisthesis of L4 on L5. Probable bilateral pars defects. IMPRESSION: 1. Marked reduction in volume of anterior mediastinal mass now with low metabolic activity ( Deauville 2). 2. Reduction in volume of retroperitoneal nodal mass adjacent the pancreas with low metabolic activity ( Deauville 3). 3. No increased metabolic activity associated with the spleen 4. Interval contraction of hepatic hematoma post biopsy. No hypermetabolic elements within the hepatic lesion. 5. Grade 2 anterolisthesis L4 on L5. Electronically Signed   By: Suzy Bouchard M.D.   On: 07/04/2020 09:20   DG FLUORO GUIDED LOC OF NEEDLE/CATH TIP FOR SPINAL INJECT RT  Result Date: 07/10/2020 CLINICAL DATA:  Diffuse large B-cell lymphoma. For intrathecal chemotherapy. EXAM: DIAGNOSTIC LUMBAR PUNCTURE UNDER FLUOROSCOPIC GUIDANCE COMPARISON:  Intrathecal  injection 06/19/2020 FLUOROSCOPY TIME:  Fluoroscopy Time:  0 minutes 24 seconds Radiation Exposure Index (if provided by the fluoroscopic device): Number of Acquired Spot Images: 1 PROCEDURE: Informed consent was obtained from the patient prior to the procedure, including potential complications of headache, allergy, and pain. With the patient prone, the lower back was prepped with Betadine. 1% Lidocaine was used for local anesthesia. Lumbar puncture was performed at the L4-5 level using a 20 gauge needle with return of clear CSF. Five ml of CSF were obtained for laboratory studies. The patient tolerated the procedure well and there were no apparent complications. Methotrexate solution was injected in subarachnoid space without complication. IMPRESSION: Successful intrathecal methotrexate injections in fluoroscopic guidance. Electronically Signed   By: Franchot Gallo M.D.   On: 07/10/2020 12:02    ASSESSMENT AND PLAN: 1.  Stage IVa diffuse large B-cell lymphoma, ABC subtype, s/p 3 cycle R-EPOCH with excellent response  2.  Fatigue secondary #1 3.  Anemia secondary to recent chemotherapy 4. HTN 5. Drug induced constipation   -He is clinically doing well, tolerated day 1 of cycle 5 of his chemotherapy well overall. -Labs from this morning have been reviewed and remained stable.  Continue daily CBC with differential, CMET, uric acid. -  He is also at high risk for CNS involvement due to high risk features.  Order has been placed for intrathecal methotrexate to be administered later today. -Aspirin has been placed on hold in anticipation of intrathecal methotrexate.  Prophylactic Lovenox has not been ordered due to impending procedure.  Will reevaluate post procedure.  SCDs for DVT prophylaxis. -We will continue home bowel regimen including Miralax and Senokot. -He has Zofran and Compazine as needed for nausea and vomiting. -Continue allopurinol and other home medications. -Sodium bicarbonate mouth  rinses and Magic mouthwash PRN have been ordered. -Rituxan and Neulasta are scheduled for 08/06/2020.  Labs are scheduled for 08/09/2020 and labs and follow-up visit are scheduled for 08/17/2020.      LOS: 1 day   Mikey Bussing, DNP, AGPCNP-BC, AOCNP 07/31/20   Addendum  I have seen the patient. I agree with the assessment and and plan and have edited the notes.   Mr. Jani is doing well, did not sleep well last night, but otherwise feels well, no N/V or other side effect from chemo. Lab reviewed, adequate for treatment, will proceed with day 2 chemo. He is going to have LP and IT MTX today. Will start Lovenox for DVT prophylaxis tonight.  Truitt Merle  07/31/2020

## 2020-08-01 DIAGNOSIS — C8338 Diffuse large B-cell lymphoma, lymph nodes of multiple sites: Secondary | ICD-10-CM | POA: Diagnosis not present

## 2020-08-01 LAB — COMPREHENSIVE METABOLIC PANEL
ALT: 18 U/L (ref 0–44)
AST: 21 U/L (ref 15–41)
Albumin: 3.4 g/dL — ABNORMAL LOW (ref 3.5–5.0)
Alkaline Phosphatase: 51 U/L (ref 38–126)
Anion gap: 10 (ref 5–15)
BUN: 23 mg/dL (ref 8–23)
CO2: 23 mmol/L (ref 22–32)
Calcium: 8.6 mg/dL — ABNORMAL LOW (ref 8.9–10.3)
Chloride: 107 mmol/L (ref 98–111)
Creatinine, Ser: 0.79 mg/dL (ref 0.61–1.24)
GFR, Estimated: >60 mL/min (ref >60)
Glucose, Bld: 129 mg/dL — ABNORMAL HIGH (ref 70–99)
Potassium: 3.8 mmol/L (ref 3.5–5.1)
Sodium: 140 mmol/L (ref 135–145)
Total Bilirubin: 0.9 mg/dL (ref 0.3–1.2)
Total Protein: 5.4 g/dL — ABNORMAL LOW (ref 6.5–8.1)

## 2020-08-01 LAB — CBC WITH DIFFERENTIAL/PLATELET
Abs Immature Granulocytes: 0.06 10*3/uL (ref 0.00–0.07)
Basophils Absolute: 0 10*3/uL (ref 0.0–0.1)
Basophils Relative: 0 %
Eosinophils Absolute: 0 10*3/uL (ref 0.0–0.5)
Eosinophils Relative: 0 %
HCT: 29.6 % — ABNORMAL LOW (ref 39.0–52.0)
Hemoglobin: 9.7 g/dL — ABNORMAL LOW (ref 13.0–17.0)
Immature Granulocytes: 1 %
Lymphocytes Relative: 4 %
Lymphs Abs: 0.3 10*3/uL — ABNORMAL LOW (ref 0.7–4.0)
MCH: 30.4 pg (ref 26.0–34.0)
MCHC: 32.8 g/dL (ref 30.0–36.0)
MCV: 92.8 fL (ref 80.0–100.0)
Monocytes Absolute: 0.8 10*3/uL (ref 0.1–1.0)
Monocytes Relative: 12 %
Neutro Abs: 5.6 10*3/uL (ref 1.7–7.7)
Neutrophils Relative %: 83 %
Platelets: 105 10*3/uL — ABNORMAL LOW (ref 150–400)
RBC: 3.19 MIL/uL — ABNORMAL LOW (ref 4.22–5.81)
RDW: 16.9 % — ABNORMAL HIGH (ref 11.5–15.5)
WBC: 6.7 10*3/uL (ref 4.0–10.5)
nRBC: 0 % (ref 0.0–0.2)

## 2020-08-01 LAB — URIC ACID: Uric Acid, Serum: 6.1 mg/dL (ref 3.7–8.6)

## 2020-08-01 MED ORDER — VINCRISTINE SULFATE CHEMO INJECTION 1 MG/ML
Freq: Once | INTRAVENOUS | Status: AC
  Filled 2020-08-01: qty 8

## 2020-08-01 MED ORDER — SODIUM CHLORIDE 0.9 % IV SOLN
Freq: Once | INTRAVENOUS | Status: AC
  Administered 2020-08-01: 18 mg via INTRAVENOUS
  Filled 2020-08-01: qty 4

## 2020-08-01 NOTE — Progress Notes (Signed)
John [name]   DOB:November 12, 1945   UK#:[id number]   BJS#:[id number]  Hem/Onc follow up   Subjective: John [name] is doing very well.  He underwent LP and intrathecal chemotherapy yesterday, no headaches or any side effects from procedure.  He denies any pain, nausea, or other complaints.  He slept well last night.  He has been eating well, and ambulating in the hallway multiple times a day. VS normal    Objective:  Vitals:   07/31/20 2009 08/01/20 0452  BP: 121/67 132/71  Pulse: 85 71  Resp: 16 16  Temp: 98.1 F (36.7 C) 98.2 F (36.8 C)  SpO2: 97% 97%    Body mass index is 29.29 kg/m.  Intake/Output Summary (Last 24 hours) at 08/01/2020 1226 Last data filed at 08/01/2020 0900 Gross per 24 hour  Intake 1730.1 ml  Output --  Net 1730.1 ml     Sclerae unicteric  Oropharynx clear  No peripheral adenopathy  Lungs clear -- no rales or rhonchi  Heart regular rate and rhythm  Abdomen benign  MSK no focal spinal tenderness, no peripheral edema, no leg edema   Neuro nonfocal   CBG (last 3)  No results for input(s): GLUCAP in the last 72 hours.   Labs:  Urine Studies No results for input(s): UHGB, CRYS in the last 72 hours.  Invalid input(s): UACOL, UAPR, USPG, UPH, UTP, UGL, UKET, UBIL, UNIT, UROB, ULEU, UEPI, UWBC, URBC, UBAC, CAST, [place], [place]  Basic Metabolic Panel: Recent Labs  Lab 07/26/20 1249 07/30/20 0835 07/31/20 0449 08/01/20 0515  NA 140 137 138 140  K 3.9 3.8 3.9 3.8  CL 108 104 106 107  CO2 25 24 23 23   GLUCOSE 127* 127* 124* 129*  BUN 12 18 19 23   CREATININE 0.86 0.87 0.69 0.79  CALCIUM 8.3* 9.0 8.9 8.6*   GFR Estimated Creatinine Clearance: 98.3 mL/min (by C-G formula based on SCr of 0.79 mg/dL). Liver Function Tests: Recent Labs  Lab 07/26/20 1249 07/30/20 0835 07/31/20 0449 08/01/20 0515  AST 18 22 20 21   ALT 16 19 18 18   ALKPHOS 77 62 53 51  BILITOT 0.4 0.8 0.7 0.9  PROT 5.7* 6.0* 5.6* 5.4*  ALBUMIN 3.2* 3.6 3.4* 3.4*   No results for  input(s): LIPASE, AMYLASE in the last 168 hours. No results for input(s): AMMONIA in the last 168 hours. Coagulation profile No results for input(s): INR, PROTIME in the last 168 hours.  CBC: Recent Labs  Lab 07/26/20 1249 07/30/20 0835 07/31/20 0449 08/01/20 0515  WBC 7.7 5.3 8.0 6.7  NEUTROABS 5.8 3.9 7.0 5.6  HGB 9.6* 10.0* 9.9* 9.7*  HCT 28.8* 30.7* 29.9* 29.6*  MCV 90.9 93.3 92.9 92.8  PLT 106* 127* 126* 105*   Cardiac Enzymes: No results for input(s): CKTOTAL, CKMB, CKMBINDEX, TROPONINI in the last 168 hours. BNP: Invalid input(s): POCBNP CBG: No results for input(s): GLUCAP in the last 168 hours. D-Dimer No results for input(s): DDIMER in the last 72 hours. Hgb A1c No results for input(s): HGBA1C in the last 72 hours. Lipid Profile No results for input(s): CHOL, HDL, LDLCALC, TRIG, CHOLHDL, LDLDIRECT in the last 72 hours. Thyroid function studies No results for input(s): TSH, T4TOTAL, T3FREE, THYROIDAB in the last 72 hours.  Invalid input(s): FREET3 Anemia work up No results for input(s): VITAMINB12, FOLATE, FERRITIN, TIBC, IRON, RETICCTPCT in the last 72 hours. Microbiology Recent Results (from the past 240 hour(s))  SARS CORONAVIRUS 2 (TAT 6-24 HRS) Nasopharyngeal Nasopharyngeal Swab  Status: None   Collection Time: 07/28/20 10:28 AM   Specimen: Nasopharyngeal Swab  Result Value Ref Range Status   SARS Coronavirus 2 NEGATIVE NEGATIVE Final    Comment: (NOTE) SARS-CoV-2 target nucleic acids are NOT DETECTED.  The SARS-CoV-2 RNA is generally detectable in upper and lower respiratory specimens during the acute phase of infection. Negative results do not preclude SARS-CoV-2 infection, do not rule out co-infections with other pathogens, and should not be used as the sole basis for treatment or other patient management decisions. Negative results must be combined with clinical observations, patient history, and epidemiological information. The  expected result is Negative.  Fact Sheet for Patients: SugarRoll.be  Fact Sheet for Healthcare Providers: https://www.woods-mathews.com/  This test is not yet approved or cleared by the [place] FDA and  has been authorized for detection and/or diagnosis of SARS-CoV-2 by FDA under an Emergency Use Authorization (EUA). This EUA will remain  in effect (meaning this test can be used) for the duration of the COVID-19 declaration under Se ction 564(b)(1) of the Act, 21 U.S.C. section 360bbb-3(b)(1), unless the authorization is terminated or revoked sooner.  Performed at [facility], [place] [address] [place], [place] [address]       Studies:  DG FLUORO GUIDED LOC OF NEEDLE/CATH TIP FOR SPINAL INJECT LT  Result Date: 07/31/2020 CLINICAL DATA:  Diffuse large B-cell lymphoma. Intrathecal chemotherapy. EXAM: DIAGNOSTIC LUMBAR PUNCTURE UNDER FLUOROSCOPIC GUIDANCE FLUOROSCOPY TIME:  Fluoroscopy Time:  2 minutes Radiation Exposure Index (if provided by the fluoroscopic device): 13.9 mGy Number of Acquired Spot Images: 1 PROCEDURE: Informed consent was obtained from the patient prior to the procedure, including potential complications of headache, allergy, and pain. With the patient prone, the lower back was prepped with Betadine. 1% Lidocaine was used for local anesthesia. Lumbar puncture was performed at the L3-4 level using a 20 gauge needle with return of clear CSF with an opening pressure of 12 cm water. Five ml of CSF were initially removed. Next, Methotrexate solution was injected in subarachnoid space without Complication. The patient tolerated the procedure well and there were no apparent complications. IMPRESSION: Successful intrathecal methotrexate injections in fluoroscopic guidance. Electronically Signed   By: John [name] M.D.   On: 07/31/2020 15:47    Assessment: 75 y.o.  1. Stage IVa diffuse large B-cell lymphoma, ABC subtype,  s/p 3 cycle R-EPOCH with excellent response 2. Fatigue secondary #1 3. Anemia secondary to recent chemotherapy 4. HTN 5. Drug induced constipation     Plan:  -He underwent intrathecal chemo yesterday without any issues -Lab reviewed, adequate for treatment, will proceed to day 3 chemo today -He is doing very well overall, continue monitoring -meds reviewed, continue Lovenox for DVT prophylaxis    Truitt Merle, MD 08/01/2020  12:26 PM

## 2020-08-02 DIAGNOSIS — C8338 Diffuse large B-cell lymphoma, lymph nodes of multiple sites: Secondary | ICD-10-CM | POA: Diagnosis not present

## 2020-08-02 LAB — CBC WITH DIFFERENTIAL/PLATELET
Abs Immature Granulocytes: 0.06 10*3/uL (ref 0.00–0.07)
Basophils Absolute: 0 10*3/uL (ref 0.0–0.1)
Basophils Relative: 1 %
Eosinophils Absolute: 0 10*3/uL (ref 0.0–0.5)
Eosinophils Relative: 0 %
HCT: 29 % — ABNORMAL LOW (ref 39.0–52.0)
Hemoglobin: 9.6 g/dL — ABNORMAL LOW (ref 13.0–17.0)
Immature Granulocytes: 1 %
Lymphocytes Relative: 5 %
Lymphs Abs: 0.3 10*3/uL — ABNORMAL LOW (ref 0.7–4.0)
MCH: 30.7 pg (ref 26.0–34.0)
MCHC: 33.1 g/dL (ref 30.0–36.0)
MCV: 92.7 fL (ref 80.0–100.0)
Monocytes Absolute: 0.6 10*3/uL (ref 0.1–1.0)
Monocytes Relative: 12 %
Neutro Abs: 4.1 10*3/uL (ref 1.7–7.7)
Neutrophils Relative %: 81 %
Platelets: 95 10*3/uL — ABNORMAL LOW (ref 150–400)
RBC: 3.13 MIL/uL — ABNORMAL LOW (ref 4.22–5.81)
RDW: 16.9 % — ABNORMAL HIGH (ref 11.5–15.5)
WBC: 5 10*3/uL (ref 4.0–10.5)
nRBC: 0 % (ref 0.0–0.2)

## 2020-08-02 LAB — COMPREHENSIVE METABOLIC PANEL
ALT: 23 U/L (ref 0–44)
AST: 26 U/L (ref 15–41)
Albumin: 3.2 g/dL — ABNORMAL LOW (ref 3.5–5.0)
Alkaline Phosphatase: 52 U/L (ref 38–126)
Anion gap: 9 (ref 5–15)
BUN: 24 mg/dL — ABNORMAL HIGH (ref 8–23)
CO2: 23 mmol/L (ref 22–32)
Calcium: 8.6 mg/dL — ABNORMAL LOW (ref 8.9–10.3)
Chloride: 108 mmol/L (ref 98–111)
Creatinine, Ser: 0.78 mg/dL (ref 0.61–1.24)
GFR, Estimated: >60 mL/min (ref >60)
Glucose, Bld: 103 mg/dL — ABNORMAL HIGH (ref 70–99)
Potassium: 3.7 mmol/L (ref 3.5–5.1)
Sodium: 140 mmol/L (ref 135–145)
Total Bilirubin: 0.9 mg/dL (ref 0.3–1.2)
Total Protein: 5.4 g/dL — ABNORMAL LOW (ref 6.5–8.1)

## 2020-08-02 LAB — URIC ACID: Uric Acid, Serum: 6.5 mg/dL (ref 3.7–8.6)

## 2020-08-02 MED ORDER — MAGIC MOUTHWASH W/LIDOCAINE: 15.0000 mL | Freq: Four times a day (QID) | ORAL | 0 refills | Status: DC | PRN

## 2020-08-02 MED ORDER — SIMETHICONE 80 MG PO CHEW: 80.0000 mg | CHEWABLE_TABLET | Freq: Four times a day (QID) | ORAL | 0 refills | Status: DC | PRN

## 2020-08-02 MED ORDER — ETOPOSIDE CHEMO INJECTION 500 MG/25ML
Freq: Once | INTRAVENOUS | Status: AC
  Filled 2020-08-02: qty 8

## 2020-08-02 MED ORDER — SODIUM BICARBONATE/SODIUM CHLORIDE MOUTHWASH: 1.0000 "application " | OROMUCOSAL | Status: DC | PRN

## 2020-08-02 MED ORDER — SODIUM CHLORIDE 0.9 % IV SOLN
Freq: Once | INTRAVENOUS | Status: AC
  Administered 2020-08-02: 18 mg via INTRAVENOUS
  Filled 2020-08-02: qty 4

## 2020-08-02 MED ORDER — SODIUM CHLORIDE 0.9 % IV SOLN
Freq: Once | INTRAVENOUS | Status: AC
  Administered 2020-08-03: 36 mg via INTRAVENOUS
  Filled 2020-08-02: qty 8

## 2020-08-02 MED ORDER — SODIUM CHLORIDE 0.9 % IV SOLN
600.0000 mg/m2 | Freq: Once | INTRAVENOUS | Status: AC
  Administered 2020-08-03: 1280 mg via INTRAVENOUS
  Filled 2020-08-02: qty 64

## 2020-08-02 NOTE — Progress Notes (Signed)
Ok to proceed w/ tx today w/ Pltc = 95 per Dr. Burr Medico.  Kennith Center, Pharm.D., CPP 08/02/2020@8 :45 AM

## 2020-08-02 NOTE — Progress Notes (Addendum)
John [name]   DOB:September 14, 1945   SE#:[id number]   WVP#:[id number]  Hem/Onc follow up   Subjective: John [name] is doing very well. This morning, he denies mucositis, abdominal pain, nausea, vomiting. Bowels are moving more slowly but he does not feel constipated. He does not want anything additional for constipation. No bleeding reported. Denies headaches and dizziness. He is ambulating in the hallway times per day. Vital signs stable.  Objective:  Vitals:   08/01/20 2050 08/02/20 0516  BP: (!) 141/70 125/72  Pulse: 90 65  Resp: 16 14  Temp: 98.1 F (36.7 C) 97.6 F (36.4 C)  SpO2: 99% 96%    Body mass index is 29.29 kg/m.  Intake/Output Summary (Last 24 hours) at 08/02/2020 0917 Last data filed at 08/01/2020 1806 Gross per 24 hour  Intake 1150.9 ml  Output -  Net 1150.9 ml     Sclerae unicteric  Oropharynx clear  No peripheral adenopathy  Lungs clear -- no rales or rhonchi  Heart regular rate and rhythm  Abdomen benign  MSK no focal spinal tenderness, no peripheral edema, no leg edema   Neuro nonfocal   CBG (last 3)  No results for input(s): GLUCAP in the last 72 hours.   Labs:  Urine Studies No results for input(s): UHGB, CRYS in the last 72 hours.  Invalid input(s): UACOL, UAPR, USPG, UPH, UTP, UGL, UKET, UBIL, UNIT, UROB, [place], UEPI, UWBC, Duwayne Heck [place], [place]  Basic Metabolic Panel: Recent Labs  Lab 07/26/20 1249 07/30/20 0835 07/31/20 0449 08/01/20 0515 08/02/20 0515  NA 140 137 138 140 140  K 3.9 3.8 3.9 3.8 3.7  CL 108 104 106 107 108  CO2 25 24 23 23 23   GLUCOSE 127* 127* 124* 129* 103*  BUN 12 18 19 23  24*  CREATININE 0.86 0.87 0.69 0.79 0.78  CALCIUM 8.3* 9.0 8.9 8.6* 8.6*   GFR Estimated Creatinine Clearance: 98.3 mL/min (by C-G formula based on SCr of 0.78 mg/dL). Liver Function Tests: Recent Labs  Lab 07/26/20 1249 07/30/20 0835 07/31/20 0449 08/01/20 0515 08/02/20 0515  AST 18 22 20 21 26   ALT 16 19 18 18 23   ALKPHOS 77 62  53 51 52  BILITOT 0.4 0.8 0.7 0.9 0.9  PROT 5.7* 6.0* 5.6* 5.4* 5.4*  ALBUMIN 3.2* 3.6 3.4* 3.4* 3.2*   No results for input(s): LIPASE, AMYLASE in the last 168 hours. No results for input(s): AMMONIA in the last 168 hours. Coagulation profile No results for input(s): INR, PROTIME in the last 168 hours.  CBC: Recent Labs  Lab 07/26/20 1249 07/30/20 0835 07/31/20 0449 08/01/20 0515 08/02/20 0515  WBC 7.7 5.3 8.0 6.7 5.0  NEUTROABS 5.8 3.9 7.0 5.6 4.1  HGB 9.6* 10.0* 9.9* 9.7* 9.6*  HCT 28.8* 30.7* 29.9* 29.6* 29.0*  MCV 90.9 93.3 92.9 92.8 92.7  PLT 106* 127* 126* 105* 95*   Cardiac Enzymes: No results for input(s): CKTOTAL, CKMB, CKMBINDEX, TROPONINI in the last 168 hours. BNP: Invalid input(s): POCBNP CBG: No results for input(s): GLUCAP in the last 168 hours. D-Dimer No results for input(s): DDIMER in the last 72 hours. Hgb A1c No results for input(s): HGBA1C in the last 72 hours. Lipid Profile No results for input(s): CHOL, HDL, LDLCALC, TRIG, CHOLHDL, LDLDIRECT in the last 72 hours. Thyroid function studies No results for input(s): TSH, T4TOTAL, T3FREE, THYROIDAB in the last 72 hours.  Invalid input(s): FREET3 Anemia work up No results for input(s): VITAMINB12, FOLATE, FERRITIN, TIBC, IRON, RETICCTPCT in the  last 72 hours. Microbiology Recent Results (from the past 240 hour(s))  SARS CORONAVIRUS 2 (TAT 6-24 HRS) Nasopharyngeal Nasopharyngeal Swab     Status: None   Collection Time: 07/28/20 10:28 AM   Specimen: Nasopharyngeal Swab  Result Value Ref Range Status   SARS Coronavirus 2 NEGATIVE NEGATIVE Final    Comment: (NOTE) SARS-CoV-2 target nucleic acids are NOT DETECTED.  The SARS-CoV-2 RNA is generally detectable in upper and lower respiratory specimens during the acute phase of infection. Negative results do not preclude SARS-CoV-2 infection, do not rule out co-infections with other pathogens, and should not be used as the sole basis for treatment or  other patient management decisions. Negative results must be combined with clinical observations, patient history, and epidemiological information. The expected result is Negative.  Fact Sheet for Patients: SugarRoll.be  Fact Sheet for Healthcare Providers: https://www.woods-mathews.com/  This test is not yet approved or cleared by the [place] FDA and  has been authorized for detection and/or diagnosis of SARS-CoV-2 by FDA under an Emergency Use Authorization (EUA). This EUA will remain  in effect (meaning this test can be used) for the duration of the COVID-19 declaration under Se ction 564(b)(1) of the Act, 21 U.S.C. section 360bbb-3(b)(1), unless the authorization is terminated or revoked sooner.  Performed at [facility], [place] [address] [place], [place] [address]       Studies:  DG FLUORO GUIDED LOC OF NEEDLE/CATH TIP FOR SPINAL INJECT LT  Result Date: 07/31/2020 CLINICAL DATA:  Diffuse large B-cell lymphoma. Intrathecal chemotherapy. EXAM: DIAGNOSTIC LUMBAR PUNCTURE UNDER FLUOROSCOPIC GUIDANCE FLUOROSCOPY TIME:  Fluoroscopy Time:  2 minutes Radiation Exposure Index (if provided by the fluoroscopic device): 13.9 mGy Number of Acquired Spot Images: 1 PROCEDURE: Informed consent was obtained from the patient prior to the procedure, including potential complications of headache, allergy, and pain. With the patient prone, the lower back was prepped with Betadine. 1% Lidocaine was used for local anesthesia. Lumbar puncture was performed at the L3-4 level using a 20 gauge needle with return of clear CSF with an opening pressure of 12 cm water. Five ml of CSF were initially removed. Next, Methotrexate solution was injected in subarachnoid space without Complication. The patient tolerated the procedure well and there were no apparent complications. IMPRESSION: Successful intrathecal methotrexate injections in fluoroscopic guidance.  Electronically Signed   By: Kerby Moors M.D.   On: 07/31/2020 15:47    Assessment: 75 y.o.  1. Stage IVa diffuse large B-cell lymphoma, ABC subtype, s/p 3 cycle R-EPOCH with excellent response 2. Fatigue secondary #1 3. Anemia secondary to recent chemotherapy 4. HTN 5. Drug induced constipation   -He is clinically doing well, tolerated day 4 of cycle 5 of his chemotherapy well overall. -Labs from this morning have been reviewed. He has developed thrombocytopenia but will continue to treat despite platelet count of 95,000. His WBC and hemoglobin remained stable. Chemistry stable. Continue daily CBC with differential, CMET, uric acid. -He is also at high risk for CNS involvement due to high risk features.   Status post intrathecal cetraxate 07/31/2020. Tolerated procedure well. -Aspirin has been placed on hold in anticipation of intrathecal methotrexate. He has been started on prophylactic Lovenox following intrathecal methotrexate. -We will continue home bowel regimen includingMiralaxand Senokot. -He has Zofran and Compazine as needed for nausea and vomiting. -Continue allopurinol and other home medications. -Sodium bicarbonate mouth rinses and Magic mouthwash PRN have been ordered. -Anticipate hospital discharge 08/03/2020. -Rituxan and Neulasta are scheduled for 08/06/2020.  Labs are scheduled for 08/09/2020 and labs and follow-up visit are scheduled for 08/17/2020.    Mikey Bussing, NP 08/02/2020  9:17 AM   Addendum  John [name] is doing well. Lab reviewed, plt slightly low, adequate for treatment, will continue chemo today. Plan to discharge home tomorrow after he completes chemo.  Truitt Merle  08/02/2020

## 2020-08-02 NOTE — Care Management Important Message (Signed)
Important Message  Patient Details IM Letter given to the Patient. Name: John [name] MRN: [id number] Date of Birth: 09-10-1945   Medicare Important Message Given:  Yes     Kerin Salen 08/02/2020, 9:46 AM

## 2020-08-03 ENCOUNTER — Telehealth: Payer: Self-pay | Admitting: Hematology

## 2020-08-03 DIAGNOSIS — C8338 Diffuse large B-cell lymphoma, lymph nodes of multiple sites: Secondary | ICD-10-CM | POA: Diagnosis not present

## 2020-08-03 LAB — COMPREHENSIVE METABOLIC PANEL
ALT: 30 U/L (ref 0–44)
AST: 29 U/L (ref 15–41)
Albumin: 3.2 g/dL — ABNORMAL LOW (ref 3.5–5.0)
Alkaline Phosphatase: 50 U/L (ref 38–126)
Anion gap: 5 (ref 5–15)
BUN: 25 mg/dL — ABNORMAL HIGH (ref 8–23)
CO2: 24 mmol/L (ref 22–32)
Calcium: 8.4 mg/dL — ABNORMAL LOW (ref 8.9–10.3)
Chloride: 110 mmol/L (ref 98–111)
Creatinine, Ser: 0.73 mg/dL (ref 0.61–1.24)
GFR, Estimated: >60 mL/min (ref >60)
Glucose, Bld: 93 mg/dL (ref 70–99)
Potassium: 3.4 mmol/L — ABNORMAL LOW (ref 3.5–5.1)
Sodium: 139 mmol/L (ref 135–145)
Total Bilirubin: 0.9 mg/dL (ref 0.3–1.2)
Total Protein: 5.4 g/dL — ABNORMAL LOW (ref 6.5–8.1)

## 2020-08-03 LAB — CBC WITH DIFFERENTIAL/PLATELET
Abs Immature Granulocytes: 0.09 10*3/uL — ABNORMAL HIGH (ref 0.00–0.07)
Basophils Absolute: 0 10*3/uL (ref 0.0–0.1)
Basophils Relative: 0 %
Eosinophils Absolute: 0 10*3/uL (ref 0.0–0.5)
Eosinophils Relative: 0 %
HCT: 26.6 % — ABNORMAL LOW (ref 39.0–52.0)
Hemoglobin: 9 g/dL — ABNORMAL LOW (ref 13.0–17.0)
Immature Granulocytes: 3 %
Lymphocytes Relative: 8 %
Lymphs Abs: 0.3 10*3/uL — ABNORMAL LOW (ref 0.7–4.0)
MCH: 30.9 pg (ref 26.0–34.0)
MCHC: 33.8 g/dL (ref 30.0–36.0)
MCV: 91.4 fL (ref 80.0–100.0)
Monocytes Absolute: 0.3 10*3/uL (ref 0.1–1.0)
Monocytes Relative: 7 %
Neutro Abs: 2.8 10*3/uL (ref 1.7–7.7)
Neutrophils Relative %: 82 %
Platelets: 80 10*3/uL — ABNORMAL LOW (ref 150–400)
RBC: 2.91 MIL/uL — ABNORMAL LOW (ref 4.22–5.81)
RDW: 16.3 % — ABNORMAL HIGH (ref 11.5–15.5)
WBC: 3.4 10*3/uL — ABNORMAL LOW (ref 4.0–10.5)
nRBC: 0 % (ref 0.0–0.2)

## 2020-08-03 LAB — URIC ACID: Uric Acid, Serum: 6.6 mg/dL (ref 3.7–8.6)

## 2020-08-03 MED ORDER — POTASSIUM CHLORIDE 20 MEQ PO PACK
20.0000 meq | PACK | Freq: Once | ORAL | Status: AC
  Administered 2020-08-03: 20 meq via ORAL
  Filled 2020-08-03: qty 1

## 2020-08-03 MED ORDER — HEPARIN SOD (PORK) LOCK FLUSH 100 UNIT/ML IV SOLN
500.0000 [IU] | INTRAVENOUS | Status: DC | PRN
  Filled 2020-08-03: qty 5

## 2020-08-03 MED ORDER — HEPARIN SOD (PORK) LOCK FLUSH 100 UNIT/ML IV SOLN: 500.0000 [IU] | INTRAVENOUS | Status: DC

## 2020-08-03 NOTE — Discharge Summary (Addendum)
Discharge Summary  Patient ID: John [name] MRN: [id number] DOB/AGE: 1945/12/22 75 y.o.  Admit date: 07/30/2020 Discharge date: 08/03/2020  Discharge Diagnoses:  Active Problems:   Diffuse large B cell lymphoma (HCC)   Encounter for antineoplastic chemotherapy   Diffuse large B-cell lymphoma of lymph nodes of multiple sites [facility])   Discharged Condition: good  Discharge Labs:   CBC    Component Value Date/Time   WBC 3.4 (L) 08/03/2020 0516   RBC 2.91 (L) 08/03/2020 0516   HGB 9.0 (L) 08/03/2020 0516   HGB 9.6 (L) 07/26/2020 1249   HCT 26.6 (L) 08/03/2020 0516   PLT 80 (L) 08/03/2020 0516   PLT 106 (L) 07/26/2020 1249   MCV 91.4 08/03/2020 0516   MCH 30.9 08/03/2020 0516   MCHC 33.8 08/03/2020 0516   RDW 16.3 (H) 08/03/2020 0516   LYMPHSABS 0.3 (L) 08/03/2020 0516   MONOABS 0.3 08/03/2020 0516   EOSABS 0.0 08/03/2020 0516   BASOSABS 0.0 08/03/2020 0516   CMP Latest Ref Rng & Units 08/03/2020 08/02/2020 08/01/2020  Glucose 70 - 99 mg/dL 93 103(H) 129(H)  BUN 8 - 23 mg/dL 25(H) 24(H) 23  Creatinine 0.61 - 1.24 mg/dL 0.73 0.78 0.79  Sodium 135 - 145 mmol/L 139 140 140  Potassium 3.5 - 5.1 mmol/L 3.4(L) 3.7 3.8  Chloride 98 - 111 mmol/L 110 108 107  CO2 22 - 32 mmol/L 24 23 23   Calcium 8.9 - 10.3 mg/dL 8.4(L) 8.6(L) 8.6(L)  Total Protein 6.5 - 8.1 g/dL 5.4(L) 5.4(L) 5.4(L)  Total Bilirubin 0.3 - 1.2 mg/dL 0.9 0.9 0.9  Alkaline Phos 38 - 126 U/L 50 52 51  AST 15 - 41 U/L 29 26 21   ALT 0 - 44 U/L 30 23 18     Consults: None  Procedures: 07/31/2020- fluoroscopic guided lumbar puncture and instillation of intrathecal methotrexate  Disposition:  Discharge disposition: 01-Home or Self Care      Allergies as of 08/03/2020   No Known Allergies     Medication List    TAKE these medications   allopurinol 100 MG tablet Commonly known as: ZYLOPRIM TAKE 1 TABLET(100 MG) BY MOUTH DAILY What changed: See the new instructions.   aspirin EC 81 MG tablet Take 81 mg by  mouth daily.   BOOST MAX PROTEIN PO Take 237 mLs by mouth daily. Chocolate   doxazosin 4 MG tablet Commonly known as: CARDURA Take 4 mg by mouth at bedtime.   lidocaine-prilocaine cream Commonly known as: EMLA Apply 1 application topically as needed. What changed:   when to take this  reasons to take this   magic mouthwash w/lidocaine Soln Take 15 mLs by mouth 4 (four) times daily as needed for mouth pain.   melatonin 3 MG Tabs tablet Take 2 tablets (6 mg total) by mouth at bedtime as needed (sleep). What changed: when to take this   metoprolol tartrate 25 MG tablet Commonly known as: LOPRESSOR Take 0.5 tablets (12.5 mg total) by mouth 2 (two) times daily. What changed: when to take this   MULTIVITAMIN PO Take 1 tablet by mouth daily.   OMEGA 3 PO Take 1 tablet by mouth daily.   ondansetron 8 MG tablet Commonly known as: ZOFRAN Take 1 tablet (8 mg total) by mouth every 8 (eight) hours as needed for nausea or vomiting.   pantoprazole 40 MG tablet Commonly known as: Protonix Take 1 tablet (40 mg total) by mouth daily.   polyethylene glycol 17 g packet Commonly known as:  MIRALAX / GLYCOLAX Take 17 g by mouth daily as needed for mild constipation.   prochlorperazine 10 MG tablet Commonly known as: COMPAZINE Take 1 tablet (10 mg total) by mouth every 6 (six) hours as needed for nausea or vomiting.   senna-docusate 8.6-50 MG tablet Commonly known as: Senokot-S Take 2 tablets by mouth 2 (two) times daily as needed for mild constipation. What changed:   how much to take  when to take this   simethicone 80 MG chewable tablet Commonly known as: MYLICON Chew 1 tablet (80 mg total) by mouth 4 (four) times daily as needed for flatulence.   sodium bicarbonate/sodium chloride Soln 1 application by Mouth Rinse route as needed for dry mouth or mouth pain.   vitamin C with rose hips 1000 MG tablet Take 1,000 mg by mouth daily.        HPI:  John [name] a  75year old Caucasian male, with past medical history of hypertension, BPH.  He was [facility] in November 2021 due to fatigue and constipation.  He was found to have severe hypercalcemia and CT scan showed a large mediastinal and liver mass with diffuse retroperitoneal lymphadenopathy.  He received IV fluids and pamidronate for treatment of hypercalcemia with resolution.  He also had hyperuricemia and was treated with rasburicase and then placed on allopurinol.  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  The patient was discharged home with a course of prednisone x5 days. Once biopsy resulted, it was recommended him to begin inpatient EPOCH-R for treatment of his lymphoma.    The patient was seen on the morning of admission and was clinically stable.  He was not having any issues with pain, nausea, fever, chills, bleeding, or other discomfort.  Constipation was well controlled with medication.  He had mild fatigue but able to function well at home.  Denied peripheral neuropathy. He was seen on the morning of admission prior to beginning cycle #5 of EPOCH-R.  Hospital Course:  John [name] was admitted on 07/30/2020 and chemotherapy was started as planned on day 1.  The patient overall tolerated his chemotherapy well this admission.  He had no significant side effects such as mucositis, nausea, vomiting.  He had no difficulty with constipation this admission.  The patient received intrathecal methotrexate on 07/31/2020 and tolerated the procedure well.    The patient was seen on the morning of 08/03/2020 and was feeling well overall.  His exam remained unchanged.  He had mild hypokalemia and was given a dose of K-Dur 20 mEq prior to discharge.  He has developed mild thrombocytopenia without any evidence of bleeding.  We will continue to follow his platelets very closely as an  outpatient.  Rituxan and Neulasta are scheduled for 08/06/2020. Labs are scheduled for 08/09/2020 and labs and follow-up visit are scheduled for3/04/2021.  Discharge Instructions    Activity as tolerated - No restrictions   Complete by: As directed    Diet general   Complete by: As directed       Signed: Mikey Bussing 08/03/2020, 7:58 AM   Addendum  I have seen the patient, examined him. I agree with the assessment and and plan and have edited the notes.   Pt tolerated the cycle 5 chemo very well, plt slightly low on discharge, will repeat on Monday 2/28 when he comes in to my office for Rituxan and GCSF, to  see if he needs blood transfusion. Medication reviewed with pt, he denies any need for refills.   Truitt Merle  08/03/2020

## 2020-08-03 NOTE — Progress Notes (Signed)
Pt discharged home with wife in stable condition. Discharge instructions given. Pt verbalized understanding. No immediate questions or concerns at this time. Discharged from unit via wheelchair.

## 2020-08-03 NOTE — Telephone Encounter (Signed)
Scheduled appt per 2/25 sch msg - no answer. Left message for pt with appt date and time

## 2020-08-06 ENCOUNTER — Other Ambulatory Visit: Payer: Self-pay

## 2020-08-06 ENCOUNTER — Inpatient Hospital Stay: Payer: Medicare HMO

## 2020-08-06 ENCOUNTER — Other Ambulatory Visit: Payer: Self-pay | Admitting: Adult Health

## 2020-08-06 VITALS — BP 121/52 | HR 91 | Temp 98.2°F | Resp 18 | Ht 71.0 in | Wt 208.1 lb

## 2020-08-06 DIAGNOSIS — D61810 Antineoplastic chemotherapy induced pancytopenia: Secondary | ICD-10-CM | POA: Diagnosis not present

## 2020-08-06 DIAGNOSIS — C8339 Diffuse large B-cell lymphoma, extranodal and solid organ sites: Secondary | ICD-10-CM | POA: Diagnosis present

## 2020-08-06 DIAGNOSIS — D649 Anemia, unspecified: Secondary | ICD-10-CM | POA: Diagnosis not present

## 2020-08-06 DIAGNOSIS — Z5112 Encounter for antineoplastic immunotherapy: Secondary | ICD-10-CM | POA: Diagnosis present

## 2020-08-06 DIAGNOSIS — N179 Acute kidney failure, unspecified: Secondary | ICD-10-CM | POA: Diagnosis not present

## 2020-08-06 DIAGNOSIS — R5383 Other fatigue: Secondary | ICD-10-CM | POA: Diagnosis not present

## 2020-08-06 DIAGNOSIS — C8330 Diffuse large B-cell lymphoma, unspecified site: Secondary | ICD-10-CM

## 2020-08-06 DIAGNOSIS — Z9221 Personal history of antineoplastic chemotherapy: Secondary | ICD-10-CM | POA: Diagnosis not present

## 2020-08-06 DIAGNOSIS — Z5189 Encounter for other specified aftercare: Secondary | ICD-10-CM | POA: Diagnosis not present

## 2020-08-06 DIAGNOSIS — Z298 Encounter for other specified prophylactic measures: Secondary | ICD-10-CM | POA: Diagnosis present

## 2020-08-06 DIAGNOSIS — R634 Abnormal weight loss: Secondary | ICD-10-CM | POA: Diagnosis not present

## 2020-08-06 LAB — CMP (CANCER CENTER ONLY)
ALT: 21 U/L (ref 0–44)
AST: 14 U/L — ABNORMAL LOW (ref 15–41)
Albumin: 3.3 g/dL — ABNORMAL LOW (ref 3.5–5.0)
Alkaline Phosphatase: 50 U/L (ref 38–126)
Anion gap: 7 (ref 5–15)
BUN: 22 mg/dL (ref 8–23)
CO2: 23 mmol/L (ref 22–32)
Calcium: 9.2 mg/dL (ref 8.9–10.3)
Chloride: 105 mmol/L (ref 98–111)
Creatinine: 0.83 mg/dL (ref 0.61–1.24)
GFR, Estimated: >60 mL/min (ref >60)
Glucose, Bld: 154 mg/dL — ABNORMAL HIGH (ref 70–99)
Potassium: 3.7 mmol/L (ref 3.5–5.1)
Sodium: 135 mmol/L (ref 135–145)
Total Bilirubin: 0.6 mg/dL (ref 0.3–1.2)
Total Protein: 5.6 g/dL — ABNORMAL LOW (ref 6.5–8.1)

## 2020-08-06 LAB — CBC WITH DIFFERENTIAL (CANCER CENTER ONLY)
Abs Immature Granulocytes: 0.05 10*3/uL (ref 0.00–0.07)
Basophils Absolute: 0 10*3/uL (ref 0.0–0.1)
Basophils Relative: 1 %
Eosinophils Absolute: 0.1 10*3/uL (ref 0.0–0.5)
Eosinophils Relative: 2 %
HCT: 28.6 % — ABNORMAL LOW (ref 39.0–52.0)
Hemoglobin: 9.6 g/dL — ABNORMAL LOW (ref 13.0–17.0)
Immature Granulocytes: 2 %
Lymphocytes Relative: 3 %
Lymphs Abs: 0.1 10*3/uL — ABNORMAL LOW (ref 0.7–4.0)
MCH: 30.1 pg (ref 26.0–34.0)
MCHC: 33.6 g/dL (ref 30.0–36.0)
MCV: 89.7 fL (ref 80.0–100.0)
Monocytes Absolute: 0 10*3/uL — ABNORMAL LOW (ref 0.1–1.0)
Monocytes Relative: 1 %
Neutro Abs: 2.9 10*3/uL (ref 1.7–7.7)
Neutrophils Relative %: 91 %
Platelet Count: 64 10*3/uL — ABNORMAL LOW (ref 150–400)
RBC: 3.19 MIL/uL — ABNORMAL LOW (ref 4.22–5.81)
RDW: 16.1 % — ABNORMAL HIGH (ref 11.5–15.5)
WBC Count: 3.2 10*3/uL — ABNORMAL LOW (ref 4.0–10.5)
nRBC: 0 % (ref 0.0–0.2)

## 2020-08-06 LAB — LACTATE DEHYDROGENASE: LDH: 216 U/L — ABNORMAL HIGH (ref 98–192)

## 2020-08-06 LAB — URIC ACID: Uric Acid, Serum: 5.7 mg/dL (ref 3.7–8.6)

## 2020-08-06 MED ORDER — ACETAMINOPHEN 325 MG PO TABS
ORAL_TABLET | ORAL | Status: AC
  Filled 2020-08-06: qty 2

## 2020-08-06 MED ORDER — DIPHENHYDRAMINE HCL 25 MG PO CAPS
50.0000 mg | ORAL_CAPSULE | Freq: Once | ORAL | Status: AC
  Administered 2020-08-06: 50 mg via ORAL

## 2020-08-06 MED ORDER — PEGFILGRASTIM-CBQV 6 MG/0.6ML ~~LOC~~ SOSY
6.0000 mg | PREFILLED_SYRINGE | Freq: Once | SUBCUTANEOUS | Status: AC
  Administered 2020-08-06: 6 mg via SUBCUTANEOUS

## 2020-08-06 MED ORDER — SODIUM CHLORIDE 0.9% FLUSH
10.0000 mL | INTRAVENOUS | Status: DC | PRN
  Administered 2020-08-06: 10 mL
  Filled 2020-08-06: qty 10

## 2020-08-06 MED ORDER — PEGFILGRASTIM-CBQV 6 MG/0.6ML ~~LOC~~ SOSY
PREFILLED_SYRINGE | SUBCUTANEOUS | Status: AC
  Filled 2020-08-06: qty 0.6

## 2020-08-06 MED ORDER — SODIUM CHLORIDE 0.9 % IV SOLN
375.0000 mg/m2 | Freq: Once | INTRAVENOUS | Status: AC
  Administered 2020-08-06: 800 mg via INTRAVENOUS
  Filled 2020-08-06: qty 50

## 2020-08-06 MED ORDER — ACETAMINOPHEN 325 MG PO TABS
650.0000 mg | ORAL_TABLET | Freq: Once | ORAL | Status: AC
  Administered 2020-08-06: 650 mg via ORAL

## 2020-08-06 MED ORDER — HEPARIN SOD (PORK) LOCK FLUSH 100 UNIT/ML IV SOLN
500.0000 [IU] | Freq: Once | INTRAVENOUS | Status: AC | PRN
  Administered 2020-08-06: 500 [IU]
  Filled 2020-08-06: qty 5

## 2020-08-06 MED ORDER — DIPHENHYDRAMINE HCL 25 MG PO CAPS
ORAL_CAPSULE | ORAL | Status: AC
  Filled 2020-08-06: qty 2

## 2020-08-06 MED ORDER — SODIUM CHLORIDE 0.9 % IV SOLN
INTRAVENOUS | Status: DC
  Filled 2020-08-06: qty 250

## 2020-08-06 NOTE — Progress Notes (Signed)
I reviewed with [place] Breed that we received updated FDA guidance that all patients who previously received Tixagevimab150mg /Cilgavimab150mg  should receive another 150mg  dose to equal the new updated dose of 300mg Tixagevimab/300mg  Cilgavimab.  Patient verbalized understanding and agreed to updated dose.  They will receive this dose on 08/17/2020.  Orders placed.  Wilber Bihari

## 2020-08-06 NOTE — Progress Notes (Signed)
Per Dr. Burr Medico - okay to treat with elevated heart rate and platelet level of 64.

## 2020-08-06 NOTE — Patient Instructions (Signed)
[facility] Discharge Instructions for Patients Receiving Chemotherapy  Today you received the following chemotherapy agents Rituximab (Rituxan).  To help prevent nausea and vomiting after your treatment, we encourage you to take your nausea medication as prescribed.   If you develop nausea and vomiting that is not controlled by your nausea medication, call the clinic.   BELOW ARE SYMPTOMS THAT SHOULD BE REPORTED IMMEDIATELY:  *FEVER GREATER THAN 100.5 F  *CHILLS WITH OR WITHOUT FEVER  NAUSEA AND VOMITING THAT IS NOT CONTROLLED WITH YOUR NAUSEA MEDICATION  *UNUSUAL SHORTNESS OF BREATH  *UNUSUAL BRUISING OR BLEEDING  TENDERNESS IN MOUTH AND THROAT WITH OR WITHOUT PRESENCE OF ULCERS  *URINARY PROBLEMS  *BOWEL PROBLEMS  UNUSUAL RASH Items with * indicate a potential emergency and should be followed up as soon as possible.  Feel free to call the clinic should you have any questions or concerns. The clinic phone number is [phone].  Please show the CHEMO ALERT CARD at check-in to the Emergency Department and triage nurse.   

## 2020-08-09 ENCOUNTER — Other Ambulatory Visit: Payer: Self-pay

## 2020-08-09 ENCOUNTER — Inpatient Hospital Stay: Payer: Medicare HMO | Attending: Hematology

## 2020-08-09 ENCOUNTER — Telehealth: Payer: Self-pay

## 2020-08-09 ENCOUNTER — Inpatient Hospital Stay: Payer: Medicare HMO

## 2020-08-09 DIAGNOSIS — Z452 Encounter for adjustment and management of vascular access device: Secondary | ICD-10-CM | POA: Insufficient documentation

## 2020-08-09 DIAGNOSIS — D61810 Antineoplastic chemotherapy induced pancytopenia: Secondary | ICD-10-CM | POA: Diagnosis not present

## 2020-08-09 DIAGNOSIS — C8339 Diffuse large B-cell lymphoma, extranodal and solid organ sites: Secondary | ICD-10-CM

## 2020-08-09 DIAGNOSIS — D649 Anemia, unspecified: Secondary | ICD-10-CM | POA: Insufficient documentation

## 2020-08-09 DIAGNOSIS — N179 Acute kidney failure, unspecified: Secondary | ICD-10-CM | POA: Diagnosis not present

## 2020-08-09 DIAGNOSIS — Z298 Encounter for other specified prophylactic measures: Secondary | ICD-10-CM | POA: Diagnosis present

## 2020-08-09 DIAGNOSIS — R634 Abnormal weight loss: Secondary | ICD-10-CM | POA: Diagnosis not present

## 2020-08-09 DIAGNOSIS — Z9221 Personal history of antineoplastic chemotherapy: Secondary | ICD-10-CM | POA: Insufficient documentation

## 2020-08-09 DIAGNOSIS — Z95828 Presence of other vascular implants and grafts: Secondary | ICD-10-CM

## 2020-08-09 DIAGNOSIS — R5383 Other fatigue: Secondary | ICD-10-CM | POA: Diagnosis not present

## 2020-08-09 LAB — CMP (CANCER CENTER ONLY)
ALT: 19 U/L (ref 0–44)
AST: 15 U/L (ref 15–41)
Albumin: 3.4 g/dL — ABNORMAL LOW (ref 3.5–5.0)
Alkaline Phosphatase: 70 U/L (ref 38–126)
Anion gap: 9 (ref 5–15)
BUN: 19 mg/dL (ref 8–23)
CO2: 22 mmol/L (ref 22–32)
Calcium: 8.9 mg/dL (ref 8.9–10.3)
Chloride: 105 mmol/L (ref 98–111)
Creatinine: 0.83 mg/dL (ref 0.61–1.24)
GFR, Estimated: >60 mL/min (ref >60)
Glucose, Bld: 151 mg/dL — ABNORMAL HIGH (ref 70–99)
Potassium: 3.8 mmol/L (ref 3.5–5.1)
Sodium: 136 mmol/L (ref 135–145)
Total Bilirubin: 0.7 mg/dL (ref 0.3–1.2)
Total Protein: 5.8 g/dL — ABNORMAL LOW (ref 6.5–8.1)

## 2020-08-09 LAB — CBC WITH DIFFERENTIAL (CANCER CENTER ONLY)
Abs Immature Granulocytes: 0 10*3/uL (ref 0.00–0.07)
Band Neutrophils: 3 %
Basophils Absolute: 0 10*3/uL (ref 0.0–0.1)
Basophils Relative: 1 %
Eosinophils Absolute: 0.1 10*3/uL (ref 0.0–0.5)
Eosinophils Relative: 5 %
HCT: 27 % — ABNORMAL LOW (ref 39.0–52.0)
Hemoglobin: 8.9 g/dL — ABNORMAL LOW (ref 13.0–17.0)
Lymphocytes Relative: 7 %
Lymphs Abs: 0.1 10*3/uL — ABNORMAL LOW (ref 0.7–4.0)
MCH: 29.9 pg (ref 26.0–34.0)
MCHC: 33 g/dL (ref 30.0–36.0)
MCV: 90.6 fL (ref 80.0–100.0)
Metamyelocytes Relative: 2 %
Monocytes Absolute: 0 10*3/uL — ABNORMAL LOW (ref 0.1–1.0)
Monocytes Relative: 2 %
Neutro Abs: 1.5 10*3/uL — ABNORMAL LOW (ref 1.7–7.7)
Neutrophils Relative %: 80 %
Platelet Count: 44 10*3/uL — ABNORMAL LOW (ref 150–400)
RBC: 2.98 MIL/uL — ABNORMAL LOW (ref 4.22–5.81)
RDW: 15.7 % — ABNORMAL HIGH (ref 11.5–15.5)
WBC Count: 1.8 10*3/uL — ABNORMAL LOW (ref 4.0–10.5)
nRBC: 0 % (ref 0.0–0.2)

## 2020-08-09 MED ORDER — SODIUM CHLORIDE 0.9% FLUSH
10.0000 mL | INTRAVENOUS | Status: DC | PRN
  Administered 2020-08-09: 10 mL via INTRAVENOUS
  Filled 2020-08-09: qty 10

## 2020-08-09 MED ORDER — HEPARIN SOD (PORK) LOCK FLUSH 100 UNIT/ML IV SOLN
500.0000 [IU] | Freq: Once | INTRAVENOUS | Status: AC
  Administered 2020-08-09: 500 [IU] via INTRAVENOUS
  Filled 2020-08-09: qty 5

## 2020-08-09 NOTE — Telephone Encounter (Signed)
Called pt did not answer message left with some urgency to please return call

## 2020-08-09 NOTE — Telephone Encounter (Signed)
-----   Message from Alla Feeling, NP sent at 08/09/2020 10:32 AM EST ----- Please review labs with him, he is pancytopenic. ANC 1.5, plt 44, hg 8.9. Have him return for labs Monday with possible transfusion, I will send schedule message. Call us if he has shortness of breath, profound weakness, bleeding, fever, chills, etc.   Thanks, Regan Rakers

## 2020-08-10 ENCOUNTER — Other Ambulatory Visit: Payer: Self-pay

## 2020-08-10 ENCOUNTER — Telehealth: Payer: Self-pay

## 2020-08-10 DIAGNOSIS — D696 Thrombocytopenia, unspecified: Secondary | ICD-10-CM

## 2020-08-10 DIAGNOSIS — D649 Anemia, unspecified: Secondary | ICD-10-CM

## 2020-08-10 NOTE — Telephone Encounter (Signed)
error 

## 2020-08-13 ENCOUNTER — Inpatient Hospital Stay: Payer: Medicare HMO

## 2020-08-13 ENCOUNTER — Other Ambulatory Visit: Payer: Self-pay

## 2020-08-13 DIAGNOSIS — D696 Thrombocytopenia, unspecified: Secondary | ICD-10-CM

## 2020-08-13 DIAGNOSIS — D649 Anemia, unspecified: Secondary | ICD-10-CM

## 2020-08-13 DIAGNOSIS — C8339 Diffuse large B-cell lymphoma, extranodal and solid organ sites: Secondary | ICD-10-CM | POA: Diagnosis not present

## 2020-08-13 DIAGNOSIS — Z95828 Presence of other vascular implants and grafts: Secondary | ICD-10-CM

## 2020-08-13 LAB — CMP (CANCER CENTER ONLY)
ALT: 25 U/L (ref 0–44)
AST: 21 U/L (ref 15–41)
Albumin: 3.2 g/dL — ABNORMAL LOW (ref 3.5–5.0)
Alkaline Phosphatase: 62 U/L (ref 38–126)
Anion gap: 8 (ref 5–15)
BUN: 12 mg/dL (ref 8–23)
CO2: 23 mmol/L (ref 22–32)
Calcium: 8.7 mg/dL — ABNORMAL LOW (ref 8.9–10.3)
Chloride: 107 mmol/L (ref 98–111)
Creatinine: 0.88 mg/dL (ref 0.61–1.24)
GFR, Estimated: >60 mL/min (ref >60)
Glucose, Bld: 149 mg/dL — ABNORMAL HIGH (ref 70–99)
Potassium: 3.4 mmol/L — ABNORMAL LOW (ref 3.5–5.1)
Sodium: 138 mmol/L (ref 135–145)
Total Bilirubin: 0.4 mg/dL (ref 0.3–1.2)
Total Protein: 5.4 g/dL — ABNORMAL LOW (ref 6.5–8.1)

## 2020-08-13 LAB — CBC WITH DIFFERENTIAL (CANCER CENTER ONLY)
Abs Immature Granulocytes: 0.85 10*3/uL — ABNORMAL HIGH (ref 0.00–0.07)
Basophils Absolute: 0 10*3/uL (ref 0.0–0.1)
Basophils Relative: 1 %
Eosinophils Absolute: 0.1 10*3/uL (ref 0.0–0.5)
Eosinophils Relative: 2 %
HCT: 26 % — ABNORMAL LOW (ref 39.0–52.0)
Hemoglobin: 8.6 g/dL — ABNORMAL LOW (ref 13.0–17.0)
Immature Granulocytes: 19 %
Lymphocytes Relative: 6 %
Lymphs Abs: 0.3 10*3/uL — ABNORMAL LOW (ref 0.7–4.0)
MCH: 30 pg (ref 26.0–34.0)
MCHC: 33.1 g/dL (ref 30.0–36.0)
MCV: 90.6 fL (ref 80.0–100.0)
Monocytes Absolute: 0.9 10*3/uL (ref 0.1–1.0)
Monocytes Relative: 20 %
Neutro Abs: 2.4 10*3/uL (ref 1.7–7.7)
Neutrophils Relative %: 52 %
Platelet Count: 66 10*3/uL — ABNORMAL LOW (ref 150–400)
RBC: 2.87 MIL/uL — ABNORMAL LOW (ref 4.22–5.81)
RDW: 16.7 % — ABNORMAL HIGH (ref 11.5–15.5)
WBC Count: 4.5 10*3/uL (ref 4.0–10.5)
nRBC: 0.9 % — ABNORMAL HIGH (ref 0.0–0.2)

## 2020-08-13 LAB — SAMPLE TO BLOOD BANK

## 2020-08-13 MED ORDER — SODIUM CHLORIDE 0.9% FLUSH
10.0000 mL | INTRAVENOUS | Status: DC | PRN
  Administered 2020-08-13: 10 mL via INTRAVENOUS
  Filled 2020-08-13: qty 10

## 2020-08-13 MED ORDER — HEPARIN SOD (PORK) LOCK FLUSH 100 UNIT/ML IV SOLN
500.0000 [IU] | Freq: Once | INTRAVENOUS | Status: DC
  Administered 2020-08-13: 500 [IU] via INTRAVENOUS
  Filled 2020-08-13: qty 5

## 2020-08-13 NOTE — Progress Notes (Signed)
Per Dr Burr Medico, no need for transfusion based on today's CBC results. Patient verbalized understanding.

## 2020-08-15 NOTE — Progress Notes (Signed)
John [name]   Telephone:([phone] Fax:(336) [phone]   Clinic Follow up Note   Patient Care Team: Christain Sacramento, MD as PCP - General (Family Medicine)  Date of Service:  08/17/2020  CHIEF COMPLAINT: F/u ofDiffuse large B cell lymphoma  SUMMARY OF ONCOLOGIC HISTORY: Oncology History Overview Note  Cancer Staging Diffuse large B cell lymphoma ([place]) Staging form: Hodgkin and Non-Hodgkin Lymphoma, AJCC 8th Edition - Clinical stage from 04/23/2020: Stage IV (Diffuse large B-cell lymphoma) - Signed by Truitt Merle, MD on 04/29/2020    Diffuse large B cell lymphoma ([place])  04/20/2020 Imaging   CT CAP  IMPRESSION: 1. Large anterior mediastinal/prevascular space mass/adenopathy. The mass extends superiorly and abuts the upper trachea. 2. Large hypodense lesion in the right lobe of the liver as well as ill-defined splenic hypodense lesions. Further characterization with MRI without and with contrast recommended. The liver lesion is amenable to percutaneous tissue sampling. 3. Extensive mesenteric and retroperitoneal adenopathy. 4. Sigmoid diverticulosis. No bowel obstruction. Normal appendix. 5. Aortic Atherosclerosis (ICD10-I70.0).   04/23/2020 Cancer Staging   Staging form: Hodgkin and Non-Hodgkin Lymphoma, AJCC 8th Edition - Clinical stage from 04/23/2020: Stage IV (Diffuse large B-cell lymphoma) - Signed by Truitt Merle, MD on 04/29/2020   04/23/2020 Initial Biopsy   FINAL MICROSCOPIC DIAGNOSIS:   A. LIVER, RIGHT MASS, NEEDLE CORE BIOPSY:  - Large B-cell lymphoma  - See comment     COMMENT:   The sections show needle core biopsy fragments displaying effacement of  the architecture by a dense infiltrate of primarily large atypical  lymphoid appearing cells displaying vesicular chromatin and small  nucleoli.  This is associated with apoptosis and brisk mitosis.  The  appearance is diffuse with lack of atypical follicles.  A large battery  of  immunohistochemical stains was performed and shows that the atypical  lymphoid cells are positive for LCA, CD20, PAX 5, BCL-2, BCL6, CD5,  MUM-1 (partial).  The atypical lymphoid cells are negative for CD10,  CD30, CD34, CD138, cyclin D1, TdT, and EBV in situ hybridization,  synaptophysin, cytokeratin AE1/AE3, cytokeratin 20, cytokeratin 7,  cytokeratin 5/6, TTF-1, CD56, CDX2, and p40.  There is an admixed  relatively minor population of T-cells as primarily seen with CD3.  The  overall features are consistent with large B-cell lymphoma, ABC subtype.  The lymphomatous process displays expression of CD5.  This phenotype is  seen in 5-10% of mostly de novo large B-cell lymphoma cases or rarely as  a transformation of a low-grade B-cell lymphoma such as small  lymphocytic lymphoma.  Clinical correlation is recommended.    04/25/2020 Imaging   CT AP  IMPRESSION: 1. Development of high-density ascites in the abdomen and pelvis. Findings are most compatible with a small to moderate amount of hemoperitoneum and related to the recent liver biopsy. 2. Extensive lymphadenopathy throughout the abdomen and pelvis with a large mass or nodal mass near the pancreatic head. There are additional lesions involving the liver and spleen. Findings are suggestive for metastatic disease. 3. Development of small bilateral pleural effusions.   These results were called by telephone at the time of interpretation on 04/25/2020 at 1:43 pm to provider ERIC [place] who verbally acknowledged these results.   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 Marked reduction  in volume of anterior mediastinal mass now with low metabolic activity ( [place] 2). 2. Reduction in volume of retroperitoneal nodal mass  adjacent the pancreas with low metabolic activity ( [place] 3). 3. No increased metabolic activity associated with the spleen 4. Interval contraction of hepatic hematoma post biopsy. No hypermetabolic elements within the hepatic lesion. 5. Grade 2 anterolisthesis L4 on L5.        CURRENT THERAPY:  Inpatient R-EPOCHq3weeksfor 6 cycles starting 05/01/20 with intrathecal prophylactic treatmentstarting with C2  INTERVAL HISTORY:  John [name] is here for a follow up. He presents to the clinic with his wife. He notes with C5 he felt stable with fatigue as other cycles. He denies fever, chills or pain. He note she is eating adequately and able to maintain weight. He denies constipation.    REVIEW OF SYSTEMS:   Constitutional: Denies fevers, chills or abnormal weight loss (+) Stable fatigue  Eyes: Denies blurriness of vision Ears, nose, mouth, throat, and face: Denies mucositis or sore throat Respiratory: Denies cough, dyspnea or wheezes Cardiovascular: Denies palpitation, chest discomfort or lower extremity swelling Gastrointestinal:  Denies nausea, heartburn or change in bowel habits Skin: Denies abnormal skin rashes Lymphatics: Denies new lymphadenopathy or easy bruising Neurological:Denies numbness, tingling or new weaknesses Behavioral/Psych: Mood is stable, no new changes  All other systems were reviewed with the patient and are negative.  MEDICAL HISTORY:  Past Medical History:  Diagnosis Date  . Cancer ([place])   . Goals of care, counseling/discussion 05/06/2020  . Hypertension   . Knee pain, right     SURGICAL HISTORY: Past Surgical History:  Procedure Laterality Date  . FOOT SURGERY    . IR IMAGING GUIDED PORT INSERTION  05/28/2020  . TONSILLECTOMY      I have reviewed the social history and family history with the patient and they are unchanged from previous note.  ALLERGIES:  has No Known Allergies.  MEDICATIONS:  Current Outpatient Medications   Medication Sig Dispense Refill  . allopurinol (ZYLOPRIM) 100 MG tablet TAKE 1 TABLET(100 MG) BY MOUTH DAILY (Patient taking differently: Take 100 mg by mouth daily.) 90 tablet 1  . Ascorbic Acid (VITAMIN C WITH ROSE HIPS) 1000 MG tablet Take 1,000 mg by mouth daily.    Marland Kitchen aspirin EC 81 MG tablet Take 81 mg by mouth daily.    Marland Kitchen doxazosin (CARDURA) 4 MG tablet Take 4 mg by mouth at bedtime.    . lidocaine-prilocaine (EMLA) cream Apply 1 application topically as needed. (Patient taking differently: Apply 1 application topically daily as needed (pain).) 30 g 0  . magic mouthwash w/lidocaine SOLN Take 15 mLs by mouth 4 (four) times daily as needed for mouth pain. 400 mL 0  . melatonin 3 MG TABS tablet Take 2 tablets (6 mg total) by mouth at bedtime as needed (sleep). (Patient taking differently: Take 6 mg by mouth at bedtime.) 30 tablet 0  . metoprolol tartrate (LOPRESSOR) 25 MG tablet Take 0.5 tablets (12.5 mg total) by mouth 2 (two) times daily. (Patient taking differently: Take 12.5 mg by mouth in the morning.) 30 tablet 0  . Multiple Vitamins-Minerals (MULTIVITAMIN PO) Take 1 tablet by mouth daily.    . Nutritional Supplements (BOOST MAX PROTEIN PO) Take 237 mLs by mouth daily. Chocolate    . Omega-3 Fatty Acids (OMEGA 3 PO) Take 1 tablet by mouth daily.    . ondansetron (ZOFRAN) 8 MG tablet Take 1 tablet (8 mg total) by mouth every 8 (eight) hours  as needed for nausea or vomiting. 20 tablet 0  . pantoprazole (PROTONIX) 40 MG tablet Take 1 tablet (40 mg total) by mouth daily. 30 tablet 3  . polyethylene glycol (MIRALAX / GLYCOLAX) 17 g packet Take 17 g by mouth daily as needed for mild constipation.    . prochlorperazine (COMPAZINE) 10 MG tablet Take 1 tablet (10 mg total) by mouth every 6 (six) hours as needed for nausea or vomiting. 30 tablet 0  . senna-docusate (SENOKOT-S) 8.6-50 MG tablet Take 2 tablets by mouth 2 (two) times daily as needed for mild constipation. (Patient taking differently:  Take 1 tablet by mouth 2 (two) times daily.)    . simethicone (MYLICON) 80 MG chewable tablet Chew 1 tablet (80 mg total) by mouth 4 (four) times daily as needed for flatulence. 30 tablet 0  . Sodium Chloride-Sodium Bicarb (SODIUM BICARBONATE/SODIUM CHLORIDE) SOLN 1 application by Mouth Rinse route as needed for dry mouth or mouth pain.     No current facility-administered medications for this visit.    PHYSICAL EXAMINATION: ECOG PERFORMANCE STATUS: 1 - Symptomatic but completely ambulatory  Vitals:   08/17/20 1516  BP: 128/62  Pulse: 90  Resp: 15  Temp: 98.2 F (36.8 C)  SpO2: 100%   Filed Weights   08/17/20 1516  Weight: 211 lb 1.6 oz (95.8 kg)    GENERAL:alert, no distress and comfortable SKIN: skin color, texture, turgor are normal, no rashes or significant lesions EYES: normal, Conjunctiva are pink and non-injected, sclera clear  NECK: supple, thyroid normal size, non-tender, without nodularity LYMPH:  no palpable lymphadenopathy in the cervical, axillary  LUNGS: clear to auscultation and percussion with normal breathing effort HEART: regular rate & rhythm and no murmurs and no lower extremity edema ABDOMEN:abdomen soft, non-tender and normal bowel sounds Musculoskeletal:no cyanosis of digits and no clubbing  NEURO: alert & oriented x 3 with fluent speech, no focal motor/sensory deficits  LABORATORY DATA:  I have reviewed the data as listed CBC Latest Ref Rng & Units 08/17/2020 08/13/2020 08/09/2020  WBC 4.0 - 10.5 K/uL 5.7 4.5 1.8(L)  Hemoglobin 13.0 - 17.0 g/dL 9.4(L) 8.6(L) 8.9(L)  Hematocrit 39.0 - 52.0 % 28.2(L) 26.0(L) 27.0(L)  Platelets 150 - 400 K/uL 96(L) 66(L) 44(L)     CMP Latest Ref Rng & Units 08/17/2020 08/13/2020 08/09/2020  Glucose 70 - 99 mg/dL 121(H) 149(H) 151(H)  BUN 8 - 23 mg/dL 14 12 19   Creatinine 0.61 - 1.24 mg/dL 0.85 0.88 0.83  Sodium 135 - 145 mmol/L 144 138 136  Potassium 3.5 - 5.1 mmol/L 3.8 3.4(L) 3.8  Chloride 98 - 111 mmol/L 110 107 105   CO2 22 - 32 mmol/L 24 23 22   Calcium 8.9 - 10.3 mg/dL 8.4(L) 8.7(L) 8.9  Total Protein 6.5 - 8.1 g/dL 6.0(L) 5.4(L) 5.8(L)  Total Bilirubin 0.3 - 1.2 mg/dL 0.4 0.4 0.7  Alkaline Phos 38 - 126 U/L 77 62 70  AST 15 - 41 U/L 22 21 15   ALT 0 - 44 U/L 19 25 19       RADIOGRAPHIC STUDIES: I have personally reviewed the radiological images as listed and agreed with the findings in the report. No results found.   ASSESSMENT & PLAN:  ZAKI GERTSCH is a 75 y.o. male with    1.Diffuse large B cell lymphoma, ABC Subtype, stage IV  -After worsening fatigue pt presented to ED. 04/20/20 CT CAP showedlargemediastinal mass and diffuseliver masses, and diffuse retroperitoneal lymphadenopathy. -Liver biopsy from 04/23/20 shows diffuse large  B-Cell lymphoma,ABC subtype, which carries a worse prognosis. Given his liver metastasis and LN involvement, this is stage IV. His lymphoma FISH panel isnegative fordouble hit lymphoma -I started him onInpatient R-EPOCHq3weeksfor 6 cycleson11/23/21.Given hishigh risk for CNS involvement,I startedintrathecal treatmentwith methotrexateprophylacticallyfromC2. -S/p C3 he had excellent partial response on 07/04/20 PET scan.  -S/p C5 he remains stable and able to maintain weight and most of energy Labs reviewed, CBC and CMP WNL except Hg 9.4, plt 96K, BG 121, Ca 8.4, Protein 6, albumin 3.3. Overall adequate to proceed with C6 treatment next week.  -Plan to admit [facility] to [facility] hospital on Monday, 3/14 for final cycle treatment EPOCH, Rituxan and G-CSF injection in clinic on 3/21.  -I discussed after final treatment will scan him 1-2 months later.  -f/u in 3 weeks.   2. Pancytopenia, secondary to chemo -He was given GCSF after Cycle 1.He still had pancytopenia with neutropenic fever. Hewas treated as inpatient and required blood and platelet transfusion. -Helastrequired blood transfusion on 06/14/20.but none since then  -mild, stable  -He  received Evusheld vaccine on 08/16/20 and 08/17/20. I recommend letting WBC recovering in 1 month before restarting outings again.    3. Fatigue, Low appetite,and weight loss -secondary to #1and chemotherapy -overall much better, he has been gaining weight back  -Stable on treatment, weight maintained.    PLAN: -lab reviewed  -Proceed with second Evusheld injection today  -Plan to admit on 3/14 for cycle 6EPOCHchemowith same dose, and IT MTX on day 2, he will get COVID test tomorrow  -Rituxanandpegfilgrastimon day 8 in office  -lab and f/u 3 weeks     No problem-specific Assessment & Plan notes found for this encounter.   No orders of the defined types were placed in this encounter.  All questions were answered. The patient knows to call the clinic with any problems, questions or concerns. No barriers to learning was detected. The total time spent in the appointment was 30 minutes.     Truitt Merle, MD 08/17/2020   I, Joslyn Devon, am acting as scribe for Truitt Merle, MD.   I have reviewed the above documentation for accuracy and completeness, and I agree with the above.

## 2020-08-17 ENCOUNTER — Other Ambulatory Visit: Payer: Self-pay

## 2020-08-17 ENCOUNTER — Inpatient Hospital Stay: Payer: Medicare HMO

## 2020-08-17 ENCOUNTER — Inpatient Hospital Stay: Payer: Medicare HMO | Admitting: Hematology

## 2020-08-17 ENCOUNTER — Encounter: Payer: Self-pay | Admitting: Hematology

## 2020-08-17 VITALS — BP 138/66 | HR 92 | Temp 97.7°F | Resp 18

## 2020-08-17 VITALS — BP 128/62 | HR 90 | Temp 98.2°F | Resp 15 | Ht 71.0 in | Wt 211.1 lb

## 2020-08-17 DIAGNOSIS — Z452 Encounter for adjustment and management of vascular access device: Secondary | ICD-10-CM | POA: Diagnosis not present

## 2020-08-17 DIAGNOSIS — Z298 Encounter for other specified prophylactic measures: Secondary | ICD-10-CM | POA: Diagnosis present

## 2020-08-17 DIAGNOSIS — Z95828 Presence of other vascular implants and grafts: Secondary | ICD-10-CM

## 2020-08-17 DIAGNOSIS — N179 Acute kidney failure, unspecified: Secondary | ICD-10-CM | POA: Diagnosis not present

## 2020-08-17 DIAGNOSIS — C8338 Diffuse large B-cell lymphoma, lymph nodes of multiple sites: Secondary | ICD-10-CM | POA: Diagnosis not present

## 2020-08-17 DIAGNOSIS — C8339 Diffuse large B-cell lymphoma, extranodal and solid organ sites: Secondary | ICD-10-CM | POA: Diagnosis present

## 2020-08-17 DIAGNOSIS — D61810 Antineoplastic chemotherapy induced pancytopenia: Secondary | ICD-10-CM | POA: Diagnosis not present

## 2020-08-17 DIAGNOSIS — R5383 Other fatigue: Secondary | ICD-10-CM | POA: Diagnosis not present

## 2020-08-17 DIAGNOSIS — D649 Anemia, unspecified: Secondary | ICD-10-CM | POA: Diagnosis not present

## 2020-08-17 DIAGNOSIS — R634 Abnormal weight loss: Secondary | ICD-10-CM | POA: Diagnosis not present

## 2020-08-17 DIAGNOSIS — Z9221 Personal history of antineoplastic chemotherapy: Secondary | ICD-10-CM | POA: Diagnosis not present

## 2020-08-17 LAB — CBC WITH DIFFERENTIAL (CANCER CENTER ONLY)
Abs Immature Granulocytes: 0.26 10*3/uL — ABNORMAL HIGH (ref 0.00–0.07)
Basophils Absolute: 0 10*3/uL (ref 0.0–0.1)
Basophils Relative: 1 %
Eosinophils Absolute: 0 10*3/uL (ref 0.0–0.5)
Eosinophils Relative: 1 %
HCT: 28.2 % — ABNORMAL LOW (ref 39.0–52.0)
Hemoglobin: 9.4 g/dL — ABNORMAL LOW (ref 13.0–17.0)
Immature Granulocytes: 5 %
Lymphocytes Relative: 7 %
Lymphs Abs: 0.4 10*3/uL — ABNORMAL LOW (ref 0.7–4.0)
MCH: 30.7 pg (ref 26.0–34.0)
MCHC: 33.3 g/dL (ref 30.0–36.0)
MCV: 92.2 fL (ref 80.0–100.0)
Monocytes Absolute: 0.7 10*3/uL (ref 0.1–1.0)
Monocytes Relative: 13 %
Neutro Abs: 4.3 10*3/uL (ref 1.7–7.7)
Neutrophils Relative %: 73 %
Platelet Count: 96 10*3/uL — ABNORMAL LOW (ref 150–400)
RBC: 3.06 MIL/uL — ABNORMAL LOW (ref 4.22–5.81)
RDW: 17 % — ABNORMAL HIGH (ref 11.5–15.5)
WBC Count: 5.7 10*3/uL (ref 4.0–10.5)
nRBC: 0 % (ref 0.0–0.2)

## 2020-08-17 LAB — CMP (CANCER CENTER ONLY)
ALT: 19 U/L (ref 0–44)
AST: 22 U/L (ref 15–41)
Albumin: 3.3 g/dL — ABNORMAL LOW (ref 3.5–5.0)
Alkaline Phosphatase: 77 U/L (ref 38–126)
Anion gap: 10 (ref 5–15)
BUN: 14 mg/dL (ref 8–23)
CO2: 24 mmol/L (ref 22–32)
Calcium: 8.4 mg/dL — ABNORMAL LOW (ref 8.9–10.3)
Chloride: 110 mmol/L (ref 98–111)
Creatinine: 0.85 mg/dL (ref 0.61–1.24)
GFR, Estimated: >60 mL/min (ref >60)
Glucose, Bld: 121 mg/dL — ABNORMAL HIGH (ref 70–99)
Potassium: 3.8 mmol/L (ref 3.5–5.1)
Sodium: 144 mmol/L (ref 135–145)
Total Bilirubin: 0.4 mg/dL (ref 0.3–1.2)
Total Protein: 6 g/dL — ABNORMAL LOW (ref 6.5–8.1)

## 2020-08-17 MED ORDER — SODIUM CHLORIDE 0.9% FLUSH
10.0000 mL | INTRAVENOUS | Status: DC | PRN
  Administered 2020-08-17: 10 mL via INTRAVENOUS
  Filled 2020-08-17: qty 10

## 2020-08-17 MED ORDER — CILGAVIMAB (PART OF EVUSHELD) INJECTION
150.0000 mg | Freq: Once | INTRAMUSCULAR | Status: AC
  Administered 2020-08-17: 150 mg via INTRAMUSCULAR
  Filled 2020-08-17: qty 1.5

## 2020-08-17 MED ORDER — TIXAGEVIMAB (PART OF EVUSHELD) INJECTION
150.0000 mg | Freq: Once | INTRAMUSCULAR | Status: AC
  Administered 2020-08-17: 150 mg via INTRAMUSCULAR
  Filled 2020-08-17: qty 1.5

## 2020-08-17 NOTE — Patient Instructions (Signed)
Implanted Port Insertion, Care After This sheet gives you information about how to care for yourself after your procedure. Your health care provider may also give you more specific instructions. If you have problems or questions, contact your health care provider. What can I expect after the procedure? After the procedure, it is common to have:  Discomfort at the port insertion site.  Bruising on the skin over the port. This should improve over 3-4 days. Follow these instructions at home: Port care  After your port is placed, you will get a manufacturer's information card. The card has information about your port. Keep this card with you at all times.  Take care of the port as told by your health care provider. Ask your health care provider if you or a family member can get training for taking care of the port at home. A home health care nurse may also take care of the port.  Make sure to remember what type of port you have. Incision care  Follow instructions from your health care provider about how to take care of your port insertion site. Make sure you: ? Wash your hands with soap and water before and after you change your bandage (dressing). If soap and water are not available, use hand sanitizer. ? Change your dressing as told by your health care provider. ? Leave stitches (sutures), skin glue, or adhesive strips in place. These skin closures may need to stay in place for 2 weeks or longer. If adhesive strip edges start to loosen and curl up, you may trim the loose edges. Do not remove adhesive strips completely unless your health care provider tells you to do that.  Check your port insertion site every day for signs of infection. Check for: ? Redness, swelling, or pain. ? Fluid or blood. ? Warmth. ? Pus or a bad smell.      Activity  Return to your normal activities as told by your health care provider. Ask your health care provider what activities are safe for you.  Do not  lift anything that is heavier than 10 lb (4.5 kg), or the limit that you are told, until your health care provider says that it is safe. General instructions  Take over-the-counter and prescription medicines only as told by your health care provider.  Do not take baths, swim, or use a hot tub until your health care provider approves. Ask your health care provider if you may take showers. You may only be allowed to take sponge baths.  Do not drive for 24 hours if you were given a sedative during your procedure.  Wear a medical alert bracelet in case of an emergency. This will tell any health care providers that you have a port.  Keep all follow-up visits as told by your health care provider. This is important. Contact a health care provider if:  You cannot flush your port with saline as directed, or you cannot draw blood from the port.  You have a fever or chills.  You have redness, swelling, or pain around your port insertion site.  You have fluid or blood coming from your port insertion site.  Your port insertion site feels warm to the touch.  You have pus or a bad smell coming from the port insertion site. Get help right away if:  You have chest pain or shortness of breath.  You have bleeding from your port that you cannot control. Summary  Take care of the port as told by your   health care provider. Keep the manufacturer's information card with you at all times.  Change your dressing as told by your health care provider.  Contact a health care provider if you have a fever or chills or if you have redness, swelling, or pain around your port insertion site.  Keep all follow-up visits as told by your health care provider. This information is not intended to replace advice given to you by your health care provider. Make sure you discuss any questions you have with your health care provider. Document Revised: 12/22/2017 Document Reviewed: 12/22/2017 [employer]vier Patient Education   2021 [employer]  

## 2020-08-17 NOTE — Progress Notes (Signed)
Patient observed 1 hour after evusheld injection.  Discharged in stable condition with no complaints

## 2020-08-18 ENCOUNTER — Other Ambulatory Visit (HOSPITAL_COMMUNITY)
Admission: RE | Admit: 2020-08-18 | Discharge: 2020-08-18 | Disposition: A | Discharge status: Home / Self Care | Payer: Medicare HMO | Source: Ambulatory Visit | Attending: Hematology | Admitting: Hematology

## 2020-08-18 DIAGNOSIS — Z20822 Contact with and (suspected) exposure to covid-19: Secondary | ICD-10-CM | POA: Insufficient documentation

## 2020-08-18 DIAGNOSIS — Z01812 Encounter for preprocedural laboratory examination: Secondary | ICD-10-CM | POA: Insufficient documentation

## 2020-08-18 LAB — SARS CORONAVIRUS 2 (TAT 6-24 HRS): SARS Coronavirus 2: NEGATIVE

## 2020-08-20 ENCOUNTER — Telehealth: Payer: Self-pay | Admitting: Hematology

## 2020-08-20 ENCOUNTER — Inpatient Hospital Stay (HOSPITAL_COMMUNITY)
Admission: EM | Admit: 2020-08-20 | Discharge: 2020-08-24 | DRG: 847 | Disposition: A | Discharge status: Home / Self Care | Payer: Medicare HMO | Source: Ambulatory Visit | Attending: Hematology | Admitting: Hematology

## 2020-08-20 ENCOUNTER — Other Ambulatory Visit: Payer: Self-pay

## 2020-08-20 ENCOUNTER — Encounter (HOSPITAL_COMMUNITY): Payer: Self-pay | Admitting: Hematology

## 2020-08-20 DIAGNOSIS — Z20822 Contact with and (suspected) exposure to covid-19: Secondary | ICD-10-CM | POA: Diagnosis present

## 2020-08-20 DIAGNOSIS — T50905A Adverse effect of unspecified drugs, medicaments and biological substances, initial encounter: Secondary | ICD-10-CM | POA: Diagnosis present

## 2020-08-20 DIAGNOSIS — C8338 Diffuse large B-cell lymphoma, lymph nodes of multiple sites: Secondary | ICD-10-CM | POA: Diagnosis present

## 2020-08-20 DIAGNOSIS — N400 Enlarged prostate without lower urinary tract symptoms: Secondary | ICD-10-CM | POA: Diagnosis present

## 2020-08-20 DIAGNOSIS — D6481 Anemia due to antineoplastic chemotherapy: Secondary | ICD-10-CM | POA: Diagnosis present

## 2020-08-20 DIAGNOSIS — Z5111 Encounter for antineoplastic chemotherapy: Secondary | ICD-10-CM | POA: Diagnosis present

## 2020-08-20 DIAGNOSIS — Z23 Encounter for immunization: Secondary | ICD-10-CM | POA: Diagnosis not present

## 2020-08-20 DIAGNOSIS — D696 Thrombocytopenia, unspecified: Secondary | ICD-10-CM | POA: Diagnosis present

## 2020-08-20 DIAGNOSIS — E876 Hypokalemia: Secondary | ICD-10-CM | POA: Diagnosis present

## 2020-08-20 DIAGNOSIS — T451X5A Adverse effect of antineoplastic and immunosuppressive drugs, initial encounter: Secondary | ICD-10-CM | POA: Diagnosis present

## 2020-08-20 DIAGNOSIS — C8330 Diffuse large B-cell lymphoma, unspecified site: Secondary | ICD-10-CM

## 2020-08-20 DIAGNOSIS — I10 Essential (primary) hypertension: Secondary | ICD-10-CM | POA: Diagnosis present

## 2020-08-20 DIAGNOSIS — K5903 Drug induced constipation: Secondary | ICD-10-CM | POA: Diagnosis present

## 2020-08-20 DIAGNOSIS — C8590 Non-Hodgkin lymphoma, unspecified, unspecified site: Secondary | ICD-10-CM

## 2020-08-20 LAB — COMPREHENSIVE METABOLIC PANEL
ALT: 21 U/L (ref 0–44)
AST: 24 U/L (ref 15–41)
Albumin: 3.4 g/dL — ABNORMAL LOW (ref 3.5–5.0)
Alkaline Phosphatase: 61 U/L (ref 38–126)
Anion gap: 14 (ref 5–15)
BUN: 16 mg/dL (ref 8–23)
CO2: 18 mmol/L — ABNORMAL LOW (ref 22–32)
Calcium: 8.5 mg/dL — ABNORMAL LOW (ref 8.9–10.3)
Chloride: 108 mmol/L (ref 98–111)
Creatinine, Ser: 0.76 mg/dL (ref 0.61–1.24)
GFR, Estimated: >60 mL/min (ref >60)
Glucose, Bld: 138 mg/dL — ABNORMAL HIGH (ref 70–99)
Potassium: 3.5 mmol/L (ref 3.5–5.1)
Sodium: 140 mmol/L (ref 135–145)
Total Bilirubin: 0.8 mg/dL (ref 0.3–1.2)
Total Protein: 5.7 g/dL — ABNORMAL LOW (ref 6.5–8.1)

## 2020-08-20 LAB — CBC WITH DIFFERENTIAL/PLATELET
Abs Immature Granulocytes: 0.04 K/uL (ref 0.00–0.07)
Basophils Absolute: 0.1 K/uL (ref 0.0–0.1)
Basophils Relative: 1 %
Eosinophils Absolute: 0 K/uL (ref 0.0–0.5)
Eosinophils Relative: 1 %
HCT: 28.7 % — ABNORMAL LOW (ref 39.0–52.0)
Hemoglobin: 9.4 g/dL — ABNORMAL LOW (ref 13.0–17.0)
Immature Granulocytes: 1 %
Lymphocytes Relative: 5 %
Lymphs Abs: 0.3 K/uL — ABNORMAL LOW (ref 0.7–4.0)
MCH: 30.5 pg (ref 26.0–34.0)
MCHC: 32.8 g/dL (ref 30.0–36.0)
MCV: 93.2 fL (ref 80.0–100.0)
Monocytes Absolute: 0.7 K/uL (ref 0.1–1.0)
Monocytes Relative: 12 %
Neutro Abs: 4.3 K/uL (ref 1.7–7.7)
Neutrophils Relative %: 80 %
Platelets: 107 K/uL — ABNORMAL LOW (ref 150–400)
RBC: 3.08 MIL/uL — ABNORMAL LOW (ref 4.22–5.81)
RDW: 17.2 % — ABNORMAL HIGH (ref 11.5–15.5)
WBC: 5.3 K/uL (ref 4.0–10.5)
nRBC: 0.6 % — ABNORMAL HIGH (ref 0.0–0.2)

## 2020-08-20 MED ORDER — SENNOSIDES-DOCUSATE SODIUM 8.6-50 MG PO TABS
1.0000 | ORAL_TABLET | Freq: Two times a day (BID) | ORAL | Status: DC
Start: 2020-08-20 — End: 2020-08-21
  Administered 2020-08-20: 1 via ORAL
  Filled 2020-08-20: qty 1

## 2020-08-20 MED ORDER — SODIUM CHLORIDE 0.9% FLUSH
10.0000 mL | Freq: Two times a day (BID) | INTRAVENOUS | Status: DC
  Administered 2020-08-20: 10 mL
  Administered 2020-08-20 – 2020-08-21 (×2): 20 mL
  Administered 2020-08-21 – 2020-08-23 (×4): 10 mL

## 2020-08-20 MED ORDER — HOT PACK MISC ONCOLOGY
1.0000 | Freq: Once | Status: DC | PRN
Start: 2020-08-20 — End: 2020-08-24
  Filled 2020-08-20: qty 1

## 2020-08-20 MED ORDER — SODIUM BICARBONATE/SODIUM CHLORIDE MOUTHWASH
1.0000 "application " | Freq: Four times a day (QID) | OROMUCOSAL | Status: DC
  Administered 2020-08-20 – 2020-08-23 (×15): 1 via OROMUCOSAL
  Filled 2020-08-20: qty 1000

## 2020-08-20 MED ORDER — ONDANSETRON HCL 8 MG PO TABS: 8.0000 mg | ORAL_TABLET | Freq: Three times a day (TID) | ORAL | Status: DC | PRN

## 2020-08-20 MED ORDER — PANTOPRAZOLE SODIUM 40 MG PO TBEC
40.0000 mg | DELAYED_RELEASE_TABLET | Freq: Every day | ORAL | Status: DC
  Administered 2020-08-21 – 2020-08-24 (×4): 40 mg via ORAL
  Filled 2020-08-20 (×4): qty 1

## 2020-08-20 MED ORDER — PROCHLORPERAZINE MALEATE 10 MG PO TABS: 10.0000 mg | ORAL_TABLET | Freq: Four times a day (QID) | ORAL | Status: DC | PRN

## 2020-08-20 MED ORDER — SIMETHICONE 80 MG PO CHEW: 80.0000 mg | CHEWABLE_TABLET | Freq: Four times a day (QID) | ORAL | Status: DC | PRN

## 2020-08-20 MED ORDER — MULTIVITAMIN PO TABS: 1.0000 | ORAL_TABLET | Freq: Every day | ORAL | Status: DC

## 2020-08-20 MED ORDER — METOPROLOL TARTRATE 25 MG PO TABS
12.5000 mg | ORAL_TABLET | Freq: Every morning | ORAL | Status: DC
  Administered 2020-08-21 – 2020-08-24 (×4): 12.5 mg via ORAL
  Filled 2020-08-20 (×4): qty 1

## 2020-08-20 MED ORDER — PANTOPRAZOLE SODIUM 40 MG PO TBEC: 40.0000 mg | DELAYED_RELEASE_TABLET | Freq: Every day | ORAL | Status: DC

## 2020-08-20 MED ORDER — SODIUM CHLORIDE 0.9 % IV SOLN
Freq: Once | INTRAVENOUS | Status: AC
  Administered 2020-08-20: 8 mg via INTRAVENOUS
  Filled 2020-08-20: qty 4

## 2020-08-20 MED ORDER — ADULT MULTIVITAMIN W/MINERALS CH
1.0000 | ORAL_TABLET | Freq: Every day | ORAL | Status: DC
  Administered 2020-08-21 – 2020-08-24 (×4): 1 via ORAL
  Filled 2020-08-20 (×4): qty 1

## 2020-08-20 MED ORDER — LIDOCAINE-PRILOCAINE 2.5-2.5 % EX CREA: 1.0000 "application " | TOPICAL_CREAM | Freq: Every day | CUTANEOUS | Status: DC | PRN

## 2020-08-20 MED ORDER — DOXAZOSIN MESYLATE 4 MG PO TABS
4.0000 mg | ORAL_TABLET | Freq: Every day | ORAL | Status: DC
Start: 2020-08-20 — End: 2020-08-24
  Administered 2020-08-20 – 2020-08-23 (×4): 4 mg via ORAL
  Filled 2020-08-20 (×4): qty 1

## 2020-08-20 MED ORDER — VINCRISTINE SULFATE CHEMO INJECTION 1 MG/ML
Freq: Once | INTRAVENOUS | Status: AC
  Filled 2020-08-20: qty 8

## 2020-08-20 MED ORDER — MAGIC MOUTHWASH W/LIDOCAINE
15.0000 mL | Freq: Four times a day (QID) | ORAL | Status: DC | PRN
Start: 2020-08-20 — End: 2020-08-24
  Filled 2020-08-20: qty 15

## 2020-08-20 MED ORDER — CHLORHEXIDINE GLUCONATE CLOTH 2 % EX PADS
6.0000 | MEDICATED_PAD | Freq: Every day | CUTANEOUS | Status: DC
  Administered 2020-08-20 – 2020-08-24 (×5): 6 via TOPICAL

## 2020-08-20 MED ORDER — COLD PACK MISC ONCOLOGY
1.0000 | Freq: Once | Status: DC | PRN
  Filled 2020-08-20: qty 1

## 2020-08-20 MED ORDER — SODIUM CHLORIDE 0.9% FLUSH: 10.0000 mL | INTRAVENOUS | Status: DC | PRN

## 2020-08-20 MED ORDER — POLYETHYLENE GLYCOL 3350 17 G PO PACK
17.0000 g | PACK | Freq: Every day | ORAL | Status: DC
  Administered 2020-08-21 – 2020-08-24 (×4): 17 g via ORAL
  Filled 2020-08-20 (×4): qty 1

## 2020-08-20 MED ORDER — SODIUM CHLORIDE 0.9 % IV SOLN: INTRAVENOUS | Status: DC

## 2020-08-20 MED ORDER — ALLOPURINOL 100 MG PO TABS
100.0000 mg | ORAL_TABLET | Freq: Every day | ORAL | Status: DC
  Administered 2020-08-21 – 2020-08-24 (×4): 100 mg via ORAL
  Filled 2020-08-20 (×4): qty 1

## 2020-08-20 MED ORDER — MELATONIN 3 MG PO TABS
6.0000 mg | ORAL_TABLET | Freq: Every day | ORAL | Status: DC
  Administered 2020-08-20 – 2020-08-23 (×4): 6 mg via ORAL
  Filled 2020-08-20 (×4): qty 2

## 2020-08-20 NOTE — H&P (Addendum)
[place]  [phone] Fax:([phone]) [place] H&P  Reason for Admission: Cycle #6 EPOCH-R  HPI: John [name] is a 75 year old Caucasian male, with past medical history of hypertension, BPH.  He was admitted to [facility] in November 2021 due to fatigue and constipation.  He was found to have severe hypercalcemia and CT scan showed a large mediastinal and liver mass with diffuse retroperitoneal lymphadenopathy.  He received IV fluids and pamidronate for treatment of hypercalcemia with resolution.  He also had hyperuricemia and was treated with rasburicase and then placed on allopurinol.  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  The patient was discharged home with a course of prednisone x5 days. Once biopsy resulted, it was recommended him to begin inpatient EPOCH-R for treatment of his lymphoma.    The patient is seen this morning for admission to the hospital.  He denies recent fevers, chills, chest pain, cough, shortness of breath.  He denies headaches and dizziness.  Denies mucositis. Denies abdominal pain, nausea, vomiting.  Bowels are moving without difficulty.  He has mild lower extremity edema.  Denies peripheral neuropathy. He patient is seen today for admission for cycle #6 of EPOCH-R.   Past Medical History:  Diagnosis Date  . Cancer ([place])   . Goals of care, counseling/discussion 05/06/2020  . Hypertension   . Knee pain, right     Socioeconomic History  . Marital status: Married    Spouse name: Not on file  . Number of children: 0  . Years of education: Not on file  . Highest education level: Not on file  Occupational History  . Occupation: retired Customer service manager   Tobacco Use  . Smoking status: Never Smoker  . Smokeless tobacco: Never Used  Vaping Use  . Vaping Use: Never used  Substance and Sexual Activity   . Alcohol use: No  . Drug use: No  . Sexual activity: Not on file  Other Topics Concern  . Not on file  Social History Narrative  . Not on file   Social Determinants of Health   Financial Resource Strain: Not on file  Food Insecurity: Not on file  Transportation Needs: Not on file  Physical Activity: Not on file  Stress: Not on file  Social Connections: Not on file  Intimate Partner Violence: Not on file  :  Review of Systems: A comprehensive 14 point review of systems was negative except as noted in the HPI.  Exam: Patient Vitals for the past 24 hrs: There were no vitals taken for this visit.  General:  well-nourished in no acute distress.   Eyes:  no scleral icterus.   ENT:  There were no oropharyngeal lesions.     Respiratory: lungs were clear bilaterally without wheezing or crackles.   Cardiovascular:  Regular rate and rhythm, S1/S2, without murmur, rub or gallop.  No pedal edema bilaterally. GI:  abdomen was soft, flat, nontender, nondistended, without organomegaly.   Musculoskeletal: Strength symmetrical in the upper and lower extremities. Skin exam was without echymosis, petichae.   Neuro exam was nonfocal. Patient was alert and oriented.  Attention was good.   Language was appropriate.  Mood was normal without depression.  Speech was not pressured.  Thought content was not tangential.    CBC    Component Value Date/Time   WBC 5.3 08/20/2020 0848  RBC 3.08 (L) 08/20/2020 0848   HGB 9.4 (L) 08/20/2020 0848   HGB 9.4 (L) 08/17/2020 1456   HCT 28.7 (L) 08/20/2020 0848   PLT 107 (L) 08/20/2020 0848   PLT 96 (L) 08/17/2020 1456   MCV 93.2 08/20/2020 0848   MCH 30.5 08/20/2020 0848   MCHC 32.8 08/20/2020 0848   RDW 17.2 (H) 08/20/2020 0848   LYMPHSABS 0.3 (L) 08/20/2020 0848   MONOABS 0.7 08/20/2020 0848   EOSABS 0.0 08/20/2020 0848   BASOSABS 0.1 08/20/2020 0848   CMP Latest Ref Rng & Units 08/20/2020 08/17/2020 08/13/2020  Glucose 70 - 99 mg/dL 138(H) 121(H)  149(H)  BUN 8 - 23 mg/dL 16 14 12   Creatinine 0.61 - 1.24 mg/dL 0.76 0.85 0.88  Sodium 135 - 145 mmol/L 140 144 138  Potassium 3.5 - 5.1 mmol/L 3.5 3.8 3.4(L)  Chloride 98 - 111 mmol/L 108 110 107  CO2 22 - 32 mmol/L 18(L) 24 23  Calcium 8.9 - 10.3 mg/dL 8.5(L) 8.4(L) 8.7(L)  Total Protein 6.5 - 8.1 g/dL 5.7(L) 6.0(L) 5.4(L)  Total Bilirubin 0.3 - 1.2 mg/dL 0.8 0.4 0.4  Alkaline Phos 38 - 126 U/L 61 77 62  AST 15 - 41 U/L 24 22 21   ALT 0 - 44 U/L 21 19 25     DG FLUORO GUIDED NEEDLE PLC ASPIRATION/INJECTION LOC  Result Date: 05/30/2020 CLINICAL DATA:  Intrathecal methotrexate injection for lymphoma. EXAM: FLUOROSCOPICALLY GUIDED LUMBAR PUNCTURE FOR INTRATHECAL CHEMOTHERAPY FLUOROSCOPY TIME:  1 minutes 0 seconds. PROCEDURE: Informed consent was obtained from the patient prior to the procedure, including potential complications of headache, allergy, and pain. With the patient prone, the lower back was prepped with Betadine. 1% Lidocaine was used for local anesthesia. Lumbar puncture was performed at the L3-4 level using a gauge needle with return of initially blood tinged but then clearCSF. 12 mg of methotrexate was injected into the subarachnoid space. The patient tolerated the procedure well without apparent complication. IMPRESSION: Successful intrathecal methotrexate injection under fluoroscopy. Electronically Signed   By: Lorin Picket M.D.   On: 05/30/2020 13:10   IR IMAGING GUIDED PORT INSERTION  Result Date: 05/28/2020 INDICATION: Recent diagnosis of large B-cell lymphoma. In need of durable intravenous access for chemotherapy administration. EXAM: IMPLANTED PORT A CATH PLACEMENT WITH ULTRASOUND AND FLUOROSCOPIC GUIDANCE COMPARISON:  CT the chest, abdomen and pelvis-04/20/2020 MEDICATIONS: Ancef 2 gm IV; The antibiotic was administered within an appropriate time interval prior to skin puncture. ANESTHESIA/SEDATION: Moderate (conscious) sedation was employed during this procedure. A  total of Versed 4 mg and Fentanyl 100 mcg was administered intravenously. Moderate Sedation Time: 27 minutes. The patient's level of consciousness and vital signs were monitored continuously by radiology nursing throughout the procedure under my direct supervision. CONTRAST:  None FLUOROSCOPY TIME:  18 seconds (8 mGy) COMPLICATIONS: None immediate. PROCEDURE: The procedure, risks, benefits, and alternatives were explained to the patient. Questions regarding the procedure were encouraged and answered. The patient understands and consents to the procedure. The right neck and chest were prepped with chlorhexidine in a sterile fashion, and a sterile drape was applied covering the operative field. Maximum barrier sterile technique with sterile gowns and gloves were used for the procedure. A timeout was performed prior to the initiation of the procedure. Local anesthesia was provided with 1% lidocaine with epinephrine. After creating a small venotomy incision, a micropuncture kit was utilized to access the internal jugular vein. Real-time ultrasound guidance was utilized for vascular access including the acquisition of a permanent  ultrasound image documenting patency of the accessed vessel. The microwire was utilized to measure appropriate catheter length. A subcutaneous port pocket was then created along the upper chest wall utilizing a combination of sharp and blunt dissection. The pocket was irrigated with sterile saline. A single lumen "Slim" sized power injectable port was chosen for placement. The 8 Fr catheter was tunneled from the port pocket site to the venotomy incision. The port was placed in the pocket. The external catheter was trimmed to appropriate length. At the venotomy, an 8 Fr peel-away sheath was placed over a guidewire under fluoroscopic guidance. The catheter was then placed through the sheath and the sheath was removed. Final catheter positioning was confirmed and documented with a fluoroscopic spot  radiograph. The port was accessed with a Huber needle, aspirated and flushed with heparinized saline. The venotomy site was closed with an interrupted 4-0 Vicryl suture. The port pocket incision was closed with interrupted 2-0 Vicryl suture. The skin was opposed with a running subcuticular 4-0 Vicryl suture. Dermabond and Steri-strips were applied to both incisions. Dressings were applied. The patient tolerated the procedure well without immediate post procedural complication. FINDINGS: After catheter placement, the tip lies within the superior cavoatrial junction. The catheter aspirates and flushes normally and is ready for immediate use. The port a Catheter was left accessed for the patient's impending hospitalization and initiation of chemotherapy. IMPRESSION: Successful placement of a right internal jugular approach power injectable Port-A-Cath. The catheter is ready for immediate use. Electronically Signed   By: Sandi Mariscal M.D.   On: 05/28/2020 10:55     DG FLUORO GUIDED NEEDLE PLC ASPIRATION/INJECTION LOC  Result Date: 05/30/2020 CLINICAL DATA:  Intrathecal methotrexate injection for lymphoma. EXAM: FLUOROSCOPICALLY GUIDED LUMBAR PUNCTURE FOR INTRATHECAL CHEMOTHERAPY FLUOROSCOPY TIME:  1 minutes 0 seconds. PROCEDURE: Informed consent was obtained from the patient prior to the procedure, including potential complications of headache, allergy, and pain. With the patient prone, the lower back was prepped with Betadine. 1% Lidocaine was used for local anesthesia. Lumbar puncture was performed at the L3-4 level using a gauge needle with return of initially blood tinged but then clearCSF. 12 mg of methotrexate was injected into the subarachnoid space. The patient tolerated the procedure well without apparent complication. IMPRESSION: Successful intrathecal methotrexate injection under fluoroscopy. Electronically Signed   By: Lorin Picket M.D.   On: 05/30/2020 13:10   IR IMAGING GUIDED PORT  INSERTION  Result Date: 05/28/2020 INDICATION: Recent diagnosis of large B-cell lymphoma. In need of durable intravenous access for chemotherapy administration. EXAM: IMPLANTED PORT A CATH PLACEMENT WITH ULTRASOUND AND FLUOROSCOPIC GUIDANCE COMPARISON:  CT the chest, abdomen and pelvis-04/20/2020 MEDICATIONS: Ancef 2 gm IV; The antibiotic was administered within an appropriate time interval prior to skin puncture. ANESTHESIA/SEDATION: Moderate (conscious) sedation was employed during this procedure. A total of Versed 4 mg and Fentanyl 100 mcg was administered intravenously. Moderate Sedation Time: 27 minutes. The patient's level of consciousness and vital signs were monitored continuously by radiology nursing throughout the procedure under my direct supervision. CONTRAST:  None FLUOROSCOPY TIME:  18 seconds (8 mGy) COMPLICATIONS: None immediate. PROCEDURE: The procedure, risks, benefits, and alternatives were explained to the patient. Questions regarding the procedure were encouraged and answered. The patient understands and consents to the procedure. The right neck and chest were prepped with chlorhexidine in a sterile fashion, and a sterile drape was applied covering the operative field. Maximum barrier sterile technique with sterile gowns and gloves were used for the procedure. A timeout  was performed prior to the initiation of the procedure. Local anesthesia was provided with 1% lidocaine with epinephrine. After creating a small venotomy incision, a micropuncture kit was utilized to access the internal jugular vein. Real-time ultrasound guidance was utilized for vascular access including the acquisition of a permanent ultrasound image documenting patency of the accessed vessel. The microwire was utilized to measure appropriate catheter length. A subcutaneous port pocket was then created along the upper chest wall utilizing a combination of sharp and blunt dissection. The pocket was irrigated with sterile  saline. A single lumen "Slim" sized power injectable port was chosen for placement. The 8 Fr catheter was tunneled from the port pocket site to the venotomy incision. The port was placed in the pocket. The external catheter was trimmed to appropriate length. At the venotomy, an 8 Fr peel-away sheath was placed over a guidewire under fluoroscopic guidance. The catheter was then placed through the sheath and the sheath was removed. Final catheter positioning was confirmed and documented with a fluoroscopic spot radiograph. The port was accessed with a Huber needle, aspirated and flushed with heparinized saline. The venotomy site was closed with an interrupted 4-0 Vicryl suture. The port pocket incision was closed with interrupted 2-0 Vicryl suture. The skin was opposed with a running subcuticular 4-0 Vicryl suture. Dermabond and Steri-strips were applied to both incisions. Dressings were applied. The patient tolerated the procedure well without immediate post procedural complication. FINDINGS: After catheter placement, the tip lies within the superior cavoatrial junction. The catheter aspirates and flushes normally and is ready for immediate use. The port a Catheter was left accessed for the patient's impending hospitalization and initiation of chemotherapy. IMPRESSION: Successful placement of a right internal jugular approach power injectable Port-A-Cath. The catheter is ready for immediate use. Electronically Signed   By: Sandi Mariscal M.D.   On: 05/28/2020 10:55   Assessment and Plan:   1.  Stage IVa diffuse large B-cell lymphoma, ABC subtype 2.  Fatigue secondary #1 3.  Anemia secondary to recent chemotherapy 4.  Recent significant weight loss, resolved  -Labs from today been reviewed.  He has mild anemia which is overall stable and mild thrombocytopenia.  Counts are adequate to proceed with day 1 of cycle #6 of his chemotherapy as planned.  We will watch his platelet count very closely.  We will check a  daily CBC with differential and CMET. -He is also at high risk for CNS involvement due to high risk features and will plan to administer intrathecal methotrexate with this cycle.  Will ask for radiology to administer this on day 2 of the cycle of chemotherapy. -Aspirin has been placed on hold in anticipation of intrathecal methotrexate.  Prophylactic Lovenox has not been ordered due to impending procedure.  Will reevaluate post procedure.  SCDs for DVT prophylaxis. -We will continue home bowel regimen including MiraLAX and Senokot-S. -He has Zofran and Compazine as needed for nausea and vomiting. -Continue allopurinol and other home medications. -Sodium bicarbonate mouth rinses and Magic mouthwash have been ordered.  John Bussing, DNP, AGPCNP-BC, AOCNP  Addendum  I have seen the patient, examined him. I agree with the assessment and and plan and have edited the notes.   John [name] is doing very well, lab reviewed, thrombocytopenia improved from last week, adequate for treatment, will proceed C6D1 chemo today. Medication list reviewed.  I ordered the intrathecal Chemotherapy by radiology tomorrow, and the patient will be n.p.o. after midnight for procedure tomorrow. I encouraged him to remain  active. Will monitor closely when he is on chemo.   Truitt Merle  08/20/2020

## 2020-08-20 NOTE — Telephone Encounter (Signed)
Scheduled follow-up appointments per 3/11 los. Patient's wife is aware.

## 2020-08-21 ENCOUNTER — Inpatient Hospital Stay (HOSPITAL_COMMUNITY): Payer: Medicare HMO

## 2020-08-21 DIAGNOSIS — C8338 Diffuse large B-cell lymphoma, lymph nodes of multiple sites: Secondary | ICD-10-CM | POA: Diagnosis not present

## 2020-08-21 LAB — CSF CELL COUNT WITH DIFFERENTIAL
RBC Count, CSF: 1020 /mm3 — ABNORMAL HIGH
Tube #: 4
WBC, CSF: 1 /mm3 (ref 0–5)

## 2020-08-21 LAB — CBC WITH DIFFERENTIAL/PLATELET
Abs Immature Granulocytes: 0.05 10*3/uL (ref 0.00–0.07)
Basophils Absolute: 0 10*3/uL (ref 0.0–0.1)
Basophils Relative: 0 %
Eosinophils Absolute: 0 10*3/uL (ref 0.0–0.5)
Eosinophils Relative: 0 %
HCT: 28.3 % — ABNORMAL LOW (ref 39.0–52.0)
Hemoglobin: 9.2 g/dL — ABNORMAL LOW (ref 13.0–17.0)
Immature Granulocytes: 1 %
Lymphocytes Relative: 4 %
Lymphs Abs: 0.3 10*3/uL — ABNORMAL LOW (ref 0.7–4.0)
MCH: 30.7 pg (ref 26.0–34.0)
MCHC: 32.5 g/dL (ref 30.0–36.0)
MCV: 94.3 fL (ref 80.0–100.0)
Monocytes Absolute: 0.6 10*3/uL (ref 0.1–1.0)
Monocytes Relative: 8 %
Neutro Abs: 6.1 10*3/uL (ref 1.7–7.7)
Neutrophils Relative %: 87 %
Platelets: 96 10*3/uL — ABNORMAL LOW (ref 150–400)
RBC: 3 MIL/uL — ABNORMAL LOW (ref 4.22–5.81)
RDW: 17.1 % — ABNORMAL HIGH (ref 11.5–15.5)
WBC: 7.1 10*3/uL (ref 4.0–10.5)
nRBC: 0.3 % — ABNORMAL HIGH (ref 0.0–0.2)

## 2020-08-21 LAB — COMPREHENSIVE METABOLIC PANEL
ALT: 19 U/L (ref 0–44)
AST: 22 U/L (ref 15–41)
Albumin: 3.3 g/dL — ABNORMAL LOW (ref 3.5–5.0)
Alkaline Phosphatase: 55 U/L (ref 38–126)
Anion gap: 6 (ref 5–15)
BUN: 17 mg/dL (ref 8–23)
CO2: 24 mmol/L (ref 22–32)
Calcium: 8.8 mg/dL — ABNORMAL LOW (ref 8.9–10.3)
Chloride: 111 mmol/L (ref 98–111)
Creatinine, Ser: 0.84 mg/dL (ref 0.61–1.24)
GFR, Estimated: >60 mL/min (ref >60)
Glucose, Bld: 121 mg/dL — ABNORMAL HIGH (ref 70–99)
Potassium: 3.7 mmol/L (ref 3.5–5.1)
Sodium: 141 mmol/L (ref 135–145)
Total Bilirubin: 0.6 mg/dL (ref 0.3–1.2)
Total Protein: 5.7 g/dL — ABNORMAL LOW (ref 6.5–8.1)

## 2020-08-21 MED ORDER — SODIUM CHLORIDE 0.9 % IV SOLN
Freq: Once | INTRAVENOUS | Status: AC
  Administered 2020-08-21: 8 mg via INTRAVENOUS
  Filled 2020-08-21: qty 4

## 2020-08-21 MED ORDER — SENNOSIDES-DOCUSATE SODIUM 8.6-50 MG PO TABS
2.0000 | ORAL_TABLET | Freq: Two times a day (BID) | ORAL | Status: DC
  Administered 2020-08-21 – 2020-08-24 (×7): 2 via ORAL
  Filled 2020-08-21 (×7): qty 2

## 2020-08-21 MED ORDER — SODIUM CHLORIDE (PF) 0.9 % IJ SOLN
Freq: Once | INTRAMUSCULAR | Status: AC
  Filled 2020-08-21 (×2): qty 0.48

## 2020-08-21 MED ORDER — ETOPOSIDE CHEMO INJECTION 500 MG/25ML
Freq: Once | INTRAVENOUS | Status: AC
  Filled 2020-08-21: qty 8

## 2020-08-21 NOTE — Progress Notes (Signed)
Ok to proceed w/ Day 2 of chemo despite Pltc = 96 per Dr. Burr Medico.  Kennith Center, Pharm.D., CPP 08/21/2020@8 :52 AM

## 2020-08-21 NOTE — Progress Notes (Addendum)
HEMATOLOGY-ONCOLOGY PROGRESS NOTE  SUBJECTIVE: John [name] tolerated day 1 of cycle 6 of his chemotherapy well overall.  He is not having any problems with mucositis, chest pain, shortness of breath, abdominal pain, nausea, vomiting.  No bleeding reported.  Currently n.p.o. in anticipation of intrathecal methotrexate later today.  He is ambulating in the hallway without any difficulty.  Oncology History Overview Note  Cancer Staging Diffuse large B cell lymphoma ([place]) Staging form: Hodgkin and Non-Hodgkin Lymphoma, AJCC 8th Edition - Clinical stage from 04/23/2020: Stage IV (Diffuse large B-cell lymphoma) - Signed by Truitt Merle, MD on 04/29/2020    Diffuse large B cell lymphoma ([place])  04/20/2020 Imaging   CT CAP  IMPRESSION: 1. Large anterior mediastinal/prevascular space mass/adenopathy. The mass extends superiorly and abuts the upper trachea. 2. Large hypodense lesion in the right lobe of the liver as well as ill-defined splenic hypodense lesions. Further characterization with MRI without and with contrast recommended. The liver lesion is amenable to percutaneous tissue sampling. 3. Extensive mesenteric and retroperitoneal adenopathy. 4. Sigmoid diverticulosis. No bowel obstruction. Normal appendix. 5. Aortic Atherosclerosis (ICD10-I70.0).   04/23/2020 Cancer Staging   Staging form: Hodgkin and Non-Hodgkin Lymphoma, AJCC 8th Edition - Clinical stage from 04/23/2020: Stage IV (Diffuse large B-cell lymphoma) - Signed by Truitt Merle, MD on 04/29/2020   04/23/2020 Initial Biopsy   FINAL MICROSCOPIC DIAGNOSIS:   A. LIVER, RIGHT MASS, NEEDLE CORE BIOPSY:  - Large B-cell lymphoma  - See comment     COMMENT:   The sections show needle core biopsy fragments displaying effacement of  the architecture by a dense infiltrate of primarily large atypical  lymphoid appearing cells displaying vesicular chromatin and small  nucleoli.  This is associated with apoptosis and brisk mitosis.  The   appearance is diffuse with lack of atypical follicles.  A large battery  of immunohistochemical stains was performed and shows that the atypical  lymphoid cells are positive for LCA, CD20, PAX 5, BCL-2, BCL6, CD5,  MUM-1 (partial).  The atypical lymphoid cells are negative for CD10,  CD30, CD34, CD138, cyclin D1, TdT, and EBV in situ hybridization,  synaptophysin, cytokeratin AE1/AE3, cytokeratin 20, cytokeratin 7,  cytokeratin 5/6, TTF-1, CD56, CDX2, and p40.  There is an admixed  relatively minor population of T-cells as primarily seen with CD3.  The  overall features are consistent with large B-cell lymphoma, ABC subtype.  The lymphomatous process displays expression of CD5.  This phenotype is  seen in 5-10% of mostly de novo large B-cell lymphoma cases or rarely as  a transformation of a low-grade B-cell lymphoma such as small  lymphocytic lymphoma.  Clinical correlation is recommended.    04/25/2020 Imaging   CT AP  IMPRESSION: 1. Development of high-density ascites in the abdomen and pelvis. Findings are most compatible with a small to moderate amount of hemoperitoneum and related to the recent liver biopsy. 2. Extensive lymphadenopathy throughout the abdomen and pelvis with a large mass or nodal mass near the pancreatic head. There are additional lesions involving the liver and spleen. Findings are suggestive for metastatic disease. 3. Development of small bilateral pleural effusions.   These results were called by telephone at the time of interpretation on 04/25/2020 at 1:43 pm to provider ERIC [place] who verbally acknowledged these results.   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 Marked reduction in volume of anterior mediastinal mass now with low metabolic  activity ( [place] 2). 2. Reduction in volume of retroperitoneal nodal mass adjacent the pancreas with low metabolic activity ( [place] 3). 3. No increased metabolic activity associated with the spleen 4. Interval contraction of hepatic hematoma post biopsy. No hypermetabolic elements within the hepatic lesion. 5. Grade 2 anterolisthesis L4 on L5.        REVIEW OF SYSTEMS:   Constitutional: Denies fevers, chills or abnormal weight loss Eyes: Denies blurriness of vision Ears, nose, mouth, throat, and face: Denies mucositis or sore throat Respiratory: Denies cough, dyspnea or wheezes Cardiovascular: Denies palpitation, chest discomfort Gastrointestinal:  Denies nausea, heartburn or change in bowel habits Skin: Denies abnormal skin rashes Lymphatics: Denies new lymphadenopathy or easy bruising Neurological:Denies numbness, tingling or new weaknesses Behavioral/Psych: Mood is stable, no new changes  Extremities: No lower extremity edema All other systems were reviewed with the patient and are negative.  I have reviewed the past medical history, past surgical history, social history and family history with the patient and they are unchanged from previous note.   PHYSICAL EXAMINATION: ECOG PERFORMANCE STATUS: 1 - Symptomatic but completely ambulatory  Vitals:   08/20/20 2052 08/21/20 0601  BP: (!) 144/78 138/69  Pulse: (!) 101 79  Resp: 20 14  Temp: 97.9 F (36.6 C) 97.7 F (36.5 C)  SpO2: 94% 94%   There were no vitals filed for this visit.  Intake/Output from previous day: 03/14 0701 - 03/15 0700 In: 1060.7 [I.V.:305; IV Piggyback:755.7] Out: -   GENERAL:alert, no distress and comfortable SKIN: skin color, texture, turgor are normal, no rashes or significant lesions EYES: normal, Conjunctiva are pink and non-injected, sclera clear OROPHARYNX:no exudate, no erythema and lips, buccal mucosa, and tongue normal  LUNGS: clear to auscultation and percussion with normal  breathing effort HEART: regular rate & rhythm and no murmurs and no lower extremity edema ABDOMEN:abdomen soft, non-tender and normal bowel sounds Musculoskeletal:no cyanosis of digits and no clubbing  NEURO: alert & oriented x 3 with fluent speech, no focal motor/sensory deficits  LABORATORY DATA:  I have reviewed the data as listed CMP Latest Ref Rng & Units 08/21/2020 08/20/2020 08/17/2020  Glucose 70 - 99 mg/dL 121(H) 138(H) 121(H)  BUN 8 - 23 mg/dL 17 16 14   Creatinine 0.61 - 1.24 mg/dL 0.84 0.76 0.85  Sodium 135 - 145 mmol/L 141 140 144  Potassium 3.5 - 5.1 mmol/L 3.7 3.5 3.8  Chloride 98 - 111 mmol/L 111 108 110  CO2 22 - 32 mmol/L 24 18(L) 24  Calcium 8.9 - 10.3 mg/dL 8.8(L) 8.5(L) 8.4(L)  Total Protein 6.5 - 8.1 g/dL 5.7(L) 5.7(L) 6.0(L)  Total Bilirubin 0.3 - 1.2 mg/dL 0.6 0.8 0.4  Alkaline Phos 38 - 126 U/L 55 61 77  AST 15 - 41 U/L 22 24 22   ALT 0 - 44 U/L 19 21 19     Lab Results  Component Value Date   WBC 7.1 08/21/2020   HGB 9.2 (L) 08/21/2020   HCT 28.3 (L) 08/21/2020   MCV 94.3 08/21/2020   PLT 96 (L) 08/21/2020   NEUTROABS 6.1 08/21/2020    DG FLUORO GUIDED LOC OF NEEDLE/CATH TIP FOR SPINAL INJECT LT  Result Date: 07/31/2020 CLINICAL DATA:  Diffuse large B-cell lymphoma. Intrathecal chemotherapy. EXAM: DIAGNOSTIC LUMBAR PUNCTURE UNDER FLUOROSCOPIC GUIDANCE FLUOROSCOPY TIME:  Fluoroscopy Time:  2 minutes Radiation Exposure Index (if provided by the fluoroscopic device): 13.9  mGy Number of Acquired Spot Images: 1 PROCEDURE: Informed consent was obtained from the patient prior to the procedure, including potential complications of headache, allergy, and pain. With the patient prone, the lower back was prepped with Betadine. 1% Lidocaine was used for local anesthesia. Lumbar puncture was performed at the L3-4 level using a 20 gauge needle with return of clear CSF with an opening pressure of 12 cm water. Five ml of CSF were initially removed. Next, Methotrexate solution  was injected in subarachnoid space without Complication. The patient tolerated the procedure well and there were no apparent complications. IMPRESSION: Successful intrathecal methotrexate injections in fluoroscopic guidance. Electronically Signed   By: Kerby Moors M.D.   On: 07/31/2020 15:47    ASSESSMENT AND PLAN: 1.  Stage IVa diffuse large B-cell lymphoma, ABC subtype, s/p 3 cycle R-EPOCH with excellent response  2.  Fatigue secondary #1 3.  Anemia secondary to recent chemotherapy 4. HTN 5. Drug induced constipation   -He is clinically doing well, tolerated day 1 of cycle 6 of his chemotherapy well overall. -Labs from this morning have been reviewed and remained stable.  He has mild anemia and thrombocytopenia which we will continue to watch.  Continue to monitor daily CBC with differential and CMET. -He is also at high risk for CNS involvement due to high risk features.  Order has been placed for intrathecal methotrexate to be administered later today. -Aspirin has been placed on hold in anticipation of intrathecal methotrexate.  Prophylactic Lovenox has not been ordered due to impending procedure.  Will reevaluate post procedure.  SCDs for DVT prophylaxis. -We will continue home bowel regimen including Miralax and Senokot. -He has Zofran and Compazine as needed for nausea and vomiting. -Continue allopurinol and other home medications. -Sodium bicarbonate mouth rinses and Magic mouthwash PRN have been ordered. -Rituxan and Neulasta are scheduled for 08/27/2020.  Labs are scheduled for 08/30/2020 and labs and follow-up visit are scheduled for 09/07/2020.      LOS: 1 day   Mikey Bussing, DNP, AGPCNP-BC, AOCNP 08/21/20   Addendum  I have seen the patient, examined him. I agree with the assessment and and plan and have edited the notes.   Mr. Willmore is tolerating chemotherapy very well, lab reviewed, mild anemia and thrombocytopenia, adequate for treatment, will continue day 2  chemotherapy at same dose. He completed LP and intrathecal chemo around noontime, no complaints of headache or other signs.  I will send his CSF for cell counts and cytology. I spoke with his wife also.   Truitt Merle  08/21/2020

## 2020-08-21 NOTE — Procedures (Signed)
Fluoro guide lumbar puncture performed at L2-L3.  Opening pressure 19 cm H2O.  6 ml clear CSF obtained.  12 mg Methorexate adminstered intrathecally.  No complications.   See full dictation in PACS for additional details.

## 2020-08-22 DIAGNOSIS — C8338 Diffuse large B-cell lymphoma, lymph nodes of multiple sites: Secondary | ICD-10-CM | POA: Diagnosis not present

## 2020-08-22 LAB — CBC WITH DIFFERENTIAL/PLATELET
Abs Immature Granulocytes: 0.06 10*3/uL (ref 0.00–0.07)
Basophils Absolute: 0 10*3/uL (ref 0.0–0.1)
Basophils Relative: 0 %
Eosinophils Absolute: 0 10*3/uL (ref 0.0–0.5)
Eosinophils Relative: 0 %
HCT: 27.9 % — ABNORMAL LOW (ref 39.0–52.0)
Hemoglobin: 8.9 g/dL — ABNORMAL LOW (ref 13.0–17.0)
Immature Granulocytes: 1 %
Lymphocytes Relative: 4 %
Lymphs Abs: 0.2 10*3/uL — ABNORMAL LOW (ref 0.7–4.0)
MCH: 30.5 pg (ref 26.0–34.0)
MCHC: 31.9 g/dL (ref 30.0–36.0)
MCV: 95.5 fL (ref 80.0–100.0)
Monocytes Absolute: 0.5 10*3/uL (ref 0.1–1.0)
Monocytes Relative: 9 %
Neutro Abs: 5.3 10*3/uL (ref 1.7–7.7)
Neutrophils Relative %: 86 %
Platelets: 88 10*3/uL — ABNORMAL LOW (ref 150–400)
RBC: 2.92 MIL/uL — ABNORMAL LOW (ref 4.22–5.81)
RDW: 17.4 % — ABNORMAL HIGH (ref 11.5–15.5)
WBC: 6.1 10*3/uL (ref 4.0–10.5)
nRBC: 0 % (ref 0.0–0.2)

## 2020-08-22 LAB — COMPREHENSIVE METABOLIC PANEL
ALT: 20 U/L (ref 0–44)
AST: 22 U/L (ref 15–41)
Albumin: 3.2 g/dL — ABNORMAL LOW (ref 3.5–5.0)
Alkaline Phosphatase: 54 U/L (ref 38–126)
Anion gap: 6 (ref 5–15)
BUN: 25 mg/dL — ABNORMAL HIGH (ref 8–23)
CO2: 23 mmol/L (ref 22–32)
Calcium: 8.4 mg/dL — ABNORMAL LOW (ref 8.9–10.3)
Chloride: 111 mmol/L (ref 98–111)
Creatinine, Ser: 0.78 mg/dL (ref 0.61–1.24)
GFR, Estimated: >60 mL/min (ref >60)
Glucose, Bld: 117 mg/dL — ABNORMAL HIGH (ref 70–99)
Potassium: 3.6 mmol/L (ref 3.5–5.1)
Sodium: 140 mmol/L (ref 135–145)
Total Bilirubin: 0.9 mg/dL (ref 0.3–1.2)
Total Protein: 5.4 g/dL — ABNORMAL LOW (ref 6.5–8.1)

## 2020-08-22 LAB — CYTOLOGY - NON PAP

## 2020-08-22 MED ORDER — VINCRISTINE SULFATE CHEMO INJECTION 1 MG/ML
Freq: Once | INTRAVENOUS | Status: AC
  Filled 2020-08-22: qty 8

## 2020-08-22 MED ORDER — SODIUM CHLORIDE 0.9 % IV SOLN
Freq: Once | INTRAVENOUS | Status: AC
  Administered 2020-08-22: 8 mg via INTRAVENOUS
  Filled 2020-08-22: qty 4

## 2020-08-22 NOTE — TOC Initial Note (Signed)
Transition of Care [facility]) - Initial/Assessment Note    Patient Details  Name: John [name] MRN: [id number] Date of Birth: [birth year]  Transition of [facility]) CM/SW Contact:    Lynnell Catalan, RN Phone Number: 08/22/2020, 11:57 AM  Clinical Narrative:                 Expected Discharge Plan: Home/Self Care Barriers to Discharge: Continued Medical Work up  Expected Discharge Plan and Services Expected Discharge Plan: Home/Self Care       Living arrangements for the past 2 months: Single Family Home Expected Discharge Date:  (unknown)                   Prior Living Arrangements/Services Living arrangements for the past 2 months: Single Family Home Lives with:: Spouse          Need for Family Participation in Patient Care: Yes (Comment) Care giver support system in place?: Yes (comment)   Criminal Activity/Legal Involvement Pertinent to Current Situation/Hospitalization: No - Comment as needed  Activities of Daily Living Home Assistive Devices/Equipment: Eyeglasses ADL Screening (condition at time of admission) Patient's cognitive ability adequate to safely complete daily activities?: Yes Is the patient deaf or have difficulty hearing?: No Does the patient have difficulty seeing, even when wearing glasses/contacts?: No Does the patient have difficulty concentrating, remembering, or making decisions?: No Patient able to express need for assistance with ADLs?: Yes Does the patient have difficulty dressing or bathing?: No Independently performs ADLs?: Yes (appropriate for developmental age) Does the patient have difficulty walking or climbing stairs?: No Weakness of Legs: None Weakness of Arms/Hands: None  Permission Sought/Granted                  Emotional Assessment Appearance:: Appears stated age Attitude/Demeanor/Rapport: Gracious Affect (typically observed): Calm Orientation: : Oriented to Self,Oriented to Place,Oriented to  Time,Oriented to  Situation Alcohol / Substance Use: Not Applicable    Admission diagnosis:  Diffuse large b-cell lymphoma, lymph nodes of multiple sites [facility]) [C83.38] Patient Active Problem List   Diagnosis Date Noted  . Diffuse large b-cell lymphoma, lymph nodes of multiple sites ([place]) 08/20/2020  . Diffuse large B-cell lymphoma of lymph nodes of multiple sites ([place]) 07/30/2020  . Diffuse high grade B-cell lymphoma ([place]) 07/09/2020  . Drug-induced constipation   . Antineoplastic chemotherapy induced anemia 06/07/2020  . Malnutrition of moderate degree 06/01/2020  . Encounter for antineoplastic chemotherapy 05/28/2020  . Mucositis   . Febrile neutropenia ([place]) 05/12/2020  . Sepsis ([place]) 05/12/2020  . Antineoplastic chemotherapy induced pancytopenia ([place]) 05/12/2020  . Goals of care, counseling/discussion 05/06/2020  . Diffuse large B cell lymphoma ([place]) 04/26/2020  . Tumor lysis syndrome 04/24/2020  . AKI (acute kidney injury) ([place]) 04/21/2020  . 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 Liver mass 04/21/2020  . Hypercalcemia 04/20/2020  . Actinic keratoses 05/15/2019  . 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 Encounter for general adult medical examination without abnormal findings 01/17/2016  . Hypertension 01/08/2016  . Benign prostatic hyperplasia with urinary frequency 01/08/2016  . Impaired fasting glucose 01/08/2016  . Obesity 01/08/2016  . Obstructive sleep apnea 01/08/2016  . Osteoarthritis 01/08/2016  . Tinnitus 01/08/2016  . Knee pain 09/22/2013   PCP:  Christain [place], MD Pharmacy:   [facility] [address] [place] [address] AT [address] [place] [address] Phone: [phone] Fax: [phone]     Social Determinants of Health (SDOH)  Interventions    Readmission Risk Interventions Readmission Risk Prevention Plan 08/22/2020  Transportation Screening Complete  Medication Review Press photographer) Complete  PCP or  Specialist appointment within 3-5 days of discharge Complete  HRI or [place] Complete  SW Recovery Care/Counseling Consult Complete  [place] Not Applicable  Some recent data might be hidden

## 2020-08-22 NOTE — Progress Notes (Signed)
Chemotherapy dosages and calculations verified with [facility].

## 2020-08-22 NOTE — Progress Notes (Signed)
John [name]   DOB:1945-08-08   KX#:[id number]   CSN#:[id number]  Oncology follow up   Subjective: Pt is doing well, denies any pain, nausea or other new symptoms. No complains after LP yesterday.  He slept well last night, and has good BM. No bleeding.    Objective:  Vitals:   08/21/20 2040 08/22/20 0529  BP: 121/74 123/75  Pulse: 88 85  Resp: 16 16  Temp: 97.9 F (36.6 C) (!) 97.5 F (36.4 C)  SpO2: 98% 97%    There is no height or weight on file to calculate BMI.  Intake/Output Summary (Last 24 hours) at 08/22/2020 0746 Last data filed at 08/21/2020 1743 Gross per 24 hour  Intake 480 ml  Output --  Net 480 ml     Sclerae unicteric  Oropharynx clear  No peripheral adenopathy  Lungs clear -- no rales or rhonchi  Heart regular rate and rhythm  Abdomen benign  MSK no focal spinal tenderness, no peripheral edema, mild pitting edema on low legs  Neuro nonfocal    CBG (last 3)  No results for input(s): GLUCAP in the last 72 hours.   Labs:  Urine Studies No results for input(s): UHGB, CRYS in the last 72 hours.  Invalid input(s): UACOL, UAPR, USPG, UPH, UTP, UGL, UKET, UBIL, UNIT, UROB, [place], UEPI, UWBC, Duwayne Heck [place], [place]  Basic Metabolic Panel: Recent Labs  Lab 08/17/20 1456 08/20/20 0848 08/21/20 0612 08/22/20 0541  NA 144 140 141 140  K 3.8 3.5 3.7 3.6  CL 110 108 111 111  CO2 24 18* 24 23  GLUCOSE 121* 138* 121* 117*  BUN 14 16 17  25*  CREATININE 0.85 0.76 0.84 0.78  CALCIUM 8.4* 8.5* 8.8* 8.4*   GFR Estimated Creatinine Clearance: 98.6 mL/min (by C-G formula based on SCr of 0.78 mg/dL). Liver Function Tests: Recent Labs  Lab 08/17/20 1456 08/20/20 0848 08/21/20 0612 08/22/20 0541  AST 22 24 22 22   ALT 19 21 19 20   ALKPHOS 77 61 55 54  BILITOT 0.4 0.8 0.6 0.9  PROT 6.0* 5.7* 5.7* 5.4*  ALBUMIN 3.3* 3.4* 3.3* 3.2*   No results for input(s): LIPASE, AMYLASE in the last 168 hours. No results for input(s): AMMONIA in the last 168  hours. Coagulation profile No results for input(s): INR, PROTIME in the last 168 hours.  CBC: Recent Labs  Lab 08/17/20 1456 08/20/20 0848 08/21/20 0612 08/22/20 0541  WBC 5.7 5.3 7.1 6.1  NEUTROABS 4.3 4.3 6.1 5.3  HGB 9.4* 9.4* 9.2* 8.9*  HCT 28.2* 28.7* 28.3* 27.9*  MCV 92.2 93.2 94.3 95.5  PLT 96* 107* 96* 88*   Cardiac Enzymes: No results for input(s): CKTOTAL, CKMB, CKMBINDEX, TROPONINI in the last 168 hours. BNP: Invalid input(s): POCBNP CBG: No results for input(s): GLUCAP in the last 168 hours. D-Dimer No results for input(s): DDIMER in the last 72 hours. Hgb A1c No results for input(s): HGBA1C in the last 72 hours. Lipid Profile No results for input(s): CHOL, HDL, LDLCALC, TRIG, CHOLHDL, LDLDIRECT in the last 72 hours. Thyroid function studies No results for input(s): TSH, T4TOTAL, T3FREE, THYROIDAB in the last 72 hours.  Invalid input(s): FREET3 Anemia work up No results for input(s): VITAMINB12, FOLATE, FERRITIN, TIBC, IRON, RETICCTPCT in the last 72 hours. Microbiology Recent Results (from the past 240 hour(s))  SARS CORONAVIRUS 2 (TAT 6-24 HRS) Nasopharyngeal Nasopharyngeal Swab     Status: None   Collection Time: 08/18/20  1:42 PM   Specimen: Nasopharyngeal Swab  Result Value Ref Range Status   SARS Coronavirus 2 NEGATIVE NEGATIVE Final    Comment: (NOTE) SARS-CoV-2 target nucleic acids are NOT DETECTED.  The SARS-CoV-2 RNA is generally detectable in upper and lower respiratory specimens during the acute phase of infection. Negative results do not preclude SARS-CoV-2 infection, do not rule out co-infections with other pathogens, and should not be used as the sole basis for treatment or other patient management decisions. Negative results must be combined with clinical observations, patient history, and epidemiological information. The expected result is Negative.  Fact Sheet for Patients: SugarRoll.be  Fact Sheet  for Healthcare Providers: https://www.woods-mathews.com/  This test is not yet approved or cleared by the [place] FDA and  has been authorized for detection and/or diagnosis of SARS-CoV-2 by FDA under an Emergency Use Authorization (EUA). This EUA will remain  in effect (meaning this test can be used) for the duration of the COVID-19 declaration under Se ction 564(b)(1) of the Act, 21 U.S.C. section 360bbb-3(b)(1), unless the authorization is terminated or revoked sooner.  Performed at [facility], [place] [address], [place],  [address]       Studies:  DG FLUORO GUIDED LOC OF NEEDLE/CATH TIP FOR SPINAL INJECT LT  Result Date: 08/21/2020 CLINICAL DATA:  75 year old male with history of lymphoma. EXAM: FLUOROSCOPICALLY GUIDED LUMBAR PUNCTURE FOR INTRATHECAL CHEMOTHERAPY TECHNIQUE: Informed consent was obtained from the patient prior to the procedure, including potential complications of headache, allergy, and pain. A 'time out' was performed. With the patient prone, the lower back was prepped with Betadine. 1% Lidocaine was used for local anesthesia. Lumbar puncture was performed at the L2-L3 using a 20 gauge needle with return of clear CSF. Opening pressure was 19 cm H2O. 6 mL of clear CSF was collected and sent to the laboratory for testing and analysis per order of the primary team. 12 mg of methotrexate was injected into the subarachnoid space. The patient tolerated the procedure well without apparent complication. FLUOROSCOPY TIME:  30 seconds IMPRESSION: Intrathecal injection of chemotherapy without complication Electronically Signed   By: Vinnie Langton M.D.   On: 08/21/2020 14:11    Assessment: 75 y.o.  1. Stage IVa diffuse large B-cell lymphoma, ABC subtype, s/p 5 cycle R-EPOCH  2. Fatigue secondary #1 3. Anemia and thrombocytopenia secondary to recent chemotherapy 4. HTN 5. Drug induced constipation, controlled    Plan:  -lab reviewed, plt  88K, trending down, no bleeding. Anemia mild and stable. Adequate for treatment, plan to continue day 3 chemo at same dose  -will monitor CBC closely, may or may not reduce his last day chemo dose tomorrow depends on plt counts. I discussed with pt that he may need plt transfusion next week in clinic  -he is clinically doing well and tolerating chemo wo other issues -will hold on lovenox for DVT prophylaxis, and just use SCD  -continue monitoring    Truitt Merle, MD 08/22/2020  7:46 AM

## 2020-08-23 DIAGNOSIS — C8338 Diffuse large B-cell lymphoma, lymph nodes of multiple sites: Secondary | ICD-10-CM | POA: Diagnosis not present

## 2020-08-23 LAB — CBC WITH DIFFERENTIAL/PLATELET
Abs Immature Granulocytes: 0.03 10*3/uL (ref 0.00–0.07)
Basophils Absolute: 0 10*3/uL (ref 0.0–0.1)
Basophils Relative: 0 %
Eosinophils Absolute: 0 10*3/uL (ref 0.0–0.5)
Eosinophils Relative: 0 %
HCT: 26.8 % — ABNORMAL LOW (ref 39.0–52.0)
Hemoglobin: 8.7 g/dL — ABNORMAL LOW (ref 13.0–17.0)
Immature Granulocytes: 1 %
Lymphocytes Relative: 6 %
Lymphs Abs: 0.2 10*3/uL — ABNORMAL LOW (ref 0.7–4.0)
MCH: 30.6 pg (ref 26.0–34.0)
MCHC: 32.5 g/dL (ref 30.0–36.0)
MCV: 94.4 fL (ref 80.0–100.0)
Monocytes Absolute: 0.4 10*3/uL (ref 0.1–1.0)
Monocytes Relative: 10 %
Neutro Abs: 3.4 10*3/uL (ref 1.7–7.7)
Neutrophils Relative %: 83 %
Platelets: 70 10*3/uL — ABNORMAL LOW (ref 150–400)
RBC: 2.84 MIL/uL — ABNORMAL LOW (ref 4.22–5.81)
RDW: 17.1 % — ABNORMAL HIGH (ref 11.5–15.5)
WBC: 4.1 10*3/uL (ref 4.0–10.5)
nRBC: 0 % (ref 0.0–0.2)

## 2020-08-23 LAB — COMPREHENSIVE METABOLIC PANEL
ALT: 27 U/L (ref 0–44)
AST: 30 U/L (ref 15–41)
Albumin: 3.1 g/dL — ABNORMAL LOW (ref 3.5–5.0)
Alkaline Phosphatase: 51 U/L (ref 38–126)
Anion gap: 7 (ref 5–15)
BUN: 25 mg/dL — ABNORMAL HIGH (ref 8–23)
CO2: 24 mmol/L (ref 22–32)
Calcium: 8.8 mg/dL — ABNORMAL LOW (ref 8.9–10.3)
Chloride: 112 mmol/L — ABNORMAL HIGH (ref 98–111)
Creatinine, Ser: 0.78 mg/dL (ref 0.61–1.24)
GFR, Estimated: >60 mL/min (ref >60)
Glucose, Bld: 99 mg/dL (ref 70–99)
Potassium: 3.6 mmol/L (ref 3.5–5.1)
Sodium: 143 mmol/L (ref 135–145)
Total Bilirubin: 0.8 mg/dL (ref 0.3–1.2)
Total Protein: 5.2 g/dL — ABNORMAL LOW (ref 6.5–8.1)

## 2020-08-23 MED ORDER — ONDANSETRON HCL 40 MG/20ML IJ SOLN
Freq: Once | INTRAMUSCULAR | Status: AC
  Administered 2020-08-23: 18 mg via INTRAVENOUS
  Filled 2020-08-23: qty 4

## 2020-08-23 MED ORDER — VINCRISTINE SULFATE CHEMO INJECTION 1 MG/ML
Freq: Once | INTRAVENOUS | Status: AC
  Filled 2020-08-23: qty 8

## 2020-08-23 NOTE — Progress Notes (Signed)
Ok to tx with plt = 70 per Dr Burr Medico

## 2020-08-23 NOTE — Care Management Important Message (Signed)
Important Message  Patient Details IM Letter given to the Patient. Name: John [name] MRN: [id number] Date of Birth: 17-Nov-1945   Medicare Important Message Given:  Yes     Kerin Salen 08/23/2020, 10:15 AM

## 2020-08-23 NOTE — Progress Notes (Addendum)
HEMATOLOGY-ONCOLOGY PROGRESS NOTE  SUBJECTIVE: John [name] tolerated day 3 of cycle 6 of his chemotherapy well overall.  He is not having any problems with mucositis, chest pain, shortness of breath, abdominal pain, nausea, vomiting.  He noticed a spot of dried blood on his pillowcase this morning.  Unable to determine source of bleeding.  He denies epistaxis, hemoptysis, hematemesis, hematuria, melena, hematochezia.  He is ambulating in the hallway without any difficulty.  Oncology History Overview Note  Cancer Staging Diffuse large B cell lymphoma ([place]) Staging form: Hodgkin and Non-Hodgkin Lymphoma, AJCC 8th Edition - Clinical stage from 04/23/2020: Stage IV (Diffuse large B-cell lymphoma) - Signed by Truitt Merle, MD on 04/29/2020    Diffuse large B cell lymphoma ([place])  04/20/2020 Imaging   CT CAP  IMPRESSION: 1. Large anterior mediastinal/prevascular space mass/adenopathy. The mass extends superiorly and abuts the upper trachea. 2. Large hypodense lesion in the right lobe of the liver as well as ill-defined splenic hypodense lesions. Further characterization with MRI without and with contrast recommended. The liver lesion is amenable to percutaneous tissue sampling. 3. Extensive mesenteric and retroperitoneal adenopathy. 4. Sigmoid diverticulosis. No bowel obstruction. Normal appendix. 5. Aortic Atherosclerosis (ICD10-I70.0).   04/23/2020 Cancer Staging   Staging form: Hodgkin and Non-Hodgkin Lymphoma, AJCC 8th Edition - Clinical stage from 04/23/2020: Stage IV (Diffuse large B-cell lymphoma) - Signed by Truitt Merle, MD on 04/29/2020   04/23/2020 Initial Biopsy   FINAL MICROSCOPIC DIAGNOSIS:   A. LIVER, RIGHT MASS, NEEDLE CORE BIOPSY:  - Large B-cell lymphoma  - See comment     COMMENT:   The sections show needle core biopsy fragments displaying effacement of  the architecture by a dense infiltrate of primarily large atypical  lymphoid appearing cells displaying vesicular  chromatin and small  nucleoli.  This is associated with apoptosis and brisk mitosis.  The  appearance is diffuse with lack of atypical follicles.  A large battery  of immunohistochemical stains was performed and shows that the atypical  lymphoid cells are positive for LCA, CD20, PAX 5, BCL-2, BCL6, CD5,  MUM-1 (partial).  The atypical lymphoid cells are negative for CD10,  CD30, CD34, CD138, cyclin D1, TdT, and EBV in situ hybridization,  synaptophysin, cytokeratin AE1/AE3, cytokeratin 20, cytokeratin 7,  cytokeratin 5/6, TTF-1, CD56, CDX2, and p40.  There is an admixed  relatively minor population of T-cells as primarily seen with CD3.  The  overall features are consistent with large B-cell lymphoma, ABC subtype.  The lymphomatous process displays expression of CD5.  This phenotype is  seen in 5-10% of mostly de novo large B-cell lymphoma cases or rarely as  a transformation of a low-grade B-cell lymphoma such as small  lymphocytic lymphoma.  Clinical correlation is recommended.    04/25/2020 Imaging   CT AP  IMPRESSION: 1. Development of high-density ascites in the abdomen and pelvis. Findings are most compatible with a small to moderate amount of hemoperitoneum and related to the recent liver biopsy. 2. Extensive lymphadenopathy throughout the abdomen and pelvis with a large mass or nodal mass near the pancreatic head. There are additional lesions involving the liver and spleen. Findings are suggestive for metastatic disease. 3. Development of small bilateral pleural effusions.   These results were called by telephone at the time of interpretation on 04/25/2020 at 1:43 pm to provider ERIC [place] who verbally acknowledged these results.   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 Marked reduction in volume of anterior mediastinal mass now with low metabolic activity ( [place] 2). 2. Reduction in volume of retroperitoneal nodal mass adjacent the pancreas with low metabolic activity ( [place] 3). 3. No increased metabolic activity associated with the spleen 4. Interval contraction of hepatic hematoma post biopsy. No hypermetabolic elements within the hepatic lesion. 5. Grade 2 anterolisthesis L4 on L5.        REVIEW OF SYSTEMS:   Constitutional: Denies fevers, chills or abnormal weight loss Eyes: Denies blurriness of vision Ears, nose, mouth, throat, and face: Denies mucositis or sore throat Respiratory: Denies cough, dyspnea or wheezes Cardiovascular: Denies palpitation, chest discomfort Gastrointestinal:  Denies nausea, heartburn or change in bowel habits Skin: Denies abnormal skin rashes Lymphatics: Denies new lymphadenopathy or easy bruising Neurological:Denies numbness, tingling or new weaknesses Behavioral/Psych: Mood is stable, no new changes  Extremities: No lower extremity edema All other systems were reviewed with the patient and are negative.  I have reviewed the past medical history, past surgical history, social history and family history with the patient and they are unchanged from previous note.   PHYSICAL EXAMINATION: ECOG PERFORMANCE STATUS: 1 - Symptomatic but completely ambulatory  Vitals:   08/22/20 2010 08/23/20 0547  BP: 127/66 131/78  Pulse: 70 70  Resp: 16 16  Temp: 98.2 F (36.8 C) 97.7 F (36.5 C)  SpO2: 97% 97%   There were no vitals filed for this visit.  Intake/Output from previous day: 03/16 0701 - 03/17 0700 In: 2035 [I.V.:720; IV Piggyback:1315] Out: -   GENERAL:alert, no distress and comfortable SKIN: skin color, texture, turgor are normal, no rashes or significant lesions EYES: normal, Conjunctiva are pink and non-injected, sclera clear OROPHARYNX:no exudate, no erythema and lips, buccal  mucosa, and tongue normal  LUNGS: clear to auscultation and percussion with normal breathing effort HEART: regular rate & rhythm and no murmurs and no lower extremity edema ABDOMEN:abdomen soft, non-tender and normal bowel sounds Musculoskeletal:no cyanosis of digits and no clubbing  NEURO: alert & oriented x 3 with fluent speech, no focal motor/sensory deficits  LABORATORY DATA:  I have reviewed the data as listed CMP Latest Ref Rng & Units 08/23/2020 08/22/2020 08/21/2020  Glucose 70 - 99 mg/dL 99 117(H) 121(H)  BUN 8 - 23 mg/dL 25(H) 25(H) 17  Creatinine 0.61 - 1.24 mg/dL 0.78 0.78 0.84  Sodium 135 - 145 mmol/L 143 140 141  Potassium 3.5 - 5.1 mmol/L 3.6 3.6 3.7  Chloride 98 - 111 mmol/L 112(H) 111 111  CO2 22 - 32 mmol/L 24 23 24   Calcium 8.9 - 10.3 mg/dL 8.8(L) 8.4(L) 8.8(L)  Total Protein 6.5 - 8.1 g/dL 5.2(L) 5.4(L) 5.7(L)  Total Bilirubin 0.3 - 1.2 mg/dL 0.8 0.9 0.6  Alkaline Phos 38 - 126 U/L 51 54 55  AST 15 - 41 U/L 30 22 22   ALT 0 - 44 U/L 27 20 19     Lab Results  Component Value Date   WBC 4.1 08/23/2020   HGB 8.7 (L) 08/23/2020   HCT 26.8 (L) 08/23/2020   MCV 94.4 08/23/2020   PLT 70 (L) 08/23/2020   NEUTROABS 3.4 08/23/2020    DG FLUORO GUIDED LOC OF NEEDLE/CATH TIP FOR SPINAL INJECT LT  Result Date: 08/21/2020 CLINICAL DATA:  75 year old male with history of lymphoma. EXAM: FLUOROSCOPICALLY GUIDED LUMBAR PUNCTURE FOR INTRATHECAL CHEMOTHERAPY TECHNIQUE: Informed consent  was obtained from the patient prior to the procedure, including potential complications of headache, allergy, and pain. A 'time out' was performed. With the patient prone, the lower back was prepped with Betadine. 1% Lidocaine was used for local anesthesia. Lumbar puncture was performed at the L2-L3 using a 20 gauge needle with return of clear CSF. Opening pressure was 19 cm H2O. 6 mL of clear CSF was collected and sent to the laboratory for testing and analysis per order of the primary team. 12 mg of  methotrexate was injected into the subarachnoid space. The patient tolerated the procedure well without apparent complication. FLUOROSCOPY TIME:  30 seconds IMPRESSION: Intrathecal injection of chemotherapy without complication Electronically Signed   By: Vinnie Langton M.D.   On: 08/21/2020 14:11   DG FLUORO GUIDED LOC OF NEEDLE/CATH TIP FOR SPINAL INJECT LT  Result Date: 07/31/2020 CLINICAL DATA:  Diffuse large B-cell lymphoma. Intrathecal chemotherapy. EXAM: DIAGNOSTIC LUMBAR PUNCTURE UNDER FLUOROSCOPIC GUIDANCE FLUOROSCOPY TIME:  Fluoroscopy Time:  2 minutes Radiation Exposure Index (if provided by the fluoroscopic device): 13.9 mGy Number of Acquired Spot Images: 1 PROCEDURE: Informed consent was obtained from the patient prior to the procedure, including potential complications of headache, allergy, and pain. With the patient prone, the lower back was prepped with Betadine. 1% Lidocaine was used for local anesthesia. Lumbar puncture was performed at the L3-4 level using a 20 gauge needle with return of clear CSF with an opening pressure of 12 cm water. Five ml of CSF were initially removed. Next, Methotrexate solution was injected in subarachnoid space without Complication. The patient tolerated the procedure well and there were no apparent complications. IMPRESSION: Successful intrathecal methotrexate injections in fluoroscopic guidance. Electronically Signed   By: Kerby Moors M.D.   On: 07/31/2020 15:47    ASSESSMENT AND PLAN: 1.  Stage IVa diffuse large B-cell lymphoma, ABC subtype, s/p 3 cycle R-EPOCH with excellent response  2.  Fatigue secondary #1 3.  Anemia secondary to recent chemotherapy 4. HTN 5. Drug induced constipation   -He is clinically doing well, tolerated day 3 of cycle 6 of his chemotherapy well overall. -Labs from this morning have been reviewed.  Hemoglobin slowly drifting down but no need for transfusion support at this time.  Platelets are 70,000 this morning and  will watch closely.  Continue the same dose of chemotherapy.  Monitor closely for bleeding.  Continue to monitor daily CBC with differential and CMET. -He is also at high risk for CNS involvement due to high risk features.  Status post intrathecal methotrexate 08/21/2020.  Tolerated well. -We will continue to hold aspirin and Lovenox due to thrombocytopenia.  Continue ambulation and SCDs for DVT prophylaxis. -We will continue home bowel regimen including Miralax and Senokot. -He has Zofran and Compazine as needed for nausea and vomiting. -Continue allopurinol and other home medications. -Sodium bicarbonate mouth rinses and Magic mouthwash PRN have been ordered. -Rituxan and Neulasta are scheduled for 08/27/2020.  Labs are scheduled for 08/30/2020 and labs and follow-up visit are scheduled for 09/07/2020.   Will add lab appointment to 08/27/2020.    LOS: 3 days   Mikey Bussing, DNP, AGPCNP-BC, AOCNP 08/23/20   Addendum  I have seen the patient, examined him. I agree with the assessment and and plan and have edited the notes.   Mr. Crichlow is clinically doing well, no active bleeding or other new complains. Lab reviewed, plt continue trending down, but adequate for chemo, will proceed with same dose chemo today given it's  curative intent, he understands he may need plt transfusion next week. Will repet lab tomorrow morning.   Truitt Merle  08/23/2020

## 2020-08-24 DIAGNOSIS — C8338 Diffuse large B-cell lymphoma, lymph nodes of multiple sites: Secondary | ICD-10-CM | POA: Diagnosis not present

## 2020-08-24 LAB — CBC WITH DIFFERENTIAL/PLATELET
Abs Immature Granulocytes: 0.02 10*3/uL (ref 0.00–0.07)
Basophils Absolute: 0 10*3/uL (ref 0.0–0.1)
Basophils Relative: 0 %
Eosinophils Absolute: 0 10*3/uL (ref 0.0–0.5)
Eosinophils Relative: 0 %
HCT: 25.2 % — ABNORMAL LOW (ref 39.0–52.0)
Hemoglobin: 8.4 g/dL — ABNORMAL LOW (ref 13.0–17.0)
Immature Granulocytes: 1 %
Lymphocytes Relative: 7 %
Lymphs Abs: 0.2 10*3/uL — ABNORMAL LOW (ref 0.7–4.0)
MCH: 30.9 pg (ref 26.0–34.0)
MCHC: 33.3 g/dL (ref 30.0–36.0)
MCV: 92.6 fL (ref 80.0–100.0)
Monocytes Absolute: 0.2 10*3/uL (ref 0.1–1.0)
Monocytes Relative: 5 %
Neutro Abs: 3 10*3/uL (ref 1.7–7.7)
Neutrophils Relative %: 87 %
Platelets: 66 10*3/uL — ABNORMAL LOW (ref 150–400)
RBC: 2.72 MIL/uL — ABNORMAL LOW (ref 4.22–5.81)
RDW: 16.6 % — ABNORMAL HIGH (ref 11.5–15.5)
WBC: 3.4 10*3/uL — ABNORMAL LOW (ref 4.0–10.5)
nRBC: 0 % (ref 0.0–0.2)

## 2020-08-24 LAB — COMPREHENSIVE METABOLIC PANEL
ALT: 34 U/L (ref 0–44)
AST: 33 U/L (ref 15–41)
Albumin: 3 g/dL — ABNORMAL LOW (ref 3.5–5.0)
Alkaline Phosphatase: 48 U/L (ref 38–126)
Anion gap: 8 (ref 5–15)
BUN: 23 mg/dL (ref 8–23)
CO2: 24 mmol/L (ref 22–32)
Calcium: 8.5 mg/dL — ABNORMAL LOW (ref 8.9–10.3)
Chloride: 108 mmol/L (ref 98–111)
Creatinine, Ser: 0.79 mg/dL (ref 0.61–1.24)
GFR, Estimated: >60 mL/min (ref >60)
Glucose, Bld: 95 mg/dL (ref 70–99)
Potassium: 3.3 mmol/L — ABNORMAL LOW (ref 3.5–5.1)
Sodium: 140 mmol/L (ref 135–145)
Total Bilirubin: 1.1 mg/dL (ref 0.3–1.2)
Total Protein: 5 g/dL — ABNORMAL LOW (ref 6.5–8.1)

## 2020-08-24 MED ORDER — POTASSIUM CHLORIDE CRYS ER 20 MEQ PO TBCR
40.0000 meq | EXTENDED_RELEASE_TABLET | Freq: Once | ORAL | Status: AC
  Administered 2020-08-24: 40 meq via ORAL
  Filled 2020-08-24: qty 2

## 2020-08-24 MED ORDER — HEPARIN SOD (PORK) LOCK FLUSH 100 UNIT/ML IV SOLN: 500.0000 [IU] | INTRAVENOUS | Status: DC

## 2020-08-24 MED ORDER — POTASSIUM CHLORIDE CRYS ER 20 MEQ PO TBCR: 20.0000 meq | EXTENDED_RELEASE_TABLET | Freq: Once | ORAL | Status: DC

## 2020-08-24 MED ORDER — SODIUM CHLORIDE 0.9 % IV SOLN
600.0000 mg/m2 | Freq: Once | INTRAVENOUS | Status: AC
  Administered 2020-08-24: 1280 mg via INTRAVENOUS
  Filled 2020-08-24: qty 64

## 2020-08-24 MED ORDER — HEPARIN SOD (PORK) LOCK FLUSH 100 UNIT/ML IV SOLN
500.0000 [IU] | INTRAVENOUS | Status: DC | PRN
  Filled 2020-08-24: qty 5

## 2020-08-24 MED ORDER — SODIUM CHLORIDE 0.9 % IV SOLN
Freq: Once | INTRAVENOUS | Status: AC
  Administered 2020-08-24: 36 mg via INTRAVENOUS
  Filled 2020-08-24: qty 8

## 2020-08-24 NOTE — Discharge Summary (Addendum)
Discharge Summary  Patient ID: John [name] MRN: [id number] DOB/AGE: September 05, 1945 75 y.o.  Admit date: 08/20/2020 Discharge date: 08/24/2020  Discharge Diagnoses:  Active Problems:   Encounter for antineoplastic chemotherapy   Diffuse large B-cell lymphoma of lymph nodes of multiple sites ([place])   Diffuse large b-cell lymphoma, lymph nodes of multiple sites [facility])   Discharged Condition: good  Discharge Labs:   CBC    Component Value Date/Time   WBC 3.4 (L) 08/24/2020 0558   RBC 2.72 (L) 08/24/2020 0558   HGB 8.4 (L) 08/24/2020 0558   HGB 9.4 (L) 08/17/2020 1456   HCT 25.2 (L) 08/24/2020 0558   PLT 66 (L) 08/24/2020 0558   PLT 96 (L) 08/17/2020 1456   MCV 92.6 08/24/2020 0558   MCH 30.9 08/24/2020 0558   MCHC 33.3 08/24/2020 0558   RDW 16.6 (H) 08/24/2020 0558   LYMPHSABS 0.2 (L) 08/24/2020 0558   MONOABS 0.2 08/24/2020 0558   EOSABS 0.0 08/24/2020 0558   BASOSABS 0.0 08/24/2020 0558   CMP Latest Ref Rng & Units 08/24/2020 08/23/2020 08/22/2020  Glucose 70 - 99 mg/dL 95 99 117(H)  BUN 8 - 23 mg/dL 23 25(H) 25(H)  Creatinine 0.61 - 1.24 mg/dL 0.79 0.78 0.78  Sodium 135 - 145 mmol/L 140 143 140  Potassium 3.5 - 5.1 mmol/L 3.3(L) 3.6 3.6  Chloride 98 - 111 mmol/L 108 112(H) 111  CO2 22 - 32 mmol/L 24 24 23   Calcium 8.9 - 10.3 mg/dL 8.5(L) 8.8(L) 8.4(L)  Total Protein 6.5 - 8.1 g/dL 5.0(L) 5.2(L) 5.4(L)  Total Bilirubin 0.3 - 1.2 mg/dL 1.1 0.8 0.9  Alkaline Phos 38 - 126 U/L 48 51 54  AST 15 - 41 U/L 33 30 22  ALT 0 - 44 U/L 34 27 20    Consults: None  Procedures: 08/21/2020- fluoroscopic guided lumbar puncture and instillation of intrathecal methotrexate  Disposition:  Discharge disposition: 01-Home or Self Care      Allergies as of 08/24/2020   No Known Allergies     Medication List    STOP taking these medications   aspirin EC 81 MG tablet     TAKE these medications   allopurinol 100 MG tablet Commonly known as: ZYLOPRIM TAKE 1 TABLET(100 MG) BY  MOUTH DAILY What changed: See the new instructions.   BOOST MAX PROTEIN PO Take 237 mLs by mouth daily. Chocolate   doxazosin 4 MG tablet Commonly known as: CARDURA Take 4 mg by mouth daily.   lidocaine-prilocaine cream Commonly known as: EMLA Apply 1 application topically as needed. What changed:   when to take this  reasons to take this   magic mouthwash w/lidocaine Soln Take 15 mLs by mouth 4 (four) times daily as needed for mouth pain.   melatonin 3 MG Tabs tablet Take 2 tablets (6 mg total) by mouth at bedtime as needed (sleep). What changed: when to take this   metoprolol tartrate 25 MG tablet Commonly known as: LOPRESSOR Take 0.5 tablets (12.5 mg total) by mouth 2 (two) times daily. What changed: when to take this   MULTIVITAMIN PO Take 1 tablet by mouth daily.   OMEGA 3 PO Take 1 tablet by mouth daily.   ondansetron 8 MG tablet Commonly known as: ZOFRAN Take 1 tablet (8 mg total) by mouth every 8 (eight) hours as needed for nausea or vomiting.   pantoprazole 40 MG tablet Commonly known as: Protonix Take 1 tablet (40 mg total) by mouth daily.   polyethylene glycol 17  g packet Commonly known as: MIRALAX / GLYCOLAX Take 17 g by mouth daily as needed for mild constipation.   prochlorperazine 10 MG tablet Commonly known as: COMPAZINE Take 1 tablet (10 mg total) by mouth every 6 (six) hours as needed for nausea or vomiting.   senna-docusate 8.6-50 MG tablet Commonly known as: Senokot-S Take 2 tablets by mouth 2 (two) times daily as needed for mild constipation. What changed: when to take this   simethicone 80 MG chewable tablet Commonly known as: MYLICON Chew 1 tablet (80 mg total) by mouth 4 (four) times daily as needed for flatulence.   sodium bicarbonate/sodium chloride Soln 1 application by Mouth Rinse route as needed for dry mouth or mouth pain.   vitamin C with rose hips 1000 MG tablet Take 1,000 mg by mouth daily.        HPI:  [name] a 75year old Caucasian male, with past medical history of hypertension, BPH.  He was admitted to [facility] in November 2021 due to fatigue and constipation.  He was found to have severe hypercalcemia and CT scan showed a large mediastinal and liver mass with diffuse retroperitoneal lymphadenopathy.  He received IV fluids and pamidronate for treatment of hypercalcemia with resolution.  He also had hyperuricemia and was treated with rasburicase and then placed on allopurinol.  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  The patient was discharged home with a course of prednisone x5 days. Once biopsy resulted, it was recommended him to begin inpatient EPOCH-R for treatment of his lymphoma.    The patient was seen on the morning of admission and was clinically stable.  He was not having any issues with pain, nausea, fever, chills, bleeding, or other discomfort.  Constipation was well controlled with medication. He had mild fatigue but able to function well at home.  Denied peripheral neuropathy. He was seen on the morning of admission prior to beginning cycle #6 of EPOCH-R.  Hospital Course:  [name] was admitted on 08/20/2020 and chemotherapy was started as planned on day 1.  The patient overall tolerated his chemotherapy well this admission.  He had no significant side effects such as mucositis, nausea, vomiting.  Constipation was overall well controlled this admission.  The patient received intrathecal methotrexate on 08/21/2020 and tolerated the procedure well.    The patient was seen on the morning of 3/18/thousand 22 and was feeling well overall.  His exam remained unchanged.  CBC showed mild anemia which was overall stable.  He has mild worsening of his thrombocytopenia but does not require platelet transfusion at this time.  We will plan to continue to monitor CBC very closely as an  outpatient.  He was advised to hold his home dose of aspirin.  He had mild hypokalemia and was given a dose of K-Dur 40 mEq prior to discharge.   The patient will have a lab, Rituxan, Neulasta appointment on 08/27/2020.  He will have labs on 08/30/2020.  Labs and follow-up visit are scheduled for 09/07/2020  Signed: Mikey Bussing 08/24/2020, 7:39 AM   Addendum  I have seen the patient, examined him. I agree with the assessment and and plan and have edited the notes.   [name] tolerated this cycle well. He developed moderate thrombocytopenia with chemo this time and we will repeat CBC on Monday 3/21 when he returns to office for Tituximab and GCSF,  to see if he needs platelet transfusion.   Truitt Merle  08/24/2020

## 2020-08-24 NOTE — Progress Notes (Signed)
Continue dose of cyclophosphamide at 600 mg/m2.  Ok to treat with platelets of 66,000.  T.O. Dr Lavonda Jumbo, PharmD 08/24/20 @ 0830

## 2020-08-27 ENCOUNTER — Inpatient Hospital Stay: Payer: Medicare HMO

## 2020-08-27 ENCOUNTER — Other Ambulatory Visit: Payer: Self-pay

## 2020-08-27 VITALS — BP 95/57 | HR 84 | Temp 97.8°F | Resp 16 | Wt 212.0 lb

## 2020-08-27 DIAGNOSIS — R634 Abnormal weight loss: Secondary | ICD-10-CM | POA: Diagnosis not present

## 2020-08-27 DIAGNOSIS — Z452 Encounter for adjustment and management of vascular access device: Secondary | ICD-10-CM | POA: Diagnosis not present

## 2020-08-27 DIAGNOSIS — D61810 Antineoplastic chemotherapy induced pancytopenia: Secondary | ICD-10-CM | POA: Diagnosis not present

## 2020-08-27 DIAGNOSIS — Z9221 Personal history of antineoplastic chemotherapy: Secondary | ICD-10-CM | POA: Diagnosis not present

## 2020-08-27 DIAGNOSIS — Z95828 Presence of other vascular implants and grafts: Secondary | ICD-10-CM

## 2020-08-27 DIAGNOSIS — N179 Acute kidney failure, unspecified: Secondary | ICD-10-CM | POA: Diagnosis not present

## 2020-08-27 DIAGNOSIS — R5383 Other fatigue: Secondary | ICD-10-CM | POA: Diagnosis not present

## 2020-08-27 DIAGNOSIS — C8330 Diffuse large B-cell lymphoma, unspecified site: Secondary | ICD-10-CM

## 2020-08-27 DIAGNOSIS — C8339 Diffuse large B-cell lymphoma, extranodal and solid organ sites: Secondary | ICD-10-CM | POA: Diagnosis present

## 2020-08-27 DIAGNOSIS — D649 Anemia, unspecified: Secondary | ICD-10-CM | POA: Diagnosis not present

## 2020-08-27 DIAGNOSIS — Z298 Encounter for other specified prophylactic measures: Secondary | ICD-10-CM | POA: Diagnosis present

## 2020-08-27 LAB — CBC WITH DIFFERENTIAL (CANCER CENTER ONLY)
Abs Immature Granulocytes: 0.02 10*3/uL (ref 0.00–0.07)
Basophils Absolute: 0 10*3/uL (ref 0.0–0.1)
Basophils Relative: 0 %
Eosinophils Absolute: 0.1 10*3/uL (ref 0.0–0.5)
Eosinophils Relative: 2 %
HCT: 25.7 % — ABNORMAL LOW (ref 39.0–52.0)
Hemoglobin: 8.6 g/dL — ABNORMAL LOW (ref 13.0–17.0)
Immature Granulocytes: 1 %
Lymphocytes Relative: 2 %
Lymphs Abs: 0.1 10*3/uL — ABNORMAL LOW (ref 0.7–4.0)
MCH: 30.3 pg (ref 26.0–34.0)
MCHC: 33.5 g/dL (ref 30.0–36.0)
MCV: 90.5 fL (ref 80.0–100.0)
Monocytes Absolute: 0 10*3/uL — ABNORMAL LOW (ref 0.1–1.0)
Monocytes Relative: 0 %
Neutro Abs: 3.2 10*3/uL (ref 1.7–7.7)
Neutrophils Relative %: 95 %
Platelet Count: 54 10*3/uL — ABNORMAL LOW (ref 150–400)
RBC: 2.84 MIL/uL — ABNORMAL LOW (ref 4.22–5.81)
RDW: 16.3 % — ABNORMAL HIGH (ref 11.5–15.5)
WBC Count: 3.4 10*3/uL — ABNORMAL LOW (ref 4.0–10.5)
nRBC: 0 % (ref 0.0–0.2)

## 2020-08-27 LAB — URIC ACID: Uric Acid, Serum: 5.2 mg/dL (ref 3.7–8.6)

## 2020-08-27 LAB — CMP (CANCER CENTER ONLY)
ALT: 26 U/L (ref 0–44)
AST: 16 U/L (ref 15–41)
Albumin: 3.2 g/dL — ABNORMAL LOW (ref 3.5–5.0)
Alkaline Phosphatase: 53 U/L (ref 38–126)
Anion gap: 8 (ref 5–15)
BUN: 22 mg/dL (ref 8–23)
CO2: 25 mmol/L (ref 22–32)
Calcium: 9.2 mg/dL (ref 8.9–10.3)
Chloride: 104 mmol/L (ref 98–111)
Creatinine: 0.81 mg/dL (ref 0.61–1.24)
GFR, Estimated: >60 mL/min (ref >60)
Glucose, Bld: 169 mg/dL — ABNORMAL HIGH (ref 70–99)
Potassium: 4 mmol/L (ref 3.5–5.1)
Sodium: 137 mmol/L (ref 135–145)
Total Bilirubin: 1 mg/dL (ref 0.3–1.2)
Total Protein: 5.3 g/dL — ABNORMAL LOW (ref 6.5–8.1)

## 2020-08-27 LAB — LACTATE DEHYDROGENASE: LDH: 272 U/L — ABNORMAL HIGH (ref 98–192)

## 2020-08-27 MED ORDER — SODIUM CHLORIDE 0.9 % IV SOLN
375.0000 mg/m2 | Freq: Once | INTRAVENOUS | Status: AC
  Administered 2020-08-27: 800 mg via INTRAVENOUS
  Filled 2020-08-27: qty 50

## 2020-08-27 MED ORDER — DIPHENHYDRAMINE HCL 25 MG PO CAPS
50.0000 mg | ORAL_CAPSULE | Freq: Once | ORAL | Status: AC
  Administered 2020-08-27: 50 mg via ORAL

## 2020-08-27 MED ORDER — ACETAMINOPHEN 325 MG PO TABS
ORAL_TABLET | ORAL | Status: AC
  Filled 2020-08-27: qty 2

## 2020-08-27 MED ORDER — SODIUM CHLORIDE 0.9 % IV SOLN
INTRAVENOUS | Status: DC
  Filled 2020-08-27: qty 250

## 2020-08-27 MED ORDER — DIPHENHYDRAMINE HCL 25 MG PO CAPS
ORAL_CAPSULE | ORAL | Status: AC
  Filled 2020-08-27: qty 1

## 2020-08-27 MED ORDER — SODIUM CHLORIDE 0.9% FLUSH
10.0000 mL | INTRAVENOUS | Status: DC | PRN
  Filled 2020-08-27: qty 10

## 2020-08-27 MED ORDER — HEPARIN SOD (PORK) LOCK FLUSH 100 UNIT/ML IV SOLN
500.0000 [IU] | Freq: Once | INTRAVENOUS | Status: AC | PRN
  Administered 2020-08-27: 500 [IU]
  Filled 2020-08-27: qty 5

## 2020-08-27 MED ORDER — PEGFILGRASTIM-CBQV 6 MG/0.6ML ~~LOC~~ SOSY
6.0000 mg | PREFILLED_SYRINGE | Freq: Once | SUBCUTANEOUS | Status: AC
  Administered 2020-08-27: 6 mg via SUBCUTANEOUS

## 2020-08-27 MED ORDER — SODIUM CHLORIDE 0.9% FLUSH
10.0000 mL | INTRAVENOUS | Status: DC | PRN
  Administered 2020-08-27: 10 mL via INTRAVENOUS
  Filled 2020-08-27: qty 10

## 2020-08-27 MED ORDER — ACETAMINOPHEN 325 MG PO TABS
650.0000 mg | ORAL_TABLET | Freq: Once | ORAL | Status: AC
  Administered 2020-08-27: 650 mg via ORAL

## 2020-08-27 MED ORDER — PEGFILGRASTIM-CBQV 6 MG/0.6ML ~~LOC~~ SOSY
PREFILLED_SYRINGE | SUBCUTANEOUS | Status: AC
  Filled 2020-08-27: qty 0.6

## 2020-08-27 NOTE — Patient Instructions (Signed)
[facility] Discharge Instructions for Patients Receiving Chemotherapy  Today you received the following chemotherapy agents: Rituximab (Rituxan)  To help prevent nausea and vomiting after your treatment, we encourage you to take your nausea medication  as prescribed.    If you develop nausea and vomiting that is not controlled by your nausea medication, call the clinic.   BELOW ARE SYMPTOMS THAT SHOULD BE REPORTED IMMEDIATELY:  *FEVER GREATER THAN 100.5 F  *CHILLS WITH OR WITHOUT FEVER  NAUSEA AND VOMITING THAT IS NOT CONTROLLED WITH YOUR NAUSEA MEDICATION  *UNUSUAL SHORTNESS OF BREATH  *UNUSUAL BRUISING OR BLEEDING  TENDERNESS IN MOUTH AND THROAT WITH OR WITHOUT PRESENCE OF ULCERS  *URINARY PROBLEMS  *BOWEL PROBLEMS  UNUSUAL RASH Items with * indicate a potential emergency and should be followed up as soon as possible.  Feel free to call the clinic should you have any questions or concerns. The clinic phone number is [phone].  Please show the [facility] at check-in to the Emergency Department and triage nurse.  Pegfilgrastim injection What is this medicine? PEGFILGRASTIM (PEG fil gra stim) is a long-acting granulocyte colony-stimulating factor that stimulates the growth of neutrophils, a type of white blood cell important in the body's fight against infection. It is used to reduce the incidence of fever and infection in patients with certain types of cancer who are receiving chemotherapy that affects the bone marrow, and to increase survival after being exposed to high doses of radiation. This medicine may be used for other purposes; ask your health care provider or pharmacist if you have questions. COMMON BRAND NAME(S): Rexene Edison, Ziextenzo What should I tell my health care provider before I take this medicine? They need to know if you have any of these conditions:  kidney disease  latex allergy  ongoing  radiation therapy  sickle cell disease  skin reactions to acrylic adhesives (On-Body Injector only)  an unusual or allergic reaction to pegfilgrastim, filgrastim, other medicines, foods, dyes, or preservatives  pregnant or trying to get pregnant  breast-feeding How should I use this medicine? This medicine is for injection under the skin. If you get this medicine at home, you will be taught how to prepare and give the pre-filled syringe or how to use the On-body Injector. Refer to the patient Instructions for Use for detailed instructions. Use exactly as directed. Tell your healthcare provider immediately if you suspect that the On-body Injector may not have performed as intended or if you suspect the use of the On-body Injector resulted in a missed or partial dose. It is important that you put your used needles and syringes in a special sharps container. Do not put them in a trash can. If you do not have a sharps container, call your pharmacist or healthcare provider to get one. Talk to your pediatrician regarding the use of this medicine in children. While this drug may be prescribed for selected conditions, precautions do apply. Overdosage: If you think you have taken too much of this medicine contact a poison control center or emergency room at once. NOTE: This medicine is only for you. Do not share this medicine with others. What if I miss a dose? It is important not to miss your dose. Call your doctor or health care professional if you miss your dose. If you miss a dose due to an On-body Injector failure or leakage, a new dose should be administered as soon as possible using a single prefilled syringe for manual  use. What may interact with this medicine? Interactions have not been studied. This list may not describe all possible interactions. Give your health care provider a list of all the medicines, herbs, non-prescription drugs, or dietary supplements you use. Also tell them if you  smoke, drink alcohol, or use illegal drugs. Some items may interact with your medicine. What should I watch for while using this medicine? Your condition will be monitored carefully while you are receiving this medicine. You may need blood work done while you are taking this medicine. Talk to your health care provider about your risk of cancer. You may be more at risk for certain types of cancer if you take this medicine. If you are going to need a MRI, CT scan, or other procedure, tell your doctor that you are using this medicine (On-Body Injector only). What side effects may I notice from receiving this medicine? Side effects that you should report to your doctor or health care professional as soon as possible:  allergic reactions (skin rash, itching or hives, swelling of the face, lips, or tongue)  back pain  dizziness  fever  pain, redness, or irritation at site where injected  pinpoint red spots on the skin  red or dark-brown urine  shortness of breath or breathing problems  stomach or side pain, or pain at the shoulder  swelling  tiredness  trouble passing urine or change in the amount of urine  unusual bruising or bleeding Side effects that usually do not require medical attention (report to your doctor or health care professional if they continue or are bothersome):  bone pain  muscle pain This list may not describe all possible side effects. Call your doctor for medical advice about side effects. You may report side effects to FDA at 1-800-FDA-1088. Where should I keep my medicine? Keep out of the reach of children. If you are using this medicine at home, you will be instructed on how to store it. Throw away any unused medicine after the expiration date on the label. NOTE: This sheet is a summary. It may not cover all possible information. If you have questions about this medicine, talk to your doctor, pharmacist, or health care provider.  2021 Elsevier/Gold  Standard (2019-06-17 13:20:51)

## 2020-08-27 NOTE — Patient Instructions (Signed)
Implanted Port Insertion, Care After This sheet gives you information about how to care for yourself after your procedure. Your health care provider may also give you more specific instructions. If you have problems or questions, contact your health care provider. What can I expect after the procedure? After the procedure, it is common to have:  Discomfort at the port insertion site.  Bruising on the skin over the port. This should improve over 3-4 days. Follow these instructions at home: Port care  After your port is placed, you will get a manufacturer's information card. The card has information about your port. Keep this card with you at all times.  Take care of the port as told by your health care provider. Ask your health care provider if you or a family member can get training for taking care of the port at home. A home health care nurse may also take care of the port.  Make sure to remember what type of port you have. Incision care  Follow instructions from your health care provider about how to take care of your port insertion site. Make sure you: ? Wash your hands with soap and water before and after you change your bandage (dressing). If soap and water are not available, use hand sanitizer. ? Change your dressing as told by your health care provider. ? Leave stitches (sutures), skin glue, or adhesive strips in place. These skin closures may need to stay in place for 2 weeks or longer. If adhesive strip edges start to loosen and curl up, you may trim the loose edges. Do not remove adhesive strips completely unless your health care provider tells you to do that.  Check your port insertion site every day for signs of infection. Check for: ? Redness, swelling, or pain. ? Fluid or blood. ? Warmth. ? Pus or a bad smell.      Activity  Return to your normal activities as told by your health care provider. Ask your health care provider what activities are safe for you.  Do not  lift anything that is heavier than 10 lb (4.5 kg), or the limit that you are told, until your health care provider says that it is safe. General instructions  Take over-the-counter and prescription medicines only as told by your health care provider.  Do not take baths, swim, or use a hot tub until your health care provider approves. Ask your health care provider if you may take showers. You may only be allowed to take sponge baths.  Do not drive for 24 hours if you were given a sedative during your procedure.  Wear a medical alert bracelet in case of an emergency. This will tell any health care providers that you have a port.  Keep all follow-up visits as told by your health care provider. This is important. Contact a health care provider if:  You cannot flush your port with saline as directed, or you cannot draw blood from the port.  You have a fever or chills.  You have redness, swelling, or pain around your port insertion site.  You have fluid or blood coming from your port insertion site.  Your port insertion site feels warm to the touch.  You have pus or a bad smell coming from the port insertion site. Get help right away if:  You have chest pain or shortness of breath.  You have bleeding from your port that you cannot control. Summary  Take care of the port as told by your   health care provider. Keep the manufacturer's information card with you at all times.  Change your dressing as told by your health care provider.  Contact a health care provider if you have a fever or chills or if you have redness, swelling, or pain around your port insertion site.  Keep all follow-up visits as told by your health care provider. This information is not intended to replace advice given to you by your health care provider. Make sure you discuss any questions you have with your health care provider. Document Revised: 12/22/2017 Document Reviewed: 12/22/2017 [employer]vier Patient Education   2021 [employer]  

## 2020-08-30 ENCOUNTER — Other Ambulatory Visit: Payer: Self-pay

## 2020-08-30 ENCOUNTER — Inpatient Hospital Stay: Payer: Medicare HMO

## 2020-08-30 VITALS — BP 113/53 | HR 95 | Temp 98.0°F | Resp 17

## 2020-08-30 DIAGNOSIS — C8339 Diffuse large B-cell lymphoma, extranodal and solid organ sites: Secondary | ICD-10-CM

## 2020-08-30 DIAGNOSIS — D649 Anemia, unspecified: Secondary | ICD-10-CM

## 2020-08-30 DIAGNOSIS — D696 Thrombocytopenia, unspecified: Secondary | ICD-10-CM

## 2020-08-30 DIAGNOSIS — C8330 Diffuse large B-cell lymphoma, unspecified site: Secondary | ICD-10-CM

## 2020-08-30 DIAGNOSIS — Z95828 Presence of other vascular implants and grafts: Secondary | ICD-10-CM

## 2020-08-30 LAB — CBC WITH DIFFERENTIAL (CANCER CENTER ONLY)
Abs Immature Granulocytes: 0 10*3/uL (ref 0.00–0.07)
Band Neutrophils: 4 %
Basophils Absolute: 0 10*3/uL (ref 0.0–0.1)
Basophils Relative: 0 %
Eosinophils Absolute: 0 10*3/uL (ref 0.0–0.5)
Eosinophils Relative: 2 %
HCT: 23.2 % — ABNORMAL LOW (ref 39.0–52.0)
Hemoglobin: 7.9 g/dL — ABNORMAL LOW (ref 13.0–17.0)
Lymphocytes Relative: 6 %
Lymphs Abs: 0.1 10*3/uL — ABNORMAL LOW (ref 0.7–4.0)
MCH: 30.6 pg (ref 26.0–34.0)
MCHC: 34.1 g/dL (ref 30.0–36.0)
MCV: 89.9 fL (ref 80.0–100.0)
Monocytes Absolute: 0.1 10*3/uL (ref 0.1–1.0)
Monocytes Relative: 4 %
Neutro Abs: 1.9 10*3/uL (ref 1.7–7.7)
Neutrophils Relative %: 84 %
Platelet Count: 28 10*3/uL — ABNORMAL LOW (ref 150–400)
RBC: 2.58 MIL/uL — ABNORMAL LOW (ref 4.22–5.81)
RDW: 16.3 % — ABNORMAL HIGH (ref 11.5–15.5)
WBC Count: 2.2 10*3/uL — ABNORMAL LOW (ref 4.0–10.5)
nRBC: 0 % (ref 0.0–0.2)

## 2020-08-30 LAB — CMP (CANCER CENTER ONLY)
ALT: 20 U/L (ref 0–44)
AST: 14 U/L — ABNORMAL LOW (ref 15–41)
Albumin: 3.3 g/dL — ABNORMAL LOW (ref 3.5–5.0)
Alkaline Phosphatase: 66 U/L (ref 38–126)
Anion gap: 10 (ref 5–15)
BUN: 21 mg/dL (ref 8–23)
CO2: 27 mmol/L (ref 22–32)
Calcium: 8.8 mg/dL — ABNORMAL LOW (ref 8.9–10.3)
Chloride: 103 mmol/L (ref 98–111)
Creatinine: 0.8 mg/dL (ref 0.61–1.24)
GFR, Estimated: >60 mL/min (ref >60)
Glucose, Bld: 134 mg/dL — ABNORMAL HIGH (ref 70–99)
Potassium: 3.7 mmol/L (ref 3.5–5.1)
Sodium: 140 mmol/L (ref 135–145)
Total Bilirubin: 0.5 mg/dL (ref 0.3–1.2)
Total Protein: 5.6 g/dL — ABNORMAL LOW (ref 6.5–8.1)

## 2020-08-30 MED ORDER — HEPARIN SOD (PORK) LOCK FLUSH 100 UNIT/ML IV SOLN
500.0000 [IU] | Freq: Once | INTRAVENOUS | Status: AC
  Administered 2020-08-30: 500 [IU] via INTRAVENOUS
  Filled 2020-08-30: qty 5

## 2020-08-30 MED ORDER — SODIUM CHLORIDE 0.9% FLUSH
10.0000 mL | INTRAVENOUS | Status: DC | PRN
  Administered 2020-08-30: 10 mL via INTRAVENOUS
  Filled 2020-08-30: qty 10

## 2020-08-31 ENCOUNTER — Inpatient Hospital Stay: Payer: Medicare HMO

## 2020-08-31 DIAGNOSIS — C8339 Diffuse large B-cell lymphoma, extranodal and solid organ sites: Secondary | ICD-10-CM | POA: Diagnosis not present

## 2020-08-31 DIAGNOSIS — D696 Thrombocytopenia, unspecified: Secondary | ICD-10-CM

## 2020-08-31 DIAGNOSIS — Z95828 Presence of other vascular implants and grafts: Secondary | ICD-10-CM

## 2020-08-31 DIAGNOSIS — D649 Anemia, unspecified: Secondary | ICD-10-CM

## 2020-08-31 DIAGNOSIS — C8330 Diffuse large B-cell lymphoma, unspecified site: Secondary | ICD-10-CM

## 2020-08-31 LAB — SAMPLE TO BLOOD BANK

## 2020-08-31 LAB — PREPARE RBC (CROSSMATCH)

## 2020-08-31 MED ORDER — HEPARIN SOD (PORK) LOCK FLUSH 100 UNIT/ML IV SOLN
500.0000 [IU] | Freq: Every day | INTRAVENOUS | Status: AC | PRN
  Administered 2020-08-31: 500 [IU]
  Filled 2020-08-31: qty 5

## 2020-08-31 MED ORDER — HEPARIN SOD (PORK) LOCK FLUSH 100 UNIT/ML IV SOLN
500.0000 [IU] | Freq: Once | INTRAVENOUS | Status: DC
  Filled 2020-08-31: qty 5

## 2020-08-31 MED ORDER — ACETAMINOPHEN 325 MG PO TABS
ORAL_TABLET | ORAL | Status: AC
  Filled 2020-08-31: qty 2

## 2020-08-31 MED ORDER — DIPHENHYDRAMINE HCL 25 MG PO CAPS
25.0000 mg | ORAL_CAPSULE | Freq: Once | ORAL | Status: AC
  Administered 2020-08-31: 25 mg via ORAL

## 2020-08-31 MED ORDER — SODIUM CHLORIDE 0.9% FLUSH
10.0000 mL | INTRAVENOUS | Status: DC | PRN
  Administered 2020-08-31: 10 mL via INTRAVENOUS
  Filled 2020-08-31: qty 10

## 2020-08-31 MED ORDER — SODIUM CHLORIDE 0.9% FLUSH
10.0000 mL | INTRAVENOUS | Status: AC | PRN
  Administered 2020-08-31: 10 mL
  Filled 2020-08-31: qty 10

## 2020-08-31 MED ORDER — DIPHENHYDRAMINE HCL 25 MG PO CAPS
ORAL_CAPSULE | ORAL | Status: AC
  Filled 2020-08-31: qty 1

## 2020-08-31 MED ORDER — ACETAMINOPHEN 325 MG PO TABS
650.0000 mg | ORAL_TABLET | Freq: Once | ORAL | Status: AC
  Administered 2020-08-31: 650 mg via ORAL

## 2020-08-31 MED ORDER — SODIUM CHLORIDE 0.9% IV SOLUTION
250.0000 mL | Freq: Once | INTRAVENOUS | Status: AC
  Administered 2020-08-31: 250 mL via INTRAVENOUS
  Filled 2020-08-31: qty 250

## 2020-08-31 NOTE — Patient Instructions (Signed)
Implanted Port Insertion, Care After This sheet gives you information about how to care for yourself after your procedure. Your health care provider may also give you more specific instructions. If you have problems or questions, contact your health care provider. What can I expect after the procedure? After the procedure, it is common to have:  Discomfort at the port insertion site.  Bruising on the skin over the port. This should improve over 3-4 days. Follow these instructions at home: Port care  After your port is placed, you will get a manufacturer's information card. The card has information about your port. Keep this card with you at all times.  Take care of the port as told by your health care provider. Ask your health care provider if you or a family member can get training for taking care of the port at home. A home health care nurse may also take care of the port.  Make sure to remember what type of port you have. Incision care  Follow instructions from your health care provider about how to take care of your port insertion site. Make sure you: ? Wash your hands with soap and water before and after you change your bandage (dressing). If soap and water are not available, use hand sanitizer. ? Change your dressing as told by your health care provider. ? Leave stitches (sutures), skin glue, or adhesive strips in place. These skin closures may need to stay in place for 2 weeks or longer. If adhesive strip edges start to loosen and curl up, you may trim the loose edges. Do not remove adhesive strips completely unless your health care provider tells you to do that.  Check your port insertion site every day for signs of infection. Check for: ? Redness, swelling, or pain. ? Fluid or blood. ? Warmth. ? Pus or a bad smell.      Activity  Return to your normal activities as told by your health care provider. Ask your health care provider what activities are safe for you.  Do not  lift anything that is heavier than 10 lb (4.5 kg), or the limit that you are told, until your health care provider says that it is safe. General instructions  Take over-the-counter and prescription medicines only as told by your health care provider.  Do not take baths, swim, or use a hot tub until your health care provider approves. Ask your health care provider if you may take showers. You may only be allowed to take sponge baths.  Do not drive for 24 hours if you were given a sedative during your procedure.  Wear a medical alert bracelet in case of an emergency. This will tell any health care providers that you have a port.  Keep all follow-up visits as told by your health care provider. This is important. Contact a health care provider if:  You cannot flush your port with saline as directed, or you cannot draw blood from the port.  You have a fever or chills.  You have redness, swelling, or pain around your port insertion site.  You have fluid or blood coming from your port insertion site.  Your port insertion site feels warm to the touch.  You have pus or a bad smell coming from the port insertion site. Get help right away if:  You have chest pain or shortness of breath.  You have bleeding from your port that you cannot control. Summary  Take care of the port as told by your   health care provider. Keep the manufacturer's information card with you at all times.  Change your dressing as told by your health care provider.  Contact a health care provider if you have a fever or chills or if you have redness, swelling, or pain around your port insertion site.  Keep all follow-up visits as told by your health care provider. This information is not intended to replace advice given to you by your health care provider. Make sure you discuss any questions you have with your health care provider. Document Revised: 12/22/2017 Document Reviewed: 12/22/2017 [employer]vier Patient Education   2021 [employer]  

## 2020-08-31 NOTE — Patient Instructions (Signed)
Platelet Transfusion A platelet transfusion is a procedure in which you receive donated platelets through an IV. Platelets are tiny pieces of blood cells. When you get an injury, platelets clump together in the area to form a blood clot. This helps stop bleeding and is the beginning of the healing process. If you have too few platelets, your blood may have trouble clotting. This may cause you to bleed and bruise very easily. You may need a platelet transfusion if you have a condition that causes a low number of platelets (thrombocytopenia). A platelet transfusion may be used to stop or prevent excessive bleeding. Tell a health care provider about:  Any reactions you have had during previous transfusions.  Any allergies you have.  All medicines you are taking, including vitamins, herbs, eye drops, creams, and over-the-counter medicines.  Any blood disorders you have.  Any surgeries you have had.  Any medical conditions you have.  Whether you are pregnant or may be pregnant. What are the risks? Generally, this is a safe procedure. However, problems may occur, including:  Fever.  Infection.  Allergic reaction to the donor platelets.  Your body's disease-fighting system (immune system) attacking the donor platelets (hemolytic reaction). This is rare.  A rare reaction that causes lung damage (transfusion-related acute lung injury). What happens before the procedure? Medicines  Ask your health care provider about: ? Changing or stopping your regular medicines. This is especially important if you are taking diabetes medicines or blood thinners. ? Taking medicines such as aspirin and ibuprofen. These medicines can thin your blood. Do not take these medicines unless your health care provider tells you to take them. ? Taking over-the-counter medicines, vitamins, herbs, and supplements. General instructions  You will have a blood test to determine your blood type. Your blood type  determines what kind of platelets you will be given.  Follow instructions from your health care provider about eating or drinking restrictions.  If you have had an allergic reaction to a transfusion in the past, you may be given medicine to help prevent a reaction.  Your temperature, blood pressure, pulse, and breathing will be monitored. What happens during the procedure?  An IV will be inserted into one of your veins.  For your safety, two health care providers will verify your identity along with the donor platelets about to be infused.  A bag of donor platelets will be connected to your IV. The platelets will flow into your bloodstream. This usually takes 30-60 minutes.  Your temperature, blood pressure, pulse, and breathing will be monitored during the transfusion. This helps detect early signs of any reaction.  You will also be monitored for other symptoms that may indicate a reaction, including chills, hives, or itching.  If you have signs of a reaction at any time, your transfusion will be stopped, and you may be given medicine to help manage the reaction.  When your transfusion is complete, your IV will be removed.  Pressure may be applied to the IV site for a few minutes to stop any bleeding.  The IV site will be covered with a bandage (dressing). The procedure may vary among health care providers and hospitals.   What happens after the procedure?  Your blood pressure, temperature, pulse, and breathing will be monitored until you leave the hospital or clinic.  You may have some bruising and soreness at your IV site. Follow these instructions at home: Medicines  Take over-the-counter and prescription medicines only as told by your health care   provider.  Talk with your health care provider before you take any medicines that contain aspirin or NSAIDs. These medicines increase your risk for dangerous bleeding. General instructions  Change or remove your dressing as told  by your health care provider.  Return to your normal activities as told by your health care provider. Ask your health care provider what activities are safe for you.  Do not take baths, swim, or use a hot tub until your health care provider approves. Ask your health care provider if you may take showers.  Check your IV site every day for signs of infection. Check for: ? Redness, swelling, or pain. ? Fluid or blood. If fluid or blood drains from your IV site, use your hands to press down firmly on a bandage covering the area for a minute or two. Doing this should stop the bleeding. ? Warmth. ? Pus or a bad smell.  Keep all follow-up visits as told by your health care provider. This is important. Contact a health care provider if you have:  A headache that does not go away with medicine.  Hives, rash, or itchy skin.  Nausea or vomiting.  Unusual tiredness or weakness.  Signs of infection at your IV site. Get help right away if:  You have a fever or chills.  You urinate less often than usual.  Your urine is darker colored than normal.  You have any of the following: ? Trouble breathing. ? Pain in your back, abdomen, or chest. ? Cool, clammy skin. ? A fast heartbeat. Summary  Platelets are tiny pieces of blood cells that clump together to form a blood clot when you have an injury. If you have too few platelets, your blood may have trouble clotting.  A platelet transfusion is a procedure in which you receive donated platelets through an IV.  A platelet transfusion may be used to stop or prevent excessive bleeding.  After the procedure, check your IV site every day for signs of infection, including redness, swelling, pain, or warmth. This information is not intended to replace advice given to you by your health care provider. Make sure you discuss any questions you have with your health care provider. Document Revised: 07/01/2017 Document Reviewed: 07/01/2017 Elsevier  Patient Education  2021 [place].  Blood Transfusion, Adult, Care After This sheet gives you information about how to care for yourself after your procedure. Your doctor may also give you more specific instructions. If you have problems or questions, contact your doctor. What can I expect after the procedure? After the procedure, it is common to have:  Bruising and soreness at the IV site.  A fever or chills on the day of the procedure. This may be your body's response to the new blood cells received.  A headache. Follow these instructions at home: Insertion site care  Follow instructions from your doctor about how to take care of your insertion site. This is where an IV tube was put into your vein. Make sure you: ? Wash your hands with soap and water before and after you change your bandage (dressing). If you cannot use soap and water, use hand sanitizer. ? Change your bandage as told by your doctor.  Check your insertion site every day for signs of infection. Check for: ? Redness, swelling, or pain. ? Bleeding from the site. ? Warmth. ? Pus or a bad smell.      General instructions  Take over-the-counter and prescription medicines only as told by your doctor.  Rest as told by your doctor.  Go back to your normal activities as told by your doctor.  Keep all follow-up visits as told by your doctor. This is important. Contact a doctor if:  You have itching or red, swollen areas of skin (hives).  You feel worried or nervous (anxious).  You feel weak after doing your normal activities.  You have redness, swelling, warmth, or pain around the insertion site.  You have blood coming from the insertion site, and the blood does not stop with pressure.  You have pus or a bad smell coming from the insertion site. Get help right away if:  You have signs of a serious reaction. This may be coming from an allergy or the body's defense system (immune system). Signs  include: ? Trouble breathing or shortness of breath. ? Swelling of the face or feeling warm (flushed). ? Fever or chills. ? Head, chest, or back pain. ? Dark pee (urine) or blood in the pee. ? Widespread rash. ? Fast heartbeat. ? Feeling dizzy or light-headed. You may receive your blood transfusion in an outpatient setting. If so, you will be told whom to contact to report any reactions. These symptoms may be an emergency. Do not wait to see if the symptoms will go away. Get medical help right away. Call your local emergency services (911 in the [place] Do not drive yourself to the hospital. Summary  Bruising and soreness at the IV site are common.  Check your insertion site every day for signs of infection.  Rest as told by your doctor. Go back to your normal activities as told by your doctor.  Get help right away if you have signs of a serious reaction. This information is not intended to replace advice given to you by your health care provider. Make sure you discuss any questions you have with your health care provider. Document Revised: 11/18/2018 Document Reviewed: 11/18/2018 Elsevier Patient Education  [place].

## 2020-09-01 LAB — PREPARE PLATELET PHERESIS: Unit division: 0

## 2020-09-01 LAB — BPAM RBC
Blood Product Expiration Date: 202204192359
ISSUE DATE / TIME: 202203250952
Unit Type and Rh: 6200

## 2020-09-01 LAB — BPAM PLATELET PHERESIS
Blood Product Expiration Date: 202203272359
ISSUE DATE / TIME: 202203250844
Unit Type and Rh: 6200

## 2020-09-01 LAB — TYPE AND SCREEN
ABO/RH(D): A POS
Antibody Screen: NEGATIVE
Unit division: 0

## 2020-09-03 ENCOUNTER — Inpatient Hospital Stay: Payer: Medicare HMO

## 2020-09-03 ENCOUNTER — Other Ambulatory Visit: Payer: Self-pay

## 2020-09-03 DIAGNOSIS — C8339 Diffuse large B-cell lymphoma, extranodal and solid organ sites: Secondary | ICD-10-CM

## 2020-09-03 DIAGNOSIS — Z95828 Presence of other vascular implants and grafts: Secondary | ICD-10-CM

## 2020-09-03 LAB — CBC WITH DIFFERENTIAL (CANCER CENTER ONLY)
Abs Immature Granulocytes: 0.5 10*3/uL — ABNORMAL HIGH (ref 0.00–0.07)
Band Neutrophils: 2 %
Basophils Absolute: 0 10*3/uL (ref 0.0–0.1)
Basophils Relative: 0 %
Eosinophils Absolute: 0.1 10*3/uL (ref 0.0–0.5)
Eosinophils Relative: 1 %
HCT: 28.5 % — ABNORMAL LOW (ref 39.0–52.0)
Hemoglobin: 9.4 g/dL — ABNORMAL LOW (ref 13.0–17.0)
Lymphocytes Relative: 5 %
Lymphs Abs: 0.3 10*3/uL — ABNORMAL LOW (ref 0.7–4.0)
MCH: 30.5 pg (ref 26.0–34.0)
MCHC: 33 g/dL (ref 30.0–36.0)
MCV: 92.5 fL (ref 80.0–100.0)
Metamyelocytes Relative: 8 %
Monocytes Absolute: 0.5 10*3/uL (ref 0.1–1.0)
Monocytes Relative: 10 %
Myelocytes: 1 %
Neutro Abs: 3.8 10*3/uL (ref 1.7–7.7)
Neutrophils Relative %: 73 %
Platelet Count: 69 10*3/uL — ABNORMAL LOW (ref 150–400)
RBC: 3.08 MIL/uL — ABNORMAL LOW (ref 4.22–5.81)
RDW: 16.5 % — ABNORMAL HIGH (ref 11.5–15.5)
WBC Count: 5 10*3/uL (ref 4.0–10.5)
nRBC: 0.6 % — ABNORMAL HIGH (ref 0.0–0.2)
nRBC: 1 /100 WBC — ABNORMAL HIGH

## 2020-09-03 LAB — CMP (CANCER CENTER ONLY)
ALT: 26 U/L (ref 0–44)
AST: 21 U/L (ref 15–41)
Albumin: 3.4 g/dL — ABNORMAL LOW (ref 3.5–5.0)
Alkaline Phosphatase: 77 U/L (ref 38–126)
Anion gap: 11 (ref 5–15)
BUN: 11 mg/dL (ref 8–23)
CO2: 24 mmol/L (ref 22–32)
Calcium: 8.6 mg/dL — ABNORMAL LOW (ref 8.9–10.3)
Chloride: 105 mmol/L (ref 98–111)
Creatinine: 0.8 mg/dL (ref 0.61–1.24)
GFR, Estimated: >60 mL/min (ref >60)
Glucose, Bld: 144 mg/dL — ABNORMAL HIGH (ref 70–99)
Potassium: 3.6 mmol/L (ref 3.5–5.1)
Sodium: 140 mmol/L (ref 135–145)
Total Bilirubin: 0.4 mg/dL (ref 0.3–1.2)
Total Protein: 5.7 g/dL — ABNORMAL LOW (ref 6.5–8.1)

## 2020-09-03 MED ORDER — SODIUM CHLORIDE 0.9% FLUSH
10.0000 mL | INTRAVENOUS | Status: DC | PRN
  Administered 2020-09-03: 10 mL via INTRAVENOUS
  Filled 2020-09-03: qty 10

## 2020-09-03 MED ORDER — HEPARIN SOD (PORK) LOCK FLUSH 100 UNIT/ML IV SOLN
500.0000 [IU] | Freq: Once | INTRAVENOUS | Status: AC
  Administered 2020-09-03: 500 [IU] via INTRAVENOUS
  Filled 2020-09-03: qty 5

## 2020-09-03 NOTE — Patient Instructions (Signed)
Implanted Port Insertion, Care After This sheet gives you information about how to care for yourself after your procedure. Your health care provider may also give you more specific instructions. If you have problems or questions, contact your health care provider. What can I expect after the procedure? After the procedure, it is common to have:  Discomfort at the port insertion site.  Bruising on the skin over the port. This should improve over 3-4 days. Follow these instructions at home: Port care  After your port is placed, you will get a manufacturer's information card. The card has information about your port. Keep this card with you at all times.  Take care of the port as told by your health care provider. Ask your health care provider if you or a family member can get training for taking care of the port at home. A home health care nurse may also take care of the port.  Make sure to remember what type of port you have. Incision care  Follow instructions from your health care provider about how to take care of your port insertion site. Make sure you: ? Wash your hands with soap and water before and after you change your bandage (dressing). If soap and water are not available, use hand sanitizer. ? Change your dressing as told by your health care provider. ? Leave stitches (sutures), skin glue, or adhesive strips in place. These skin closures may need to stay in place for 2 weeks or longer. If adhesive strip edges start to loosen and curl up, you may trim the loose edges. Do not remove adhesive strips completely unless your health care provider tells you to do that.  Check your port insertion site every day for signs of infection. Check for: ? Redness, swelling, or pain. ? Fluid or blood. ? Warmth. ? Pus or a bad smell.      Activity  Return to your normal activities as told by your health care provider. Ask your health care provider what activities are safe for you.  Do not  lift anything that is heavier than 10 lb (4.5 kg), or the limit that you are told, until your health care provider says that it is safe. General instructions  Take over-the-counter and prescription medicines only as told by your health care provider.  Do not take baths, swim, or use a hot tub until your health care provider approves. Ask your health care provider if you may take showers. You may only be allowed to take sponge baths.  Do not drive for 24 hours if you were given a sedative during your procedure.  Wear a medical alert bracelet in case of an emergency. This will tell any health care providers that you have a port.  Keep all follow-up visits as told by your health care provider. This is important. Contact a health care provider if:  You cannot flush your port with saline as directed, or you cannot draw blood from the port.  You have a fever or chills.  You have redness, swelling, or pain around your port insertion site.  You have fluid or blood coming from your port insertion site.  Your port insertion site feels warm to the touch.  You have pus or a bad smell coming from the port insertion site. Get help right away if:  You have chest pain or shortness of breath.  You have bleeding from your port that you cannot control. Summary  Take care of the port as told by your   health care provider. Keep the manufacturer's information card with you at all times.  Change your dressing as told by your health care provider.  Contact a health care provider if you have a fever or chills or if you have redness, swelling, or pain around your port insertion site.  Keep all follow-up visits as told by your health care provider. This information is not intended to replace advice given to you by your health care provider. Make sure you discuss any questions you have with your health care provider. Document Revised: 12/22/2017 Document Reviewed: 12/22/2017 [employer]vier Patient Education   2021 [employer]  

## 2020-09-05 NOTE — Progress Notes (Signed)
[name]   [phone] [phone]   Clinic Follow up Note   Patient Care Team: Christain Sacramento, MD as PCP - General (Family Medicine)  Date of Service:  09/07/2020  CHIEF COMPLAINT: F/u ofDiffuse large B cell lymphoma  SUMMARY OF ONCOLOGIC HISTORY: Oncology History Overview Note  Cancer Staging Diffuse large B cell lymphoma ([place]) Staging form: Hodgkin and Non-Hodgkin Lymphoma, AJCC 8th Edition - Clinical stage from 04/23/2020: Stage IV (Diffuse large B-cell lymphoma) - Signed by Truitt Merle, MD on 04/29/2020    Diffuse large B cell lymphoma ([place])  04/20/2020 Imaging   CT CAP  IMPRESSION: 1. Large anterior mediastinal/prevascular space mass/adenopathy. The mass extends superiorly and abuts the upper trachea. 2. Large hypodense lesion in the right lobe of the liver as well as ill-defined splenic hypodense lesions. Further characterization with MRI without and with contrast recommended. The liver lesion is amenable to percutaneous tissue sampling. 3. Extensive mesenteric and retroperitoneal adenopathy. 4. Sigmoid diverticulosis. No bowel obstruction. Normal appendix. 5. Aortic Atherosclerosis (ICD10-I70.0).   04/23/2020 Cancer Staging   Staging form: Hodgkin and Non-Hodgkin Lymphoma, AJCC 8th Edition - Clinical stage from 04/23/2020: Stage IV (Diffuse large B-cell lymphoma) - Signed by Truitt Merle, MD on 04/29/2020   04/23/2020 Initial Biopsy   FINAL MICROSCOPIC DIAGNOSIS:   A. LIVER, RIGHT MASS, NEEDLE CORE BIOPSY:  - Large B-cell lymphoma  - See comment     COMMENT:   The sections show needle core biopsy fragments displaying effacement of  the architecture by a dense infiltrate of primarily large atypical  lymphoid appearing cells displaying vesicular chromatin and small  nucleoli.  This is associated with apoptosis and brisk mitosis.  The  appearance is diffuse with lack of atypical follicles.  A large battery  of  immunohistochemical stains was performed and shows that the atypical  lymphoid cells are positive for LCA, CD20, PAX 5, BCL-2, BCL6, CD5,  MUM-1 (partial).  The atypical lymphoid cells are negative for CD10,  CD30, CD34, CD138, cyclin D1, TdT, and EBV in situ hybridization,  synaptophysin, cytokeratin AE1/AE3, cytokeratin 20, cytokeratin 7,  cytokeratin 5/6, TTF-1, CD56, CDX2, and p40.  There is an admixed  relatively minor population of T-cells as primarily seen with CD3.  The  overall features are consistent with large B-cell lymphoma, ABC subtype.  The lymphomatous process displays expression of CD5.  This phenotype is  seen in 5-10% of mostly de novo large B-cell lymphoma cases or rarely as  a transformation of a low-grade B-cell lymphoma such as small  lymphocytic lymphoma.  Clinical correlation is recommended.    04/25/2020 Imaging   CT AP  IMPRESSION: 1. Development of high-density ascites in the abdomen and pelvis. Findings are most compatible with a small to moderate amount of hemoperitoneum and related to the recent liver biopsy. 2. Extensive lymphadenopathy throughout the abdomen and pelvis with a large mass or nodal mass near the pancreatic head. There are additional lesions involving the liver and spleen. Findings are suggestive for metastatic disease. 3. Development of small bilateral pleural effusions.   These results were called by telephone at the time of interpretation on 04/25/2020 at 1:43 pm to provider ERIC [place] who verbally acknowledged these results.   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 Marked reduction  in volume of anterior mediastinal mass now with low metabolic activity ( [place] 2). 2. Reduction in volume of retroperitoneal nodal mass  adjacent the pancreas with low metabolic activity ( [place] 3). 3. No increased metabolic activity associated with the spleen 4. Interval contraction of hepatic hematoma post biopsy. No hypermetabolic elements within the hepatic lesion. 5. Grade 2 anterolisthesis L4 on L5.        CURRENT THERAPY:  Inpatient R-EPOCHq3weeksfor 6 cycles starting 05/01/20 with intrathecal prophylactic treatmentstarting with C2  INTERVAL HISTORY:  John [name] is here for a follow up. He presents to the clinic with his wife. He notes he was fatigued after his last cycle treatment. He has recovered after blood transfusion. He has mouth sore with last cycle which is improving with magic mouthwash. I reviewed his medication list with him. He notes minimal tingling in his fingertips with no change in function.     REVIEW OF SYSTEMS:   Constitutional: Denies fevers, chills or abnormal weight loss Eyes: Denies blurriness of vision Ears, nose, mouth, throat, and face: Denies mucositis or sore throat (+) Mouth sore.  Respiratory: Denies cough, dyspnea or wheezes Cardiovascular: Denies palpitation, chest discomfort or lower extremity swelling Gastrointestinal:  Denies nausea, heartburn or change in bowel habits Skin: Denies abnormal skin rashes Lymphatics: Denies new lymphadenopathy or easy bruising Neurological: (+) Minimal tingling in fingertips  Behavioral/Psych: Mood is stable, no new changes  All other systems were reviewed with the patient and are negative.  MEDICAL HISTORY:  Past Medical History:  Diagnosis Date  . Cancer ([place])   . Goals of care, counseling/discussion 05/06/2020  . Hypertension   . Knee pain, right     SURGICAL HISTORY: Past Surgical History:  Procedure Laterality Date  . FOOT SURGERY    . IR IMAGING GUIDED PORT INSERTION  05/28/2020  . TONSILLECTOMY      I have reviewed the social history and family history with the patient and they are unchanged from previous  note.  ALLERGIES:  has No Known Allergies.  MEDICATIONS:  Current Outpatient Medications  Medication Sig Dispense Refill  . allopurinol (ZYLOPRIM) 100 MG tablet TAKE 1 TABLET(100 MG) BY MOUTH DAILY (Patient taking differently: Take 100 mg by mouth daily.) 90 tablet 1  . Ascorbic Acid (VITAMIN C WITH ROSE HIPS) 1000 MG tablet Take 1,000 mg by mouth daily.    Marland Kitchen doxazosin (CARDURA) 4 MG tablet Take 4 mg by mouth daily.    Marland Kitchen lidocaine-prilocaine (EMLA) cream Apply 1 application topically as needed. (Patient taking differently: Apply 1 application topically daily as needed (pain).) 30 g 0  . magic mouthwash w/lidocaine SOLN Take 15 mLs by mouth 4 (four) times daily as needed for mouth pain. 400 mL 0  . melatonin 3 MG TABS tablet Take 2 tablets (6 mg total) by mouth at bedtime as needed (sleep). (Patient taking differently: Take 6 mg by mouth at bedtime.) 30 tablet 0  . metoprolol tartrate (LOPRESSOR) 25 MG tablet Take 0.5 tablets (12.5 mg total) by mouth 2 (two) times daily. (Patient taking differently: Take 12.5 mg by mouth in the morning.) 30 tablet 0  . Multiple Vitamins-Minerals (MULTIVITAMIN PO) Take 1 tablet by mouth daily.    . Nutritional Supplements (BOOST MAX PROTEIN PO) Take 237 mLs by mouth daily. Chocolate    . Omega-3 Fatty Acids (OMEGA 3 PO) Take 1 tablet by mouth daily.    . ondansetron (ZOFRAN) 8 MG tablet Take 1 tablet (8 mg total) by mouth every 8 (  eight) hours as needed for nausea or vomiting. 20 tablet 0  . pantoprazole (PROTONIX) 40 MG tablet Take 1 tablet (40 mg total) by mouth daily. 30 tablet 3  . polyethylene glycol (MIRALAX / GLYCOLAX) 17 g packet Take 17 g by mouth daily as needed for mild constipation.    . prochlorperazine (COMPAZINE) 10 MG tablet Take 1 tablet (10 mg total) by mouth every 6 (six) hours as needed for nausea or vomiting. 30 tablet 0  . senna-docusate (SENOKOT-S) 8.6-50 MG tablet Take 2 tablets by mouth 2 (two) times daily as needed for mild constipation.  (Patient taking differently: Take 2 tablets by mouth 2 (two) times daily.)    . simethicone (MYLICON) 80 MG chewable tablet Chew 1 tablet (80 mg total) by mouth 4 (four) times daily as needed for flatulence. (Patient not taking: No sig reported) 30 tablet 0  . Sodium Chloride-Sodium Bicarb (SODIUM BICARBONATE/SODIUM CHLORIDE) SOLN 1 application by Mouth Rinse route as needed for dry mouth or mouth pain.     No current facility-administered medications for this visit.    PHYSICAL EXAMINATION: ECOG PERFORMANCE STATUS: 1 - Symptomatic but completely ambulatory  Vitals:   09/07/20 1053  BP: 121/79  Pulse: 87  Resp: 19  Temp: 97.7 F (36.5 C)  SpO2: 99%   Filed Weights   09/07/20 1053  Weight: 212 lb 14.4 oz (96.6 kg)    GENERAL:alert, no distress and comfortable SKIN: skin color, texture, turgor are normal, no rashes or significant lesions EYES: normal, conjunctiva are pink and non-injected, sclera clear OROPHARYNX:no exudate, no erythema and lips, buccal mucosa, and tongue normal  NECK: supple, thyroid normal size, non-tender, without nodularity LYMPH:  no palpable lymphadenopathy in the cervical, axillary LUNGS: clear to auscultation and percussion with normal breathing effort HEART: regular rate & rhythm and no murmurs and no lower extremity edema ABDOMEN:abdomen soft, non-tender and normal bowel sounds Musculoskeletal:no cyanosis of digits and no clubbing  PSYCH: alert & oriented x 3 with fluent speech NEURO: no focal motor/sensory deficits   LABORATORY DATA:  I have reviewed the data as listed CBC Latest Ref Rng & Units 09/07/2020 09/03/2020 08/30/2020  WBC 4.0 - 10.5 K/uL 5.7 5.0 2.2(L)  Hemoglobin 13.0 - 17.0 g/dL 9.6(L) 9.4(L) 7.9(L)  Hematocrit 39.0 - 52.0 % 29.2(L) 28.5(L) 23.2(L)  Platelets 150 - 400 K/uL 91(L) 69(L) 28(L)     CMP Latest Ref Rng & Units 09/07/2020 09/03/2020 08/30/2020  Glucose 70 - 99 mg/dL 140(H) 144(H) 134(H)  BUN 8 - 23 mg/dL 16 11 21   Creatinine  0.61 - 1.24 mg/dL 0.79 0.80 0.80  Sodium 135 - 145 mmol/L 141 140 140  Potassium 3.5 - 5.1 mmol/L 3.6 3.6 3.7  Chloride 98 - 111 mmol/L 106 105 103  CO2 22 - 32 mmol/L 24 24 27   Calcium 8.9 - 10.3 mg/dL 8.3(L) 8.6(L) 8.8(L)  Total Protein 6.5 - 8.1 g/dL 5.8(L) 5.7(L) 5.6(L)  Total Bilirubin 0.3 - 1.2 mg/dL 0.4 0.4 0.5  Alkaline Phos 38 - 126 U/L 75 77 66  AST 15 - 41 U/L 18 21 14(L)  ALT 0 - 44 U/L 16 26 20       RADIOGRAPHIC STUDIES: I have personally reviewed the radiological images as listed and agreed with the findings in the report. No results found.   ASSESSMENT & PLAN:  John [name] is a 75 y.o. male with    1.Diffuse large B cell lymphoma, ABC Subtype, stage IV  -After worsening fatigue pt presented to  ED. 04/20/20 CT CAP showedlargemediastinal mass and diffuseliver masses, and diffuse retroperitoneal lymphadenopathy. -Liver biopsy from 04/23/20 shows diffuse large B-Cell lymphoma,ABC subtype, which carries a worse prognosis. Given his liver metastasis and LN involvement, this is stage IV. His lymphoma FISH panel isnegative fordouble hit lymphoma -I started him onInpatient R-EPOCHq3weeksfor 6 cycleson11/23/21.Given hishigh risk for CNS involvement,I startedintrathecal treatmentwith methotrexateprophylacticallyfromC2. -S/p C3 he had excellent partial response on 07/04/20 PET scan.  -S/p C6 he tolerated well with mouth sore, minimal tingling in his fingertips and fatigue. He did have drop in blood counts and required blood transfusion. He has improved since.  -I discussed proceeding with surveillance for 5 years. Plan to scan with PET in about 6 weeks months. If he has residual lesions, I may recommend Radiation. He can keep port for 1-2 years given risk of recurrence. Will continue port flushes every 6-8 weeks. Plan to repeat Echo later in 2022.  -He is fine to stop Allopurinol and protonix in 4 weeks.  -F/u in 6 weeks with scan results   2.  Pancytopenia, secondary to chemo -He was given GCSF after Cycle 1.He still had pancytopenia with neutropenic fever. Hewas treated as inpatient and required blood and platelet transfusion. -Helastrequired blood transfusion on 06/14/20 and 09/04/20.  -He received Evusheld vaccine on 08/16/20 and 08/17/20. -Labs s/p C6 reviewed, Hg 9.6, plt 91K (09/07/20).   3. Fatigue, Low appetite,and weight loss -secondary to #1and chemotherapy -overall much better, he has been gaining weight back -Stable on treatment, weight maintained.    PLAN: -Stop allopurinol and protonix in 4 weeks -F/u in 6 weeks with lab, flush and PET scan a few days before     No problem-specific Assessment & Plan notes found for this encounter.   Orders Placed This Encounter  Procedures  . NM PET Image Restag (PS) Skull Base To Thigh    Standing Status:   Future    Standing Expiration Date:   09/07/2021    Order Specific Question:   If indicated for the ordered procedure, I authorize the administration of a radiopharmaceutical per Radiology protocol    Answer:   Yes    Order Specific Question:   Preferred imaging location?    Answer:   [facility]   All questions were answered. The patient knows to call the clinic with any problems, questions or concerns. No barriers to learning was detected. The total time spent in the appointment was 30 minutes.     Truitt Merle, MD 09/07/2020   I, Joslyn Devon, am acting as scribe for Truitt Merle, MD.   I have reviewed the above documentation for accuracy and completeness, and I agree with the above.

## 2020-09-07 ENCOUNTER — Inpatient Hospital Stay: Payer: Medicare HMO

## 2020-09-07 ENCOUNTER — Other Ambulatory Visit: Payer: Self-pay

## 2020-09-07 ENCOUNTER — Encounter: Payer: Self-pay | Admitting: Nurse Practitioner

## 2020-09-07 ENCOUNTER — Encounter: Payer: Self-pay | Admitting: Hematology

## 2020-09-07 ENCOUNTER — Inpatient Hospital Stay: Payer: Medicare HMO | Attending: Hematology | Admitting: Hematology

## 2020-09-07 VITALS — BP 121/79 | HR 87 | Temp 97.7°F | Resp 19 | Ht 71.0 in | Wt 212.9 lb

## 2020-09-07 DIAGNOSIS — Z9221 Personal history of antineoplastic chemotherapy: Secondary | ICD-10-CM | POA: Insufficient documentation

## 2020-09-07 DIAGNOSIS — D649 Anemia, unspecified: Secondary | ICD-10-CM | POA: Diagnosis not present

## 2020-09-07 DIAGNOSIS — Z95828 Presence of other vascular implants and grafts: Secondary | ICD-10-CM

## 2020-09-07 DIAGNOSIS — C8338 Diffuse large B-cell lymphoma, lymph nodes of multiple sites: Secondary | ICD-10-CM

## 2020-09-07 DIAGNOSIS — D61810 Antineoplastic chemotherapy induced pancytopenia: Secondary | ICD-10-CM | POA: Insufficient documentation

## 2020-09-07 DIAGNOSIS — R634 Abnormal weight loss: Secondary | ICD-10-CM | POA: Diagnosis not present

## 2020-09-07 DIAGNOSIS — C787 Secondary malignant neoplasm of liver and intrahepatic bile duct: Secondary | ICD-10-CM | POA: Insufficient documentation

## 2020-09-07 DIAGNOSIS — R5383 Other fatigue: Secondary | ICD-10-CM | POA: Insufficient documentation

## 2020-09-07 DIAGNOSIS — R590 Localized enlarged lymph nodes: Secondary | ICD-10-CM | POA: Insufficient documentation

## 2020-09-07 DIAGNOSIS — C8339 Diffuse large B-cell lymphoma, extranodal and solid organ sites: Secondary | ICD-10-CM | POA: Diagnosis present

## 2020-09-07 LAB — CMP (CANCER CENTER ONLY)
ALT: 16 U/L (ref 0–44)
AST: 18 U/L (ref 15–41)
Albumin: 3.3 g/dL — ABNORMAL LOW (ref 3.5–5.0)
Alkaline Phosphatase: 75 U/L (ref 38–126)
Anion gap: 11 (ref 5–15)
BUN: 16 mg/dL (ref 8–23)
CO2: 24 mmol/L (ref 22–32)
Calcium: 8.3 mg/dL — ABNORMAL LOW (ref 8.9–10.3)
Chloride: 106 mmol/L (ref 98–111)
Creatinine: 0.79 mg/dL (ref 0.61–1.24)
GFR, Estimated: >60 mL/min (ref >60)
Glucose, Bld: 140 mg/dL — ABNORMAL HIGH (ref 70–99)
Potassium: 3.6 mmol/L (ref 3.5–5.1)
Sodium: 141 mmol/L (ref 135–145)
Total Bilirubin: 0.4 mg/dL (ref 0.3–1.2)
Total Protein: 5.8 g/dL — ABNORMAL LOW (ref 6.5–8.1)

## 2020-09-07 LAB — CBC WITH DIFFERENTIAL (CANCER CENTER ONLY)
Abs Immature Granulocytes: 0.32 10*3/uL — ABNORMAL HIGH (ref 0.00–0.07)
Basophils Absolute: 0 10*3/uL (ref 0.0–0.1)
Basophils Relative: 1 %
Eosinophils Absolute: 0 10*3/uL (ref 0.0–0.5)
Eosinophils Relative: 1 %
HCT: 29.2 % — ABNORMAL LOW (ref 39.0–52.0)
Hemoglobin: 9.6 g/dL — ABNORMAL LOW (ref 13.0–17.0)
Immature Granulocytes: 6 %
Lymphocytes Relative: 5 %
Lymphs Abs: 0.3 10*3/uL — ABNORMAL LOW (ref 0.7–4.0)
MCH: 30.4 pg (ref 26.0–34.0)
MCHC: 32.9 g/dL (ref 30.0–36.0)
MCV: 92.4 fL (ref 80.0–100.0)
Monocytes Absolute: 0.8 10*3/uL (ref 0.1–1.0)
Monocytes Relative: 14 %
Neutro Abs: 4.3 10*3/uL (ref 1.7–7.7)
Neutrophils Relative %: 73 %
Platelet Count: 91 10*3/uL — ABNORMAL LOW (ref 150–400)
RBC: 3.16 MIL/uL — ABNORMAL LOW (ref 4.22–5.81)
RDW: 16.4 % — ABNORMAL HIGH (ref 11.5–15.5)
WBC Count: 5.7 10*3/uL (ref 4.0–10.5)
nRBC: 0 % (ref 0.0–0.2)

## 2020-09-07 MED ORDER — HEPARIN SOD (PORK) LOCK FLUSH 100 UNIT/ML IV SOLN
500.0000 [IU] | Freq: Once | INTRAVENOUS | Status: AC
  Administered 2020-09-07: 500 [IU] via INTRAVENOUS
  Filled 2020-09-07: qty 5

## 2020-09-07 MED ORDER — SODIUM CHLORIDE 0.9% FLUSH
10.0000 mL | Freq: Once | INTRAVENOUS | Status: AC
  Administered 2020-09-07: 10 mL via INTRAVENOUS
  Filled 2020-09-07: qty 10

## 2020-09-10 ENCOUNTER — Other Ambulatory Visit: Payer: Self-pay

## 2020-09-10 ENCOUNTER — Telehealth: Payer: Self-pay | Admitting: Hematology

## 2020-09-10 NOTE — Telephone Encounter (Signed)
Scheduled follow-up appointment per 4/1 los. Patient is aware. 

## 2020-09-10 NOTE — Telephone Encounter (Signed)
Scheduled follow-up appointments per 4/1 los. Patient is aware.

## 2020-09-19 ENCOUNTER — Encounter: Payer: Self-pay | Admitting: Nurse Practitioner

## 2020-10-03 ENCOUNTER — Encounter (HOSPITAL_COMMUNITY)
Admission: RE | Admit: 2020-10-03 | Discharge: 2020-10-03 | Disposition: A | Discharge status: Home / Self Care | Payer: Medicare HMO | Source: Ambulatory Visit | Attending: Hematology | Admitting: Hematology

## 2020-10-03 ENCOUNTER — Other Ambulatory Visit: Payer: Self-pay

## 2020-10-03 DIAGNOSIS — N400 Enlarged prostate without lower urinary tract symptoms: Secondary | ICD-10-CM | POA: Insufficient documentation

## 2020-10-03 DIAGNOSIS — I2510 Atherosclerotic heart disease of native coronary artery without angina pectoris: Secondary | ICD-10-CM | POA: Insufficient documentation

## 2020-10-03 DIAGNOSIS — I700 Atherosclerosis of aorta: Secondary | ICD-10-CM | POA: Insufficient documentation

## 2020-10-03 DIAGNOSIS — R160 Hepatomegaly, not elsewhere classified: Secondary | ICD-10-CM | POA: Insufficient documentation

## 2020-10-03 DIAGNOSIS — C8338 Diffuse large B-cell lymphoma, lymph nodes of multiple sites: Secondary | ICD-10-CM | POA: Insufficient documentation

## 2020-10-03 LAB — GLUCOSE, CAPILLARY: Glucose-Capillary: 126 mg/dL — ABNORMAL HIGH (ref 70–99)

## 2020-10-03 MED ORDER — FLUDEOXYGLUCOSE F - 18 (FDG) INJECTION
11.0000 | Freq: Once | INTRAVENOUS | Status: AC | PRN
  Administered 2020-10-03: 10.6 via INTRAVENOUS

## 2020-10-08 ENCOUNTER — Telehealth: Payer: Self-pay

## 2020-10-08 NOTE — Telephone Encounter (Signed)
-----   Message from Truitt Merle, MD sent at 10/06/2020 11:26 PM EDT ----- Please let pt know his PET scan looks great, no residual lymphoma. If he wants, OK to move his lab and f/u to this week,please change his lab appointment to same day with OV, thanks   Truitt Merle  10/06/2020

## 2020-10-08 NOTE — Telephone Encounter (Signed)
Called made pt aware of PET scan results offered to adjust appointments pt refused stating he would like to keep his schedule as is  Encouraged to call for questions concerns or changes

## 2020-10-15 NOTE — Progress Notes (Signed)
[place]   [phone] Fax:([phone]   Clinic Follow up Note   Patient Care Team: Christain Sacramento, MD as PCP - General (Family Medicine)  Date of Service:  10/19/2020  CHIEF COMPLAINT: F/u ofDiffuse large B cell lymphoma  SUMMARY OF ONCOLOGIC HISTORY: Oncology History Overview Note  Cancer Staging Diffuse large B cell lymphoma ([place]) Staging form: Hodgkin and Non-Hodgkin Lymphoma, AJCC 8th Edition - Clinical stage from 04/23/2020: Stage IV (Diffuse large B-cell lymphoma) - Signed by Truitt Merle, MD on 04/29/2020    Diffuse large B cell lymphoma ([place])  04/20/2020 Imaging   CT CAP  IMPRESSION: 1. Large anterior mediastinal/prevascular space mass/adenopathy. The mass extends superiorly and abuts the upper trachea. 2. Large hypodense lesion in the right lobe of the liver as well as ill-defined splenic hypodense lesions. Further characterization with MRI without and with contrast recommended. The liver lesion is amenable to percutaneous tissue sampling. 3. Extensive mesenteric and retroperitoneal adenopathy. 4. Sigmoid diverticulosis. No bowel obstruction. Normal appendix. 5. Aortic Atherosclerosis (ICD10-I70.0).   04/23/2020 Cancer Staging   Staging form: Hodgkin and Non-Hodgkin Lymphoma, AJCC 8th Edition - Clinical stage from 04/23/2020: Stage IV (Diffuse large B-cell lymphoma) - Signed by Truitt Merle, MD on 04/29/2020   04/23/2020 Initial Biopsy   FINAL MICROSCOPIC DIAGNOSIS:   A. LIVER, RIGHT MASS, NEEDLE CORE BIOPSY:  - Large B-cell lymphoma  - See comment     COMMENT:   The sections show needle core biopsy fragments displaying effacement of  the architecture by a dense infiltrate of primarily large atypical  lymphoid appearing cells displaying vesicular chromatin and small  nucleoli.  This is associated with apoptosis and brisk mitosis.  The  appearance is diffuse with lack of atypical follicles.  A large battery  of  immunohistochemical stains was performed and shows that the atypical  lymphoid cells are positive for LCA, CD20, PAX 5, BCL-2, BCL6, CD5,  MUM-1 (partial).  The atypical lymphoid cells are negative for CD10,  CD30, CD34, CD138, cyclin D1, TdT, and EBV in situ hybridization,  synaptophysin, cytokeratin AE1/AE3, cytokeratin 20, cytokeratin 7,  cytokeratin 5/6, TTF-1, CD56, CDX2, and p40.  There is an admixed  relatively minor population of T-cells as primarily seen with CD3.  The  overall features are consistent with large B-cell lymphoma, ABC subtype.  The lymphomatous process displays expression of CD5.  This phenotype is  seen in 5-10% of mostly de novo large B-cell lymphoma cases or rarely as  a transformation of a low-grade B-cell lymphoma such as small  lymphocytic lymphoma.  Clinical correlation is recommended.    04/25/2020 Imaging   CT AP  IMPRESSION: 1. Development of high-density ascites in the abdomen and pelvis. Findings are most compatible with a small to moderate amount of hemoperitoneum and related to the recent liver biopsy. 2. Extensive lymphadenopathy throughout the abdomen and pelvis with a large mass or nodal mass near the pancreatic head. There are additional lesions involving the liver and spleen. Findings are suggestive for metastatic disease. 3. Development of small bilateral pleural effusions.   These results were called by telephone at the time of interpretation on 04/25/2020 at 1:43 pm to provider ERIC [place] who verbally acknowledged these results.   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)   05/01/2020 - 08/27/2020 Chemotherapy   Inpatient R-EPOCH q3weeks for 6 cycles starting 05/01/20 with intrathecal prophylactic treatment starting with C2. Completed      07/04/2020 PET scan  IMPRESSION: 1. Marked reduction in volume of anterior mediastinal mass now with low metabolic activity ( [place] 2). 2. Reduction in volume  of retroperitoneal nodal mass adjacent the pancreas with low metabolic activity ( [place] 3). 3. No increased metabolic activity associated with the spleen 4. Interval contraction of hepatic hematoma post biopsy. No hypermetabolic elements within the hepatic lesion. 5. Grade 2 anterolisthesis L4 on L5.     10/03/2020 Imaging   PET IMPRESSION: 1. No evidence of residual or recurrent lymphoma. 2. Heterogeneous bilobed mass in the liver, unchanged and reportedly due to chronic post biopsy hematomas. 3. Enlarged prostate.  Associated bladder wall thickening. 4. Aortic atherosclerosis (ICD10-I70.0). Coronary artery calcification.        CURRENT THERAPY:  Surveillance   INTERVAL HISTORY:  John [name] is here for a follow up. He was last seen by me 1 month ago. He presents to the clinic with his wife. He notes he is recovering from treatment well. He notes his energy is now 70-80%.  He notes he has had second booster for COVID. He notes he is eating adequately. He notes residual tingling in fingertips with only minimal deficit in function. I reviewed his medication list with him.  He does note small bumps on scalp a few months ago. He and his wife expressed gratitude for help in treating his lymphoma.     REVIEW OF SYSTEMS:   Constitutional: Denies fevers, chills or abnormal weight loss Eyes: Denies blurriness of vision Ears, nose, mouth, throat, and face: Denies mucositis or sore throat Respiratory: Denies cough, dyspnea or wheezes Cardiovascular: Denies palpitation, chest discomfort or lower extremity swelling Gastrointestinal:  Denies nausea, heartburn or change in bowel habits Skin: Denies abnormal skin rashes (+) Small scalp lesions  Lymphatics: Denies new lymphadenopathy or easy bruising Neurological:Denies numbness, tingling or new weaknesses Behavioral/Psych: Mood is stable, no new changes  All other systems were reviewed with the patient and are negative.  MEDICAL  HISTORY:  Past Medical History:  Diagnosis Date  . Cancer ([place])   . Goals of care, counseling/discussion 05/06/2020  . Hypertension   . Knee pain, right     SURGICAL HISTORY: Past Surgical History:  Procedure Laterality Date  . FOOT SURGERY    . IR IMAGING GUIDED PORT INSERTION  05/28/2020  . TONSILLECTOMY      I have reviewed the social history and family history with the patient and they are unchanged from previous note.  ALLERGIES:  has No Known Allergies.  MEDICATIONS:  Current Outpatient Medications  Medication Sig Dispense Refill  . allopurinol (ZYLOPRIM) 100 MG tablet TAKE 1 TABLET(100 MG) BY MOUTH DAILY (Patient taking differently: Take 100 mg by mouth daily.) 90 tablet 1  . Ascorbic Acid (VITAMIN C WITH ROSE HIPS) 1000 MG tablet Take 1,000 mg by mouth daily.    Marland Kitchen doxazosin (CARDURA) 4 MG tablet Take 4 mg by mouth daily.    . feeding supplement (BOOST HIGH PROTEIN) LIQD Take 237 mLs by mouth every other day.    . lidocaine-prilocaine (EMLA) cream Apply 1 application topically as needed. (Patient taking differently: Apply 1 application topically daily as needed (pain).) 30 g 0  . magic mouthwash w/lidocaine SOLN Take 15 mLs by mouth 4 (four) times daily as needed for mouth pain. 400 mL 0  . melatonin 3 MG TABS tablet Take 2 tablets (6 mg total) by mouth at bedtime as needed (sleep). (Patient taking differently: Take 6 mg by mouth at bedtime.) 30 tablet 0  .  metoprolol tartrate (LOPRESSOR) 25 MG tablet Take 0.5 tablets (12.5 mg total) by mouth 2 (two) times daily. (Patient taking differently: Take 12.5 mg by mouth in the morning.) 30 tablet 0  . Multiple Vitamins-Minerals (MULTIVITAMIN PO) Take 1 tablet by mouth daily.    . Nutritional Supplements (BOOST MAX PROTEIN PO) Take 237 mLs by mouth daily. Chocolate    . Omega-3 Fatty Acids (OMEGA 3 PO) Take 1 tablet by mouth daily.    . ondansetron (ZOFRAN) 8 MG tablet Take 1 tablet (8 mg total) by mouth every 8 (eight) hours as  needed for nausea or vomiting. 20 tablet 0  . pantoprazole (PROTONIX) 40 MG tablet Take 1 tablet (40 mg total) by mouth daily. 30 tablet 3  . polyethylene glycol (MIRALAX / GLYCOLAX) 17 g packet Take 17 g by mouth daily as needed for mild constipation.    . prochlorperazine (COMPAZINE) 10 MG tablet Take 1 tablet (10 mg total) by mouth every 6 (six) hours as needed for nausea or vomiting. 30 tablet 0  . senna-docusate (SENOKOT-S) 8.6-50 MG tablet Take 2 tablets by mouth 2 (two) times daily as needed for mild constipation. (Patient taking differently: Take 2 tablets by mouth 2 (two) times daily.)    . simethicone (MYLICON) 80 MG chewable tablet Chew 1 tablet (80 mg total) by mouth 4 (four) times daily as needed for flatulence. (Patient not taking: No sig reported) 30 tablet 0  . Sodium Chloride-Sodium Bicarb (SODIUM BICARBONATE/SODIUM CHLORIDE) SOLN 1 application by Mouth Rinse route as needed for dry mouth or mouth pain.     No current facility-administered medications for this visit.    PHYSICAL EXAMINATION: ECOG PERFORMANCE STATUS: 1 - Symptomatic but completely ambulatory  Vitals:   10/19/20 0844  BP: 137/63  Pulse: 86  Resp: 18  Temp: 97.7 F (36.5 C)  SpO2: 98%   Filed Weights   10/19/20 0844  Weight: 222 lb 3.2 oz (100.8 kg)    GENERAL:alert, no distress and comfortable SKIN: skin color, texture, turgor are normal (+) Small scalp lesions  EYES: normal, Conjunctiva are pink and non-injected, sclera clear  NECK: supple, thyroid normal size, non-tender, without nodularity LYMPH:  no palpable lymphadenopathy in the cervical, axillary  LUNGS: clear to auscultation and percussion with normal breathing effort HEART: regular rate & rhythm and no murmurs and no lower extremity edema ABDOMEN:abdomen soft, non-tender and normal bowel sounds Musculoskeletal:no cyanosis of digits and no clubbing  NEURO: alert & oriented x 3 with fluent speech, no focal motor/sensory  deficits  LABORATORY DATA:  I have reviewed the data as listed CBC Latest Ref Rng & Units 10/17/2020 09/07/2020 09/03/2020  WBC 4.0 - 10.5 K/uL 3.2(L) 5.7 5.0  Hemoglobin 13.0 - 17.0 g/dL 11.1(L) 9.6(L) 9.4(L)  Hematocrit 39.0 - 52.0 % 32.6(L) 29.2(L) 28.5(L)  Platelets 150 - 400 K/uL 56(L) 91(L) 69(L)     CMP Latest Ref Rng & Units 10/17/2020 09/07/2020 09/03/2020  Glucose 70 - 99 mg/dL 120(H) 140(H) 144(H)  BUN 8 - 23 mg/dL 19 16 11   Creatinine 0.61 - 1.24 mg/dL 0.78 0.79 0.80  Sodium 135 - 145 mmol/L 142 141 140  Potassium 3.5 - 5.1 mmol/L 3.9 3.6 3.6  Chloride 98 - 111 mmol/L 106 106 105  CO2 22 - 32 mmol/L 26 24 24   Calcium 8.9 - 10.3 mg/dL 9.5 8.3(L) 8.6(L)  Total Protein 6.5 - 8.1 g/dL 6.0(L) 5.8(L) 5.7(L)  Total Bilirubin 0.3 - 1.2 mg/dL 0.6 0.4 0.4  Alkaline Phos 38 -  126 U/L 61 75 77  AST 15 - 41 U/L 22 18 21   ALT 0 - 44 U/L 16 16 26       RADIOGRAPHIC STUDIES: I have personally reviewed the radiological images as listed and agreed with the findings in the report. No results found.   ASSESSMENT & PLAN:  John [name] is a 75 y.o. male with    1.Diffuse large B cell lymphoma, ABC Subtype, stage IV  -After worsening fatigue pt presented to ED. 04/20/20 CT CAP showedlargemediastinal mass and diffuseliver masses, and diffuse retroperitoneal lymphadenopathy. -Liver biopsy from 04/23/20 shows diffuse large B-Cell lymphoma,ABC subtype, which carries a worse prognosis. Given his liver metastasis and LN involvement, this is stage IV. His lymphoma FISH panel isnegative fordouble hit lymphoma -He completed first-line Inpatient R-EPOCHq3weeksfor 6 cycleson11/23/21-3/21/22.Given hishigh risk for CNS involvement,treatment involved intrathecal treatmentwith methotrexate prophylactically from C2. He is currently on surveillance/observation.  -His 10/03/20 PET showed No evidence of residual or recurrent lymphoma and  Heterogeneous bilobed mass in the liver, unchanged and is  likely post biopsy hematomas. I reviewed scan images myself and discussed with patient today.  -He is recovering well from treatment. He is gaining back weight well. His energy mostly recovered and he has residual tingling in fingertips. Labs reviewed, WBC 3.2, Hg 11.1, 56K. Physical exam was unremarkable except small scalp lesions. I recommend he see dermatologist about this for further evaluation. .  -Continue surveillance.  We discussed surveillance plan, routine lab, follow-up and exam every 3 to 6 months for 5 years, CT or PET scan every 6 months for the first 2 years, he can keep port for 1-2 years given risk of recurrence. Will continue port flushes every 6-8 weeks. Plan to repeat Echo later in 2022.  -F/u in 3 months.   2. Thrombocytopenia, secondary to chemo -He was given GCSF after Cycle 1 due to significant pancytopenia with neutropenic fever. Hewas treated as inpatient and required blood and platelet transfusion. -Helastrequired blood transfusion on 06/14/20 and 09/04/20.  -He received Evusheld vaccine on 08/16/20 and 08/17/20. He has received his second COVID Booster.  -S/p treatment, his labs improved except platelets remain low at 56K. Will repeat labs in next month.    PLAN: -Scan reviewed, he had a complete radiographic response.   -port flush and labs in 6 weeks, follow-up with thrombocytopenia.  If platelet counts improved to above 100, restart baby aspirin. -Lab, flush, f/u in 3 months    No problem-specific Assessment & Plan notes found for this encounter.   No orders of the defined types were placed in this encounter.  All questions were answered. The patient knows to call the clinic with any problems, questions or concerns. No barriers to learning was detected. The total time spent in the appointment was 30 minutes.     Truitt Merle, MD 10/19/2020   I, Joslyn Devon, am acting as scribe for Truitt Merle, MD.   I have reviewed the above documentation for accuracy and  completeness, and I agree with the above.

## 2020-10-17 ENCOUNTER — Other Ambulatory Visit: Payer: Self-pay

## 2020-10-17 ENCOUNTER — Inpatient Hospital Stay: Payer: Medicare HMO | Attending: Hematology

## 2020-10-17 ENCOUNTER — Other Ambulatory Visit: Payer: Medicare HMO

## 2020-10-17 DIAGNOSIS — C8339 Diffuse large B-cell lymphoma, extranodal and solid organ sites: Secondary | ICD-10-CM | POA: Diagnosis not present

## 2020-10-17 DIAGNOSIS — Z95828 Presence of other vascular implants and grafts: Secondary | ICD-10-CM

## 2020-10-17 DIAGNOSIS — D649 Anemia, unspecified: Secondary | ICD-10-CM | POA: Insufficient documentation

## 2020-10-17 DIAGNOSIS — C787 Secondary malignant neoplasm of liver and intrahepatic bile duct: Secondary | ICD-10-CM | POA: Insufficient documentation

## 2020-10-17 DIAGNOSIS — D61810 Antineoplastic chemotherapy induced pancytopenia: Secondary | ICD-10-CM | POA: Diagnosis not present

## 2020-10-17 DIAGNOSIS — Z9221 Personal history of antineoplastic chemotherapy: Secondary | ICD-10-CM | POA: Diagnosis not present

## 2020-10-17 LAB — CBC WITH DIFFERENTIAL (CANCER CENTER ONLY)
Abs Immature Granulocytes: 0.11 10*3/uL — ABNORMAL HIGH (ref 0.00–0.07)
Basophils Absolute: 0 10*3/uL (ref 0.0–0.1)
Basophils Relative: 1 %
Eosinophils Absolute: 0.2 10*3/uL (ref 0.0–0.5)
Eosinophils Relative: 5 %
HCT: 32.6 % — ABNORMAL LOW (ref 39.0–52.0)
Hemoglobin: 11.1 g/dL — ABNORMAL LOW (ref 13.0–17.0)
Immature Granulocytes: 4 %
Lymphocytes Relative: 7 %
Lymphs Abs: 0.2 10*3/uL — ABNORMAL LOW (ref 0.7–4.0)
MCH: 31 pg (ref 26.0–34.0)
MCHC: 34 g/dL (ref 30.0–36.0)
MCV: 91.1 fL (ref 80.0–100.0)
Monocytes Absolute: 0.4 10*3/uL (ref 0.1–1.0)
Monocytes Relative: 13 %
Neutro Abs: 2.3 10*3/uL (ref 1.7–7.7)
Neutrophils Relative %: 70 %
Platelet Count: 56 10*3/uL — ABNORMAL LOW (ref 150–400)
RBC: 3.58 MIL/uL — ABNORMAL LOW (ref 4.22–5.81)
RDW: 14.2 % (ref 11.5–15.5)
WBC Count: 3.2 10*3/uL — ABNORMAL LOW (ref 4.0–10.5)
nRBC: 0 % (ref 0.0–0.2)

## 2020-10-17 LAB — CMP (CANCER CENTER ONLY)
ALT: 16 U/L (ref 0–44)
AST: 22 U/L (ref 15–41)
Albumin: 3.6 g/dL (ref 3.5–5.0)
Alkaline Phosphatase: 61 U/L (ref 38–126)
Anion gap: 10 (ref 5–15)
BUN: 19 mg/dL (ref 8–23)
CO2: 26 mmol/L (ref 22–32)
Calcium: 9.5 mg/dL (ref 8.9–10.3)
Chloride: 106 mmol/L (ref 98–111)
Creatinine: 0.78 mg/dL (ref 0.61–1.24)
GFR, Estimated: >60 mL/min (ref >60)
Glucose, Bld: 120 mg/dL — ABNORMAL HIGH (ref 70–99)
Potassium: 3.9 mmol/L (ref 3.5–5.1)
Sodium: 142 mmol/L (ref 135–145)
Total Bilirubin: 0.6 mg/dL (ref 0.3–1.2)
Total Protein: 6 g/dL — ABNORMAL LOW (ref 6.5–8.1)

## 2020-10-17 LAB — LACTATE DEHYDROGENASE: LDH: 208 U/L — ABNORMAL HIGH (ref 98–192)

## 2020-10-17 LAB — URIC ACID: Uric Acid, Serum: 7 mg/dL (ref 3.7–8.6)

## 2020-10-17 MED ORDER — SODIUM CHLORIDE 0.9% FLUSH
10.0000 mL | INTRAVENOUS | Status: DC | PRN
  Administered 2020-10-17: 10 mL via INTRAVENOUS
  Filled 2020-10-17: qty 10

## 2020-10-17 MED ORDER — HEPARIN SOD (PORK) LOCK FLUSH 100 UNIT/ML IV SOLN
500.0000 [IU] | Freq: Once | INTRAVENOUS | Status: AC
  Administered 2020-10-17: 500 [IU] via INTRAVENOUS
  Filled 2020-10-17: qty 5

## 2020-10-19 ENCOUNTER — Telehealth: Payer: Self-pay | Admitting: Hematology

## 2020-10-19 ENCOUNTER — Inpatient Hospital Stay (HOSPITAL_BASED_OUTPATIENT_CLINIC_OR_DEPARTMENT_OTHER): Payer: Medicare HMO | Admitting: Hematology

## 2020-10-19 ENCOUNTER — Encounter: Payer: Self-pay | Admitting: Hematology

## 2020-10-19 ENCOUNTER — Other Ambulatory Visit: Payer: Self-pay

## 2020-10-19 VITALS — BP 137/63 | HR 86 | Temp 97.7°F | Resp 18 | Ht 71.0 in | Wt 222.2 lb

## 2020-10-19 DIAGNOSIS — C8339 Diffuse large B-cell lymphoma, extranodal and solid organ sites: Secondary | ICD-10-CM | POA: Diagnosis not present

## 2020-10-19 DIAGNOSIS — C8338 Diffuse large B-cell lymphoma, lymph nodes of multiple sites: Secondary | ICD-10-CM

## 2020-10-19 NOTE — Telephone Encounter (Signed)
Scheduled follow-up appointments per 5/13 los. Patient is aware. 

## 2020-11-02 ENCOUNTER — Encounter: Payer: Self-pay | Admitting: Nurse Practitioner

## 2020-11-29 ENCOUNTER — Other Ambulatory Visit: Payer: Self-pay

## 2020-11-29 ENCOUNTER — Inpatient Hospital Stay: Payer: Medicare HMO | Attending: Hematology

## 2020-11-29 DIAGNOSIS — Z9221 Personal history of antineoplastic chemotherapy: Secondary | ICD-10-CM | POA: Diagnosis not present

## 2020-11-29 DIAGNOSIS — C787 Secondary malignant neoplasm of liver and intrahepatic bile duct: Secondary | ICD-10-CM | POA: Diagnosis not present

## 2020-11-29 DIAGNOSIS — C8339 Diffuse large B-cell lymphoma, extranodal and solid organ sites: Secondary | ICD-10-CM | POA: Insufficient documentation

## 2020-11-29 DIAGNOSIS — Z95828 Presence of other vascular implants and grafts: Secondary | ICD-10-CM

## 2020-11-29 DIAGNOSIS — D649 Anemia, unspecified: Secondary | ICD-10-CM | POA: Diagnosis not present

## 2020-11-29 DIAGNOSIS — D61810 Antineoplastic chemotherapy induced pancytopenia: Secondary | ICD-10-CM | POA: Insufficient documentation

## 2020-11-29 LAB — CBC WITH DIFFERENTIAL (CANCER CENTER ONLY)
Abs Immature Granulocytes: 0.37 10*3/uL — ABNORMAL HIGH (ref 0.00–0.07)
Basophils Absolute: 0 10*3/uL (ref 0.0–0.1)
Basophils Relative: 1 %
Eosinophils Absolute: 0.1 10*3/uL (ref 0.0–0.5)
Eosinophils Relative: 2 %
HCT: 35 % — ABNORMAL LOW (ref 39.0–52.0)
Hemoglobin: 12 g/dL — ABNORMAL LOW (ref 13.0–17.0)
Immature Granulocytes: 8 %
Lymphocytes Relative: 6 %
Lymphs Abs: 0.3 10*3/uL — ABNORMAL LOW (ref 0.7–4.0)
MCH: 29.9 pg (ref 26.0–34.0)
MCHC: 34.3 g/dL (ref 30.0–36.0)
MCV: 87.3 fL (ref 80.0–100.0)
Monocytes Absolute: 0.4 10*3/uL (ref 0.1–1.0)
Monocytes Relative: 10 %
Neutro Abs: 3.3 10*3/uL (ref 1.7–7.7)
Neutrophils Relative %: 73 %
Platelet Count: 64 10*3/uL — ABNORMAL LOW (ref 150–400)
RBC: 4.01 MIL/uL — ABNORMAL LOW (ref 4.22–5.81)
RDW: 13.4 % (ref 11.5–15.5)
WBC Count: 4.5 10*3/uL (ref 4.0–10.5)
nRBC: 0 % (ref 0.0–0.2)

## 2020-11-29 LAB — CMP (CANCER CENTER ONLY)
ALT: 17 U/L (ref 0–44)
AST: 22 U/L (ref 15–41)
Albumin: 3.5 g/dL (ref 3.5–5.0)
Alkaline Phosphatase: 57 U/L (ref 38–126)
Anion gap: 9 (ref 5–15)
BUN: 20 mg/dL (ref 8–23)
CO2: 25 mmol/L (ref 22–32)
Calcium: 9 mg/dL (ref 8.9–10.3)
Chloride: 107 mmol/L (ref 98–111)
Creatinine: 0.77 mg/dL (ref 0.61–1.24)
GFR, Estimated: >60 mL/min (ref >60)
Glucose, Bld: 115 mg/dL — ABNORMAL HIGH (ref 70–99)
Potassium: 3.9 mmol/L (ref 3.5–5.1)
Sodium: 141 mmol/L (ref 135–145)
Total Bilirubin: 0.6 mg/dL (ref 0.3–1.2)
Total Protein: 6.1 g/dL — ABNORMAL LOW (ref 6.5–8.1)

## 2020-11-29 LAB — URIC ACID: Uric Acid, Serum: 7.5 mg/dL (ref 3.7–8.6)

## 2020-11-29 LAB — LACTATE DEHYDROGENASE: LDH: 254 U/L — ABNORMAL HIGH (ref 98–192)

## 2020-11-29 MED ORDER — HEPARIN SOD (PORK) LOCK FLUSH 100 UNIT/ML IV SOLN
500.0000 [IU] | Freq: Once | INTRAVENOUS | Status: AC
  Administered 2020-11-29: 500 [IU] via INTRAVENOUS
  Filled 2020-11-29: qty 5

## 2020-11-29 MED ORDER — SODIUM CHLORIDE 0.9% FLUSH
10.0000 mL | INTRAVENOUS | Status: DC | PRN
  Administered 2020-11-29: 10 mL via INTRAVENOUS
  Filled 2020-11-29: qty 10

## 2020-12-04 ENCOUNTER — Encounter: Payer: Self-pay | Admitting: Nurse Practitioner

## 2020-12-19 ENCOUNTER — Telehealth: Payer: Self-pay | Admitting: Hematology

## 2020-12-19 NOTE — Telephone Encounter (Signed)
Left message with rescheduled upcoming appointment due to provider's breast clinic. 

## 2021-01-09 ENCOUNTER — Other Ambulatory Visit: Payer: Medicare HMO

## 2021-01-09 ENCOUNTER — Other Ambulatory Visit: Payer: Self-pay

## 2021-01-09 ENCOUNTER — Inpatient Hospital Stay: Payer: Medicare HMO

## 2021-01-09 ENCOUNTER — Ambulatory Visit: Payer: Medicare HMO | Admitting: Hematology

## 2021-01-09 ENCOUNTER — Inpatient Hospital Stay: Payer: Medicare HMO | Attending: Hematology | Admitting: Hematology

## 2021-01-09 ENCOUNTER — Encounter: Payer: Self-pay | Admitting: Hematology

## 2021-01-09 VITALS — BP 132/62 | HR 66 | Temp 97.8°F | Resp 19 | Ht 71.0 in | Wt 218.7 lb

## 2021-01-09 DIAGNOSIS — D61810 Antineoplastic chemotherapy induced pancytopenia: Secondary | ICD-10-CM | POA: Diagnosis not present

## 2021-01-09 DIAGNOSIS — Z9221 Personal history of antineoplastic chemotherapy: Secondary | ICD-10-CM | POA: Diagnosis not present

## 2021-01-09 DIAGNOSIS — D649 Anemia, unspecified: Secondary | ICD-10-CM | POA: Diagnosis not present

## 2021-01-09 DIAGNOSIS — C8338 Diffuse large B-cell lymphoma, lymph nodes of multiple sites: Secondary | ICD-10-CM

## 2021-01-09 DIAGNOSIS — C787 Secondary malignant neoplasm of liver and intrahepatic bile duct: Secondary | ICD-10-CM | POA: Insufficient documentation

## 2021-01-09 DIAGNOSIS — Z452 Encounter for adjustment and management of vascular access device: Secondary | ICD-10-CM | POA: Diagnosis not present

## 2021-01-09 DIAGNOSIS — C8339 Diffuse large B-cell lymphoma, extranodal and solid organ sites: Secondary | ICD-10-CM | POA: Diagnosis present

## 2021-01-09 LAB — CBC WITH DIFFERENTIAL (CANCER CENTER ONLY)
Abs Immature Granulocytes: 0.05 10*3/uL (ref 0.00–0.07)
Basophils Absolute: 0 10*3/uL (ref 0.0–0.1)
Basophils Relative: 1 %
Eosinophils Absolute: 0.1 10*3/uL (ref 0.0–0.5)
Eosinophils Relative: 3 %
HCT: 37.6 % — ABNORMAL LOW (ref 39.0–52.0)
Hemoglobin: 12.9 g/dL — ABNORMAL LOW (ref 13.0–17.0)
Immature Granulocytes: 1 %
Lymphocytes Relative: 6 %
Lymphs Abs: 0.3 10*3/uL — ABNORMAL LOW (ref 0.7–4.0)
MCH: 29.5 pg (ref 26.0–34.0)
MCHC: 34.3 g/dL (ref 30.0–36.0)
MCV: 85.8 fL (ref 80.0–100.0)
Monocytes Absolute: 0.7 10*3/uL (ref 0.1–1.0)
Monocytes Relative: 16 %
Neutro Abs: 3.3 10*3/uL (ref 1.7–7.7)
Neutrophils Relative %: 73 %
Platelet Count: 68 10*3/uL — ABNORMAL LOW (ref 150–400)
RBC: 4.38 MIL/uL (ref 4.22–5.81)
RDW: 14.2 % (ref 11.5–15.5)
WBC Count: 4.4 10*3/uL (ref 4.0–10.5)
nRBC: 0 % (ref 0.0–0.2)

## 2021-01-09 LAB — CMP (CANCER CENTER ONLY)
ALT: 21 U/L (ref 0–44)
AST: 20 U/L (ref 15–41)
Albumin: 3.9 g/dL (ref 3.5–5.0)
Alkaline Phosphatase: 61 U/L (ref 38–126)
Anion gap: 10 (ref 5–15)
BUN: 23 mg/dL (ref 8–23)
CO2: 26 mmol/L (ref 22–32)
Calcium: 9.7 mg/dL (ref 8.9–10.3)
Chloride: 105 mmol/L (ref 98–111)
Creatinine: 0.95 mg/dL (ref 0.61–1.24)
GFR, Estimated: >60 mL/min (ref >60)
Glucose, Bld: 103 mg/dL — ABNORMAL HIGH (ref 70–99)
Potassium: 4.4 mmol/L (ref 3.5–5.1)
Sodium: 141 mmol/L (ref 135–145)
Total Bilirubin: 0.7 mg/dL (ref 0.3–1.2)
Total Protein: 6.5 g/dL (ref 6.5–8.1)

## 2021-01-09 LAB — URIC ACID: Uric Acid, Serum: 8.5 mg/dL (ref 3.7–8.6)

## 2021-01-09 LAB — LACTATE DEHYDROGENASE: LDH: 254 U/L — ABNORMAL HIGH (ref 98–192)

## 2021-01-09 MED ORDER — SODIUM CHLORIDE 0.9% FLUSH
10.0000 mL | INTRAVENOUS | Status: AC | PRN
  Filled 2021-01-09: qty 10

## 2021-01-09 MED ORDER — HEPARIN SOD (PORK) LOCK FLUSH 100 UNIT/ML IV SOLN
500.0000 [IU] | INTRAVENOUS | Status: AC | PRN
  Filled 2021-01-09: qty 5

## 2021-01-09 NOTE — Progress Notes (Signed)
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 Large anterior mediastinal/prevascular space mass/adenopathy. The mass extends superiorly and abuts the upper trachea. 2. Large hypodense lesion in the right lobe of the liver as well as ill-defined splenic hypodense lesions. Further characterization with MRI without and with contrast recommended. The liver lesion is amenable to percutaneous tissue sampling. 3. Extensive mesenteric and retroperitoneal adenopathy. 4. Sigmoid diverticulosis. No bowel obstruction. Normal appendix. 5. Aortic Atherosclerosis (ICD10-I70.0).   04/23/2020 Cancer Staging   Staging form: Hodgkin and Non-Hodgkin Lymphoma, AJCC 8th Edition - Clinical stage from 04/23/2020: Stage IV (Diffuse large B-cell lymphoma) - Signed by Truitt Merle, MD on 04/29/2020    04/23/2020 Initial Biopsy   FINAL MICROSCOPIC DIAGNOSIS:   A. LIVER, RIGHT MASS, NEEDLE CORE BIOPSY:  - Large B-cell lymphoma  - See comment     COMMENT:   The sections show needle core biopsy fragments displaying effacement of  the architecture by a dense infiltrate of primarily large atypical  lymphoid appearing cells displaying vesicular chromatin and small  nucleoli.  This is associated with apoptosis and brisk mitosis.  The  appearance is diffuse with lack of atypical follicles.  A large battery  of  immunohistochemical stains was performed and shows that the atypical  lymphoid cells are positive for LCA, CD20, PAX 5, BCL-2, BCL6, CD5,  MUM-1 (partial).  The atypical lymphoid cells are negative for CD10,  CD30, CD34, CD138, cyclin D1, TdT, and EBV in situ hybridization,  synaptophysin, cytokeratin AE1/AE3, cytokeratin 20, cytokeratin 7,  cytokeratin 5/6, TTF-1, CD56, CDX2, and p40.  There is an admixed  relatively minor population of T-cells as primarily seen with CD3.  The  overall features are consistent with large B-cell lymphoma, ABC subtype.  The lymphomatous process displays expression of CD5.  This phenotype is  seen in 5-10% of mostly de novo large B-cell lymphoma cases or rarely as  a transformation of a low-grade B-cell lymphoma such as small  lymphocytic lymphoma.  Clinical correlation is recommended.    04/25/2020 Imaging   CT AP  IMPRESSION: 1. Development of high-density ascites in the abdomen and pelvis. Findings are most compatible with a small to moderate amount of hemoperitoneum and related to the recent liver biopsy. 2. Extensive lymphadenopathy throughout the abdomen and pelvis with a large mass or nodal mass near the pancreatic head. There are additional lesions involving the liver and spleen. Findings are suggestive for metastatic disease. 3. Development of small bilateral pleural effusions.   These results were called by telephone at the time of interpretation on 04/25/2020 at 1:43 pm to provider ERIC [place] who verbally acknowledged these results.   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 Completed      07/04/2020 PET  scan   IMPRESSION: 1. Marked reduction in volume of anterior mediastinal mass now with low metabolic activity ( [place] 2). 2. Reduction in  volume of retroperitoneal nodal mass adjacent the pancreas with low metabolic activity ( [place] 3). 3. No increased metabolic activity associated with the spleen 4. Interval contraction of hepatic hematoma post biopsy. No hypermetabolic elements within the hepatic lesion. 5. Grade 2 anterolisthesis L4 on L5.     10/03/2020 Imaging   PET IMPRESSION: 1. No evidence of residual or recurrent lymphoma. 2. Heterogeneous bilobed mass in the liver, unchanged and reportedly due to chronic post biopsy hematomas. 3. Enlarged prostate.  Associated bladder wall thickening. 4. Aortic atherosclerosis (ICD10-I70.0). Coronary artery calcification.        CURRENT THERAPY:  Surveillance  INTERVAL HISTORY:  John [name] is here for a follow up of lymphoma. He was last seen by me on 10/19/20. He presents to the clinic accompanied by his wife. He reports tenderness and redness to his port site. He notes this has been going on for about a week or two. He reports his energy is slowly improving. He notes he and his wife are walking a few miles a couple days a week.   All other systems were reviewed with the patient and are negative.  MEDICAL HISTORY:  Past Medical History:  Diagnosis Date   Cancer [facility])    Goals of care, counseling/discussion 05/06/2020   Hypertension    Knee pain, right     SURGICAL HISTORY: Past Surgical History:  Procedure Laterality Date   FOOT SURGERY     IR IMAGING GUIDED PORT INSERTION  05/28/2020   TONSILLECTOMY      I have reviewed the social history and family history with the patient and they are unchanged from previous note.  ALLERGIES:  has No Known Allergies.  MEDICATIONS:  Current Outpatient Medications  Medication Sig Dispense Refill   allopurinol (ZYLOPRIM) 100 MG tablet TAKE 1 TABLET(100 MG) BY MOUTH DAILY (Patient taking differently: Take 100 mg by mouth daily.) 90 tablet 1   Ascorbic Acid (VITAMIN C WITH ROSE HIPS) 1000 MG tablet Take 1,000 mg  by mouth daily.     doxazosin (CARDURA) 4 MG tablet Take 4 mg by mouth daily.     feeding supplement (BOOST HIGH PROTEIN) LIQD Take 237 mLs by mouth every other day.     lidocaine-prilocaine (EMLA) cream Apply 1 application topically as needed. (Patient taking differently: Apply 1 application topically daily as needed (pain).) 30 g 0   magic mouthwash w/lidocaine SOLN Take 15 mLs by mouth 4 (four) times daily as needed for mouth pain. 400 mL 0   melatonin 3 MG TABS tablet Take 2 tablets (6 mg total) by mouth at bedtime as needed (sleep). (Patient taking differently: Take 6 mg by mouth at bedtime.) 30 tablet 0   metoprolol tartrate (LOPRESSOR) 25 MG tablet Take 0.5 tablets (12.5 mg total) by mouth 2 (two) times daily. (Patient taking differently: Take 12.5 mg by mouth in the morning.) 30 tablet 0   Multiple Vitamins-Minerals (MULTIVITAMIN PO) Take 1 tablet by mouth daily.     Nutritional Supplements (BOOST MAX PROTEIN PO) Take 237 mLs by mouth daily. Chocolate     Omega-3 Fatty Acids (OMEGA 3 PO) Take 1 tablet by mouth daily.     ondansetron (ZOFRAN) 8 MG tablet Take 1 tablet (8 mg total) by mouth every 8 (eight) hours as needed for nausea or vomiting. 20 tablet 0   pantoprazole (PROTONIX) 40  MG tablet Take 1 tablet (40 mg total) by mouth daily. 30 tablet 3   polyethylene glycol (MIRALAX / GLYCOLAX) 17 g packet Take 17 g by mouth daily as needed for mild constipation.     prochlorperazine (COMPAZINE) 10 MG tablet Take 1 tablet (10 mg total) by mouth every 6 (six) hours as needed for nausea or vomiting. 30 tablet 0   senna-docusate (SENOKOT-S) 8.6-50 MG tablet Take 2 tablets by mouth 2 (two) times daily as needed for mild constipation. (Patient taking differently: Take 2 tablets by mouth 2 (two) times daily.)     simethicone (MYLICON) 80 MG chewable tablet Chew 1 tablet (80 mg total) by mouth 4 (four) times daily as needed for flatulence. (Patient not taking: No sig reported) 30 tablet 0   Sodium  Chloride-Sodium Bicarb (SODIUM BICARBONATE/SODIUM CHLORIDE) SOLN 1 application by Mouth Rinse route as needed for dry mouth or mouth pain.     No current facility-administered medications for this visit.   Facility-Administered Medications Ordered in Other Visits  Medication Dose Route Frequency Provider Last Rate Last Admin   heparin lock flush 100 unit/mL  500 Units Intracatheter PRN Truitt Merle, MD       sodium chloride flush (NS) 0.9 % injection 10 mL  10 mL Intracatheter PRN Truitt Merle, MD        PHYSICAL EXAMINATION: ECOG PERFORMANCE STATUS: 0 - Asymptomatic  Vitals:   01/09/21 0841  BP: 132/62  Pulse: 66  Resp: 19  Temp: 97.8 F (36.6 C)  SpO2: 100%   Wt Readings from Last 3 Encounters:  01/09/21 218 lb 11.2 oz (99.2 kg)  10/19/20 222 lb 3.2 oz (100.8 kg)  09/07/20 212 lb 14.4 oz (96.6 kg)    GENERAL:alert, no distress and comfortable SKIN: skin color, texture, turgor are normal, no rashes or significant lesions, there is a small area of black and erythema over his port, no blister or ulcers  EYES: normal, Conjunctiva are pink and non-injected, sclera clear  NECK: supple, thyroid normal size, non-tender, without nodularity LYMPH:  no palpable lymphadenopathy in the cervical, axillary  LUNGS: clear to auscultation and percussion with normal breathing effort HEART: regular rate & rhythm and no murmurs and no lower extremity edema ABDOMEN:abdomen soft, non-tender and normal bowel sounds Musculoskeletal:no cyanosis of digits and no clubbing  NEURO: alert & oriented x 3 with fluent speech, no focal motor/sensory deficits  LABORATORY DATA:  I have reviewed the data as listed CBC Latest Ref Rng & Units 01/09/2021 11/29/2020 10/17/2020  WBC 4.0 - 10.5 K/uL 4.4 4.5 3.2(L)  Hemoglobin 13.0 - 17.0 g/dL 12.9(L) 12.0(L) 11.1(L)  Hematocrit 39.0 - 52.0 % 37.6(L) 35.0(L) 32.6(L)  Platelets 150 - 400 K/uL 68(L) 64(L) 56(L)     CMP Latest Ref Rng & Units 01/09/2021 11/29/2020 10/17/2020   Glucose 70 - 99 mg/dL 103(H) 115(H) 120(H)  BUN 8 - 23 mg/dL '23 20 19  '$ Creatinine 0.61 - 1.24 mg/dL 0.95 0.77 0.78  Sodium 135 - 145 mmol/L 141 141 142  Potassium 3.5 - 5.1 mmol/L 4.4 3.9 3.9  Chloride 98 - 111 mmol/L 105 107 106  CO2 22 - 32 mmol/L '26 25 26  '$ Calcium 8.9 - 10.3 mg/dL 9.7 9.0 9.5  Total Protein 6.5 - 8.1 g/dL 6.5 6.1(L) 6.0(L)  Total Bilirubin 0.3 - 1.2 mg/dL 0.7 0.6 0.6  Alkaline Phos 38 - 126 U/L 61 57 61  AST 15 - 41 U/L '20 22 22  '$ ALT 0 - 44 U/L 21 17  16      RADIOGRAPHIC STUDIES: I have personally reviewed the radiological images as listed and agreed with the findings in the report. No results found.   ASSESSMENT & PLAN:  John [name] is a 75 y.o. male with   1. Diffuse large B cell lymphoma, ABC Subtype, stage IV -After worsening fatigue pt presented to ED. 04/20/20 CT CAP showed large mediastinal mass and diffuse liver masses, and diffuse retroperitoneal lymphadenopathy. 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 Given his liver metastasis and LN involvement, this is stage IV. His lymphoma FISH panel is negative for double hit lymphoma -He completed first-line Inpatient R-EPOCH q3weeks for 6 cycles on 05/01/20-08/27/20. Given his high risk for CNS involvement, treatment involved intrathecal treatment with methotrexate prophylactically from C2. He is currently on surveillance/observation. -His 10/03/20 PET showed No evidence of residual or recurrent lymphoma and  Heterogeneous bilobed mass in the liver, unchanged and is likely post biopsy hematomas.  -He continues to recover from treatment. His energy continued to improve. Labs reviewed, and Hgb is recovering well.  Physical exam was unremarkable. -Continue surveillace. He will be due for survilliacne CT scan in late 03/2021. -Given his current redness and tenderness (likely small bleeding), he did not have flush today. He will return in 2 weeks  to see if this has improved and proceed with flush at that time. -F/u in 3 months.    2. Thrombocytopenia, secondary to chemo -He was given GCSF after Cycle 1 due to significant pancytopenia with neutropenic fever. He was treated as inpatient and required blood and platelet transfusion.  -He last required blood transfusion on 06/14/20 and 09/04/20.  -He received Evusheld vaccine on 08/16/20 and 08/17/20. He has received his second COVID Booster. -S/p treatment, his labs improved except platelets remain low at 68K today (01/09/21)       PLAN:  -port flush in 2 and 8 weeks -labs in 8 weeks and 3 months -f/u in 3 months with surveillance CT CAP w cotnrast    No problem-specific Assessment & Plan notes found for this encounter.   Orders Placed This Encounter  Procedures   CT CHEST ABDOMEN PELVIS W CONTRAST    Standing Status:   Future    Standing Expiration Date:   01/09/2022    Order Specific Question:   If indicated for the ordered procedure, I authorize the administration of contrast media per Radiology protocol    Answer:   Yes    Order Specific Question:   Preferred imaging location?    Answer:   [facility]    Order Specific Question:   Release to patient    Answer:   Immediate    Order Specific Question:   Is Oral Contrast requested for this exam?    Answer:   Yes, Per Radiology protocol    Order Specific Question:   Reason for Exam (SYMPTOM  OR DIAGNOSIS REQUIRED)    Answer:   rule out recurrence    All questions were answered. The patient knows to call the clinic with any problems, questions or concerns. No barriers to learning was detected. The total time spent in the appointment was 30 minutes.     Truitt Merle, MD 01/09/2021   I, Wilburn Mylar, am acting as scribe for Truitt Merle, MD.   I have reviewed the above documentation for accuracy and completeness, and I agree with the above.

## 2021-01-09 NOTE — Progress Notes (Unsigned)
Cassie Hellingoepter PA assessed pt's port. The port looks visible under a thin layer of skin therefore she recommends pt get labs done peripherally and for pt to go to appt with Dr. Burr Medico to be assessed there as well.

## 2021-01-10 ENCOUNTER — Other Ambulatory Visit: Payer: Medicare HMO

## 2021-01-10 ENCOUNTER — Ambulatory Visit: Payer: Medicare HMO | Admitting: Hematology

## 2021-01-10 NOTE — Addendum Note (Signed)
Addended by: Estella Husk on: 01/10/2021 01:13 PM   Modules accepted: Orders

## 2021-01-23 ENCOUNTER — Other Ambulatory Visit: Payer: Self-pay

## 2021-01-23 ENCOUNTER — Inpatient Hospital Stay: Payer: Medicare HMO

## 2021-01-23 DIAGNOSIS — C8339 Diffuse large B-cell lymphoma, extranodal and solid organ sites: Secondary | ICD-10-CM | POA: Diagnosis not present

## 2021-01-23 DIAGNOSIS — Z95828 Presence of other vascular implants and grafts: Secondary | ICD-10-CM

## 2021-01-23 MED ORDER — SODIUM CHLORIDE 0.9% FLUSH
10.0000 mL | INTRAVENOUS | Status: DC | PRN
  Administered 2021-01-23: 10 mL via INTRAVENOUS

## 2021-01-23 MED ORDER — HEPARIN SOD (PORK) LOCK FLUSH 100 UNIT/ML IV SOLN
500.0000 [IU] | Freq: Once | INTRAVENOUS | Status: AC
  Administered 2021-01-23: 500 [IU] via INTRAVENOUS

## 2021-01-23 NOTE — Progress Notes (Signed)
Pt here for port flush. The port looks the same as the last time the pt was here, with the bottom part of the port being visible under a thin layer of skin. Dr. Burr Medico was informed and she came to assess the port. She gave the OK for me to access and flush the port. The pt didn't complain of any pain or tenderness from being flushed. Pt advised to keep neosporin and band-aid on port, and watch for any signs of infection.

## 2021-02-02 ENCOUNTER — Encounter: Payer: Self-pay | Admitting: Hematology

## 2021-02-04 ENCOUNTER — Other Ambulatory Visit: Payer: Self-pay

## 2021-02-04 ENCOUNTER — Other Ambulatory Visit: Payer: Self-pay | Admitting: Hematology

## 2021-02-04 DIAGNOSIS — C8338 Diffuse large B-cell lymphoma, lymph nodes of multiple sites: Secondary | ICD-10-CM

## 2021-02-04 MED ORDER — AMOXICILLIN-POT CLAVULANATE 875-125 MG PO TABS: 1.0000 | ORAL_TABLET | Freq: Two times a day (BID) | ORAL | 0 refills | Status: AC

## 2021-02-04 NOTE — Progress Notes (Signed)
in

## 2021-02-04 NOTE — Progress Notes (Signed)
Port removal scheduled with Tiffany in IR for 02/07/21 at 1230. Pt is aware and verbalized thanks.

## 2021-02-04 NOTE — Progress Notes (Signed)
Port removal per MD.

## 2021-02-06 ENCOUNTER — Other Ambulatory Visit: Payer: Self-pay | Admitting: Student

## 2021-02-07 ENCOUNTER — Other Ambulatory Visit: Payer: Self-pay

## 2021-02-07 ENCOUNTER — Ambulatory Visit (HOSPITAL_COMMUNITY)
Admission: RE | Admit: 2021-02-07 | Discharge: 2021-02-07 | Disposition: A | Discharge status: Home / Self Care | Payer: Medicare HMO | Source: Ambulatory Visit | Attending: Hematology | Admitting: Hematology

## 2021-02-07 ENCOUNTER — Encounter (HOSPITAL_COMMUNITY): Payer: Self-pay

## 2021-02-07 DIAGNOSIS — Z79899 Other long term (current) drug therapy: Secondary | ICD-10-CM | POA: Diagnosis not present

## 2021-02-07 DIAGNOSIS — C8338 Diffuse large B-cell lymphoma, lymph nodes of multiple sites: Secondary | ICD-10-CM | POA: Diagnosis not present

## 2021-02-07 DIAGNOSIS — Z452 Encounter for adjustment and management of vascular access device: Secondary | ICD-10-CM | POA: Diagnosis not present

## 2021-02-07 HISTORY — PX: IR REMOVAL TUN ACCESS W/ PORT W/O FL MOD SED: IMG2290

## 2021-02-07 MED ORDER — FENTANYL CITRATE (PF) 100 MCG/2ML IJ SOLN
INTRAMUSCULAR | Status: AC
  Filled 2021-02-07: qty 2

## 2021-02-07 MED ORDER — FENTANYL CITRATE (PF) 100 MCG/2ML IJ SOLN
INTRAMUSCULAR | Status: AC | PRN
  Administered 2021-02-07 (×2): 50 ug via INTRAVENOUS

## 2021-02-07 MED ORDER — MIDAZOLAM HCL 2 MG/2ML IJ SOLN
INTRAMUSCULAR | Status: AC
  Filled 2021-02-07: qty 4

## 2021-02-07 MED ORDER — MIDAZOLAM HCL 2 MG/2ML IJ SOLN
INTRAMUSCULAR | Status: AC | PRN
  Administered 2021-02-07 (×2): 1 mg via INTRAVENOUS

## 2021-02-07 MED ORDER — LIDOCAINE HCL 1 % IJ SOLN
INTRAMUSCULAR | Status: AC
  Filled 2021-02-07: qty 20

## 2021-02-07 MED ORDER — SODIUM CHLORIDE 0.9 % IV SOLN: INTRAVENOUS | Status: DC

## 2021-02-07 MED ORDER — LIDOCAINE HCL 1 % IJ SOLN
INTRAMUSCULAR | Status: AC | PRN
  Administered 2021-02-07: 10 mL via INTRADERMAL

## 2021-02-07 NOTE — H&P (Signed)
Chief Complaint: Patient was seen in consultation today for port removal at the request of Feng,Yan  Referring Physician(s): Feng,Yan  Supervising Physician: Michaelle Birks  Patient Status: [name]  History of Present Illness: John [name] is a 75 y.o. male with hx of lymphoma. He had a port placed 05/28/2020 and has completed treatment. He has kept his port but has only been using it for flushes about every 6 weeks, Recently noting some redness over the port site concerning for possible infection. He was placed on po antibiotics and referred for port removal since he's not really using it anyway.  PMHx, meds, labs, imaging, allergies reviewed. Feels well, no recent fevers, chills, illness. Has been NPO today as directed.    Past Medical History:  Diagnosis Date   Cancer [facility])    Goals of care, counseling/discussion 05/06/2020   Hypertension    Knee pain, right     Past Surgical History:  Procedure Laterality Date   FOOT SURGERY     IR IMAGING GUIDED PORT INSERTION  05/28/2020   TONSILLECTOMY      Allergies: Patient has no known allergies.  Medications: Prior to Admission medications   Medication Sig Start Date End Date Taking? Authorizing Provider  amoxicillin-clavulanate (AUGMENTIN) 875-125 MG tablet Take 1 tablet by mouth 2 (two) times daily for 7 days. 02/04/21 02/11/21 Yes Truitt Merle, MD  Ascorbic Acid (VITAMIN C WITH ROSE HIPS) 1000 MG tablet Take 1,000 mg by mouth daily.   Yes [provider]  doxazosin (CARDURA) 4 MG tablet Take 4 mg by mouth daily.   Yes [provider]  metoprolol tartrate (LOPRESSOR) 25 MG tablet Take 0.5 tablets (12.5 mg total) by mouth 2 (two) times daily. Patient taking differently: Take 12.5 mg by mouth in the morning. 06/09/18  Yes Lajean Saver, MD  Multiple Vitamins-Minerals (MULTIVITAMIN PO) Take 1 tablet by mouth daily.   Yes [provider]  Omega-3 Fatty Acids (OMEGA 3 PO) Take 1 tablet by mouth  daily.   Yes [provider]  lidocaine-prilocaine (EMLA) cream Apply 1 application topically as needed. Patient taking differently: Apply 1 application topically daily as needed (pain). 06/15/20   Truitt Merle, MD     Family History  Problem Relation Age of Onset   Diabetes Mellitus II Brother    Skin cancer Mother    Cancer Mother        skin cancer    Social History   Socioeconomic History   Marital status: Married    Spouse name: Not on file   Number of children: 0   Years of education: Not on file   Highest education level: Not on file  Occupational History   Occupation: retired Customer service manager   Tobacco Use   Smoking status: Never   Smokeless tobacco: Never  Vaping Use   Vaping Use: Never used  Substance and Sexual Activity   Alcohol use: No   Drug use: No   Sexual activity: Not on file  Other Topics Concern   Not on file  Social History Narrative   Not on file   Social Determinants of Health   Financial Resource Strain: Not on file  Food Insecurity: Not on file  Transportation Needs: Not on file  Physical Activity: Not on file  Stress: Not on file  Social Connections: Not on file     Review of Systems: A 12 point ROS discussed and pertinent positives are indicated in the HPI above.  All other systems  are negative.  Review of Systems  Vital Signs: BP 128/76   Pulse 72   Temp 98.1 F (36.7 C) (Oral)   Resp 19   SpO2 97%   Physical Exam Constitutional:      Appearance: Normal appearance. He is not ill-appearing.  HENT:     Mouth/Throat:     Mouth: Mucous membranes are moist.     Pharynx: Oropharynx is clear.  Cardiovascular:     Rate and Rhythm: Normal rate and regular rhythm.     Heart sounds: Normal heart sounds.  Pulmonary:     Effort: Pulmonary effort is normal. No respiratory distress.     Breath sounds: Normal breath sounds.  Skin:    General: Skin is warm and dry.     Comments: (R)upper chest port site with some overlying  erythema. Non tender, no drainage It really looked like the skin has become very thin over the post access site.  Neurological:     General: No focal deficit present.     Mental Status: He is alert and oriented to person, place, and time.  Psychiatric:        Mood and Affect: Mood normal.        Thought Content: Thought content normal.    Imaging: No results found.  Labs:  CBC: Recent Labs    09/07/20 1045 10/17/20 0823 11/29/20 0748 01/09/21 0827  WBC 5.7 3.2* 4.5 4.4  HGB 9.6* 11.1* 12.0* 12.9*  HCT 29.2* 32.6* 35.0* 37.6*  PLT 91* 56* 64* 68*    COAGS: Recent Labs    05/12/20 2044 05/13/20 0411 05/28/20 0800 07/09/20 0825  INR 1.6* 1.7* 1.2 1.2  APTT 34  --   --  35    BMP: Recent Labs    09/07/20 1045 10/17/20 0823 11/29/20 0748 01/09/21 0827  NA 141 142 141 141  K 3.6 3.9 3.9 4.4  CL 106 106 107 105  CO2 '24 26 25 26  '$ GLUCOSE 140* 120* 115* 103*  BUN '16 19 20 23  '$ CALCIUM 8.3* 9.5 9.0 9.7  CREATININE 0.79 0.78 0.77 0.95  GFRNONAA >60 >60 >60 >60    LIVER FUNCTION TESTS: Recent Labs    09/07/20 1045 10/17/20 0823 11/29/20 0748 01/09/21 0827  BILITOT 0.4 0.6 0.6 0.7  AST '18 22 22 20  '$ ALT '16 16 17 21  '$ ALKPHOS 75 61 57 61  PROT 5.8* 6.0* 6.1* 6.5  ALBUMIN 3.3* 3.6 3.5 3.9    TUMOR MARKERS: No results for input(s): AFPTM, CEA, CA199, CHROMGRNA in the last 8760 hours.  Assessment and Plan: Hx lymphoma Treatment complete Port at risk for infection and/or erosion through skin. Since port no longer needed, will plan for removal. Risks and benefits of image guided port-a-catheter removal was discussed with the patient including, but not limited to bleeding, infection, pneumothorax, or fibrin sheath development and need for additional procedures.  All of the patient's questions were answered, patient is agreeable to proceed. Consent signed and in chart.   Thank you for this interesting consult.  I greatly enjoyed meeting [name]  and look forward to participating in their care.  A copy of this report was sent to the requesting provider on this date.  Electronically Signed: Ascencion Dike, PA-C 02/07/2021, 1:01 PM   I spent a total of 20 minutes in face to face in clinical consultation, greater than 50% of which was counseling/coordinating care for port removal

## 2021-02-07 NOTE — Discharge Instructions (Addendum)
Interventional radiology phone numbers [phone] After hours [phone]  Place Bacitracin on port removal site and change dressing daily or as it becomes soiled. Please notify Dr. Burr Medico if you notice any signs or symptoms of infection such as redness, swelling, foul odor.  Also notify Dr. Burr Medico if site becomes painful or you develop a fever.  Implanted Port Removal, Care After This sheet gives you information about how to care for yourself after your procedure. Your health care provider may also give you more specific instructions. If you have problems or questions, contact your health care provider. What can I expect after the procedure? After the procedure, it is common to have: Soreness or pain near your incision. Some swelling or bruising near your incision. Follow these instructions at home: Medicines Take over-the-counter and prescription medicines only as told by your health care provider. If you were prescribed an antibiotic medicine, take it as told by your health care provider. Do not stop taking the antibiotic even if you start to feel better. Bathing Do not take baths, swim, or use a hot tub until your health care provider approves. You may shower tomorrow. Remove your dressing prior to your shower. Incision care Follow instructions from your health care provider about how to take care of your incision. Make sure you: Wash your hands with soap and water before you change your bandage (dressing). If soap and water are not available, use hand sanitizer. Keep your dressing dry. Leave  skin glue in place. These skin closures may need to stay in place for 2 weeks or longer. . Check your incision area every day for signs of infection. Check for: More redness, swelling, or pain. More fluid or blood. Warmth. Pus or a bad smell.   Driving Do not drive for 24 hours if you were given a medicine to help you relax (sedative) during your procedure. If you did not receive a sedative,  ask your health care provider when it is safe to drive.   Activity Return to your normal activities as told by your health care provider. Ask your health care provider what activities are safe for you. Do not lift anything that is heavier than 10 lb (4.5 kg), or the limit that you are told, until your health care provider says that it is safe. Do not do activities that involve lifting your arms over your head. General instructions Do not use any products that contain nicotine or tobacco, such as cigarettes and e-cigarettes. These can delay healing. If you need help quitting, ask your health care provider. Keep all follow-up visits as told by your health care provider. This is important. Contact a health care provider if: You have more redness, swelling, or pain around your incision. You have more fluid or blood coming from your incision. Your incision feels warm to the touch. You have pus or a bad smell coming from your incision. You have pain that is not relieved by your pain medicine. Get help right away if you have: A fever or chills. Chest pain. Difficulty breathing. Summary After the procedure, it is common to have pain, soreness, swelling, or bruising near your incision. If you were prescribed an antibiotic medicine, take it as told by your health care provider. Do not stop taking the antibiotic even if you start to feel better. Do not drive for 24 hours if you were given a sedative during your procedure. Return to your normal activities as told by your health care provider. Ask your health care provider  what activities are safe for you. This information is not intended to replace advice given to you by your health care provider. Make sure you discuss any questions you have with your health care provider. Document Revised: 07/09/2017 Document Reviewed: 07/09/2017 Elsevier Patient Education  2021 [employer].     Moderate Conscious Sedation, Adult, Care After This sheet gives  you information about how to care for yourself after your procedure. Your health care provider may also give you more specific instructions. If you have problems or questions, contact your health care provider. What can I expect after the procedure? After the procedure, it is common to have: Sleepiness for several hours. Impaired judgment for several hours. Difficulty with balance. Vomiting if you eat too soon. Follow these instructions at home: For the time period you were told by your health care provider: Rest. Do not participate in activities where you could fall or become injured. Do not drive or use machinery. Do not drink alcohol. Do not take sleeping pills or medicines that cause drowsiness. Do not make important decisions or sign legal documents. Do not take care of children on your own.      Eating and drinking Follow the diet recommended by your health care provider. Drink enough fluid to keep your urine pale yellow. If you vomit: Drink water, juice, or soup when you can drink without vomiting. Make sure you have little or no nausea before eating solid foods.   General instructions Take over-the-counter and prescription medicines only as told by your health care provider. Have a responsible adult stay with you for the time you are told. It is important to have someone help care for you until you are awake and alert. Do not smoke. Keep all follow-up visits as told by your health care provider. This is important. Contact a health care provider if: You are still sleepy or having trouble with balance after 24 hours. You feel light-headed. You keep feeling nauseous or you keep vomiting. You develop a rash. You have a fever. You have redness or swelling around the IV site. Get help right away if: You have trouble breathing. You have new-onset confusion at home. Summary After the procedure, it is common to feel sleepy, have impaired judgment, or feel nauseous if you eat  too soon. Rest after you get home. Know the things you should not do after the procedure. Follow the diet recommended by your health care provider and drink enough fluid to keep your urine pale yellow. Get help right away if you have trouble breathing or new-onset confusion at home. This information is not intended to replace advice given to you by your health care provider. Make sure you discuss any questions you have with your health care provider. Document Revised: 09/23/2019 Document Reviewed: 04/21/2019 Elsevier Patient Education  2021 [employer].

## 2021-02-07 NOTE — Procedures (Signed)
Vascular and Interventional Radiology Procedure Note  Patient: John [name] DOB: 1946-05-27 Medical Record Number: [id number] Note Date/Time: 02/07/21 3:50 PM   Performing Physician: Michaelle Birks, MD Assistant(s): None  Diagnosis:  Port erosion.  Procedure: PORT REMOVAL  Anesthesia: Conscious Sedation Complications: None Estimated Blood Loss: Minimal Specimens:  None  Findings:  Successful removal of a right-sided venous port. Primary incision closure. Dermabond at skin. Access site erosion pin hole. Pt to receive supplies for dressing changes and wound care until granulation.   See detailed procedure note with images in PACS. The patient tolerated the procedure well without incident or complication and was returned to Recovery in stable condition.    Michaelle Birks, MD [facility] [place] [facility]   Pager. [place]

## 2021-03-06 ENCOUNTER — Other Ambulatory Visit: Payer: Self-pay

## 2021-03-06 ENCOUNTER — Inpatient Hospital Stay: Payer: Medicare HMO | Attending: Hematology

## 2021-03-06 ENCOUNTER — Telehealth: Payer: Self-pay

## 2021-03-06 ENCOUNTER — Other Ambulatory Visit: Payer: Medicare HMO

## 2021-03-06 DIAGNOSIS — C8339 Diffuse large B-cell lymphoma, extranodal and solid organ sites: Secondary | ICD-10-CM | POA: Insufficient documentation

## 2021-03-06 DIAGNOSIS — D649 Anemia, unspecified: Secondary | ICD-10-CM | POA: Diagnosis not present

## 2021-03-06 DIAGNOSIS — C787 Secondary malignant neoplasm of liver and intrahepatic bile duct: Secondary | ICD-10-CM | POA: Insufficient documentation

## 2021-03-06 DIAGNOSIS — Z452 Encounter for adjustment and management of vascular access device: Secondary | ICD-10-CM | POA: Insufficient documentation

## 2021-03-06 DIAGNOSIS — D61810 Antineoplastic chemotherapy induced pancytopenia: Secondary | ICD-10-CM | POA: Diagnosis not present

## 2021-03-06 DIAGNOSIS — Z9221 Personal history of antineoplastic chemotherapy: Secondary | ICD-10-CM | POA: Diagnosis not present

## 2021-03-06 LAB — LACTATE DEHYDROGENASE: LDH: 196 U/L — ABNORMAL HIGH (ref 98–192)

## 2021-03-06 LAB — CBC WITH DIFFERENTIAL (CANCER CENTER ONLY)
Abs Immature Granulocytes: 0.02 10*3/uL (ref 0.00–0.07)
Basophils Absolute: 0 10*3/uL (ref 0.0–0.1)
Basophils Relative: 1 %
Eosinophils Absolute: 0.1 10*3/uL (ref 0.0–0.5)
Eosinophils Relative: 3 %
HCT: 38.7 % — ABNORMAL LOW (ref 39.0–52.0)
Hemoglobin: 13 g/dL (ref 13.0–17.0)
Immature Granulocytes: 0 %
Lymphocytes Relative: 9 %
Lymphs Abs: 0.4 10*3/uL — ABNORMAL LOW (ref 0.7–4.0)
MCH: 29.5 pg (ref 26.0–34.0)
MCHC: 33.6 g/dL (ref 30.0–36.0)
MCV: 88 fL (ref 80.0–100.0)
Monocytes Absolute: 0.6 10*3/uL (ref 0.1–1.0)
Monocytes Relative: 14 %
Neutro Abs: 3.3 10*3/uL (ref 1.7–7.7)
Neutrophils Relative %: 73 %
Platelet Count: 82 10*3/uL — ABNORMAL LOW (ref 150–400)
RBC: 4.4 MIL/uL (ref 4.22–5.81)
RDW: 14.2 % (ref 11.5–15.5)
WBC Count: 4.5 10*3/uL (ref 4.0–10.5)
nRBC: 0 % (ref 0.0–0.2)

## 2021-03-06 LAB — CMP (CANCER CENTER ONLY)
ALT: 19 U/L (ref 0–44)
AST: 21 U/L (ref 15–41)
Albumin: 4.1 g/dL (ref 3.5–5.0)
Alkaline Phosphatase: 71 U/L (ref 38–126)
Anion gap: 11 (ref 5–15)
BUN: 18 mg/dL (ref 8–23)
CO2: 25 mmol/L (ref 22–32)
Calcium: 9.6 mg/dL (ref 8.9–10.3)
Chloride: 105 mmol/L (ref 98–111)
Creatinine: 0.91 mg/dL (ref 0.61–1.24)
GFR, Estimated: >60 mL/min (ref >60)
Glucose, Bld: 99 mg/dL (ref 70–99)
Potassium: 3.8 mmol/L (ref 3.5–5.1)
Sodium: 141 mmol/L (ref 135–145)
Total Bilirubin: 0.9 mg/dL (ref 0.3–1.2)
Total Protein: 6.6 g/dL (ref 6.5–8.1)

## 2021-03-06 LAB — URIC ACID: Uric Acid, Serum: 8.3 mg/dL (ref 3.7–8.6)

## 2021-03-06 NOTE — Telephone Encounter (Signed)
Called and spoke with patient. Informed him of CT scan scheduled for 11/1 at 830am. Advised him of NPO and contrast instructions. Patient verbalized understanding.

## 2021-03-21 ENCOUNTER — Inpatient Hospital Stay: Payer: Medicare HMO | Attending: Hematology

## 2021-04-09 ENCOUNTER — Other Ambulatory Visit: Payer: Self-pay

## 2021-04-09 ENCOUNTER — Other Ambulatory Visit: Payer: Medicare HMO

## 2021-04-09 ENCOUNTER — Inpatient Hospital Stay: Payer: Medicare HMO | Attending: Hematology

## 2021-04-09 ENCOUNTER — Encounter (HOSPITAL_COMMUNITY): Payer: Self-pay

## 2021-04-09 ENCOUNTER — Ambulatory Visit (HOSPITAL_COMMUNITY)
Admission: RE | Admit: 2021-04-09 | Discharge: 2021-04-09 | Disposition: A | Discharge status: Home / Self Care | Payer: Medicare HMO | Source: Ambulatory Visit | Attending: Hematology | Admitting: Hematology

## 2021-04-09 DIAGNOSIS — C8338 Diffuse large B-cell lymphoma, lymph nodes of multiple sites: Secondary | ICD-10-CM | POA: Insufficient documentation

## 2021-04-09 DIAGNOSIS — T451X5A Adverse effect of antineoplastic and immunosuppressive drugs, initial encounter: Secondary | ICD-10-CM | POA: Insufficient documentation

## 2021-04-09 DIAGNOSIS — D6959 Other secondary thrombocytopenia: Secondary | ICD-10-CM | POA: Insufficient documentation

## 2021-04-09 DIAGNOSIS — Z9221 Personal history of antineoplastic chemotherapy: Secondary | ICD-10-CM | POA: Insufficient documentation

## 2021-04-09 DIAGNOSIS — Z298 Encounter for other specified prophylactic measures: Secondary | ICD-10-CM | POA: Insufficient documentation

## 2021-04-09 DIAGNOSIS — C8339 Diffuse large B-cell lymphoma, extranodal and solid organ sites: Secondary | ICD-10-CM | POA: Insufficient documentation

## 2021-04-09 LAB — CMP (CANCER CENTER ONLY)
ALT: 20 U/L (ref 0–44)
AST: 21 U/L (ref 15–41)
Albumin: 4.1 g/dL (ref 3.5–5.0)
Alkaline Phosphatase: 62 U/L (ref 38–126)
Anion gap: 10 (ref 5–15)
BUN: 18 mg/dL (ref 8–23)
CO2: 25 mmol/L (ref 22–32)
Calcium: 9.3 mg/dL (ref 8.9–10.3)
Chloride: 106 mmol/L (ref 98–111)
Creatinine: 0.84 mg/dL (ref 0.61–1.24)
GFR, Estimated: >60 mL/min (ref >60)
Glucose, Bld: 109 mg/dL — ABNORMAL HIGH (ref 70–99)
Potassium: 4.2 mmol/L (ref 3.5–5.1)
Sodium: 141 mmol/L (ref 135–145)
Total Bilirubin: 0.7 mg/dL (ref 0.3–1.2)
Total Protein: 6.6 g/dL (ref 6.5–8.1)

## 2021-04-09 LAB — CBC WITH DIFFERENTIAL (CANCER CENTER ONLY)
Abs Immature Granulocytes: 0.06 10*3/uL (ref 0.00–0.07)
Basophils Absolute: 0 10*3/uL (ref 0.0–0.1)
Basophils Relative: 1 %
Eosinophils Absolute: 0.2 10*3/uL (ref 0.0–0.5)
Eosinophils Relative: 4 %
HCT: 38.8 % — ABNORMAL LOW (ref 39.0–52.0)
Hemoglobin: 13.4 g/dL (ref 13.0–17.0)
Immature Granulocytes: 1 %
Lymphocytes Relative: 9 %
Lymphs Abs: 0.4 10*3/uL — ABNORMAL LOW (ref 0.7–4.0)
MCH: 29.6 pg (ref 26.0–34.0)
MCHC: 34.5 g/dL (ref 30.0–36.0)
MCV: 85.7 fL (ref 80.0–100.0)
Monocytes Absolute: 0.6 10*3/uL (ref 0.1–1.0)
Monocytes Relative: 13 %
Neutro Abs: 3.4 10*3/uL (ref 1.7–7.7)
Neutrophils Relative %: 72 %
Platelet Count: 83 10*3/uL — ABNORMAL LOW (ref 150–400)
RBC: 4.53 MIL/uL (ref 4.22–5.81)
RDW: 14 % (ref 11.5–15.5)
WBC Count: 4.7 10*3/uL (ref 4.0–10.5)
nRBC: 0 % (ref 0.0–0.2)

## 2021-04-09 LAB — LACTATE DEHYDROGENASE: LDH: 233 U/L — ABNORMAL HIGH (ref 98–192)

## 2021-04-09 LAB — URIC ACID: Uric Acid, Serum: 7.9 mg/dL (ref 3.7–8.6)

## 2021-04-09 MED ORDER — IOHEXOL 350 MG/ML SOLN
80.0000 mL | Freq: Once | INTRAVENOUS | Status: AC | PRN
  Administered 2021-04-09: 80 mL via INTRAVENOUS

## 2021-04-11 ENCOUNTER — Inpatient Hospital Stay (HOSPITAL_BASED_OUTPATIENT_CLINIC_OR_DEPARTMENT_OTHER): Payer: Medicare HMO | Admitting: Hematology

## 2021-04-11 ENCOUNTER — Other Ambulatory Visit: Payer: Self-pay

## 2021-04-11 VITALS — BP 127/68 | HR 78 | Temp 98.0°F | Resp 17 | Wt 223.9 lb

## 2021-04-11 DIAGNOSIS — C8338 Diffuse large B-cell lymphoma, lymph nodes of multiple sites: Secondary | ICD-10-CM

## 2021-04-11 DIAGNOSIS — T451X5A Adverse effect of antineoplastic and immunosuppressive drugs, initial encounter: Secondary | ICD-10-CM | POA: Diagnosis not present

## 2021-04-11 DIAGNOSIS — D6959 Other secondary thrombocytopenia: Secondary | ICD-10-CM | POA: Diagnosis not present

## 2021-04-11 DIAGNOSIS — Z9221 Personal history of antineoplastic chemotherapy: Secondary | ICD-10-CM | POA: Diagnosis not present

## 2021-04-11 DIAGNOSIS — C8339 Diffuse large B-cell lymphoma, extranodal and solid organ sites: Secondary | ICD-10-CM | POA: Diagnosis present

## 2021-04-11 DIAGNOSIS — Z298 Encounter for other specified prophylactic measures: Secondary | ICD-10-CM | POA: Diagnosis not present

## 2021-04-11 NOTE — Progress Notes (Signed)
[place]   Telephone:([phone]) [phone] Fax:(336) [phone]   Clinic Follow up Note   Patient Care Team: Christain [place], MD as PCP - General (Family Medicine)  Date of Service:  04/11/2021  CHIEF COMPLAINT: f/u of diffuse large B cell lymphoma  CURRENT THERAPY:  Surveillance  ASSESSMENT & PLAN:  John [name] is a 75 y.o. male with   1. Diffuse large B cell lymphoma, ABC Subtype, stage IV -After worsening fatigue pt presented to ED. 04/20/20 CT CAP showed large mediastinal mass and diffuse liver masses, and diffuse retroperitoneal lymphadenopathy. 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 Given his liver metastasis and LN involvement, this is stage IV. His lymphoma FISH panel is negative for double hit lymphoma -He completed first-line Inpatient R-EPOCH q3weeks for 6 cycles on 05/01/20-08/27/20. Given his high risk for CNS involvement, treatment involved intrathecal treatment with methotrexate prophylactically from C2. He is currently on surveillance/observation. -most recent CT CAP 04/09/21 showed decreased size of splenic lesions and liver lesion which were negative on last PET in 09/2020. No evidence of residual disease or recurrence. I reviewed with him  -He continues to recover from treatment. His energy continued to improve. Labs reviewed, and Hgb is recovering well.  Physical exam was unremarkable. -he had his port removed 02/07/21 -F/u in 3 months.    2. Thrombocytopenia, secondary to chemo -He was given GCSF after Cycle 1 due to significant pancytopenia with neutropenic fever. He was treated as inpatient and required blood and platelet transfusion.  -He last required blood transfusion on 06/14/20 and 09/04/20.  -He received Evusheld vaccine on 08/16/20 and 08/17/20. He has received his second COVID Booster. -S/p treatment, his labs improved overall. His plt have improved but remain low at 83k today  (04/11/21)      PLAN:  -lab and f/u in 3 months   No problem-specific Assessment & Plan notes found for this encounter.   SUMMARY OF ONCOLOGIC HISTORY: Oncology History Overview Note  Cancer Staging Diffuse large B cell lymphoma ([place]) Staging form: Hodgkin and Non-Hodgkin Lymphoma, AJCC 8th Edition - Clinical stage from 04/23/2020: Stage IV (Diffuse large B-cell lymphoma) - Signed by Truitt Merle, MD on 04/29/2020 Stage prefix: Initial diagnosis    Diffuse large B cell lymphoma ([place])  04/20/2020 Imaging   CT CAP  IMPRESSION: 1. Large anterior mediastinal/prevascular space mass/adenopathy. The mass extends superiorly and abuts the upper trachea. 2. Large hypodense lesion in the right lobe of the liver as well as ill-defined splenic hypodense lesions. Further characterization with MRI without and with contrast recommended. The liver lesion is amenable to percutaneous tissue sampling. 3. Extensive mesenteric and retroperitoneal adenopathy. 4. Sigmoid diverticulosis. No bowel obstruction. Normal appendix. 5. Aortic Atherosclerosis (ICD10-I70.0).   04/23/2020 Cancer Staging   Staging form: Hodgkin and Non-Hodgkin Lymphoma, AJCC 8th Edition - Clinical stage from 04/23/2020: Stage IV (Diffuse large B-cell lymphoma) - Signed by Truitt Merle, MD on 04/29/2020    04/23/2020 Initial Biopsy   FINAL MICROSCOPIC DIAGNOSIS:   A. LIVER, RIGHT MASS, NEEDLE CORE BIOPSY:  - Large B-cell lymphoma  - See comment     COMMENT:   The sections show needle core biopsy fragments displaying effacement of  the architecture by a dense infiltrate of primarily large atypical  lymphoid appearing cells displaying vesicular chromatin and small  nucleoli.  This is associated with apoptosis and brisk mitosis.  The  appearance is diffuse with lack of atypical follicles.  A large battery  of immunohistochemical stains was performed and shows that the atypical  lymphoid cells are positive for LCA, CD20, PAX  5, BCL-2, BCL6, CD5,  MUM-1 (partial).  The atypical lymphoid cells are negative for CD10,  CD30, CD34, CD138, cyclin D1, TdT, and EBV in situ hybridization,  synaptophysin, cytokeratin AE1/AE3, cytokeratin 20, cytokeratin 7,  cytokeratin 5/6, TTF-1, CD56, CDX2, and p40.  There is an admixed  relatively minor population of T-cells as primarily seen with CD3.  The  overall features are consistent with large B-cell lymphoma, ABC subtype.  The lymphomatous process displays expression of CD5.  This phenotype is  seen in 5-10% of mostly de novo large B-cell lymphoma cases or rarely as  a transformation of a low-grade B-cell lymphoma such as small  lymphocytic lymphoma.  Clinical correlation is recommended.    04/25/2020 Imaging   CT AP  IMPRESSION: 1. Development of high-density ascites in the abdomen and pelvis. Findings are most compatible with a small to moderate amount of hemoperitoneum and related to the recent liver biopsy. 2. Extensive lymphadenopathy throughout the abdomen and pelvis with a large mass or nodal mass near the pancreatic head. There are additional lesions involving the liver and spleen. Findings are suggestive for metastatic disease. 3. Development of small bilateral pleural effusions.   These results were called by telephone at the time of interpretation on 04/25/2020 at 1:43 pm to provider ERIC [place] who verbally acknowledged these results.   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 Completed      07/04/2020 PET scan   IMPRESSION: 1. Marked reduction in volume of anterior mediastinal mass now with low metabolic activity ( [place] 2). 2. Reduction in volume of retroperitoneal nodal mass adjacent the pancreas with low metabolic activity ( [place] 3). 3. No increased  metabolic activity associated with the spleen 4. Interval contraction of hepatic hematoma post biopsy. No hypermetabolic elements within the hepatic lesion. 5. Grade 2 anterolisthesis L4 on L5.     10/03/2020 Imaging   PET IMPRESSION: 1. No evidence of residual or recurrent lymphoma. 2. Heterogeneous bilobed mass in the liver, unchanged and reportedly due to chronic post biopsy hematomas. 3. Enlarged prostate.  Associated bladder wall thickening. 4. Aortic atherosclerosis (ICD10-I70.0). Coronary artery calcification.     04/09/2021 Imaging   CT CAP  IMPRESSION: 1. Decreased size of the hypodense splenic lesions. No splenomegaly or adenopathy in the chest abdomen or pelvis. 2. Well-circumscribed hypodense 5.7 cm segment IVa/VIII hepatic lesion which demonstrates a tubular hyperdense area of enhancement which extends laterally to a well-circumscribed 3.7 cm hyperdense lesion. 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 Recommend further evaluation with multiphase CT of the liver with without contrast. 3. Left-sided colonic diverticulosis without findings of acute diverticulitis. 4. Prostatomegaly. 5. Aortic Atherosclerosis (ICD10-I70.0).      INTERVAL HISTORY:  John [name] is here for a follow up of lymphoma. He was last seen by me on 01/09/21. He presents to the clinic accompanied by his wife. He reports he is feeling well overall and recovering from treatment. He denies any pain.   All other systems were reviewed with the patient and are negative.  MEDICAL HISTORY:  Past Medical History:  Diagnosis Date   diffuse large b cell lymphoma 04/2020  Goals of care, counseling/discussion 05/06/2020   Hypertension    Knee pain, right     SURGICAL HISTORY: Past Surgical History:  Procedure Laterality Date   FOOT SURGERY     IR IMAGING GUIDED PORT INSERTION  05/28/2020   IR  REMOVAL TUN ACCESS W/ PORT W/O FL MOD SED  02/07/2021   TONSILLECTOMY      I have reviewed the social history and family history with the patient and they are unchanged from previous note.  ALLERGIES:  has No Known Allergies.  MEDICATIONS:  Current Outpatient Medications  Medication Sig Dispense Refill   Ascorbic Acid (VITAMIN C WITH ROSE HIPS) 1000 MG tablet Take 1,000 mg by mouth daily.     doxazosin (CARDURA) 4 MG tablet Take 4 mg by mouth daily.     lidocaine-prilocaine (EMLA) cream Apply 1 application topically as needed. (Patient taking differently: Apply 1 application topically daily as needed (pain).) 30 g 0   metoprolol tartrate (LOPRESSOR) 25 MG tablet Take 0.5 tablets (12.5 mg total) by mouth 2 (two) times daily. (Patient taking differently: Take 12.5 mg by mouth in the morning.) 30 tablet 0   Multiple Vitamins-Minerals (MULTIVITAMIN PO) Take 1 tablet by mouth daily.     Omega-3 Fatty Acids (OMEGA 3 PO) Take 1 tablet by mouth daily.     No current facility-administered medications for this visit.   Facility-Administered Medications Ordered in Other Visits  Medication Dose Route Frequency Provider Last Rate Last Admin   heparin lock flush 100 unit/mL  500 Units Intracatheter PRN Truitt Merle, MD       sodium chloride flush (NS) 0.9 % injection 10 mL  10 mL Intracatheter PRN Truitt Merle, MD        PHYSICAL EXAMINATION: ECOG PERFORMANCE STATUS: 0 - Asymptomatic  Vitals:   04/11/21 0834  BP: 127/68  Pulse: 78  Resp: 17  Temp: 98 F (36.7 C)  SpO2: 94%   Wt Readings from Last 3 Encounters:  04/11/21 223 lb 14.4 oz (101.6 kg)  01/09/21 218 lb 11.2 oz (99.2 kg)  10/19/20 222 lb 3.2 oz (100.8 kg)     GENERAL:alert, no distress and comfortable SKIN: skin color, texture, turgor are normal, no rashes or significant lesions EYES: normal, Conjunctiva are pink and non-injected, sclera clear  NECK: supple, thyroid normal size, non-tender, without nodularity LYMPH:  no palpable  lymphadenopathy in the cervical, axillary  LUNGS: clear to auscultation and percussion with normal breathing effort HEART: regular rate & rhythm and no murmurs and no lower extremity edema ABDOMEN:abdomen soft, non-tender and normal bowel sounds Musculoskeletal:no cyanosis of digits and no clubbing  NEURO: alert & oriented x 3 with fluent speech, no focal motor/sensory deficits  LABORATORY DATA:  I have reviewed the data as listed CBC Latest Ref Rng & Units 04/09/2021 03/06/2021 01/09/2021  WBC 4.0 - 10.5 K/uL 4.7 4.5 4.4  Hemoglobin 13.0 - 17.0 g/dL 13.4 13.0 12.9(L)  Hematocrit 39.0 - 52.0 % 38.8(L) 38.7(L) 37.6(L)  Platelets 150 - 400 K/uL 83(L) 82(L) 68(L)     CMP Latest Ref Rng & Units 04/09/2021 03/06/2021 01/09/2021  Glucose 70 - 99 mg/dL 109(H) 99 103(H)  BUN 8 - 23 mg/dL 18 18 23   Creatinine 0.61 - 1.24 mg/dL 0.84 0.91 0.95  Sodium 135 - 145 mmol/L 141 141 141  Potassium 3.5 - 5.1 mmol/L 4.2 3.8 4.4  Chloride 98 - 111 mmol/L 106 105 105  CO2 22 - 32 mmol/L 25 25 26   Calcium 8.9 - 10.3  mg/dL 9.3 9.6 9.7  Total Protein 6.5 - 8.1 g/dL 6.6 6.6 6.5  Total Bilirubin 0.3 - 1.2 mg/dL 0.7 0.9 0.7  Alkaline Phos 38 - 126 U/L 62 71 61  AST 15 - 41 U/L 21 21 20   ALT 0 - 44 U/L 20 19 21       RADIOGRAPHIC STUDIES: I have personally reviewed the radiological images as listed and agreed with the findings in the report. CT CHEST ABDOMEN PELVIS W CONTRAST  Result Date: 04/09/2021 CLINICAL DATA:  History of large B-cell lymphoma, surveillance. EXAM: CT CHEST, ABDOMEN, AND PELVIS WITH CONTRAST TECHNIQUE: Multidetector CT imaging of the chest, abdomen and pelvis was performed following the standard protocol during bolus administration of intravenous contrast. CONTRAST:  61mL OMNIPAQUE IOHEXOL 350 MG/ML SOLN COMPARISON:  Multiple priors including most recent PET-CT October 03, 2020 and CT abdomen and pelvis May 06, 2020. FINDINGS: CT CHEST FINDINGS Cardiovascular: Aortic atherosclerosis without  aneurysmal dilation. No central pulmonary embolus on this nondedicated study. Normal size heart. No significant pericardial effusion/thickening. Coronary artery calcifications. Mediastinum/Nodes: No discrete thyroid nodule. No pathologically enlarged mediastinal, hilar or axillary lymph nodes. No supraclavicular adenopathy. The trachea and esophagus are unremarkable. Lungs/Pleura: No suspicious pulmonary nodules or masses. No focal airspace consolidation. No pleural effusion. No pneumothorax. Musculoskeletal: No aggressive lytic or blastic lesion of bone. 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 No additional suspicious hepatic lesions. Gallbladder is unremarkable. No biliary ductal dilation. Pancreas: No pancreatic ductal dilation or evidence of acute inflammation. Spleen: Decreased size of the hypodense splenic lesions with the largest lesion now measuring 2.2 x 1.8 cm on image 65/2 previously 5.0 x 3.9 cm. No new splenic lesions. Adrenals/Urinary Tract: Nodular thickening of the bilateral adrenal glands. No hydronephrosis. Left upper pole renal cyst. No solid enhancing renal mass. Urinary bladder is unremarkable for degree of distension. Stomach/Bowel: Radiopaque enteric contrast traverses the descending colon. Stomach is unremarkable for degree of distension. No pathologic dilation of small or large bowel. Left-sided colonic diverticulosis without findings of acute diverticulitis. Vascular/Lymphatic: Aortic and branch vessel atherosclerosis without abdominal aortic aneurysm. No pathologically enlarged abdominal or pelvic lymph  nodes. Reproductive: Prostatomegaly. Other: No significant abdominopelvic free fluid. 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 Multilevel degenerative changes spine. No aggressive lytic or blastic lesion of bone. Degenerative changes bilateral hips. IMPRESSION: 1. Decreased size of the hypodense splenic lesions. No splenomegaly or adenopathy in the chest abdomen or pelvis. 2. Well-circumscribed hypodense 5.7 cm segment IVa/VIII hepatic lesion which demonstrates a tubular hyperdense area of enhancement which extends laterally to a well-circumscribed 3.7 cm hyperdense lesion. 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 Recommend further evaluation with multiphase CT of the liver with without contrast. 3. Left-sided colonic diverticulosis without findings of acute diverticulitis. 4. Prostatomegaly. 5. Aortic Atherosclerosis (ICD10-I70.0). These results will be called to the ordering clinician or representative by the Radiologist Assistant, and communication documented in the PACS or [employer]. Electronically Signed   By: Dahlia Bailiff M.D.   On: 04/09/2021 13:59      No orders of the defined types were placed in this encounter.  All  questions were answered. The patient knows to call the clinic with any problems, questions or concerns. No barriers to learning was detected. The total time spent in the appointment was 30 minutes.     Truitt Merle, MD 04/11/2021   I, Wilburn Mylar, am acting as scribe for Truitt Merle, MD.   I have reviewed the above documentation for accuracy and completeness, and I agree with the above.

## 2021-04-13 ENCOUNTER — Encounter: Payer: Self-pay | Admitting: Hematology

## 2021-04-13 ENCOUNTER — Other Ambulatory Visit: Payer: Self-pay | Admitting: Hematology

## 2021-04-13 DIAGNOSIS — C8338 Diffuse large B-cell lymphoma, lymph nodes of multiple sites: Secondary | ICD-10-CM

## 2021-04-13 MED ORDER — TIXAGEVIMAB (PART OF EVUSHELD) INJECTION: 300.0000 mg | Freq: Once | INTRAMUSCULAR | Status: DC

## 2021-04-13 MED ORDER — METHYLPREDNISOLONE SODIUM SUCC 125 MG IJ SOLR: 125.0000 mg | Freq: Once | INTRAMUSCULAR | Status: DC | PRN

## 2021-04-13 MED ORDER — CILGAVIMAB (PART OF EVUSHELD) INJECTION: 300.0000 mg | Freq: Once | INTRAMUSCULAR | Status: DC

## 2021-04-13 MED ORDER — EPINEPHRINE 0.3 MG/0.3ML IJ SOAJ: 0.3000 mg | Freq: Once | INTRAMUSCULAR | Status: DC | PRN

## 2021-04-13 MED ORDER — DIPHENHYDRAMINE HCL 50 MG/ML IJ SOLN: 50.0000 mg | Freq: Once | INTRAMUSCULAR | Status: DC | PRN

## 2021-04-13 MED ORDER — ALBUTEROL SULFATE HFA 108 (90 BASE) MCG/ACT IN AERS: 2.0000 | INHALATION_SPRAY | Freq: Once | RESPIRATORY_TRACT | Status: DC | PRN

## 2021-04-15 ENCOUNTER — Telehealth: Payer: Self-pay | Admitting: Hematology

## 2021-04-15 NOTE — Telephone Encounter (Signed)
Left message with follow-up appointments per 11/3 los. 

## 2021-04-18 ENCOUNTER — Other Ambulatory Visit: Payer: Self-pay | Admitting: Hematology

## 2021-04-18 DIAGNOSIS — C8338 Diffuse large B-cell lymphoma, lymph nodes of multiple sites: Secondary | ICD-10-CM

## 2021-04-22 ENCOUNTER — Inpatient Hospital Stay: Payer: Medicare HMO

## 2021-04-22 ENCOUNTER — Other Ambulatory Visit: Payer: Self-pay

## 2021-04-22 DIAGNOSIS — D6959 Other secondary thrombocytopenia: Secondary | ICD-10-CM | POA: Diagnosis not present

## 2021-04-22 DIAGNOSIS — Z9221 Personal history of antineoplastic chemotherapy: Secondary | ICD-10-CM | POA: Diagnosis not present

## 2021-04-22 DIAGNOSIS — Z298 Encounter for other specified prophylactic measures: Secondary | ICD-10-CM | POA: Diagnosis present

## 2021-04-22 DIAGNOSIS — C8338 Diffuse large B-cell lymphoma, lymph nodes of multiple sites: Secondary | ICD-10-CM

## 2021-04-22 DIAGNOSIS — C8339 Diffuse large B-cell lymphoma, extranodal and solid organ sites: Secondary | ICD-10-CM | POA: Diagnosis present

## 2021-04-22 DIAGNOSIS — T451X5A Adverse effect of antineoplastic and immunosuppressive drugs, initial encounter: Secondary | ICD-10-CM | POA: Diagnosis not present

## 2021-04-22 MED ORDER — CILGAVIMAB (PART OF EVUSHELD) INJECTION
300.0000 mg | Freq: Once | INTRAMUSCULAR | Status: AC
  Administered 2021-04-22: 300 mg via INTRAMUSCULAR
  Filled 2021-04-22: qty 3

## 2021-04-22 MED ORDER — TIXAGEVIMAB (PART OF EVUSHELD) INJECTION
300.0000 mg | Freq: Once | INTRAMUSCULAR | Status: AC
  Administered 2021-04-22: 300 mg via INTRAMUSCULAR
  Filled 2021-04-22: qty 3

## 2021-04-22 NOTE — Patient Instructions (Signed)
Tixagevimab; Cilgavimab Solutions for Injection °What is this medication? °TIXAGEVIMAB; CILGAVIMAB (tix a jev i mab; sil gav i mab) reduces the risk of getting COVID-19 in persons with immune system problems who may not respond properly to the COVID-19 vaccine or persons who can't receive the COVID-19 vaccine. It belongs to a group of medications called monoclonal antibodies. It may decrease the risk of developing severe symptoms of COVID-19. It may also decrease the chance of going to the hospital. This medication is not approved by the FDA. The FDA has authorized emergency use of this medication during the COVID-19 pandemic. °This medicine may be used for other purposes; ask your health care provider or pharmacist if you have questions. °COMMON BRAND NAME(S): EVUSHELD °What should I tell my care team before I take this medication? °They need to know if you have any of these conditions: °any allergies °any serious illness °bleeding disorder °have received a COVID-19 vaccine °heart disease °an unusual or allergic reaction to tixagevimab, cilgavimab, other medications, foods, dyes, or preservatives °pregnant or trying to get pregnant °breast-feeding °How should I use this medication? °This medication is injected into a muscle. It is given by your care team in a hospital or clinic setting. °Talk to your care team about the use of this medication in children. While it may be given to children as young as 12 years, precautions do apply. °Overdosage: If you think you have taken too much of this medicine contact a poison control center or emergency room at once. °NOTE: This medicine is only for you. Do not share this medicine with others. °What if I miss a dose? °Keep appointments for follow-up doses. It is important not to miss your dose. Call your care team if you are unable to keep an appointment. °What may interact with this medication? °COVID-19 vaccines °This list may not describe all possible interactions. Give  your health care provider a list of all the medicines, herbs, non-prescription drugs, or dietary supplements you use. Also tell them if you smoke, drink alcohol, or use illegal drugs. Some items may interact with your medicine. °What should I watch for while using this medication? °Your condition will be monitored carefully while you are receiving this medication. Visit your care team for regular checks on your progress. Tell your care team if your symptoms do not start to get better or if they get worse. °After receiving this medication, wait at least 14 days before getting the COVID-19 vaccine. °What side effects may I notice from receiving this medication? °Side effects that you should report to your care team as soon as possible: °Allergic reactions--skin rash, itching, hives, swelling of the face, lips, tongue, or throat °Heart attack--pain or tightness in the chest, shoulders, arms, or jaw, nausea, shortness of breath, cold or clammy skin, feeling faint or lightheaded °Heart failure--shortness of breath, swelling of the ankles, feet, or hands, sudden weight gain, unusual weakness or fatigue °Heart rhythm changes--fast or irregular heartbeat, dizziness, feeling faint or lightheaded, chest pain, trouble breathing °Side effects that usually do not require medical attention (report these to your care team if they continue or are bothersome): °Cough °Fatigue °Headache °Pain, redness, or irritation at site where injected °This list may not describe all possible side effects. Call your doctor for medical advice about side effects. You may report side effects to FDA at 1-800-FDA-1088. °Where should I keep my medication? °This medication is given in a hospital or clinic. It will not be stored at home. °NOTE: This sheet is   a summary. It may not cover all possible information. If you have questions about this medicine, talk to your doctor, pharmacist, or health care provider. °© 2022 [employer]vier/Gold Standard (2020-05-17  00:00:00) ° °

## 2021-04-22 NOTE — Progress Notes (Signed)
Patient was observed for 30 minutes post evusheld injection with no adverse reaction. Vitals stable and patient in no distress upon leaving infusion room.

## 2021-05-07 ENCOUNTER — Encounter: Payer: Self-pay | Admitting: Hematology

## 2021-07-12 ENCOUNTER — Other Ambulatory Visit: Payer: Medicare HMO

## 2021-07-12 ENCOUNTER — Ambulatory Visit: Payer: Medicare HMO | Admitting: Hematology

## 2021-07-12 ENCOUNTER — Other Ambulatory Visit: Payer: Self-pay | Admitting: *Deleted

## 2021-07-12 DIAGNOSIS — C8330 Diffuse large B-cell lymphoma, unspecified site: Secondary | ICD-10-CM

## 2021-07-15 ENCOUNTER — Telehealth: Payer: Self-pay

## 2021-07-15 ENCOUNTER — Other Ambulatory Visit: Payer: Self-pay

## 2021-07-15 ENCOUNTER — Inpatient Hospital Stay: Payer: Medicare HMO | Admitting: Hematology

## 2021-07-15 ENCOUNTER — Inpatient Hospital Stay: Payer: Medicare HMO | Attending: Hematology

## 2021-07-15 ENCOUNTER — Encounter: Payer: Self-pay | Admitting: Hematology

## 2021-07-15 VITALS — BP 131/65 | HR 66 | Temp 97.9°F | Resp 16 | Ht 71.0 in | Wt 232.6 lb

## 2021-07-15 DIAGNOSIS — D709 Neutropenia, unspecified: Secondary | ICD-10-CM | POA: Diagnosis not present

## 2021-07-15 DIAGNOSIS — C8339 Diffuse large B-cell lymphoma, extranodal and solid organ sites: Secondary | ICD-10-CM | POA: Diagnosis present

## 2021-07-15 DIAGNOSIS — D6959 Other secondary thrombocytopenia: Secondary | ICD-10-CM | POA: Insufficient documentation

## 2021-07-15 DIAGNOSIS — C8338 Diffuse large B-cell lymphoma, lymph nodes of multiple sites: Secondary | ICD-10-CM | POA: Diagnosis not present

## 2021-07-15 DIAGNOSIS — C8330 Diffuse large B-cell lymphoma, unspecified site: Secondary | ICD-10-CM

## 2021-07-15 LAB — CBC WITH DIFFERENTIAL (CANCER CENTER ONLY)
Abs Immature Granulocytes: 0.03 10*3/uL (ref 0.00–0.07)
Basophils Absolute: 0 10*3/uL (ref 0.0–0.1)
Basophils Relative: 2 %
Eosinophils Absolute: 0.1 10*3/uL (ref 0.0–0.5)
Eosinophils Relative: 6 %
HCT: 37.6 % — ABNORMAL LOW (ref 39.0–52.0)
Hemoglobin: 13 g/dL (ref 13.0–17.0)
Immature Granulocytes: 2 %
Lymphocytes Relative: 25 %
Lymphs Abs: 0.4 10*3/uL — ABNORMAL LOW (ref 0.7–4.0)
MCH: 30.4 pg (ref 26.0–34.0)
MCHC: 34.6 g/dL (ref 30.0–36.0)
MCV: 88.1 fL (ref 80.0–100.0)
Monocytes Absolute: 0.7 10*3/uL (ref 0.1–1.0)
Monocytes Relative: 39 %
Neutro Abs: 0.4 10*3/uL — CL (ref 1.7–7.7)
Neutrophils Relative %: 26 %
Platelet Count: 97 10*3/uL — ABNORMAL LOW (ref 150–400)
RBC: 4.27 MIL/uL (ref 4.22–5.81)
RDW: 13.7 % (ref 11.5–15.5)
WBC Count: 1.6 10*3/uL — ABNORMAL LOW (ref 4.0–10.5)
nRBC: 0 % (ref 0.0–0.2)

## 2021-07-15 LAB — CMP (CANCER CENTER ONLY)
ALT: 16 U/L (ref 0–44)
AST: 16 U/L (ref 15–41)
Albumin: 4 g/dL (ref 3.5–5.0)
Alkaline Phosphatase: 51 U/L (ref 38–126)
Anion gap: 7 (ref 5–15)
BUN: 18 mg/dL (ref 8–23)
CO2: 29 mmol/L (ref 22–32)
Calcium: 8.8 mg/dL — ABNORMAL LOW (ref 8.9–10.3)
Chloride: 107 mmol/L (ref 98–111)
Creatinine: 0.9 mg/dL (ref 0.61–1.24)
GFR, Estimated: >60 mL/min (ref >60)
Glucose, Bld: 111 mg/dL — ABNORMAL HIGH (ref 70–99)
Potassium: 3.8 mmol/L (ref 3.5–5.1)
Sodium: 143 mmol/L (ref 135–145)
Total Bilirubin: 0.6 mg/dL (ref 0.3–1.2)
Total Protein: 6.1 g/dL — ABNORMAL LOW (ref 6.5–8.1)

## 2021-07-15 LAB — LACTATE DEHYDROGENASE: LDH: 142 U/L (ref 98–192)

## 2021-07-15 LAB — URIC ACID: Uric Acid, Serum: 8.4 mg/dL (ref 3.7–8.6)

## 2021-07-15 NOTE — Telephone Encounter (Signed)
CRITICAL VALUE STICKER  CRITICAL VALUE: ANC=0.4  RECEIVER (on-site recipient of call):Rochelle Nephew [place] NOTIFIED: 07/15/2021 10:00am  MESSENGER (representative from lab):Nira Conn  MD NOTIFIED: Burr Medico  TIME OF NOTIFICATION:07/15/2021 at 10:07am  RESPONSE: notification given to [facility] for follow-up with provider.

## 2021-07-15 NOTE — Progress Notes (Signed)
[facility]   Telephone:(336) [phone] [phone]) [phone]   Clinic Follow up Note   Patient Care Team: Christain [place], MD as PCP - General (Family Medicine)  Date of Service:  07/15/2021  CHIEF COMPLAINT: f/u of diffuse large B cell lymphoma  CURRENT THERAPY:  Surveillance  ASSESSMENT & PLAN:  John [name] is a 76 y.o. male with   1. Diffuse large B cell lymphoma, ABC Subtype, stage IV -After worsening fatigue pt presented to ED. 04/20/20 CT CAP showed large mediastinal mass and diffuse liver masses, and diffuse retroperitoneal lymphadenopathy. 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 Given his liver metastasis and LN involvement, this is stage IV. His lymphoma FISH panel is negative for double hit lymphoma -He completed first-line Inpatient R-EPOCH q3weeks for 6 cycles on 05/01/20-08/27/20. Given his high risk for CNS involvement, treatment involved intrathecal treatment with methotrexate prophylactically from C2. He is currently on surveillance/observation. -most recent CT CAP 04/09/21 showed decreased size of splenic lesions and liver lesion which were negative on last PET in 09/2020. No evidence of residual disease or recurrence.  -He continues to recover from treatment. Labs reviewed, WBC 1.6 and neutro abs 0.4. He denies any recent illness. We will recheck labs in 3-4 weeks. Physical exam was unremarkable. -F/u in 3 months with CT a few days before.   2. Thrombocytopenia, secondary to chemo -He was given GCSF after Cycle 1 due to significant pancytopenia with neutropenic fever. He was treated as inpatient and required blood and platelet transfusion.  -He last required blood transfusion on 06/14/20 and 09/04/20.  -S/p treatment, his labs improved overall. His plt have improved but remain low at 97k today (07/15/21)    3. New onset neutropenia -CBC showed new neutropenia with ANC 0.4, no recent infection  or new meds -not sure about the etiology, will repeat CBC in 3-4 weeks -we discussed neutropenic fever precautions.    PLAN:  -lab in 3-4 weeks -f/u in 3 months with lab and CT CAP several days before   No problem-specific Assessment & Plan notes found for this encounter.   SUMMARY OF ONCOLOGIC HISTORY: Oncology History Overview Note  Cancer Staging Diffuse large B cell lymphoma ([place]) Staging form: Hodgkin and Non-Hodgkin Lymphoma, AJCC 8th Edition - Clinical stage from 04/23/2020: Stage IV (Diffuse large B-cell lymphoma) - Signed by Truitt Merle, MD on 04/29/2020 Stage prefix: Initial diagnosis    Diffuse large B cell lymphoma ([place])  04/20/2020 Imaging   CT CAP  IMPRESSION: 1. Large anterior mediastinal/prevascular space mass/adenopathy. The mass extends superiorly and abuts the upper trachea. 2. Large hypodense lesion in the right lobe of the liver as well as ill-defined splenic hypodense lesions. Further characterization with MRI without and with contrast recommended. The liver lesion is amenable to percutaneous tissue sampling. 3. Extensive mesenteric and retroperitoneal adenopathy. 4. Sigmoid diverticulosis. No bowel obstruction. Normal appendix. 5. Aortic Atherosclerosis (ICD10-I70.0).   04/23/2020 Cancer Staging   Staging form: Hodgkin and Non-Hodgkin Lymphoma, AJCC 8th Edition - Clinical stage from 04/23/2020: Stage IV (Diffuse large B-cell lymphoma) - Signed by Truitt Merle, MD on 04/29/2020    04/23/2020 Initial Biopsy   FINAL MICROSCOPIC DIAGNOSIS:   A. LIVER, RIGHT MASS, NEEDLE CORE BIOPSY:  - Large B-cell lymphoma  - See comment     COMMENT:   The sections show needle core biopsy fragments displaying effacement of  the architecture by a dense infiltrate of primarily large atypical  lymphoid appearing cells displaying vesicular chromatin and small  nucleoli.  This is associated with apoptosis and brisk mitosis.  The  appearance is diffuse with lack of  atypical follicles.  A large battery  of immunohistochemical stains was performed and shows that the atypical  lymphoid cells are positive for LCA, CD20, PAX 5, BCL-2, BCL6, CD5,  MUM-1 (partial).  The atypical lymphoid cells are negative for CD10,  CD30, CD34, CD138, cyclin D1, TdT, and EBV in situ hybridization,  synaptophysin, cytokeratin AE1/AE3, cytokeratin 20, cytokeratin 7,  cytokeratin 5/6, TTF-1, CD56, CDX2, and p40.  There is an admixed  relatively minor population of T-cells as primarily seen with CD3.  The  overall features are consistent with large B-cell lymphoma, ABC subtype.  The lymphomatous process displays expression of CD5.  This phenotype is  seen in 5-10% of mostly de novo large B-cell lymphoma cases or rarely as  a transformation of a low-grade B-cell lymphoma such as small  lymphocytic lymphoma.  Clinical correlation is recommended.    04/25/2020 Imaging   CT AP  IMPRESSION: 1. Development of high-density ascites in the abdomen and pelvis. Findings are most compatible with a small to moderate amount of hemoperitoneum and related to the recent liver biopsy. 2. Extensive lymphadenopathy throughout the abdomen and pelvis with a large mass or nodal mass near the pancreatic head. There are additional lesions involving the liver and spleen. Findings are suggestive for metastatic disease. 3. Development of small bilateral pleural effusions.   These results were called by telephone at the time of interpretation on 04/25/2020 at 1:43 pm to provider ERIC [place] who verbally acknowledged these results.   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 Completed      07/04/2020 PET scan   IMPRESSION: 1. Marked reduction in volume of anterior mediastinal mass now with low metabolic  activity ( [place] 2). 2. Reduction in volume of retroperitoneal nodal mass adjacent the pancreas with low metabolic activity ( [place] 3). 3. No increased metabolic activity associated with the spleen 4. Interval contraction of hepatic hematoma post biopsy. No hypermetabolic elements within the hepatic lesion. 5. Grade 2 anterolisthesis L4 on L5.     10/03/2020 Imaging   PET IMPRESSION: 1. No evidence of residual or recurrent lymphoma. 2. Heterogeneous bilobed mass in the liver, unchanged and reportedly due to chronic post biopsy hematomas. 3. Enlarged prostate.  Associated bladder wall thickening. 4. Aortic atherosclerosis (ICD10-I70.0). Coronary artery calcification.     04/09/2021 Imaging   CT CAP  IMPRESSION: 1. Decreased size of the hypodense splenic lesions. No splenomegaly or adenopathy in the chest abdomen or pelvis. 2. Well-circumscribed hypodense 5.7 cm segment IVa/VIII hepatic lesion which demonstrates a tubular hyperdense area of enhancement which extends laterally to a well-circumscribed 3.7 cm hyperdense lesion. 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 Recommend further evaluation with multiphase CT of the liver with without contrast. 3. Left-sided colonic diverticulosis without findings of acute diverticulitis. 4. Prostatomegaly. 5. Aortic Atherosclerosis (ICD10-I70.0).      INTERVAL HISTORY:  John [name] is here for a follow up of lymphoma. He was last seen by me on 04/11/21. He presents to the clinic accompanied by his wife. He reports he is doing well overall, no new concerns.   All other systems were  reviewed with the patient and are negative.  MEDICAL HISTORY:  Past Medical History:  Diagnosis Date   diffuse large b cell lymphoma 04/2020   Goals of care, counseling/discussion 05/06/2020   Hypertension    Knee pain, right     SURGICAL  HISTORY: Past Surgical History:  Procedure Laterality Date   FOOT SURGERY     IR IMAGING GUIDED PORT INSERTION  05/28/2020   IR REMOVAL TUN ACCESS W/ PORT W/O FL MOD SED  02/07/2021   TONSILLECTOMY      I have reviewed the social history and family history with the patient and they are unchanged from previous note.  ALLERGIES:  has No Known Allergies.  MEDICATIONS:  Current Outpatient Medications  Medication Sig Dispense Refill   Ascorbic Acid (VITAMIN C WITH ROSE HIPS) 1000 MG tablet Take 1,000 mg by mouth daily.     doxazosin (CARDURA) 4 MG tablet Take 4 mg by mouth daily.     metoprolol tartrate (LOPRESSOR) 25 MG tablet Take 0.5 tablets (12.5 mg total) by mouth 2 (two) times daily. (Patient taking differently: Take 12.5 mg by mouth in the morning.) 30 tablet 0   Multiple Vitamins-Minerals (MULTIVITAMIN PO) Take 1 tablet by mouth daily.     Omega-3 Fatty Acids (OMEGA 3 PO) Take 1 tablet by mouth daily.     No current facility-administered medications for this visit.   Facility-Administered Medications Ordered in Other Visits  Medication Dose Route Frequency Provider Last Rate Last Admin   heparin lock flush 100 unit/mL  500 Units Intracatheter PRN Truitt Merle, MD       sodium chloride flush (NS) 0.9 % injection 10 mL  10 mL Intracatheter PRN Truitt Merle, MD        PHYSICAL EXAMINATION: ECOG PERFORMANCE STATUS: 0 - Asymptomatic  Vitals:   07/15/21 0956  BP: 131/65  Pulse: 66  Resp: 16  Temp: 97.9 F (36.6 C)  SpO2: 98%   Wt Readings from Last 3 Encounters:  07/15/21 232 lb 9.6 oz (105.5 kg)  04/11/21 223 lb 14.4 oz (101.6 kg)  01/09/21 218 lb 11.2 oz (99.2 kg)     GENERAL:alert, no distress and comfortable SKIN: skin color, texture, turgor are normal, no rashes or significant lesions EYES: normal, Conjunctiva are pink and non-injected, sclera clear  LUNGS: clear to auscultation and percussion with normal breathing effort HEART: regular rate & rhythm and no murmurs and  no lower extremity edema ABDOMEN:abdomen soft, non-tender and normal bowel sounds Musculoskeletal:no cyanosis of digits and no clubbing  NEURO: alert & oriented x 3 with fluent speech, no focal motor/sensory deficits  LABORATORY DATA:  I have reviewed the data as listed CBC Latest Ref Rng & Units 07/15/2021 04/09/2021 03/06/2021  WBC 4.0 - 10.5 K/uL 1.6(L) 4.7 4.5  Hemoglobin 13.0 - 17.0 g/dL 13.0 13.4 13.0  Hematocrit 39.0 - 52.0 % 37.6(L) 38.8(L) 38.7(L)  Platelets 150 - 400 K/uL 97(L) 83(L) 82(L)     CMP Latest Ref Rng & Units 07/15/2021 04/09/2021 03/06/2021  Glucose 70 - 99 mg/dL 111(H) 109(H) 99  BUN 8 - 23 mg/dL 18 18 18   Creatinine 0.61 - 1.24 mg/dL 0.90 0.84 0.91  Sodium 135 - 145 mmol/L 143 141 141  Potassium 3.5 - 5.1 mmol/L 3.8 4.2 3.8  Chloride 98 - 111 mmol/L 107 106 105  CO2 22 - 32 mmol/L 29 25 25   Calcium 8.9 - 10.3 mg/dL 8.8(L) 9.3 9.6  Total Protein 6.5 - 8.1 g/dL 6.1(L) 6.6 6.6  Total  Bilirubin 0.3 - 1.2 mg/dL 0.6 0.7 0.9  Alkaline Phos 38 - 126 U/L 51 62 71  AST 15 - 41 U/L 16 21 21   ALT 0 - 44 U/L 16 20 19       RADIOGRAPHIC STUDIES: I have personally reviewed the radiological images as listed and agreed with the findings in the report. No results found.    Orders Placed This Encounter  Procedures   CBC with Differential/Platelet    Standing Status:   Standing    Number of Occurrences:   50    Standing Expiration Date:   07/15/2022   Comprehensive metabolic panel    Standing Status:   Standing    Number of Occurrences:   50    Standing Expiration Date:   07/15/2022   Lactate dehydrogenase    Standing Status:   Standing    Number of Occurrences:   20    Standing Expiration Date:   07/15/2022   All questions were answered. The patient knows to call the clinic with any problems, questions or concerns. No barriers to learning was detected. The total time spent in the appointment was 30 minutes.     Truitt Merle, MD 07/15/2021   I, Wilburn Mylar, am acting  as scribe for Truitt Merle, MD.   I have reviewed the above documentation for accuracy and completeness, and I agree with the above.

## 2021-07-15 NOTE — Telephone Encounter (Signed)
Lab called to report a critical ANC of 0.4 today.  Pt currently not receiving chemotherapy.  Notified Dr. Burr Medico of pt's [place].

## 2021-07-20 ENCOUNTER — Other Ambulatory Visit: Payer: Self-pay | Admitting: Hematology

## 2021-07-20 DIAGNOSIS — E876 Hypokalemia: Secondary | ICD-10-CM

## 2021-07-20 DIAGNOSIS — C8338 Diffuse large B-cell lymphoma, lymph nodes of multiple sites: Secondary | ICD-10-CM

## 2021-07-23 IMAGING — CT NM PET TUM IMG INITIAL (PI) SKULL BASE T - THIGH
1 of 7 series · 1 of 25 positions shown · non-contrast
Comparison: CT 04/25/2020

CLINICAL DATA: Subsequent treatment strategy for B-cell lymphoma.

EXAM:
NUCLEAR MEDICINE PET SKULL BASE TO THIGH
TECHNIQUE: 10.1 mCi F-18 FDG was injected intravenously. Full-ring PET imaging
was performed from the skull base to thigh after the radiotracer. CT
data was obtained and used for attenuation correction and anatomic
localization.
Fasting blood glucose: Not provided mg/dl

[Series 4: ct sk_thigh 5.0 bf37 · axial · 5.0mm · 0.98mm/px · 1 of 234 slices shown]
[im 234/234  brain]
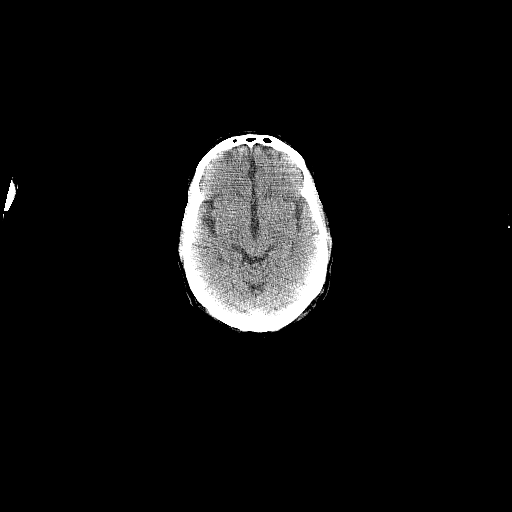

[1 of 25 positions shown; findings below may reference images not displayed]

FINDINGS: Mediastinal blood pool activity: SUV max

Liver activity: SUV max

NECK: No hypermetabolic lymph nodes in the neck.

Incidental CT findings: none

CHEST: Interval reduction in size of anterior mediastinal mass
measuring 2.7 x 1.4 cm compared with 6.3 x 4.0 cm. Lesion has low
metabolic activity with SUV max similar to background blood pool
with SUV max equal 2.6.

No suspicious pulmonary nodules.

Incidental CT findings: Port in the anterior chest wall with tip in
distal SVC.

ABDOMEN/PELVIS: Photopenia centrally within liver associated with
the hepatic mass. High-density within the mass from recent biopsy
hematoma. Mass measures 6.7 x 6.4 cm which is contracted from 9.8 by
9.5 cm. No underlying hypermetabolic activity within the mass.

Previously seen nodal mass at the head of the pancreas reduced in
size and difficult define on noncontrast exam measuring
approximately 1.7 x 1.1 cm decreased from 4.3 x 3.3 cm. This reduced
nodal mass has very low metabolic activity SUV max equal 3.4 which
is similar to liver activity.

No hypermetabolic adenopathy in the abdomen or pelvis. Normal volume
spleen with normal metabolic activity splenule anterior to the
spleen. No inguinal adenopathy.

Incidental CT findings: Diverticulosis of the sigmoid colon.
Atherosclerotic calcification of the aorta. Prostate enlarged

SKELETON: No hypermetabolic lesions in the spine.

Incidental CT findings: Grade 2 anterolisthesis of L4 on L5.
Probable bilateral pars defects.
IMPRESSION: 1.
Marked reduction in volume of anterior mediastinal mass now with low
metabolic activity ( [[facility]OSPITAL] 2).
2. Reduction in volume of retroperitoneal nodal mass adjacent the
pancreas with low metabolic activity ( [HOSPITAL] 3).
3. No increased metabolic activity associated with the spleen
4. Interval contraction of hepatic hematoma post biopsy. No
hypermetabolic elements within the hepatic lesion.
5. Grade 2 anterolisthesis L4 on L5.

## 2021-08-05 ENCOUNTER — Inpatient Hospital Stay: Payer: Medicare HMO

## 2021-08-05 ENCOUNTER — Other Ambulatory Visit: Payer: Self-pay

## 2021-08-05 DIAGNOSIS — C8338 Diffuse large B-cell lymphoma, lymph nodes of multiple sites: Secondary | ICD-10-CM

## 2021-08-05 DIAGNOSIS — C8339 Diffuse large B-cell lymphoma, extranodal and solid organ sites: Secondary | ICD-10-CM | POA: Diagnosis not present

## 2021-08-05 LAB — COMPREHENSIVE METABOLIC PANEL
ALT: 18 U/L (ref 0–44)
AST: 17 U/L (ref 15–41)
Albumin: 4.1 g/dL (ref 3.5–5.0)
Alkaline Phosphatase: 51 U/L (ref 38–126)
Anion gap: 6 (ref 5–15)
BUN: 22 mg/dL (ref 8–23)
CO2: 27 mmol/L (ref 22–32)
Calcium: 9.5 mg/dL (ref 8.9–10.3)
Chloride: 107 mmol/L (ref 98–111)
Creatinine, Ser: 0.96 mg/dL (ref 0.61–1.24)
GFR, Estimated: >60 mL/min (ref >60)
Glucose, Bld: 116 mg/dL — ABNORMAL HIGH (ref 70–99)
Potassium: 4 mmol/L (ref 3.5–5.1)
Sodium: 140 mmol/L (ref 135–145)
Total Bilirubin: 0.6 mg/dL (ref 0.3–1.2)
Total Protein: 6.5 g/dL (ref 6.5–8.1)

## 2021-08-05 LAB — CBC WITH DIFFERENTIAL/PLATELET
Abs Immature Granulocytes: 0.03 10*3/uL (ref 0.00–0.07)
Basophils Absolute: 0.1 10*3/uL (ref 0.0–0.1)
Basophils Relative: 1 %
Eosinophils Absolute: 0.2 10*3/uL (ref 0.0–0.5)
Eosinophils Relative: 4 %
HCT: 39.3 % (ref 39.0–52.0)
Hemoglobin: 13.7 g/dL (ref 13.0–17.0)
Immature Granulocytes: 1 %
Lymphocytes Relative: 11 %
Lymphs Abs: 0.5 10*3/uL — ABNORMAL LOW (ref 0.7–4.0)
MCH: 30.4 pg (ref 26.0–34.0)
MCHC: 34.9 g/dL (ref 30.0–36.0)
MCV: 87.1 fL (ref 80.0–100.0)
Monocytes Absolute: 0.6 10*3/uL (ref 0.1–1.0)
Monocytes Relative: 14 %
Neutro Abs: 3.3 10*3/uL (ref 1.7–7.7)
Neutrophils Relative %: 69 %
Platelets: 98 10*3/uL — ABNORMAL LOW (ref 150–400)
RBC: 4.51 MIL/uL (ref 4.22–5.81)
RDW: 13.3 % (ref 11.5–15.5)
WBC: 4.7 10*3/uL (ref 4.0–10.5)
nRBC: 0 % (ref 0.0–0.2)

## 2021-08-05 LAB — LACTATE DEHYDROGENASE: LDH: 144 U/L (ref 98–192)

## 2021-08-19 IMAGING — RF DG FLUORO GUIDE SPINAL/SI JT INJ*L*
2 series · 2 of 2 positions shown · non-contrast
Comparison: none

CLINICAL DATA: Diffuse large B-cell lymphoma. Intrathecal
chemotherapy.

[Series 1: fluoro_iodine 2fps_bw · 0.17mm/px · 1 of 1 slices shown]
[im 1/1]
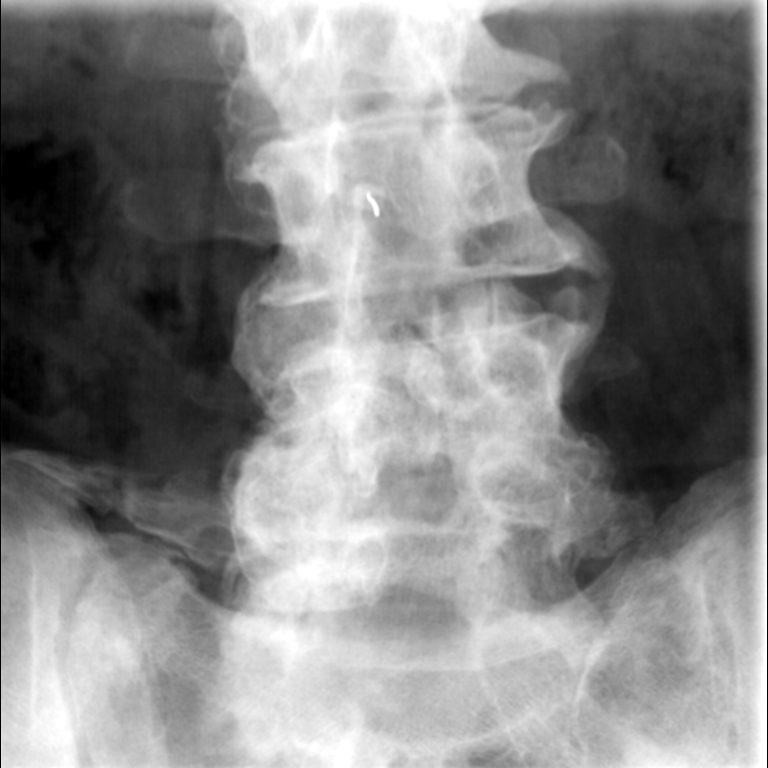

[Series 2: cp_standard · 0.17mm/px · 1 of 1 slices shown]
[im 1/1]
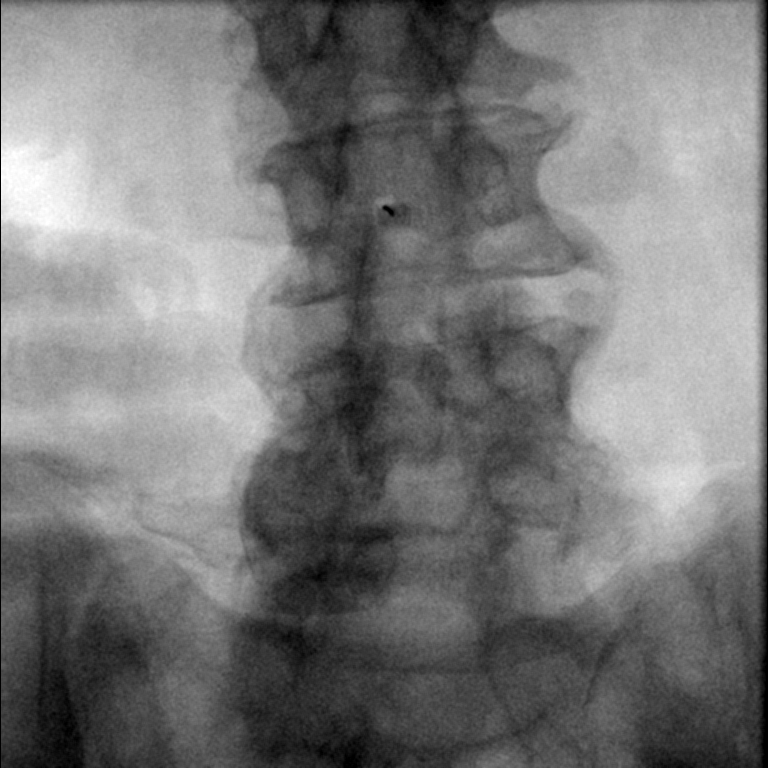

[2 of 2 positions shown; findings below may reference images not displayed]

EXAM:
DIAGNOSTIC LUMBAR PUNCTURE UNDER FLUOROSCOPIC GUIDANCE

FLUOROSCOPY TIME:  Fluoroscopy Time:  2 minutes

Radiation Exposure Index (if provided by the fluoroscopic device):
13.9 mGy

Number of Acquired Spot Images: 1

PROCEDURE:
Informed consent was obtained from the patient prior to the
procedure, including potential complications of headache, allergy,
and pain. With the patient prone, the lower back was prepped with
Betadine. 1% Lidocaine was used for local anesthesia. Lumbar
puncture was performed at the L3-4 level using a 20 gauge needle
with return of clear CSF with an opening pressure of 12 cm water.
Five ml of CSF were initially removed. Next, Methotrexate solution
was injected in subarachnoid space without
Complication. The patient tolerated the procedure well and there
were no apparent complications.
IMPRESSION: Successful intrathecal methotrexate injections in fluoroscopic
guidance.

## 2021-09-18 ENCOUNTER — Other Ambulatory Visit: Payer: Self-pay

## 2021-09-18 ENCOUNTER — Other Ambulatory Visit: Payer: Self-pay | Admitting: Hematology

## 2021-09-18 DIAGNOSIS — C8338 Diffuse large B-cell lymphoma, lymph nodes of multiple sites: Secondary | ICD-10-CM

## 2021-10-07 ENCOUNTER — Inpatient Hospital Stay: Payer: Medicare HMO | Attending: Hematology

## 2021-10-07 ENCOUNTER — Other Ambulatory Visit: Payer: Self-pay

## 2021-10-07 ENCOUNTER — Encounter (HOSPITAL_COMMUNITY): Payer: Self-pay

## 2021-10-07 ENCOUNTER — Ambulatory Visit (HOSPITAL_COMMUNITY)
Admission: RE | Admit: 2021-10-07 | Discharge: 2021-10-07 | Disposition: A | Discharge status: Home / Self Care | Payer: Medicare HMO | Source: Ambulatory Visit | Attending: Hematology | Admitting: Hematology

## 2021-10-07 DIAGNOSIS — C8338 Diffuse large B-cell lymphoma, lymph nodes of multiple sites: Secondary | ICD-10-CM

## 2021-10-07 DIAGNOSIS — D701 Agranulocytosis secondary to cancer chemotherapy: Secondary | ICD-10-CM | POA: Insufficient documentation

## 2021-10-07 DIAGNOSIS — T451X5D Adverse effect of antineoplastic and immunosuppressive drugs, subsequent encounter: Secondary | ICD-10-CM | POA: Insufficient documentation

## 2021-10-07 DIAGNOSIS — C8339 Diffuse large B-cell lymphoma, extranodal and solid organ sites: Secondary | ICD-10-CM | POA: Insufficient documentation

## 2021-10-07 DIAGNOSIS — D6959 Other secondary thrombocytopenia: Secondary | ICD-10-CM | POA: Insufficient documentation

## 2021-10-07 LAB — CBC WITH DIFFERENTIAL/PLATELET
Abs Immature Granulocytes: 0.03 10*3/uL (ref 0.00–0.07)
Basophils Absolute: 0 10*3/uL (ref 0.0–0.1)
Basophils Relative: 1 %
Eosinophils Absolute: 0.1 10*3/uL (ref 0.0–0.5)
Eosinophils Relative: 3 %
HCT: 41 % (ref 39.0–52.0)
Hemoglobin: 13.9 g/dL (ref 13.0–17.0)
Immature Granulocytes: 1 %
Lymphocytes Relative: 13 %
Lymphs Abs: 0.5 10*3/uL — ABNORMAL LOW (ref 0.7–4.0)
MCH: 29.6 pg (ref 26.0–34.0)
MCHC: 33.9 g/dL (ref 30.0–36.0)
MCV: 87.4 fL (ref 80.0–100.0)
Monocytes Absolute: 0.7 10*3/uL (ref 0.1–1.0)
Monocytes Relative: 15 %
Neutro Abs: 3 10*3/uL (ref 1.7–7.7)
Neutrophils Relative %: 67 %
Platelets: 100 10*3/uL — ABNORMAL LOW (ref 150–400)
RBC: 4.69 MIL/uL (ref 4.22–5.81)
RDW: 13.7 % (ref 11.5–15.5)
WBC: 4.3 10*3/uL (ref 4.0–10.5)
nRBC: 0 % (ref 0.0–0.2)

## 2021-10-07 LAB — LACTATE DEHYDROGENASE: LDH: 154 U/L (ref 98–192)

## 2021-10-07 LAB — COMPREHENSIVE METABOLIC PANEL
ALT: 20 U/L (ref 0–44)
AST: 17 U/L (ref 15–41)
Albumin: 4.2 g/dL (ref 3.5–5.0)
Alkaline Phosphatase: 55 U/L (ref 38–126)
Anion gap: 7 (ref 5–15)
BUN: 19 mg/dL (ref 8–23)
CO2: 27 mmol/L (ref 22–32)
Calcium: 9.6 mg/dL (ref 8.9–10.3)
Chloride: 106 mmol/L (ref 98–111)
Creatinine, Ser: 0.96 mg/dL (ref 0.61–1.24)
GFR, Estimated: >60 mL/min (ref >60)
Glucose, Bld: 106 mg/dL — ABNORMAL HIGH (ref 70–99)
Potassium: 4 mmol/L (ref 3.5–5.1)
Sodium: 140 mmol/L (ref 135–145)
Total Bilirubin: 0.7 mg/dL (ref 0.3–1.2)
Total Protein: 6.5 g/dL (ref 6.5–8.1)

## 2021-10-07 MED ORDER — SODIUM CHLORIDE (PF) 0.9 % IJ SOLN
INTRAMUSCULAR | Status: AC
Start: 2021-10-07 — End: 2021-10-07
  Filled 2021-10-07: qty 50

## 2021-10-07 MED ORDER — IOHEXOL 300 MG/ML  SOLN
100.0000 mL | Freq: Once | INTRAMUSCULAR | Status: AC | PRN
  Administered 2021-10-07: 100 mL via INTRAVENOUS

## 2021-10-09 ENCOUNTER — Telehealth: Payer: Self-pay | Admitting: Hematology

## 2021-10-09 NOTE — Telephone Encounter (Signed)
.  Called patient to schedule appointment per 5/3 inbasket, patient is aware of date and time.   ?

## 2021-10-10 ENCOUNTER — Inpatient Hospital Stay: Payer: Medicare HMO | Admitting: Hematology

## 2021-10-10 ENCOUNTER — Other Ambulatory Visit: Payer: Self-pay

## 2021-10-10 VITALS — BP 142/63 | HR 67 | Temp 97.8°F | Resp 18 | Ht 71.0 in | Wt 227.5 lb

## 2021-10-10 DIAGNOSIS — D6959 Other secondary thrombocytopenia: Secondary | ICD-10-CM | POA: Diagnosis not present

## 2021-10-10 DIAGNOSIS — T451X5D Adverse effect of antineoplastic and immunosuppressive drugs, subsequent encounter: Secondary | ICD-10-CM | POA: Diagnosis not present

## 2021-10-10 DIAGNOSIS — C8339 Diffuse large B-cell lymphoma, extranodal and solid organ sites: Secondary | ICD-10-CM | POA: Diagnosis present

## 2021-10-10 DIAGNOSIS — D701 Agranulocytosis secondary to cancer chemotherapy: Secondary | ICD-10-CM | POA: Diagnosis not present

## 2021-10-10 DIAGNOSIS — C8338 Diffuse large B-cell lymphoma, lymph nodes of multiple sites: Secondary | ICD-10-CM

## 2021-10-10 NOTE — Progress Notes (Signed)
?[place]   ?[phone] [phone]   ?Clinic Follow up Note  ? ?Patient Care Team: ?Christain Sacramento, MD as PCP - General (Family Medicine) ? ?Date of Service:  10/10/2021 ? ?CHIEF COMPLAINT: f/u of diffuse large B cell lymphoma ? ?CURRENT THERAPY:  ?Surveillance ? ?ASSESSMENT & PLAN:  ?John [name] is a 76 y.o. male with  ? ?1. Diffuse large B cell lymphoma, ABC Subtype, stage IV ?-After worsening fatigue pt presented to ED. 04/20/20 CT CAP showed large mediastinal mass and diffuse liver masses, and diffuse retroperitoneal lymphadenopathy. ?-Liver biopsy from 04/23/20 shows diffuse large B-Cell lymphoma, ABC subtype, which carries a worse prognosis. Given his liver metastasis and LN involvement, this is stage IV. His lymphoma FISH panel is negative for double hit lymphoma ?-He completed first-line Inpatient R-EPOCH q3weeks for 6 cycles on 05/01/20-08/27/20. Given his high risk for CNS involvement, treatment involved intrathecal treatment with methotrexate prophylactically from C2. He is currently on surveillance/observation. ?-most recent CT CAP 10/07/21 showed decreased size of splenic lesions, and stable liver lesion which is likely the hematoma from previous of liver biopsy.  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  I reviewed the results with them today. ?-He continues to recover from treatment. Labs from 10/07/21 reviewed, WBC and neutro abs now WNL. Physical exam was unremarkable. ?-F/u in 3 months, plan to repeat one more CT in 6 months  ?  ?2. Thrombocytopenia, secondary to chemo ?-He was given GCSF after Cycle 1 due to significant pancytopenia with neutropenic fever. He was treated as inpatient and required blood and platelet transfusion.  ?-He last required blood transfusion on 06/14/20 and 09/04/20.  ?-S/p treatment, his labs improved overall. His plt have improved but remain low at 100k today  (10/10/21) ?   ?  ?PLAN:  ?-Lab and scan reviewed, no evidence of relapse  ?-lab and f/u in 3 months ? ? ?No problem-specific Assessment & Plan notes found for this encounter. ? ? ?SUMMARY OF ONCOLOGIC HISTORY: ?Oncology History Overview Note  ?Cancer Staging ?Diffuse large B cell lymphoma ([place]) ?Staging form: Hodgkin and Non-Hodgkin Lymphoma, AJCC 8th Edition ?- Clinical stage from 04/23/2020: Stage IV (Diffuse large B-cell lymphoma) - Signed by Truitt Merle, MD on 04/29/2020 ?Stage prefix: Initial diagnosis ? ?  ?Diffuse large B cell lymphoma ([place])  ?04/20/2020 Imaging  ? CT CAP  ?IMPRESSION: ?1. Large anterior mediastinal/prevascular space mass/adenopathy. The ?mass extends superiorly and abuts the upper trachea. ?2. Large hypodense lesion in the right lobe of the liver as well as ?ill-defined splenic hypodense lesions. Further characterization with ?MRI without and with contrast recommended. The liver lesion is ?amenable to percutaneous tissue sampling. ?3. Extensive mesenteric and retroperitoneal adenopathy. ?4. Sigmoid diverticulosis. No bowel obstruction. Normal appendix. ?5. Aortic Atherosclerosis (ICD10-I70.0). ?  ?04/23/2020 Cancer Staging  ? Staging form: Hodgkin and Non-Hodgkin Lymphoma, AJCC 8th Edition ?- Clinical stage from 04/23/2020: Stage IV (Diffuse large B-cell lymphoma) - Signed by Truitt Merle, MD on 04/29/2020 ? ?  ?04/23/2020 Initial Biopsy  ? FINAL MICROSCOPIC DIAGNOSIS:  ? ?A. LIVER, RIGHT MASS, NEEDLE CORE BIOPSY:  ?- Large B-cell lymphoma  ?- See comment  ? ? ? ?COMMENT:  ? ?The sections show needle core biopsy fragments displaying effacement of  ?the architecture by a dense infiltrate of primarily large atypical  ?lymphoid appearing cells displaying vesicular chromatin and small  ?nucleoli.  This is associated with apoptosis and  brisk mitosis.  The  ?appearance is diffuse with lack of atypical follicles.  A large battery  ?of immunohistochemical stains was performed and shows that the atypical   ?lymphoid cells are positive for LCA, CD20, PAX 5, BCL-2, BCL6, CD5,  ?MUM-1 (partial).  The atypical lymphoid cells are negative for CD10,  ?CD30, CD34, CD138, cyclin D1, TdT, and EBV in situ hybridization,  ?synaptophysin, cytokeratin AE1/AE3, cytokeratin 20, cytokeratin 7,  ?cytokeratin 5/6, TTF-1, CD56, CDX2, and p40.  There is an admixed  ?relatively minor population of T-cells as primarily seen with CD3.  The  ?overall features are consistent with large B-cell lymphoma, ABC subtype.  ?The lymphomatous process displays expression of CD5.  This phenotype is  ?seen in 5-10% of mostly de novo large B-cell lymphoma cases or rarely as  ?a transformation of a low-grade B-cell lymphoma such as small  ?lymphocytic lymphoma.  Clinical correlation is recommended.  ?  ?04/25/2020 Imaging  ? CT AP  ?IMPRESSION: ?1. Development of high-density ascites in the abdomen and pelvis. ?Findings are most compatible with a small to moderate amount of ?hemoperitoneum and related to the recent liver biopsy. ?2. Extensive lymphadenopathy throughout the abdomen and pelvis with ?a large mass or nodal mass near the pancreatic head. There are ?additional lesions involving the liver and spleen. Findings are ?suggestive for metastatic disease. ?3. Development of small bilateral pleural effusions. ?  ?These results were called by telephone at the time of interpretation ?on 04/25/2020 at 1:43 pm to provider ERIC [place] , who verbally ?acknowledged these results. ?  ?04/26/2020 Initial Diagnosis  ? 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 ?  ?05/01/2020 - 08/27/2020 Chemotherapy  ? Inpatient R-EPOCH q3weeks for 6 cycles starting 05/01/20 with intrathecal prophylactic treatment starting with C2. Completed  ? ? ?  ?07/04/2020 PET scan  ? IMPRESSION: ?1. ?Marked reduction in volume of anterior mediastinal mass now with low ?metabolic activity ( Deauville 2). ?2. Reduction in volume of retroperitoneal nodal mass adjacent the ?pancreas with low  metabolic activity ( Deauville 3). ?3. No increased metabolic activity associated with the spleen ?4. Interval contraction of hepatic hematoma post biopsy. No ?hypermetabolic elements within the hepatic lesion. ?5. Grade 2 anterolisthesis L4 on L5. ?  ?  ?10/03/2020 Imaging  ? PET ?IMPRESSION: ?1. No evidence of residual or recurrent lymphoma. ?2. Heterogeneous bilobed mass in the liver, unchanged and reportedly ?due to chronic post biopsy hematomas. ?3. Enlarged prostate.  Associated bladder wall thickening. ?4. Aortic atherosclerosis (ICD10-I70.0). Coronary artery ?calcification. ?  ?  ?04/09/2021 Imaging  ? CT CAP ? ?IMPRESSION: ?1. Decreased size of the hypodense splenic lesions. No splenomegaly or adenopathy in the chest abdomen or pelvis. ?2. Well-circumscribed hypodense 5.7 cm segment IVa/VIII hepatic ?lesion which demonstrates a tubular hyperdense area of enhancement which extends laterally to a well-circumscribed 3.7 cm hyperdense lesion. 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 Recommend further evaluation with multiphase CT of the liver with without contrast. ?3. Left-sided colonic diverticulosis without findings of acute ?diverticulitis. ?4. Prostatomegaly. ?5. Aortic Atherosclerosis (ICD10-I70.0). ?  ? ? ? ?INTERVAL HISTORY:  ?TALLON GERTZ is here for a follow up of lymphoma. He was last seen by me on 07/15/21. He presents to the clinic accompanied by his wife. ?He reports he is doing well overall. ?  ?All other systems were reviewed with the patient and are negative. ? ?MEDICAL HISTORY:  ?Past Medical History:  ?Diagnosis Date  ?  diffuse large b cell lymphoma 04/2020  ? Goals of care, counseling/discussion 05/06/2020  ? Hypertension   ? Knee pain, right   ? ? ?SURGICAL HISTORY: ?Past Surgical History:  ?Procedure Laterality Date  ? FOOT SURGERY    ? IR IMAGING GUIDED PORT INSERTION  05/28/2020  ? IR  REMOVAL TUN ACCESS W/ PORT W/O FL MOD SED  02/07/2021  ? TONSILLECTOMY    ? ? ?I have reviewed the social history and family history with the patient and they are unchanged from previous note. ? ?ALLERGIES:  has

## 2021-10-12 ENCOUNTER — Encounter: Payer: Self-pay | Admitting: Hematology

## 2021-10-16 ENCOUNTER — Telehealth: Payer: Self-pay | Admitting: Hematology

## 2021-10-16 NOTE — Telephone Encounter (Signed)
Left message with follow-up appointment per 5/4 los. ?

## 2021-12-30 ENCOUNTER — Other Ambulatory Visit: Payer: Self-pay

## 2022-01-07 ENCOUNTER — Other Ambulatory Visit: Payer: Self-pay

## 2022-01-13 ENCOUNTER — Inpatient Hospital Stay: Payer: Medicare HMO

## 2022-01-13 ENCOUNTER — Encounter: Payer: Self-pay | Admitting: Hematology

## 2022-01-13 ENCOUNTER — Inpatient Hospital Stay: Payer: Medicare HMO | Attending: Hematology | Admitting: Hematology

## 2022-01-13 VITALS — BP 118/61 | HR 66 | Temp 98.4°F | Resp 18 | Ht 71.0 in | Wt 224.5 lb

## 2022-01-13 DIAGNOSIS — C8338 Diffuse large B-cell lymphoma, lymph nodes of multiple sites: Secondary | ICD-10-CM

## 2022-01-13 DIAGNOSIS — C8339 Diffuse large B-cell lymphoma, extranodal and solid organ sites: Secondary | ICD-10-CM | POA: Diagnosis present

## 2022-01-13 DIAGNOSIS — D6959 Other secondary thrombocytopenia: Secondary | ICD-10-CM | POA: Insufficient documentation

## 2022-01-13 LAB — CBC WITH DIFFERENTIAL/PLATELET
Abs Immature Granulocytes: 0.02 10*3/uL (ref 0.00–0.07)
Basophils Absolute: 0 10*3/uL (ref 0.0–0.1)
Basophils Relative: 1 %
Eosinophils Absolute: 0.1 10*3/uL (ref 0.0–0.5)
Eosinophils Relative: 2 %
HCT: 39.6 % (ref 39.0–52.0)
Hemoglobin: 14 g/dL (ref 13.0–17.0)
Immature Granulocytes: 0 %
Lymphocytes Relative: 13 %
Lymphs Abs: 0.6 10*3/uL — ABNORMAL LOW (ref 0.7–4.0)
MCH: 30.7 pg (ref 26.0–34.0)
MCHC: 35.4 g/dL (ref 30.0–36.0)
MCV: 86.8 fL (ref 80.0–100.0)
Monocytes Absolute: 0.6 10*3/uL (ref 0.1–1.0)
Monocytes Relative: 12 %
Neutro Abs: 3.5 10*3/uL (ref 1.7–7.7)
Neutrophils Relative %: 72 %
Platelets: 120 10*3/uL — ABNORMAL LOW (ref 150–400)
RBC: 4.56 MIL/uL (ref 4.22–5.81)
RDW: 13.6 % (ref 11.5–15.5)
WBC: 4.8 10*3/uL (ref 4.0–10.5)
nRBC: 0 % (ref 0.0–0.2)

## 2022-01-13 LAB — COMPREHENSIVE METABOLIC PANEL
ALT: 21 U/L (ref 0–44)
AST: 19 U/L (ref 15–41)
Albumin: 4.2 g/dL (ref 3.5–5.0)
Alkaline Phosphatase: 50 U/L (ref 38–126)
Anion gap: 8 (ref 5–15)
BUN: 18 mg/dL (ref 8–23)
CO2: 24 mmol/L (ref 22–32)
Calcium: 9.4 mg/dL (ref 8.9–10.3)
Chloride: 108 mmol/L (ref 98–111)
Creatinine, Ser: 1.03 mg/dL (ref 0.61–1.24)
GFR, Estimated: >60 mL/min (ref >60)
Glucose, Bld: 94 mg/dL (ref 70–99)
Potassium: 4 mmol/L (ref 3.5–5.1)
Sodium: 140 mmol/L (ref 135–145)
Total Bilirubin: 0.6 mg/dL (ref 0.3–1.2)
Total Protein: 6.6 g/dL (ref 6.5–8.1)

## 2022-01-13 LAB — LACTATE DEHYDROGENASE: LDH: 138 U/L (ref 98–192)

## 2022-01-13 NOTE — Progress Notes (Signed)
John [name]   Telephone:(336) [phone] Fax:(336) [phone]   Clinic Follow up Note   Patient Care Team: John Sacramento, MD as PCP - General (Family Medicine)  Date of Service:  01/13/2022  CHIEF COMPLAINT: f/u of diffuse large B cell lymphoma  CURRENT THERAPY:  Surveillance  ASSESSMENT & PLAN:  John [name] is a 76 y.o. male with   1. Diffuse large B cell lymphoma, ABC Subtype, stage IV -After worsening fatigue pt presented to ED. 04/20/20 CT CAP showed large mediastinal mass and diffuse liver masses, and diffuse retroperitoneal lymphadenopathy. 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 Given his liver metastasis and LN involvement, this is stage IV. His lymphoma FISH panel is negative for double hit lymphoma -He completed first-line Inpatient R-EPOCH q3weeks for 6 cycles on 05/01/20-08/27/20. Given his high risk for CNS involvement, treatment involved intrathecal treatment with methotrexate prophylactically from C2. He is currently on surveillance/observation. -most recent CT CAP 10/07/21 showed decreased size of splenic lesions, and stable liver lesion which is likely the hematoma from previous of liver biopsy.  -He continues to recover from treatment. Labs reviewed, lymph abs and platelets remain slightly low but improved since completing treatment. Physical exam was unremarkable. No clinical concern for recurrence. -plan to repeat one more CT in 3 months    2. Thrombocytopenia, secondary to chemo -He was given GCSF after Cycle 1 due to significant pancytopenia with neutropenic fever. He was treated as inpatient and required blood and platelet transfusion.  -He last required blood transfusion on 09/04/20.  -S/p treatment, his labs improved overall. His plt have slowly improved, 120k today (01/13/22)      PLAN:  -Lab reviewed, he is clinically doing well  -f/u in 3 months, with lab and CT several days  before   No problem-specific Assessment & Plan notes found for this encounter.   SUMMARY OF ONCOLOGIC HISTORY: Oncology History Overview Note  Cancer Staging Diffuse large B cell lymphoma ([place]) Staging form: Hodgkin and Non-Hodgkin Lymphoma, AJCC 8th Edition - Clinical stage from 04/23/2020: Stage IV (Diffuse large B-cell lymphoma) - Signed by Truitt Merle, MD on 04/29/2020 Stage prefix: Initial diagnosis    Diffuse large B cell lymphoma ([place])  04/20/2020 Imaging   CT CAP  IMPRESSION: 1. Large anterior mediastinal/prevascular space mass/adenopathy. The mass extends superiorly and abuts the upper trachea. 2. Large hypodense lesion in the right lobe of the liver as well as ill-defined splenic hypodense lesions. Further characterization with MRI without and with contrast recommended. The liver lesion is amenable to percutaneous tissue sampling. 3. Extensive mesenteric and retroperitoneal adenopathy. 4. Sigmoid diverticulosis. No bowel obstruction. Normal appendix. 5. Aortic Atherosclerosis (ICD10-I70.0).   04/23/2020 Cancer Staging   Staging form: Hodgkin and Non-Hodgkin Lymphoma, AJCC 8th Edition - Clinical stage from 04/23/2020: Stage IV (Diffuse large B-cell lymphoma) - Signed by Truitt Merle, MD on 04/29/2020   04/23/2020 Initial Biopsy   FINAL MICROSCOPIC DIAGNOSIS:   A. LIVER, RIGHT MASS, NEEDLE CORE BIOPSY:  - Large B-cell lymphoma  - See comment     COMMENT:   The sections show needle core biopsy fragments displaying effacement of  the architecture by a dense infiltrate of primarily large atypical  lymphoid appearing cells displaying vesicular chromatin and small  nucleoli.  This is associated with apoptosis and brisk mitosis.  The  appearance is diffuse with lack of atypical follicles.  A large battery  of immunohistochemical stains was performed and shows  that the atypical  lymphoid cells are positive for LCA, CD20, PAX 5, BCL-2, BCL6, CD5,  MUM-1 (partial).  The  atypical lymphoid cells are negative for CD10,  CD30, CD34, CD138, cyclin D1, TdT, and EBV in situ hybridization,  synaptophysin, cytokeratin AE1/AE3, cytokeratin 20, cytokeratin 7,  cytokeratin 5/6, TTF-1, CD56, CDX2, and p40.  There is an admixed  relatively minor population of T-cells as primarily seen with CD3.  The  overall features are consistent with large B-cell lymphoma, ABC subtype.  The lymphomatous process displays expression of CD5.  This phenotype is  seen in 5-10% of mostly de novo large B-cell lymphoma cases or rarely as  a transformation of a low-grade B-cell lymphoma such as small  lymphocytic lymphoma.  Clinical correlation is recommended.    04/25/2020 Imaging   CT AP  IMPRESSION: 1. Development of high-density ascites in the abdomen and pelvis. Findings are most compatible with a small to moderate amount of hemoperitoneum and related to the recent liver biopsy. 2. Extensive lymphadenopathy throughout the abdomen and pelvis with a large mass or nodal mass near the pancreatic head. There are additional lesions involving the liver and spleen. Findings are suggestive for metastatic disease. 3. Development of small bilateral pleural effusions.   These results were called by telephone at the time of interpretation on 04/25/2020 at 1:43 pm to provider John [place] , who verbally acknowledged these results.   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 Completed      07/04/2020 PET scan   IMPRESSION: 1. Marked reduction in volume of anterior mediastinal mass now with low metabolic activity ( Deauville 2). 2. Reduction in volume of retroperitoneal nodal mass adjacent the pancreas with low metabolic activity ( Deauville 3). 3. No increased metabolic activity associated with the spleen 4.  Interval contraction of hepatic hematoma post biopsy. No hypermetabolic elements within the hepatic lesion. 5. Grade 2 anterolisthesis L4 on L5.     10/03/2020 Imaging   PET IMPRESSION: 1. No evidence of residual or recurrent lymphoma. 2. Heterogeneous bilobed mass in the liver, unchanged and reportedly due to chronic post biopsy hematomas. 3. Enlarged prostate.  Associated bladder wall thickening. 4. Aortic atherosclerosis (ICD10-I70.0). Coronary artery calcification.     04/09/2021 Imaging   CT CAP  IMPRESSION: 1. Decreased size of the hypodense splenic lesions. No splenomegaly or adenopathy in the chest abdomen or pelvis. 2. Well-circumscribed hypodense 5.7 cm segment IVa/VIII hepatic lesion which demonstrates a tubular hyperdense area of enhancement which extends laterally to a well-circumscribed 3.7 cm hyperdense lesion. 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 Recommend further evaluation with multiphase CT of the liver with without contrast. 3. Left-sided colonic diverticulosis without findings of acute diverticulitis. 4. Prostatomegaly. 5. Aortic Atherosclerosis (ICD10-I70.0).      INTERVAL HISTORY:  John [name] is here for a follow up of lymphoma. He was last seen by me on 10/10/21. He presents to the clinic accompanied by his wife. He reports he is doing well overall. He notes his energy is improving and adds he is able to do everything he wants to do. He denies fever, chills, night sweats, or any other concerns. He notes occasional cough, nasal discharge; he explains it's mild and not daily.   All other systems were reviewed with the patient  and are negative.  MEDICAL HISTORY:  Past Medical History:  Diagnosis Date   diffuse large b cell lymphoma 04/2020   Goals of care, counseling/discussion 05/06/2020   Hypertension    Knee pain, right     SURGICAL  HISTORY: Past Surgical History:  Procedure Laterality Date   FOOT SURGERY     IR IMAGING GUIDED PORT INSERTION  05/28/2020   IR REMOVAL TUN ACCESS W/ PORT W/O FL MOD SED  02/07/2021   TONSILLECTOMY      I have reviewed the social history and family history with the patient and they are unchanged from previous note.  ALLERGIES:  has No Known Allergies.  MEDICATIONS:  Current Outpatient Medications  Medication Sig Dispense Refill   Ascorbic Acid (VITAMIN C WITH ROSE HIPS) 1000 MG tablet Take 1,000 mg by mouth daily.     Calcium Citrate-Vitamin D (CALCIUM CITRATE + D3) 200-6.25 MG-MCG TABS Take 2 tablets by mouth daily. Calcium Citrate/ D3 ('400mg'$ /500IU) 1 daily     doxazosin (CARDURA) 4 MG tablet Take 4 mg by mouth daily.     metoprolol tartrate (LOPRESSOR) 25 MG tablet Take 0.5 tablets (12.5 mg total) by mouth 2 (two) times daily. (Patient taking differently: Take 12.5 mg by mouth in the morning. Take 0.5 tablets (12.5 mg total) by mouth 1 time daily) 30 tablet 0   Misc Natural Products (PROSTATE SUPPORT PO) Take 2 capsules by mouth daily. Prostate Support take 2 times daily     Multiple Vitamins-Minerals (MULTIVITAMIN PO) Take 1 tablet by mouth daily.     Omega-3 Fatty Acids (OMEGA 3 PO) Take 1 tablet by mouth daily.     No current facility-administered medications for this visit.   Facility-Administered Medications Ordered in Other Visits  Medication Dose Route Frequency Provider Last Rate Last Admin   heparin lock flush 100 unit/mL  500 Units Intracatheter PRN Truitt Merle, MD       sodium chloride flush (NS) 0.9 % injection 10 mL  10 mL Intracatheter PRN Truitt Merle, MD        PHYSICAL EXAMINATION: ECOG PERFORMANCE STATUS: 0 - Asymptomatic  Vitals:   01/13/22 1246  BP: 118/61  Pulse: 66  Resp: 18  Temp: 98.4 F (36.9 C)  SpO2: 97%   Wt Readings from Last 3 Encounters:  01/13/22 224 lb 8 oz (101.8 kg)  10/10/21 227 lb 8 oz (103.2 kg)  07/15/21 232 lb 9.6 oz (105.5 kg)      GENERAL:alert, no distress and comfortable SKIN: skin color, texture, turgor are normal, no rashes or significant lesions EYES: normal, Conjunctiva are pink and non-injected, sclera clear  NECK: supple, thyroid normal size, non-tender, without nodularity LYMPH:  no palpable lymphadenopathy in the cervical, axillary  LUNGS: clear to auscultation and percussion with normal breathing effort HEART: regular rate & rhythm and no murmurs and no lower extremity edema ABDOMEN:abdomen soft, non-tender and normal bowel sounds Musculoskeletal:no cyanosis of digits and no clubbing  NEURO: alert & oriented x 3 with fluent speech, no focal motor/sensory deficits  LABORATORY DATA:  I have reviewed the data as listed    Latest Ref Rng & Units 01/13/2022   12:16 PM 10/07/2021    7:32 AM 08/05/2021    7:42 AM  CBC  WBC 4.0 - 10.5 K/uL 4.8  4.3  4.7   Hemoglobin 13.0 - 17.0 g/dL 14.0  13.9  13.7   Hematocrit 39.0 - 52.0 % 39.6  41.0  39.3   Platelets 150 - 400 K/uL  120  100  98         Latest Ref Rng & Units 01/13/2022   12:16 PM 10/07/2021    7:32 AM 08/05/2021    7:42 AM  CMP  Glucose 70 - 99 mg/dL 94  106  116   BUN 8 - 23 mg/dL '18  19  22   '$ Creatinine 0.61 - 1.24 mg/dL 1.03  0.96  0.96   Sodium 135 - 145 mmol/L 140  140  140   Potassium 3.5 - 5.1 mmol/L 4.0  4.0  4.0   Chloride 98 - 111 mmol/L 108  106  107   CO2 22 - 32 mmol/L '24  27  27   '$ Calcium 8.9 - 10.3 mg/dL 9.4  9.6  9.5   Total Protein 6.5 - 8.1 g/dL 6.6  6.5  6.5   Total Bilirubin 0.3 - 1.2 mg/dL 0.6  0.7  0.6   Alkaline Phos 38 - 126 U/L 50  55  51   AST 15 - 41 U/L '19  17  17   '$ ALT 0 - 44 U/L '21  20  18       '$ RADIOGRAPHIC STUDIES: I have personally reviewed the radiological images as listed and agreed with the findings in the report. No results found.    Orders Placed This Encounter  Procedures   CT CHEST ABDOMEN PELVIS W CONTRAST    Standing Status:   Future    Standing Expiration Date:   01/14/2023    Order Specific  Question:   Preferred imaging location?    Answer:   [facility]    Order Specific Question:   Is Oral Contrast requested for this exam?    Answer:   Yes, Per Radiology protocol   All questions were answered. The patient knows to call the clinic with any problems, questions or concerns. No barriers to learning was detected. The total time spent in the appointment was 30 minutes.     Truitt Merle, MD 01/13/2022   I, Wilburn Mylar, am acting as scribe for Truitt Merle, MD.   I have reviewed the above documentation for accuracy and completeness, and I agree with the above.

## 2022-04-21 ENCOUNTER — Inpatient Hospital Stay: Payer: Medicare HMO | Attending: Hematology

## 2022-04-21 ENCOUNTER — Ambulatory Visit (HOSPITAL_COMMUNITY)
Admission: RE | Admit: 2022-04-21 | Discharge: 2022-04-21 | Disposition: A | Discharge status: Home / Self Care | Payer: Medicare HMO | Source: Ambulatory Visit | Attending: Hematology | Admitting: Hematology

## 2022-04-21 ENCOUNTER — Encounter (HOSPITAL_COMMUNITY): Payer: Self-pay

## 2022-04-21 DIAGNOSIS — C8338 Diffuse large B-cell lymphoma, lymph nodes of multiple sites: Secondary | ICD-10-CM | POA: Insufficient documentation

## 2022-04-21 DIAGNOSIS — C8339 Diffuse large B-cell lymphoma, extranodal and solid organ sites: Secondary | ICD-10-CM | POA: Insufficient documentation

## 2022-04-21 DIAGNOSIS — D6959 Other secondary thrombocytopenia: Secondary | ICD-10-CM | POA: Insufficient documentation

## 2022-04-21 LAB — CBC WITH DIFFERENTIAL/PLATELET
Abs Immature Granulocytes: 0.05 10*3/uL (ref 0.00–0.07)
Basophils Absolute: 0.1 10*3/uL (ref 0.0–0.1)
Basophils Relative: 1 %
Eosinophils Absolute: 0.1 10*3/uL (ref 0.0–0.5)
Eosinophils Relative: 2 %
HCT: 42.4 % (ref 39.0–52.0)
Hemoglobin: 14.4 g/dL (ref 13.0–17.0)
Immature Granulocytes: 1 %
Lymphocytes Relative: 11 %
Lymphs Abs: 0.6 10*3/uL — ABNORMAL LOW (ref 0.7–4.0)
MCH: 29.8 pg (ref 26.0–34.0)
MCHC: 34 g/dL (ref 30.0–36.0)
MCV: 87.6 fL (ref 80.0–100.0)
Monocytes Absolute: 0.5 10*3/uL (ref 0.1–1.0)
Monocytes Relative: 10 %
Neutro Abs: 4.2 10*3/uL (ref 1.7–7.7)
Neutrophils Relative %: 75 %
Platelets: 140 10*3/uL — ABNORMAL LOW (ref 150–400)
RBC: 4.84 MIL/uL (ref 4.22–5.81)
RDW: 13.4 % (ref 11.5–15.5)
WBC: 5.5 10*3/uL (ref 4.0–10.5)
nRBC: 0 % (ref 0.0–0.2)

## 2022-04-21 LAB — COMPREHENSIVE METABOLIC PANEL
ALT: 20 U/L (ref 0–44)
AST: 17 U/L (ref 15–41)
Albumin: 4.4 g/dL (ref 3.5–5.0)
Alkaline Phosphatase: 53 U/L (ref 38–126)
Anion gap: 6 (ref 5–15)
BUN: 20 mg/dL (ref 8–23)
CO2: 28 mmol/L (ref 22–32)
Calcium: 9.6 mg/dL (ref 8.9–10.3)
Chloride: 104 mmol/L (ref 98–111)
Creatinine, Ser: 0.97 mg/dL (ref 0.61–1.24)
GFR, Estimated: >60 mL/min (ref >60)
Glucose, Bld: 110 mg/dL — ABNORMAL HIGH (ref 70–99)
Potassium: 4.3 mmol/L (ref 3.5–5.1)
Sodium: 138 mmol/L (ref 135–145)
Total Bilirubin: 0.7 mg/dL (ref 0.3–1.2)
Total Protein: 7.1 g/dL (ref 6.5–8.1)

## 2022-04-21 LAB — LACTATE DEHYDROGENASE: LDH: 147 U/L (ref 98–192)

## 2022-04-21 MED ORDER — IOHEXOL 300 MG/ML  SOLN
100.0000 mL | Freq: Once | INTRAMUSCULAR | Status: AC | PRN
  Administered 2022-04-21: 100 mL via INTRAVENOUS

## 2022-04-21 MED ORDER — SODIUM CHLORIDE (PF) 0.9 % IJ SOLN
INTRAMUSCULAR | Status: AC
  Filled 2022-04-21: qty 50

## 2022-04-24 ENCOUNTER — Inpatient Hospital Stay (HOSPITAL_BASED_OUTPATIENT_CLINIC_OR_DEPARTMENT_OTHER): Payer: Medicare HMO | Admitting: Hematology

## 2022-04-24 ENCOUNTER — Encounter: Payer: Self-pay | Admitting: Hematology

## 2022-04-24 VITALS — BP 127/63 | HR 81 | Temp 98.0°F | Resp 19 | Ht 71.0 in | Wt 224.8 lb

## 2022-04-24 DIAGNOSIS — C8338 Diffuse large B-cell lymphoma, lymph nodes of multiple sites: Secondary | ICD-10-CM | POA: Diagnosis not present

## 2022-04-24 DIAGNOSIS — D6959 Other secondary thrombocytopenia: Secondary | ICD-10-CM | POA: Diagnosis not present

## 2022-04-24 DIAGNOSIS — C8339 Diffuse large B-cell lymphoma, extranodal and solid organ sites: Secondary | ICD-10-CM | POA: Diagnosis not present

## 2022-04-24 NOTE — Progress Notes (Signed)
[place]   [phone] Fax:([phone]   Clinic Follow up Note   Patient Care Team: Christain Sacramento, MD as PCP - General (Family Medicine)  Date of Service:  04/24/2022  CHIEF COMPLAINT: f/u of diffuse large B cell lymphoma   CURRENT THERAPY:  Surveillance  ASSESSMENT & PLAN:  John [name] is a 76 y.o. male with   1. Diffuse large B cell lymphoma, ABC Subtype, stage IV -After worsening fatigue pt presented to ED. 04/20/20 CT CAP showed large mediastinal mass and diffuse liver masses, and diffuse retroperitoneal lymphadenopathy. 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 Given his liver metastasis and LN involvement, this is stage IV. His lymphoma FISH panel is negative for double hit lymphoma -He completed first-line Inpatient R-EPOCH q3weeks for 6 cycles on 05/01/20-08/27/20. Given his high risk for CNS involvement, treatment involved intrathecal treatment with methotrexate prophylactically from C2. He is currently on surveillance/observation. -most recent CT CAP 04/21/22 was stable, no adenopathy or splenomegaly. I personally reviewed the scan images and discussed results with them today. -he is now 2 years out from diagnosis. We will continue to follow him for 5 years. I do not plan to repeat surveillance scan unless he develops symptoms. Labs from 11/13 reviewed, platelets are improving, otherwise WNL.   2. Thrombocytopenia, secondary to chemo -He was given GCSF after Cycle 1 due to significant pancytopenia with neutropenic fever. He was treated as inpatient and required blood and platelet transfusion.  -He last required blood transfusion on 09/04/20.  -continuing to improve off chemo, 140k on 04/21/22      PLAN:  -Lab and CT scan reviewed, NED, will continue surveillance -lab and f/u in 6 months   No problem-specific Assessment & Plan notes found for this  encounter.   SUMMARY OF ONCOLOGIC HISTORY: Oncology History Overview Note  Cancer Staging Diffuse large B cell lymphoma ([place]) Staging form: Hodgkin and Non-Hodgkin Lymphoma, AJCC 8th Edition - Clinical stage from 04/23/2020: Stage IV (Diffuse large B-cell lymphoma) - Signed by Truitt Merle, MD on 04/29/2020 Stage prefix: Initial diagnosis    Diffuse large B cell lymphoma ([place])  04/20/2020 Imaging   CT CAP  IMPRESSION: 1. Large anterior mediastinal/prevascular space mass/adenopathy. The mass extends superiorly and abuts the upper trachea. 2. Large hypodense lesion in the right lobe of the liver as well as ill-defined splenic hypodense lesions. Further characterization with MRI without and with contrast recommended. The liver lesion is amenable to percutaneous tissue sampling. 3. Extensive mesenteric and retroperitoneal adenopathy. 4. Sigmoid diverticulosis. No bowel obstruction. Normal appendix. 5. Aortic Atherosclerosis (ICD10-I70.0).   04/23/2020 Cancer Staging   Staging form: Hodgkin and Non-Hodgkin Lymphoma, AJCC 8th Edition - Clinical stage from 04/23/2020: Stage IV (Diffuse large B-cell lymphoma) - Signed by Truitt Merle, MD on 04/29/2020   04/23/2020 Initial Biopsy   FINAL MICROSCOPIC DIAGNOSIS:   A. LIVER, RIGHT MASS, NEEDLE CORE BIOPSY:  - Large B-cell lymphoma  - See comment     COMMENT:   The sections show needle core biopsy fragments displaying effacement of  the architecture by a dense infiltrate of primarily large atypical  lymphoid appearing cells displaying vesicular chromatin and small  nucleoli.  This is associated with apoptosis and brisk mitosis.  The  appearance is diffuse with lack of atypical follicles.  A large battery  of immunohistochemical stains was performed and shows that the atypical  lymphoid cells are positive for LCA, CD20, PAX 5,  BCL-2, BCL6, CD5,  MUM-1 (partial).  The atypical lymphoid cells are negative for CD10,  CD30, CD34, CD138,  cyclin D1, TdT, and EBV in situ hybridization,  synaptophysin, cytokeratin AE1/AE3, cytokeratin 20, cytokeratin 7,  cytokeratin 5/6, TTF-1, CD56, CDX2, and p40.  There is an admixed  relatively minor population of T-cells as primarily seen with CD3.  The  overall features are consistent with large B-cell lymphoma, ABC subtype.  The lymphomatous process displays expression of CD5.  This phenotype is  seen in 5-10% of mostly de novo large B-cell lymphoma cases or rarely as  a transformation of a low-grade B-cell lymphoma such as small  lymphocytic lymphoma.  Clinical correlation is recommended.    04/25/2020 Imaging   CT AP  IMPRESSION: 1. Development of high-density ascites in the abdomen and pelvis. Findings are most compatible with a small to moderate amount of hemoperitoneum and related to the recent liver biopsy. 2. Extensive lymphadenopathy throughout the abdomen and pelvis with a large mass or nodal mass near the pancreatic head. There are additional lesions involving the liver and spleen. Findings are suggestive for metastatic disease. 3. Development of small bilateral pleural effusions.   These results were called by telephone at the time of interpretation on 04/25/2020 at 1:43 pm to provider ERIC [place] who verbally acknowledged these results.   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 Completed      07/04/2020 PET scan   IMPRESSION: 1. Marked reduction in volume of anterior mediastinal mass now with low metabolic activity ( Deauville 2). 2. Reduction in volume of retroperitoneal nodal mass adjacent the pancreas with low metabolic activity ( Deauville 3). 3. No increased metabolic activity associated with the spleen 4. Interval contraction of hepatic hematoma post biopsy.  No hypermetabolic elements within the hepatic lesion. 5. Grade 2 anterolisthesis L4 on L5.     10/03/2020 Imaging   PET IMPRESSION: 1. No evidence of residual or recurrent lymphoma. 2. Heterogeneous bilobed mass in the liver, unchanged and reportedly due to chronic post biopsy hematomas. 3. Enlarged prostate.  Associated bladder wall thickening. 4. Aortic atherosclerosis (ICD10-I70.0). Coronary artery calcification.     04/09/2021 Imaging   CT CAP  IMPRESSION: 1. Decreased size of the hypodense splenic lesions. No splenomegaly or adenopathy in the chest abdomen or pelvis. 2. Well-circumscribed hypodense 5.7 cm segment IVa/VIII hepatic lesion which demonstrates a tubular hyperdense area of enhancement which extends laterally to a well-circumscribed 3.7 cm hyperdense lesion. 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 Recommend further evaluation with multiphase CT of the liver with without contrast. 3. Left-sided colonic diverticulosis without findings of acute diverticulitis. 4. Prostatomegaly. 5. Aortic Atherosclerosis (ICD10-I70.0).      INTERVAL HISTORY:  John [name] is here for a follow up of lymphoma. He was last seen by me on 01/13/22. He presents to the clinic accompanied by his wife. He reports he is doing well overall. He tells me they had Covid since his last visit. He notes he has recovered well with no residual side effects. He denies night sweats.   All other systems were reviewed with the patient and are negative.  MEDICAL HISTORY:  Past Medical History:  Diagnosis Date   diffuse large b cell lymphoma 04/2020   Goals of care, counseling/discussion 05/06/2020  Hypertension    Knee pain, right     SURGICAL HISTORY: Past Surgical History:  Procedure Laterality Date   FOOT SURGERY     IR IMAGING GUIDED PORT INSERTION  05/28/2020   IR REMOVAL TUN ACCESS W/  PORT W/O FL MOD SED  02/07/2021   TONSILLECTOMY      I have reviewed the social history and family history with the patient and they are unchanged from previous note.  ALLERGIES:  has No Known Allergies.  MEDICATIONS:  Current Outpatient Medications  Medication Sig Dispense Refill   Ascorbic Acid (VITAMIN C WITH ROSE HIPS) 1000 MG tablet Take 1,000 mg by mouth daily.     Calcium Citrate-Vitamin D (CALCIUM CITRATE + D3) 200-6.25 MG-MCG TABS Take 2 tablets by mouth daily. Calcium Citrate/ D3 ('400mg'$ /500IU) 1 daily     doxazosin (CARDURA) 4 MG tablet Take 4 mg by mouth daily.     metoprolol tartrate (LOPRESSOR) 25 MG tablet Take 0.5 tablets (12.5 mg total) by mouth 2 (two) times daily. (Patient taking differently: Take 12.5 mg by mouth in the morning. Take 0.5 tablets (12.5 mg total) by mouth 1 time daily) 30 tablet 0   Misc Natural Products (PROSTATE SUPPORT PO) Take 2 capsules by mouth daily. Prostate Support take 2 times daily     Multiple Vitamins-Minerals (MULTIVITAMIN PO) Take 1 tablet by mouth daily.     Omega-3 Fatty Acids (OMEGA 3 PO) Take 1 tablet by mouth daily.     No current facility-administered medications for this visit.   Facility-Administered Medications Ordered in Other Visits  Medication Dose Route Frequency Provider Last Rate Last Admin   heparin lock flush 100 unit/mL  500 Units Intracatheter PRN Truitt Merle, MD       sodium chloride flush (NS) 0.9 % injection 10 mL  10 mL Intracatheter PRN Truitt Merle, MD        PHYSICAL EXAMINATION: ECOG PERFORMANCE STATUS: 0 - Asymptomatic  Vitals:   04/24/22 0854  BP: 127/63  Pulse: 81  Resp: 19  Temp: 98 F (36.7 C)  SpO2: 96%   Wt Readings from Last 3 Encounters:  04/24/22 224 lb 12.8 oz (102 kg)  01/13/22 224 lb 8 oz (101.8 kg)  10/10/21 227 lb 8 oz (103.2 kg)     GENERAL:alert, no distress and comfortable SKIN: skin color normal, no rashes or significant lesions EYES: normal, Conjunctiva are pink and non-injected,  sclera clear  NEURO: alert & oriented x 3 with fluent speech  LABORATORY DATA:  I have reviewed the data as listed    Latest Ref Rng & Units 04/21/2022    9:09 AM 01/13/2022   12:16 PM 10/07/2021    7:32 AM  CBC  WBC 4.0 - 10.5 K/uL 5.5  4.8  4.3   Hemoglobin 13.0 - 17.0 g/dL 14.4  14.0  13.9   Hematocrit 39.0 - 52.0 % 42.4  39.6  41.0   Platelets 150 - 400 K/uL 140  120  100         Latest Ref Rng & Units 04/21/2022    9:09 AM 01/13/2022   12:16 PM 10/07/2021    7:32 AM  CMP  Glucose 70 - 99 mg/dL 110  94  106   BUN 8 - 23 mg/dL '20  18  19   '$ Creatinine 0.61 - 1.24 mg/dL 0.97  1.03  0.96   Sodium 135 - 145 mmol/L 138  140  140   Potassium 3.5 - 5.1 mmol/L 4.3  4.0  4.0   Chloride 98 - 111 mmol/L 104  108  106   CO2 22 - 32 mmol/L '28  24  27   '$ Calcium 8.9 - 10.3 mg/dL 9.6  9.4  9.6   Total Protein 6.5 - 8.1 g/dL 7.1  6.6  6.5   Total Bilirubin 0.3 - 1.2 mg/dL 0.7  0.6  0.7   Alkaline Phos 38 - 126 U/L 53  50  55   AST 15 - 41 U/L '17  19  17   '$ ALT 0 - 44 U/L '20  21  20       '$ RADIOGRAPHIC STUDIES: I have personally reviewed the radiological images as listed and agreed with the findings in the report. No results found.    No orders of the defined types were placed in this encounter.  All questions were answered. The patient knows to call the clinic with any problems, questions or concerns. No barriers to learning was detected.      Truitt Merle, MD 04/24/2022   I, Wilburn Mylar, am acting as scribe for Truitt Merle, MD.   I have reviewed the above documentation for accuracy and completeness, and I agree with the above.

## 2022-04-29 ENCOUNTER — Encounter: Payer: Self-pay | Admitting: Hematology

## 2022-10-16 ENCOUNTER — Encounter: Payer: Self-pay | Admitting: Hematology

## 2022-10-22 ENCOUNTER — Other Ambulatory Visit: Payer: Self-pay

## 2022-10-22 ENCOUNTER — Encounter: Payer: Self-pay | Admitting: Hematology

## 2022-10-22 DIAGNOSIS — C8338 Diffuse large B-cell lymphoma, lymph nodes of multiple sites: Secondary | ICD-10-CM

## 2022-10-23 ENCOUNTER — Encounter: Payer: Self-pay | Admitting: Hematology

## 2022-10-23 ENCOUNTER — Inpatient Hospital Stay (HOSPITAL_BASED_OUTPATIENT_CLINIC_OR_DEPARTMENT_OTHER): Payer: HMO | Admitting: Hematology

## 2022-10-23 ENCOUNTER — Inpatient Hospital Stay: Payer: HMO | Attending: Hematology

## 2022-10-23 VITALS — BP 134/67 | HR 67 | Temp 98.2°F | Resp 18 | Ht 71.0 in | Wt 226.1 lb

## 2022-10-23 DIAGNOSIS — C8338 Diffuse large B-cell lymphoma, lymph nodes of multiple sites: Secondary | ICD-10-CM

## 2022-10-23 DIAGNOSIS — C8339 Diffuse large B-cell lymphoma, extranodal and solid organ sites: Secondary | ICD-10-CM

## 2022-10-23 LAB — CBC WITH DIFFERENTIAL (CANCER CENTER ONLY)
Abs Immature Granulocytes: 0.03 10*3/uL (ref 0.00–0.07)
Basophils Absolute: 0 10*3/uL (ref 0.0–0.1)
Basophils Relative: 1 %
Eosinophils Absolute: 0.1 10*3/uL (ref 0.0–0.5)
Eosinophils Relative: 2 %
HCT: 42.2 % (ref 39.0–52.0)
Hemoglobin: 14.3 g/dL (ref 13.0–17.0)
Immature Granulocytes: 1 %
Lymphocytes Relative: 11 %
Lymphs Abs: 0.6 10*3/uL — ABNORMAL LOW (ref 0.7–4.0)
MCH: 29.9 pg (ref 26.0–34.0)
MCHC: 33.9 g/dL (ref 30.0–36.0)
MCV: 88.3 fL (ref 80.0–100.0)
Monocytes Absolute: 0.5 10*3/uL (ref 0.1–1.0)
Monocytes Relative: 9 %
Neutro Abs: 4.1 10*3/uL (ref 1.7–7.7)
Neutrophils Relative %: 76 %
Platelet Count: 119 10*3/uL — ABNORMAL LOW (ref 150–400)
RBC: 4.78 MIL/uL (ref 4.22–5.81)
RDW: 14.1 % (ref 11.5–15.5)
WBC Count: 5.3 10*3/uL (ref 4.0–10.5)
nRBC: 0 % (ref 0.0–0.2)

## 2022-10-23 LAB — CMP (CANCER CENTER ONLY)
ALT: 20 U/L (ref 0–44)
AST: 17 U/L (ref 15–41)
Albumin: 4.1 g/dL (ref 3.5–5.0)
Alkaline Phosphatase: 46 U/L (ref 38–126)
Anion gap: 6 (ref 5–15)
BUN: 16 mg/dL (ref 8–23)
CO2: 30 mmol/L (ref 22–32)
Calcium: 9.1 mg/dL (ref 8.9–10.3)
Chloride: 105 mmol/L (ref 98–111)
Creatinine: 1.01 mg/dL (ref 0.61–1.24)
GFR, Estimated: >60 mL/min (ref >60)
Glucose, Bld: 117 mg/dL — ABNORMAL HIGH (ref 70–99)
Potassium: 4.3 mmol/L (ref 3.5–5.1)
Sodium: 141 mmol/L (ref 135–145)
Total Bilirubin: 0.6 mg/dL (ref 0.3–1.2)
Total Protein: 6.4 g/dL — ABNORMAL LOW (ref 6.5–8.1)

## 2022-10-23 LAB — LACTATE DEHYDROGENASE: LDH: 138 U/L (ref 98–192)

## 2022-10-23 NOTE — Progress Notes (Signed)
John [name]   Telephone:([phone] Fax:(336) [phone]   Clinic Follow up Note   Patient Care Team: Barbie Banner, MD as PCP - General (Family Medicine)  Date of Service:  10/23/2022  CHIEF COMPLAINT: f/u of diffuse large B cell lymphoma     CURRENT THERAPY:  Surveillance   ASSESSMENT:  John [name] is a 77 y.o. male with   Diffuse large B cell lymphoma ([facility])  ABC Subtype, stage IV -After worsening fatigue pt presented to ED. 04/20/20 CT CAP showed large mediastinal mass and diffuse liver masses, and diffuse retroperitoneal lymphadenopathy. 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 Given his liver metastasis and LN involvement, this is stage IV. His lymphoma FISH panel is negative for double hit lymphoma -He completed first-line Inpatient R-EPOCH q3weeks for 6 cycles on 05/01/20-08/27/20. Given his high risk for CNS involvement, treatment involved intrathecal treatment with methotrexate prophylactically from C2. He is currently on surveillance/observation. -Surveillance CT CAP 04/21/22 was stable, no adenopathy or splenomegaly.  -He is clinically doing very well, asymptomatic, lab and exam unremarkable.  He is now 2.5 years out from diagnosis. We will continue to follow him for 5 years. I do not plan to repeat surveillance scan unless he develops symptoms    PLAN: -lab reviewed -LDH -pending -plan to see pt q6 months -lab and f/u in 6 months with NP Lacie  SUMMARY OF ONCOLOGIC HISTORY: Oncology History Overview Note  Cancer Staging Diffuse large B cell lymphoma ([facility]) Staging form: Hodgkin and Non-Hodgkin Lymphoma, AJCC 8th Edition - Clinical stage from 04/23/2020: Stage IV (Diffuse large B-cell lymphoma) - Signed by Malachy Mood, MD on 04/29/2020 Stage prefix: Initial diagnosis    Diffuse large B cell lymphoma ([facility])  04/20/2020 Imaging   CT CAP  IMPRESSION: 1. Large anterior  mediastinal/prevascular space mass/adenopathy. The mass extends superiorly and abuts the upper trachea. 2. Large hypodense lesion in the right lobe of the liver as well as ill-defined splenic hypodense lesions. Further characterization with MRI without and with contrast recommended. The liver lesion is amenable to percutaneous tissue sampling. 3. Extensive mesenteric and retroperitoneal adenopathy. 4. Sigmoid diverticulosis. No bowel obstruction. Normal appendix. 5. Aortic Atherosclerosis (ICD10-I70.0).   04/23/2020 Cancer Staging   Staging form: Hodgkin and Non-Hodgkin Lymphoma, AJCC 8th Edition - Clinical stage from 04/23/2020: Stage IV (Diffuse large B-cell lymphoma) - Signed by Malachy Mood, MD on 04/29/2020   04/23/2020 Initial Biopsy   FINAL MICROSCOPIC DIAGNOSIS:   A. LIVER, RIGHT MASS, NEEDLE CORE BIOPSY:  - Large B-cell lymphoma  - See comment     COMMENT:   The sections show needle core biopsy fragments displaying effacement of  the architecture by a dense infiltrate of primarily large atypical  lymphoid appearing cells displaying vesicular chromatin and small  nucleoli.  This is associated with apoptosis and brisk mitosis.  The  appearance is diffuse with lack of atypical follicles.  A large battery  of immunohistochemical stains was performed and shows that the atypical  lymphoid cells are positive for LCA, CD20, PAX 5, BCL-2, BCL6, CD5,  MUM-1 (partial).  The atypical lymphoid cells are negative for CD10,  CD30, CD34, CD138, cyclin D1, TdT, and EBV in situ hybridization,  synaptophysin, cytokeratin AE1/AE3, cytokeratin 20, cytokeratin 7,  cytokeratin 5/6, TTF-1, CD56, CDX2, and p40.  There is an admixed  relatively minor population of T-cells as primarily seen with CD3.  The  overall features are consistent with large  B-cell lymphoma, ABC subtype.  The lymphomatous process displays expression of CD5.  This phenotype is  seen in 5-10% of mostly de novo large B-cell  lymphoma cases or rarely as  a transformation of a low-grade B-cell lymphoma such as small  lymphocytic lymphoma.  Clinical correlation is recommended.    04/25/2020 Imaging   CT AP  IMPRESSION: 1. Development of high-density ascites in the abdomen and pelvis. Findings are most compatible with a small to moderate amount of hemoperitoneum and related to the recent liver biopsy. 2. Extensive lymphadenopathy throughout the abdomen and pelvis with a large mass or nodal mass near the pancreatic head. There are additional lesions involving the liver and spleen. Findings are suggestive for metastatic disease. 3. Development of small bilateral pleural effusions.   These results were called by telephone at the time of interpretation on 04/25/2020 at 1:43 pm to provider ERIC [place] , who verbally acknowledged these results.   04/26/2020 Initial Diagnosis   High grade non-Hodgkin lymphoma ([facility])   05/01/2020 - 08/27/2020 Chemotherapy   Inpatient R-EPOCH q3weeks for 6 cycles starting 05/01/20 with intrathecal prophylactic treatment starting with C2. Completed      07/04/2020 PET scan   IMPRESSION: 1. Marked reduction in volume of anterior mediastinal mass now with low metabolic activity ( Deauville 2). 2. Reduction in volume of retroperitoneal nodal mass adjacent the pancreas with low metabolic activity ( Deauville 3). 3. No increased metabolic activity associated with the spleen 4. Interval contraction of hepatic hematoma post biopsy. No hypermetabolic elements within the hepatic lesion. 5. Grade 2 anterolisthesis L4 on L5.     10/03/2020 Imaging   PET IMPRESSION: 1. No evidence of residual or recurrent lymphoma. 2. Heterogeneous bilobed mass in the liver, unchanged and reportedly due to chronic post biopsy hematomas. 3. Enlarged prostate.  Associated bladder wall thickening. 4. Aortic atherosclerosis (ICD10-I70.0). Coronary artery calcification.     04/09/2021 Imaging   CT  CAP  IMPRESSION: 1. Decreased size of the hypodense splenic lesions. No splenomegaly or adenopathy in the chest abdomen or pelvis. 2. Well-circumscribed hypodense 5.7 cm segment IVa/VIII hepatic lesion which demonstrates a tubular hyperdense area of enhancement which extends laterally to a well-circumscribed 3.7 cm hyperdense lesion. 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 Recommend further evaluation with multiphase CT of the liver with without contrast. 3. Left-sided colonic diverticulosis without findings of acute diverticulitis. 4. Prostatomegaly. 5. Aortic Atherosclerosis (ICD10-I70.0).      INTERVAL HISTORY:  John [name] is here for a follow up of diffuse large B cell lymphoma . He was last seen by me on 04/24/2022. He presents to the clinic accompanied by wife. Pt state that he has no residual problems from previous chemo. He also reports of having good appetite. Pt sate that him and is wife caught covid over the winter.    All other systems were reviewed with the patient and are negative.  MEDICAL HISTORY:  Past Medical History:  Diagnosis Date   diffuse large b cell lymphoma 04/2020   Goals of care, counseling/discussion 05/06/2020   Hypertension    Knee pain, right     SURGICAL HISTORY: Past Surgical History:  Procedure Laterality Date   FOOT SURGERY     IR IMAGING GUIDED PORT INSERTION  05/28/2020   IR REMOVAL TUN ACCESS W/ PORT W/O FL MOD SED  02/07/2021   TONSILLECTOMY      I have reviewed the social history and  family history with the patient and they are unchanged from previous note.  ALLERGIES:  has No Known Allergies.  MEDICATIONS:  Current Outpatient Medications  Medication Sig Dispense Refill   Ascorbic Acid (VITAMIN C WITH ROSE HIPS) 1000 MG tablet Take 1,000 mg by mouth daily.     Calcium Citrate-Vitamin D (CALCIUM CITRATE + D3) 200-6.25 MG-MCG  TABS Take 2 tablets by mouth daily. Calcium Citrate/ D3 (400mg Darcella Cheshire) 1 daily     doxazosin (CARDURA) 4 MG tablet Take 4 mg by mouth daily.     metoprolol tartrate (LOPRESSOR) 25 MG tablet Take 0.5 tablets (12.5 mg total) by mouth 2 (two) times daily. (Patient taking differently: Take 12.5 mg by mouth in the morning. Take 0.5 tablets (12.5 mg total) by mouth 1 time daily) 30 tablet 0   Misc Natural Products (PROSTATE SUPPORT PO) Take 2 capsules by mouth daily. Prostate Support take 2 times daily     Multiple Vitamins-Minerals (MULTIVITAMIN PO) Take 1 tablet by mouth daily.     Omega-3 Fatty Acids (OMEGA 3 PO) Take 1 tablet by mouth daily.     No current facility-administered medications for this visit.   Facility-Administered Medications Ordered in Other Visits  Medication Dose Route Frequency Provider Last Rate Last Admin   heparin lock flush 100 unit/mL  500 Units Intracatheter PRN Malachy Mood, MD       sodium chloride flush (NS) 0.9 % injection 10 mL  10 mL Intracatheter PRN Malachy Mood, MD        PHYSICAL EXAMINATION: ECOG PERFORMANCE STATUS: 0 - Asymptomatic  Vitals:   10/23/22 1048  BP: 134/67  Pulse: 67  Resp: 18  Temp: 98.2 F (36.8 C)  SpO2: 96%   Wt Readings from Last 3 Encounters:  10/23/22 226 lb 1.6 oz (102.6 kg)  04/24/22 224 lb 12.8 oz (102 kg)  01/13/22 224 lb 8 oz (101.8 kg)     GENERAL:alert, no distress and comfortable SKIN: skin color normal, no rashes or significant lesions EYES: normal, Conjunctiva are pink and non-injected, sclera clear  NEURO: alert & oriented x 3 with fluent speech NECK: (-) supple, thyroid normal size, non-tender, without nodularity LYMPH:(-)  no palpable lymphadenopathy in the cervical, axillary  LUNGS: (-)clear to auscultation and percussion with normal breathing effort HEART:(-) regular rate & rhythm and no murmurs and no lower extremity edema ABDOMEN(-)abdomen soft, (-)non-tender and(-) normal bowel sounds  LABORATORY DATA:  I  have reviewed the data as listed    Latest Ref Rng & Units 10/23/2022   10:11 AM 04/21/2022    9:09 AM 01/13/2022   12:16 PM  CBC  WBC 4.0 - 10.5 K/uL 5.3  5.5  4.8   Hemoglobin 13.0 - 17.0 g/dL 40.9  81.1  91.4   Hematocrit 39.0 - 52.0 % 42.2  42.4  39.6   Platelets 150 - 400 K/uL 119  140  120         Latest Ref Rng & Units 10/23/2022   10:11 AM 04/21/2022    9:09 AM 01/13/2022   12:16 PM  CMP  Glucose 70 - 99 mg/dL 782  956  94   BUN 8 - 23 mg/dL 16  20  18    Creatinine 0.61 - 1.24 mg/dL 2.13  0.86  5.78   Sodium 135 - 145 mmol/L 141  138  140   Potassium 3.5 - 5.1 mmol/L 4.3  4.3  4.0   Chloride 98 - 111 mmol/L 105  104  108  CO2 22 - 32 mmol/L 30  28  24    Calcium 8.9 - 10.3 mg/dL 9.1  9.6  9.4   Total Protein 6.5 - 8.1 g/dL 6.4  7.1  6.6   Total Bilirubin 0.3 - 1.2 mg/dL 0.6  0.7  0.6   Alkaline Phos 38 - 126 U/L 46  53  50   AST 15 - 41 U/L 17  17  19    ALT 0 - 44 U/L 20  20  21        RADIOGRAPHIC STUDIES: I have personally reviewed the radiological images as listed and agreed with the findings in the report. No results found.    No orders of the defined types were placed in this encounter.  All questions were answered. The patient knows to call the clinic with any problems, questions or concerns. No barriers to learning was detected. The total time spent in the appointment was 20 minutes.     Malachy Mood, MD 10/23/2022   Carolin Coy, CMA, am acting as scribe for Malachy Mood, MD.   I have reviewed the above documentation for accuracy and completeness, and I agree with the above.

## 2022-10-23 NOTE — Assessment & Plan Note (Signed)
ABC Subtype, stage IV -After worsening fatigue pt presented to ED. 04/20/20 CT CAP showed large mediastinal mass and diffuse liver masses, and diffuse retroperitoneal lymphadenopathy. 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 Given his liver metastasis and LN involvement, this is stage IV. His lymphoma FISH panel is negative for double hit lymphoma -He completed first-line Inpatient R-EPOCH q3weeks for 6 cycles on 05/01/20-08/27/20. Given his high risk for CNS involvement, treatment involved intrathecal treatment with methotrexate prophylactically from C2. He is currently on surveillance/observation. -most recent CT CAP 04/21/22 was stable, no adenopathy or splenomegaly. I personally reviewed the scan images and discussed results with them today. -he is now 2 years out from diagnosis. We will continue to follow him for 5 years. I do not plan to repeat surveillance scan unless he develops symptoms

## 2023-04-22 NOTE — Assessment & Plan Note (Signed)
ABC Subtype, stage IV -After worsening fatigue pt presented to ED. 04/20/20 CT CAP showed large mediastinal mass and diffuse liver masses, and diffuse retroperitoneal lymphadenopathy. 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 Given his liver metastasis and LN involvement, this is stage IV. His lymphoma FISH panel is negative for double hit lymphoma -He completed first-line Inpatient R-EPOCH q3weeks for 6 cycles on 05/01/20-08/27/20. Given his high risk for CNS involvement, treatment involved intrathecal treatment with methotrexate prophylactically from C2. He is currently on surveillance/observation. -most recent CT CAP 04/21/22 was stable, no adenopathy or splenomegaly. I personally reviewed the scan images and discussed results with them today. -he is now 3 years out from diagnosis. We will continue to follow him for 5 years. I do not plan to repeat surveillance scan unless he develops symptoms

## 2023-04-23 ENCOUNTER — Inpatient Hospital Stay (HOSPITAL_BASED_OUTPATIENT_CLINIC_OR_DEPARTMENT_OTHER): Payer: HMO | Admitting: Hematology

## 2023-04-23 ENCOUNTER — Inpatient Hospital Stay: Payer: HMO | Attending: Hematology

## 2023-04-23 VITALS — BP 134/64 | HR 66 | Temp 98.0°F | Resp 18 | Ht 71.0 in | Wt 226.3 lb

## 2023-04-23 DIAGNOSIS — C8338 Diffuse large B-cell lymphoma, lymph nodes of multiple sites: Secondary | ICD-10-CM | POA: Diagnosis not present

## 2023-04-23 DIAGNOSIS — I10 Essential (primary) hypertension: Secondary | ICD-10-CM | POA: Insufficient documentation

## 2023-04-23 DIAGNOSIS — N400 Enlarged prostate without lower urinary tract symptoms: Secondary | ICD-10-CM | POA: Insufficient documentation

## 2023-04-23 DIAGNOSIS — Z79899 Other long term (current) drug therapy: Secondary | ICD-10-CM | POA: Insufficient documentation

## 2023-04-23 DIAGNOSIS — C833A Diffuse large b-cell lymphoma, in remission: Secondary | ICD-10-CM | POA: Insufficient documentation

## 2023-04-23 LAB — CBC WITH DIFFERENTIAL (CANCER CENTER ONLY)
Abs Immature Granulocytes: 0.03 10*3/uL (ref 0.00–0.07)
Basophils Absolute: 0 10*3/uL (ref 0.0–0.1)
Basophils Relative: 1 %
Eosinophils Absolute: 0.1 10*3/uL (ref 0.0–0.5)
Eosinophils Relative: 2 %
HCT: 42.3 % (ref 39.0–52.0)
Hemoglobin: 14.2 g/dL (ref 13.0–17.0)
Immature Granulocytes: 1 %
Lymphocytes Relative: 14 %
Lymphs Abs: 0.7 10*3/uL (ref 0.7–4.0)
MCH: 29.6 pg (ref 26.0–34.0)
MCHC: 33.6 g/dL (ref 30.0–36.0)
MCV: 88.1 fL (ref 80.0–100.0)
Monocytes Absolute: 0.7 10*3/uL (ref 0.1–1.0)
Monocytes Relative: 13 %
Neutro Abs: 3.7 10*3/uL (ref 1.7–7.7)
Neutrophils Relative %: 69 %
Platelet Count: 116 10*3/uL — ABNORMAL LOW (ref 150–400)
RBC: 4.8 MIL/uL (ref 4.22–5.81)
RDW: 13.6 % (ref 11.5–15.5)
WBC Count: 5.3 10*3/uL (ref 4.0–10.5)
nRBC: 0 % (ref 0.0–0.2)

## 2023-04-23 LAB — LACTATE DEHYDROGENASE: LDH: 138 U/L (ref 98–192)

## 2023-04-23 LAB — CMP (CANCER CENTER ONLY)
ALT: 19 U/L (ref 0–44)
AST: 19 U/L (ref 15–41)
Albumin: 4 g/dL (ref 3.5–5.0)
Alkaline Phosphatase: 48 U/L (ref 38–126)
Anion gap: 6 (ref 5–15)
BUN: 18 mg/dL (ref 8–23)
CO2: 28 mmol/L (ref 22–32)
Calcium: 9.1 mg/dL (ref 8.9–10.3)
Chloride: 106 mmol/L (ref 98–111)
Creatinine: 0.96 mg/dL (ref 0.61–1.24)
GFR, Estimated: >60 mL/min (ref >60)
Glucose, Bld: 111 mg/dL — ABNORMAL HIGH (ref 70–99)
Potassium: 3.9 mmol/L (ref 3.5–5.1)
Sodium: 140 mmol/L (ref 135–145)
Total Bilirubin: 0.8 mg/dL (ref <1.2)
Total Protein: 6.5 g/dL (ref 6.5–8.1)

## 2023-04-23 NOTE — Addendum Note (Signed)
Addended by: Arcelia Jew on: 04/23/2023 08:33 AM   Modules accepted: Orders

## 2023-04-23 NOTE — Progress Notes (Signed)
[facility]   [phone] [phone]   Clinic Follow up Note   Patient Care Team: Barbie Banner, MD as PCP - General (Family Medicine)  Date of Service:  04/23/2023  CHIEF COMPLAINT: f/u of diffuse large B-cell lymphoma  CURRENT THERAPY:  Surveillance  Oncology History   Diffuse large B cell lymphoma (HCC)  ABC Subtype, stage IV -After worsening fatigue pt presented to ED. 04/20/20 CT CAP showed large mediastinal mass and diffuse liver masses, and diffuse retroperitoneal lymphadenopathy. 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 Given his liver metastasis and LN involvement, this is stage IV. His lymphoma FISH panel is negative for double hit lymphoma -He completed first-line Inpatient R-EPOCH q3weeks for 6 cycles on 05/01/20-08/27/20. Given his high risk for CNS involvement, treatment involved intrathecal treatment with methotrexate prophylactically from C2. He is currently on surveillance/observation. -most recent CT CAP 04/21/22 was stable, no adenopathy or splenomegaly. I personally reviewed the scan images and discussed results with them today. -he is now 3 years out from diagnosis. We will continue to follow him for 5 years. I do not plan to repeat surveillance scan unless he develops symptoms   Assessment and Plan    Diffuse Large B-Cell Lymphoma (DLBCL) Three years post-diagnosis of DLBCL, currently in remission with no signs of recurrence. The risk of recurrence decreases significantly after three years, reducing the need for repeat scans. Mildly low platelet count likely due to previous chemotherapy but within a safe range, not at risk for bleeding. Hemoglobin, white count, kidney, liver function, and LDH are normal. - Continue follow-up every six months with lab work - Monitor for any new symptoms and report immediately - No need for repeat scans unless clinically  indicated  Hypertension Hypertension managed with metoprolol tartrate. Reports taking half a tablet once daily instead of twice daily, but the total daily dose remains the same. - Continue metoprolol tartrate, half a tablet once daily - Monitor blood pressure regularly  Benign Prostatic Hyperplasia (BPH) BPH managed with doxazosin. No new symptoms or concerns reported. - Continue doxazosin as prescribed  General Health Maintenance Received all recommended vaccinations including flu, COVID-19, RSV, shingles, and pneumonia. Regular follow-ups with primary care physician and annual dermatology visits. Recent dermatology biopsy for a suspicious lesion on the head was benign. - Continue regular follow-ups with primary care physician and dermatologist - Maintain up-to-date vaccinations  Follow-up - Schedule follow-up visit in six months - Review lab results and contact if any abnormalities are found - Use MyChart for communication and review of lab results.         SUMMARY OF ONCOLOGIC HISTORY: Oncology History Overview Note  Cancer Staging Diffuse large B cell lymphoma (HCC) Staging form: Hodgkin and Non-Hodgkin Lymphoma, AJCC 8th Edition - Clinical stage from 04/23/2020: Stage IV (Diffuse large B-cell lymphoma) - Signed by Malachy Mood, MD on 04/29/2020 Stage prefix: Initial diagnosis    Diffuse large B cell lymphoma (HCC)  04/20/2020 Imaging   CT CAP  IMPRESSION: 1. Large anterior mediastinal/prevascular space mass/adenopathy. The mass extends superiorly and abuts the upper trachea. 2. Large hypodense lesion in the right lobe of the liver as well as ill-defined splenic hypodense lesions. Further characterization with MRI without and with contrast recommended. The liver lesion is amenable to percutaneous tissue sampling. 3. Extensive mesenteric and retroperitoneal adenopathy. 4. Sigmoid diverticulosis. No bowel obstruction. Normal appendix. 5. Aortic Atherosclerosis  (ICD10-I70.0).   04/23/2020 Cancer Staging  Staging form: Hodgkin and Non-Hodgkin Lymphoma, AJCC 8th Edition - Clinical stage from 04/23/2020: Stage IV (Diffuse large B-cell lymphoma) - Signed by Malachy Mood, MD on 04/29/2020   04/23/2020 Initial Biopsy   FINAL MICROSCOPIC DIAGNOSIS:   A. LIVER, RIGHT MASS, NEEDLE CORE BIOPSY:  - Large B-cell lymphoma  - See comment     COMMENT:   The sections show needle core biopsy fragments displaying effacement of  the architecture by a dense infiltrate of primarily large atypical  lymphoid appearing cells displaying vesicular chromatin and small  nucleoli.  This is associated with apoptosis and brisk mitosis.  The  appearance is diffuse with lack of atypical follicles.  A large battery  of immunohistochemical stains was performed and shows that the atypical  lymphoid cells are positive for LCA, CD20, PAX 5, BCL-2, BCL6, CD5,  MUM-1 (partial).  The atypical lymphoid cells are negative for CD10,  CD30, CD34, CD138, cyclin D1, TdT, and EBV in situ hybridization,  synaptophysin, cytokeratin AE1/AE3, cytokeratin 20, cytokeratin 7,  cytokeratin 5/6, TTF-1, CD56, CDX2, and p40.  There is an admixed  relatively minor population of T-cells as primarily seen with CD3.  The  overall features are consistent with large B-cell lymphoma, ABC subtype.  The lymphomatous process displays expression of CD5.  This phenotype is  seen in 5-10% of mostly de novo large B-cell lymphoma cases or rarely as  a transformation of a low-grade B-cell lymphoma such as small  lymphocytic lymphoma.  Clinical correlation is recommended.    04/25/2020 Imaging   CT AP  IMPRESSION: 1. Development of high-density ascites in the abdomen and pelvis. Findings are most compatible with a small to moderate amount of hemoperitoneum and related to the recent liver biopsy. 2. Extensive lymphadenopathy throughout the abdomen and pelvis with a large mass or nodal mass near the pancreatic  head. There are additional lesions involving the liver and spleen. Findings are suggestive for metastatic disease. 3. Development of small bilateral pleural effusions.   These results were called by telephone at the time of interpretation on 04/25/2020 at 1:43 pm to provider ERIC [place] , who verbally acknowledged these results.   04/26/2020 Initial Diagnosis   High grade non-Hodgkin lymphoma (HCC)   05/01/2020 - 08/27/2020 Chemotherapy   Inpatient R-EPOCH q3weeks for 6 cycles starting 05/01/20 with intrathecal prophylactic treatment starting with C2. Completed      07/04/2020 PET scan   IMPRESSION: 1. Marked reduction in volume of anterior mediastinal mass now with low metabolic activity ( Deauville 2). 2. Reduction in volume of retroperitoneal nodal mass adjacent the pancreas with low metabolic activity ( Deauville 3). 3. No increased metabolic activity associated with the spleen 4. Interval contraction of hepatic hematoma post biopsy. No hypermetabolic elements within the hepatic lesion. 5. Grade 2 anterolisthesis L4 on L5.     10/03/2020 Imaging   PET IMPRESSION: 1. No evidence of residual or recurrent lymphoma. 2. Heterogeneous bilobed mass in the liver, unchanged and reportedly due to chronic post biopsy hematomas. 3. Enlarged prostate.  Associated bladder wall thickening. 4. Aortic atherosclerosis (ICD10-I70.0). Coronary artery calcification.     04/09/2021 Imaging   CT CAP  IMPRESSION: 1. Decreased size of the hypodense splenic lesions. No splenomegaly or adenopathy in the chest abdomen or pelvis. 2. Well-circumscribed hypodense 5.7 cm segment IVa/VIII hepatic lesion which demonstrates a tubular hyperdense area of enhancement which extends laterally to a well-circumscribed 3.7 cm hyperdense lesion. 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 Recommend further  evaluation with multiphase CT of the liver with without contrast. 3. Left-sided colonic diverticulosis without findings of acute diverticulitis. 4. Prostatomegaly. 5. Aortic Atherosclerosis (ICD10-I70.0).      Discussed the use of AI scribe software for clinical note transcription with the patient, who gave verbal consent to proceed.  History of Present Illness   The patient, a 77 year old gentleman with a history of diffuse large basal lymphoma, presents for a routine follow-up. He reports feeling well with no new symptoms or concerns. He has been eating well, maintaining good energy levels, and engaging in regular activities such as yard work. He has received his flu, COVID, shingles, and pneumonia vaccines. He has been taking metoprolol tartrate once daily instead of twice daily as previously prescribed, but the total daily dosage remains the same. He also takes doxazosin for his prostate. The patient was diagnosed with lymphoma three years ago and has been in remission since then. He denies any residual problems from previous chemotherapy, such as numbness or tingling. He also sees a primary care physician for regular check-ups and a dermatologist annually. He had a recent concern about a potential cancerous spot on his head, but a biopsy confirmed it was benign. He has a mildly low platelet count, likely a result of previous chemotherapy, but it is within a safe range and he has not experienced any bleeding problems.         All other systems were reviewed with the patient and are negative.  MEDICAL HISTORY:  Past Medical History:  Diagnosis Date   diffuse large b cell lymphoma 04/2020   Goals of care, counseling/discussion 05/06/2020   Hypertension    Knee pain, right     SURGICAL HISTORY: Past Surgical History:  Procedure Laterality Date   FOOT SURGERY     IR IMAGING GUIDED PORT INSERTION  05/28/2020   IR REMOVAL TUN ACCESS W/ PORT W/O FL MOD SED  02/07/2021   TONSILLECTOMY       I have reviewed the social history and family history with the patient and they are unchanged from previous note.  ALLERGIES:  has No Known Allergies.  MEDICATIONS:  Current Outpatient Medications  Medication Sig Dispense Refill   Ascorbic Acid (VITAMIN C WITH ROSE HIPS) 1000 MG tablet Take 1,000 mg by mouth daily.     Calcium Citrate-Vitamin D (CALCIUM CITRATE + D3) 200-6.25 MG-MCG TABS Take 2 tablets by mouth daily. Calcium Citrate/ D3 (400mg /500IU) 1 daily     doxazosin (CARDURA) 4 MG tablet Take 4 mg by mouth daily.     metoprolol tartrate (LOPRESSOR) 25 MG tablet Take 0.5 tablets (12.5 mg total) by mouth 2 (two) times daily. (Patient taking differently: Take 12.5 mg by mouth in the morning. Take 0.5 tablets (12.5 mg total) by mouth 1 time daily) 30 tablet 0   Misc Natural Products (PROSTATE SUPPORT PO) Take 2 capsules by mouth daily. Prostate Support take 2 times daily     Multiple Vitamins-Minerals (MULTIVITAMIN PO) Take 1 tablet by mouth daily.     Omega-3 Fatty Acids (OMEGA 3 PO) Take 1 tablet by mouth daily.     No current facility-administered medications for this visit.   Facility-Administered Medications Ordered in Other Visits  Medication Dose Route Frequency Provider Last Rate Last Admin   heparin lock flush 100 unit/mL  500 Units Intracatheter PRN Malachy Mood, MD       sodium chloride flush (NS) 0.9 % injection 10 mL  10  mL Intracatheter PRN Malachy Mood, MD        PHYSICAL EXAMINATION: ECOG PERFORMANCE STATUS: 0 - Asymptomatic  Vitals:   04/23/23 0811  BP: 134/64  Pulse: 66  Resp: 18  Temp: 98 F (36.7 C)  SpO2: 96%   Wt Readings from Last 3 Encounters:  04/23/23 226 lb 4.8 oz (102.6 kg)  10/23/22 226 lb 1.6 oz (102.6 kg)  04/24/22 224 lb 12.8 oz (102 kg)     GENERAL:alert, no distress and comfortable SKIN: skin color, texture, turgor are normal, no rashes or significant lesions EYES: normal, Conjunctiva are pink and non-injected, sclera clear NECK:  supple, thyroid normal size, non-tender, without nodularity LYMPH:  no palpable lymphadenopathy in the cervical, axillary  LUNGS: clear to auscultation and percussion with normal breathing effort HEART: regular rate & rhythm and no murmurs and no lower extremity edema ABDOMEN:abdomen soft, non-tender and normal bowel sounds Musculoskeletal:no cyanosis of digits and no clubbing  NEURO: alert & oriented x 3 with fluent speech, no focal motor/sensory deficits    LABORATORY DATA:  I have reviewed the data as listed    Latest Ref Rng & Units 04/23/2023    7:22 AM 10/23/2022   10:11 AM 04/21/2022    9:09 AM  CBC  WBC 4.0 - 10.5 K/uL 5.3  5.3  5.5   Hemoglobin 13.0 - 17.0 g/dL 21.3  08.6  57.8   Hematocrit 39.0 - 52.0 % 42.3  42.2  42.4   Platelets 150 - 400 K/uL 116  119  140         Latest Ref Rng & Units 04/23/2023    7:22 AM 10/23/2022   10:11 AM 04/21/2022    9:09 AM  CMP  Glucose 70 - 99 mg/dL 469  629  528   BUN 8 - 23 mg/dL 18  16  20    Creatinine 0.61 - 1.24 mg/dL 4.13  2.44  0.10   Sodium 135 - 145 mmol/L 140  141  138   Potassium 3.5 - 5.1 mmol/L 3.9  4.3  4.3   Chloride 98 - 111 mmol/L 106  105  104   CO2 22 - 32 mmol/L 28  30  28    Calcium 8.9 - 10.3 mg/dL 9.1  9.1  9.6   Total Protein 6.5 - 8.1 g/dL 6.5  6.4  7.1   Total Bilirubin <1.2 mg/dL 0.8  0.6  0.7   Alkaline Phos 38 - 126 U/L 48  46  53   AST 15 - 41 U/L 19  17  17    ALT 0 - 44 U/L 19  20  20        RADIOGRAPHIC STUDIES: I have personally reviewed the radiological images as listed and agreed with the findings in the report. No results found.    No orders of the defined types were placed in this encounter.  All questions were answered. The patient knows to call the clinic with any problems, questions or concerns. No barriers to learning was detected. The total time spent in the appointment was 20 minutes.     Malachy Mood, MD 04/23/2023

## 2023-06-03 ENCOUNTER — Other Ambulatory Visit: Payer: Self-pay | Admitting: Medical Genetics

## 2023-06-25 ENCOUNTER — Other Ambulatory Visit (HOSPITAL_COMMUNITY)
Admission: RE | Admit: 2023-06-25 | Discharge: 2023-06-25 | Disposition: A | Discharge status: Home / Self Care | Payer: Self-pay | Source: Ambulatory Visit | Attending: Medical Genetics | Admitting: Medical Genetics

## 2023-07-06 LAB — GENECONNECT MOLECULAR SCREEN: Genetic Analysis Overall Interpretation: NEGATIVE

## 2023-10-21 ENCOUNTER — Telehealth: Payer: Self-pay | Admitting: Hematology

## 2023-10-22 ENCOUNTER — Inpatient Hospital Stay: Payer: HMO

## 2023-10-22 ENCOUNTER — Other Ambulatory Visit: Payer: Self-pay | Admitting: Nurse Practitioner

## 2023-10-22 ENCOUNTER — Inpatient Hospital Stay: Payer: HMO | Admitting: Hematology

## 2023-10-22 DIAGNOSIS — C8338 Diffuse large B-cell lymphoma, lymph nodes of multiple sites: Secondary | ICD-10-CM

## 2023-10-22 NOTE — Assessment & Plan Note (Signed)
 ABC Subtype, stage IV -After worsening fatigue pt presented to ED. 04/20/20 CT CAP showed large mediastinal mass and diffuse liver masses, and diffuse retroperitoneal lymphadenopathy. 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 Given his liver metastasis and LN involvement, this is stage IV. His lymphoma FISH panel is negative for double hit lymphoma -He completed first-line Inpatient R-EPOCH q3weeks for 6 cycles on 05/01/20-08/27/20. Given his high risk for CNS involvement, treatment involved intrathecal treatment with methotrexate prophylactically from C2. He is currently on surveillance/observation. -most recent CT CAP 04/21/22 was stable, no adenopathy or splenomegaly. I personally reviewed the scan images and discussed results with them today. -he is now 3 years out from diagnosis. We will continue to follow him for 5 years. I do not plan to repeat surveillance scan unless he develops symptoms

## 2023-10-22 NOTE — Progress Notes (Signed)
 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 04/20/20 CT CAP showed large mediastinal mass and diffuse liver masses, and diffuse retroperitoneal lymphadenopathy. 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 Given his liver metastasis and LN involvement, this is stage IV. His lymphoma FISH panel is negative for double hit lymphoma -He completed first-line Inpatient R-EPOCH q3weeks for 6 cycles on 05/01/20-08/27/20. Given his high risk for CNS involvement, treatment involved intrathecal treatment with methotrexate  prophylactically from C2. He is currently on surveillance/observation. -most recent CT CAP 04/21/22 was stable, no adenopathy or splenomegaly. I personally reviewed the scan images and discussed results with them today. -he is now 3 years out from diagnosis. We will continue to follow him for 5 years. Repeat surveillance scan not felt to be neccessary unless he develops symptoms - 10/22/2023 -will report new skin lesion to dermatology.  Labs with slight and stable reduction in platelet count.  Will continue cancer surveillance with labs and follow-up in 6 months, sooner if needed.    Skin lesion Patient reporting a new spot on his right anterior forearm.  Mentions it is getting larger and darker.  He does have a dermatologist, but has not seen a provider regarding this changing lesion.  Recommend he contact dermatologist to discuss and evaluate this need skin lesion.  Plan: Labs reviewed.  - Mildly reduced platelet count at 111.  Remainder of CBC and CMP are essentially normal. -LDH normal at 132. Can continue with  cancer surveillance. Repeat labs and follow-up in 6 months.  May be seen sooner if needed.   The patient understands the plans discussed today and is in agreement with them.  He knows to contact our office if he develops concerns prior to his next appointment.  I provided 25 minutes of face-to-face time during this encounter and > 50% was spent counseling as documented under my assessment and plan.    Sharyon Deis, NP   [facility] - A DEPT OF [facility] [address] [place] [address] Dept: [phone] Dept Fax: [phone]   No orders of the defined types were placed in this encounter.     CHIEF COMPLAINT:  CC: diffuse large b-cell lymphoma of lymph nodes of multiple regions  Current Treatment:  surveillance  INTERVAL HISTORY:  Montay is here today for repeat clinical assessment. He was last seen by Dr. Maryalice Smaller 04/23/2023.  He reports a new skin lesion on her right posterior forearm.  He has not discussed with his dermatologist yet.   He reports feeling well otherwise. She denies chest pain, chest pressure, or shortness of breath. She denies headaches or visual disturbances. She denies abdominal pain, nausea, vomiting, or changes in bowel or bladder habits.  He denies fevers or chills. He denies pain. His appetite is good. His weight has been stable.  I have reviewed the past medical history, past surgical history, social history and family history with the patient and they are unchanged from previous note.  ALLERGIES:  has no known allergies.  MEDICATIONS:  Current Outpatient Medications  Medication Sig Dispense Refill   Ascorbic  Acid (VITAMIN C WITH ROSE HIPS) 1000 MG tablet Take 1,000 mg by mouth daily.     Calcium Citrate-Vitamin D (CALCIUM CITRATE + D3) 200-6.25 MG-MCG TABS Take 2 tablets by mouth daily. Calcium Citrate/ D3 (400mg /500IU) 1 daily     doxazosin  (CARDURA ) 4 MG tablet Take 4 mg by mouth  daily.     metoprolol  succinate (TOPROL -XL) 25 MG 24 hr tablet Take 25 mg by mouth daily.     Misc Natural Products (PROSTATE SUPPORT PO) Take 2 capsules by mouth daily. Prostate Support take 2 times daily     Multiple Vitamins-Minerals (MULTIVITAMIN PO) Take 1 tablet by mouth daily.     Omega-3 Fatty Acids (OMEGA 3 PO) Take 1 tablet by mouth daily.     No current facility-administered medications for this visit.   Facility-Administered Medications Ordered in Other Visits  Medication Dose Route Frequency Provider Last Rate Last Admin   heparin  lock flush 100 unit/mL  500 Units Intracatheter PRN Sonja [place], MD       sodium chloride  flush (NS) 0.9 % injection 10 mL  10 mL Intracatheter PRN Sonja Bogata, MD        HISTORY OF PRESENT ILLNESS:   Oncology History Overview Note  Cancer Staging Diffuse large B cell lymphoma (HCC) Staging form: Hodgkin and Non-Hodgkin Lymphoma, AJCC 8th Edition - Clinical stage from 04/23/2020: Stage IV (Diffuse large B-cell lymphoma) - Signed by Sonja [place], MD on 04/29/2020 Stage prefix: Initial diagnosis    Diffuse large B cell lymphoma (HCC)  04/20/2020 Imaging   CT CAP  IMPRESSION: 1. Large anterior mediastinal/prevascular space mass/adenopathy. The mass extends superiorly and abuts the upper trachea. 2. Large hypodense lesion in the right lobe of the liver as well as ill-defined splenic hypodense lesions. Further characterization with MRI without and with contrast recommended. The liver lesion is amenable to percutaneous tissue sampling. 3. Extensive mesenteric and retroperitoneal adenopathy. 4. Sigmoid diverticulosis. No bowel obstruction. Normal appendix. 5. Aortic Atherosclerosis (ICD10-I70.0).   04/23/2020 Cancer Staging   Staging form: Hodgkin and Non-Hodgkin Lymphoma, AJCC 8th Edition - Clinical stage from 04/23/2020: Stage IV (Diffuse large B-cell lymphoma) - Signed by Sonja Hal[place], MD on 04/29/2020   04/23/2020 Initial Biopsy   FINAL  MICROSCOPIC DIAGNOSIS:   A. LIVER, RIGHT MASS, NEEDLE CORE BIOPSY:  - Large B-cell lymphoma  - See comment     COMMENT:   The sections show needle core biopsy fragments displaying effacement of  the architecture by a dense infiltrate of primarily large atypical  lymphoid appearing cells displaying vesicular chromatin and small  nucleoli.  This is associated with apoptosis and brisk mitosis.  The  appearance is diffuse with lack of atypical follicles.  A large battery  of immunohistochemical stains was performed and shows that the atypical  lymphoid cells are positive for LCA, CD20, PAX 5, BCL-2, BCL6, CD5,  MUM-1 (partial).  The atypical lymphoid cells are negative for CD10,  CD30, CD34, CD138, cyclin D1, TdT, and EBV in situ hybridization,  synaptophysin, cytokeratin AE1/AE3, cytokeratin 20, cytokeratin 7,  cytokeratin 5/6, TTF-1, CD56, CDX2, and p40.  There is an admixed  relatively minor population of T-cells as primarily seen with CD3.  The  overall features are consistent with large B-cell lymphoma, ABC subtype.  The lymphomatous process displays expression of CD5.  This phenotype is  seen in 5-10% of mostly de novo large B-cell lymphoma cases or rarely as  a transformation of a low-grade B-cell lymphoma such as small  lymphocytic lymphoma.  Clinical correlation is recommended.    04/25/2020 Imaging   CT AP  IMPRESSION: 1. Development of high-density ascites in the abdomen and pelvis. Findings are most compatible with a small to moderate amount of hemoperitoneum and related to the recent liver biopsy. 2. Extensive lymphadenopathy throughout the abdomen and pelvis with a large mass or nodal mass near the pancreatic head. There are additional lesions involving the liver and spleen. Findings are suggestive for metastatic disease. 3. Development of small bilateral pleural effusions.   These results were called by telephone at the time of interpretation on 04/25/2020 at 1:43  pm to provider ERIC [place] , who verbally acknowledged these results.   04/26/2020 Initial Diagnosis   High grade non-Hodgkin lymphoma (HCC)   05/01/2020 - 08/27/2020 Chemotherapy   Inpatient R-EPOCH q3weeks for 6 cycles starting 05/01/20 with intrathecal prophylactic treatment starting with C2. Completed      07/04/2020 PET scan   IMPRESSION: 1. Marked reduction in volume of anterior mediastinal mass now with low metabolic activity ( Deauville 2). 2. Reduction in volume of retroperitoneal nodal mass adjacent the pancreas with low metabolic activity ( Deauville 3). 3. No increased metabolic activity associated with the spleen 4. Interval contraction of hepatic hematoma post biopsy. No hypermetabolic elements within the hepatic lesion. 5. Grade 2 anterolisthesis L4 on L5.     10/03/2020 Imaging   PET IMPRESSION: 1. No evidence of residual or recurrent lymphoma. 2. Heterogeneous bilobed mass in the liver, unchanged and reportedly due to chronic post biopsy hematomas. 3. Enlarged prostate.  Associated bladder wall thickening. 4. Aortic atherosclerosis (ICD10-I70.0). Coronary artery calcification.     04/09/2021 Imaging   CT CAP  IMPRESSION: 1. Decreased size of the hypodense splenic lesions. No splenomegaly or adenopathy in the chest abdomen or pelvis. 2. Well-circumscribed hypodense 5.7 cm segment IVa/VIII hepatic lesion which demonstrates a tubular hyperdense area of enhancement which extends laterally to a well-circumscribed 3.7 cm hyperdense lesion. 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 Recommend further evaluation with multiphase CT of the liver with without contrast. 3. Left-sided colonic diverticulosis without findings of acute diverticulitis. 4. Prostatomegaly. 5. Aortic Atherosclerosis (ICD10-I70.0).       REVIEW OF SYSTEMS:   Constitutional: Denies fevers,  chills or abnormal weight loss Eyes: Denies blurriness of vision Ears, nose, mouth, throat, and face: Denies mucositis or sore throat Respiratory: Denies cough, dyspnea or wheezes Cardiovascular: Denies palpitation, chest discomfort or lower extremity swelling Gastrointestinal:  Denies nausea, heartburn or change in bowel habits Skin: Denies abnormal skin rashes. Has new skin lesion on the right anterior forearm. Reports it getting darker and larger.  Lymphatics: Denies new lymphadenopathy or easy bruising Neurological:Denies numbness, tingling or new weaknesses Behavioral/Psych: Mood is stable, no new changes  All other systems were reviewed with the patient and are negative.   VITALS:   Today's Vitals   10/23/23 0834 10/23/23 0835  BP: (!) 130/54   Pulse: 84   Resp: 20   Temp: 98.1 F (36.7 C)   TempSrc: Temporal   SpO2: 98%   Weight: 228 lb 9.6 oz (103.7 kg)   PainSc:  0-No pain   Body mass index is 31.88 kg/m.   Wt Readings from Last 3 Encounters:  10/23/23 228 lb 9.6 oz (103.7 kg)  04/23/23 226 lb 4.8 oz (102.6 kg)  10/23/22 226 lb 1.6 oz (102.6 kg)    Body mass index is 31.88 kg/m.  Performance status (ECOG):  0 - Asymptomatic  PHYSICAL EXAM:   GENERAL:alert, no distress and comfortable SKIN: skin color, texture, turgor are normal. No rashes.  He has an approximate 5 mm lesion on right forearm.  It is round with slightly irregular borders.  It is a reddish-brown color.  Has rough texture.  Skin intact without drainage. EYES: normal, Conjunctiva are pink and non-injected, sclera clear OROPHARYNX:no exudate, no erythema and lips, buccal mucosa, and tongue normal  NECK: supple, thyroid  normal size, non-tender, without nodularity LYMPH:  no palpable lymphadenopathy in the cervical, axillary or inguinal LUNGS: clear to auscultation and percussion with normal breathing effort HEART: regular rate & rhythm and no murmurs and no lower extremity edema ABDOMEN:abdomen  soft, non-tender and normal bowel sounds Musculoskeletal:no cyanosis of digits and no clubbing  NEURO: alert & oriented x 3 with fluent speech, no focal motor/sensory deficits  LABORATORY DATA:  I have reviewed the data as listed    Component Value Date/Time   NA 138 10/23/2023 0818   K 3.9 10/23/2023 0818   CL 107 10/23/2023 0818   CO2 24 10/23/2023 0818   GLUCOSE 117 (H) 10/23/2023 0818   BUN 19 10/23/2023 0818   CREATININE 0.88 10/23/2023 0818   CALCIUM 8.9 10/23/2023 0818   PROT 6.3 (L) 10/23/2023 0818   ALBUMIN 4.1 10/23/2023 0818   AST 17 10/23/2023 0818   ALT 17 10/23/2023 0818   ALKPHOS 45 10/23/2023 0818   BILITOT 0.5 10/23/2023 0818   GFRNONAA >60 10/23/2023 0818   GFRAA >60 06/09/2018 2005    Lab Results  Component Value Date   WBC 4.3 10/23/2023   NEUTROABS 3.0 10/23/2023   HGB 14.1 10/23/2023   HCT 40.4 10/23/2023   MCV 85.1 10/23/2023   PLT 111 (L) 10/23/2023

## 2023-10-22 NOTE — Assessment & Plan Note (Addendum)
 ABC Subtype, stage IV -After worsening fatigue pt presented to ED. 04/20/20 CT CAP showed large mediastinal mass and diffuse liver masses, and diffuse retroperitoneal lymphadenopathy. 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 Given his liver metastasis and LN involvement, this is stage IV. His lymphoma FISH panel is negative for double hit lymphoma -He completed first-line Inpatient R-EPOCH q3weeks for 6 cycles on 05/01/20-08/27/20. Given his high risk for CNS involvement, treatment involved intrathecal treatment with methotrexate  prophylactically from C2. He is currently on surveillance/observation. -most recent CT CAP 04/21/22 was stable, no adenopathy or splenomegaly. I personally reviewed the scan images and discussed results with them today. -he is now 3 years out from diagnosis. We will continue to follow him for 5 years. Repeat surveillance scan not felt to be neccessary unless he develops symptoms - 10/22/2023 -will report new skin lesion to dermatology.  Labs with slight and stable reduction in platelet count.  Will continue cancer surveillance with labs and follow-up in 6 months, sooner if needed.

## 2023-10-23 ENCOUNTER — Inpatient Hospital Stay: Admitting: Nurse Practitioner

## 2023-10-23 ENCOUNTER — Inpatient Hospital Stay: Attending: Nurse Practitioner

## 2023-10-23 VITALS — BP 130/54 | HR 84 | Temp 98.1°F | Resp 20 | Wt 228.6 lb

## 2023-10-23 DIAGNOSIS — C8338 Diffuse large B-cell lymphoma, lymph nodes of multiple sites: Secondary | ICD-10-CM

## 2023-10-23 DIAGNOSIS — C833A Diffuse large b-cell lymphoma, in remission: Secondary | ICD-10-CM | POA: Insufficient documentation

## 2023-10-23 DIAGNOSIS — L989 Disorder of the skin and subcutaneous tissue, unspecified: Secondary | ICD-10-CM | POA: Insufficient documentation

## 2023-10-23 LAB — CBC WITH DIFFERENTIAL (CANCER CENTER ONLY)
Abs Immature Granulocytes: 0.01 10*3/uL (ref 0.00–0.07)
Basophils Absolute: 0.1 10*3/uL (ref 0.0–0.1)
Basophils Relative: 1 %
Eosinophils Absolute: 0.1 10*3/uL (ref 0.0–0.5)
Eosinophils Relative: 2 %
HCT: 40.4 % (ref 39.0–52.0)
Hemoglobin: 14.1 g/dL (ref 13.0–17.0)
Immature Granulocytes: 0 %
Lymphocytes Relative: 13 %
Lymphs Abs: 0.6 10*3/uL — ABNORMAL LOW (ref 0.7–4.0)
MCH: 29.7 pg (ref 26.0–34.0)
MCHC: 34.9 g/dL (ref 30.0–36.0)
MCV: 85.1 fL (ref 80.0–100.0)
Monocytes Absolute: 0.6 10*3/uL (ref 0.1–1.0)
Monocytes Relative: 14 %
Neutro Abs: 3 10*3/uL (ref 1.7–7.7)
Neutrophils Relative %: 70 %
Platelet Count: 111 10*3/uL — ABNORMAL LOW (ref 150–400)
RBC: 4.75 MIL/uL (ref 4.22–5.81)
RDW: 13.6 % (ref 11.5–15.5)
WBC Count: 4.3 10*3/uL (ref 4.0–10.5)
nRBC: 0 % (ref 0.0–0.2)

## 2023-10-23 LAB — CMP (CANCER CENTER ONLY)
ALT: 17 U/L (ref 0–44)
AST: 17 U/L (ref 15–41)
Albumin: 4.1 g/dL (ref 3.5–5.0)
Alkaline Phosphatase: 45 U/L (ref 38–126)
Anion gap: 7 (ref 5–15)
BUN: 19 mg/dL (ref 8–23)
CO2: 24 mmol/L (ref 22–32)
Calcium: 8.9 mg/dL (ref 8.9–10.3)
Chloride: 107 mmol/L (ref 98–111)
Creatinine: 0.88 mg/dL (ref 0.61–1.24)
GFR, Estimated: >60 mL/min (ref >60)
Glucose, Bld: 117 mg/dL — ABNORMAL HIGH (ref 70–99)
Potassium: 3.9 mmol/L (ref 3.5–5.1)
Sodium: 138 mmol/L (ref 135–145)
Total Bilirubin: 0.5 mg/dL (ref 0.0–1.2)
Total Protein: 6.3 g/dL — ABNORMAL LOW (ref 6.5–8.1)

## 2023-10-23 LAB — LACTATE DEHYDROGENASE: LDH: 132 U/L (ref 98–192)

## 2023-11-02 ENCOUNTER — Encounter: Payer: Self-pay | Admitting: Hematology

## 2023-11-02 ENCOUNTER — Encounter: Payer: Self-pay | Admitting: Nurse Practitioner

## 2024-04-20 ENCOUNTER — Other Ambulatory Visit: Payer: Self-pay

## 2024-04-20 DIAGNOSIS — C8338 Diffuse large B-cell lymphoma, lymph nodes of multiple sites: Secondary | ICD-10-CM

## 2024-04-20 NOTE — Assessment & Plan Note (Signed)
 ABC Subtype, stage IV -After worsening fatigue pt presented to ED. 04/20/20 CT CAP showed large mediastinal mass and diffuse liver masses, and diffuse retroperitoneal lymphadenopathy. 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 Given his liver metastasis and LN involvement, this is stage IV. His lymphoma FISH panel is negative for double hit lymphoma -He completed first-line Inpatient R-EPOCH q3weeks for 6 cycles on 05/01/20-08/27/20. Given his high risk for CNS involvement, treatment involved intrathecal treatment with methotrexate  prophylactically from C2. He is currently on surveillance/observation. -most recent CT CAP 04/21/22 was stable, no adenopathy or splenomegaly. I personally reviewed the scan images and discussed results with them today. -he is now 4 years out from diagnosis. We will continue to follow him for 5 years. I do not plan to repeat surveillance scan unless he develops symptoms

## 2024-04-21 ENCOUNTER — Encounter: Payer: Self-pay | Admitting: Hematology

## 2024-04-21 ENCOUNTER — Inpatient Hospital Stay: Attending: Hematology

## 2024-04-21 ENCOUNTER — Inpatient Hospital Stay: Admitting: Hematology

## 2024-04-21 VITALS — BP 138/52 | HR 69 | Temp 98.0°F | Resp 17 | Wt 224.7 lb

## 2024-04-21 DIAGNOSIS — C833A Diffuse large b-cell lymphoma, in remission: Secondary | ICD-10-CM | POA: Insufficient documentation

## 2024-04-21 DIAGNOSIS — C8338 Diffuse large B-cell lymphoma, lymph nodes of multiple sites: Secondary | ICD-10-CM

## 2024-04-21 DIAGNOSIS — Z85828 Personal history of other malignant neoplasm of skin: Secondary | ICD-10-CM | POA: Insufficient documentation

## 2024-04-21 LAB — CBC WITH DIFFERENTIAL (CANCER CENTER ONLY)
Abs Immature Granulocytes: 0.02 K/uL (ref 0.00–0.07)
Basophils Absolute: 0.1 K/uL (ref 0.0–0.1)
Basophils Relative: 1 %
Eosinophils Absolute: 0.1 K/uL (ref 0.0–0.5)
Eosinophils Relative: 2 %
HCT: 43.9 % (ref 39.0–52.0)
Hemoglobin: 15.1 g/dL (ref 13.0–17.0)
Immature Granulocytes: 0 %
Lymphocytes Relative: 14 %
Lymphs Abs: 0.7 K/uL (ref 0.7–4.0)
MCH: 29.6 pg (ref 26.0–34.0)
MCHC: 34.4 g/dL (ref 30.0–36.0)
MCV: 86.1 fL (ref 80.0–100.0)
Monocytes Absolute: 0.7 K/uL (ref 0.1–1.0)
Monocytes Relative: 14 %
Neutro Abs: 3.4 K/uL (ref 1.7–7.7)
Neutrophils Relative %: 69 %
Platelet Count: 130 K/uL — ABNORMAL LOW (ref 150–400)
RBC: 5.1 MIL/uL (ref 4.22–5.81)
RDW: 13.4 % (ref 11.5–15.5)
WBC Count: 4.9 K/uL (ref 4.0–10.5)
nRBC: 0 % (ref 0.0–0.2)

## 2024-04-21 LAB — CMP (CANCER CENTER ONLY)
ALT: 19 U/L (ref 0–44)
AST: 18 U/L (ref 15–41)
Albumin: 4.2 g/dL (ref 3.5–5.0)
Alkaline Phosphatase: 50 U/L (ref 38–126)
Anion gap: 7 (ref 5–15)
BUN: 21 mg/dL (ref 8–23)
CO2: 26 mmol/L (ref 22–32)
Calcium: 9.2 mg/dL (ref 8.9–10.3)
Chloride: 105 mmol/L (ref 98–111)
Creatinine: 0.95 mg/dL (ref 0.61–1.24)
GFR, Estimated: >60 mL/min (ref >60)
Glucose, Bld: 118 mg/dL — ABNORMAL HIGH (ref 70–99)
Potassium: 4.2 mmol/L (ref 3.5–5.1)
Sodium: 138 mmol/L (ref 135–145)
Total Bilirubin: 0.7 mg/dL (ref 0.0–1.2)
Total Protein: 6.4 g/dL — ABNORMAL LOW (ref 6.5–8.1)

## 2024-04-21 LAB — LACTATE DEHYDROGENASE: LDH: 151 U/L (ref 105–235)

## 2024-04-21 NOTE — Progress Notes (Signed)
 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 04/20/20 CT CAP showed large mediastinal mass and diffuse liver masses, and diffuse retroperitoneal lymphadenopathy. 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 Given his liver metastasis and LN involvement, this is stage IV. His lymphoma FISH panel is negative for double hit lymphoma -He completed first-line Inpatient R-EPOCH q3weeks for 6 cycles on 05/01/20-08/27/20. Given his high risk for CNS involvement, treatment involved intrathecal treatment with methotrexate  prophylactically from C2. He is currently on surveillance/observation. -most recent CT CAP 04/21/22 was stable, no adenopathy or splenomegaly. I personally reviewed the scan images and discussed results with them today. -he is now 4 years out from diagnosis. We will continue to follow him for 5 years. I do not plan to repeat surveillance scan unless he develops symptoms  Assessment & Plan History of diffuse large B-cell lymphoma, in remission Diffuse large B-cell lymphoma has been in remission for four years since November 2021. The risk of recurrence after five years is less than 5%, indicating a very low risk. - Scheduled follow-up visit in one year, which will be the last visit unless new symptoms arise.  Persistent thrombocytopenia following lymphoma treatment Thrombocytopenia persists post-lymphoma treatment but has improved, with a current platelet count of 130,000, which  is better than previous levels.  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 No current issues reported.  Plan - Patient is doing very well, asymptomatic. - Lab reviewed, exam was unremarkable, says no clinical concern for recurrence. - Lab and follow-up in 1 year for last visit.   SUMMARY OF ONCOLOGIC HISTORY: Oncology History Overview Note  Cancer Staging Diffuse large B cell lymphoma (HCC) Staging form: Hodgkin and Non-Hodgkin Lymphoma, AJCC 8th Edition - Clinical stage from 04/23/2020: Stage IV (Diffuse large B-cell lymphoma) - Signed by [name], MD on 04/29/2020 Stage prefix: Initial diagnosis    Diffuse large B cell lymphoma (HCC)  04/20/2020 Imaging   CT CAP  IMPRESSION: 1. Large anterior mediastinal/prevascular space mass/adenopathy. The mass extends superiorly and abuts the upper trachea. 2. Large hypodense lesion in the right lobe of the liver as well as ill-defined splenic hypodense lesions. Further characterization with MRI without and with contrast recommended. The liver lesion is amenable to percutaneous tissue sampling. 3. Extensive mesenteric and retroperitoneal adenopathy. 4. Sigmoid diverticulosis. No bowel obstruction. Normal appendix. 5. Aortic Atherosclerosis (ICD10-I70.0).   04/23/2020 Cancer Staging   Staging form: Hodgkin and Non-Hodgkin Lymphoma, AJCC 8th Edition - Clinical stage from 04/23/2020: Stage IV (Diffuse large B-cell lymphoma) - Signed by [name], MD on 04/29/2020   04/23/2020 Initial Biopsy   FINAL MICROSCOPIC DIAGNOSIS:   A. LIVER, RIGHT MASS, NEEDLE CORE BIOPSY:  - Large B-cell lymphoma  - See comment     COMMENT:   The sections show needle core biopsy fragments displaying effacement of  the architecture by a dense infiltrate of primarily large atypical  lymphoid appearing cells displaying vesicular chromatin  and small  nucleoli.  This is  associated with apoptosis and brisk mitosis.  The  appearance is diffuse with lack of atypical follicles.  A large battery  of immunohistochemical stains was performed and shows that the atypical  lymphoid cells are positive for LCA, CD20, PAX 5, BCL-2, BCL6, CD5,  MUM-1 (partial).  The atypical lymphoid cells are negative for CD10,  CD30, CD34, CD138, cyclin D1, TdT, and EBV in situ hybridization,  synaptophysin, cytokeratin AE1/AE3, cytokeratin 20, cytokeratin 7,  cytokeratin 5/6, TTF-1, CD56, CDX2, and p40.  There is an admixed  relatively minor population of T-cells as primarily seen with CD3.  The  overall features are consistent with large B-cell lymphoma, ABC subtype.  The lymphomatous process displays expression of CD5.  This phenotype is  seen in 5-10% of mostly de novo large B-cell lymphoma cases or rarely as  a transformation of a low-grade B-cell lymphoma such as small  lymphocytic lymphoma.  Clinical correlation is recommended.    04/25/2020 Imaging   CT AP  IMPRESSION: 1. Development of high-density ascites in the abdomen and pelvis. Findings are most compatible with a small to moderate amount of hemoperitoneum and related to the recent liver biopsy. 2. Extensive lymphadenopathy throughout the abdomen and pelvis with a large mass or nodal mass near the pancreatic head. There are additional lesions involving the liver and spleen. Findings are suggestive for metastatic disease. 3. Development of small bilateral pleural effusions.   These results were called by telephone at the time of interpretation on 04/25/2020 at 1:43 pm to provider ERIC AUSTRIA , who verbally acknowledged these results.   04/26/2020 Initial Diagnosis   High grade non-Hodgkin lymphoma (HCC)   05/01/2020 - 08/27/2020 Chemotherapy   Inpatient R-EPOCH q3weeks for 6 cycles starting 05/01/20 with intrathecal prophylactic treatment starting with C2. Completed      07/04/2020 PET scan    IMPRESSION: 1. Marked reduction in volume of anterior mediastinal mass now with low metabolic activity ( Deauville 2). 2. Reduction in volume of retroperitoneal nodal mass adjacent the pancreas with low metabolic activity ( Deauville 3). 3. No increased metabolic activity associated with the spleen 4. Interval contraction of hepatic hematoma post biopsy. No hypermetabolic elements within the hepatic lesion. 5. Grade 2 anterolisthesis L4 on L5.     10/03/2020 Imaging   PET IMPRESSION: 1. No evidence of residual or recurrent lymphoma. 2. Heterogeneous bilobed mass in the liver, unchanged and reportedly due to chronic post biopsy hematomas. 3. Enlarged prostate.  Associated bladder wall thickening. 4. Aortic atherosclerosis (ICD10-I70.0). Coronary artery calcification.     04/09/2021 Imaging   CT CAP  IMPRESSION: 1. Decreased size of the hypodense splenic lesions. No splenomegaly or adenopathy in the chest abdomen or pelvis. 2. Well-circumscribed hypodense 5.7 cm segment IVa/VIII hepatic lesion which demonstrates a tubular hyperdense area of enhancement which extends laterally to a well-circumscribed 3.7 cm hyperdense lesion. 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 Recommend further evaluation with multiphase CT of the liver with without contrast. 3. Left-sided colonic diverticulosis without findings of acute diverticulitis. 4. Prostatomegaly. 5. Aortic Atherosclerosis (ICD10-I70.0).      Discussed the use of AI scribe software for clinical note transcription with the patient, who gave verbal consent to proceed.  History of Present Illness John [name] is a 78 year old male with diffuse large B-cell lymphoma who presents for a follow-up.  He feels well with no new concerns  since his last visit. His weight is stable at 224 pounds, and his energy level is satisfactory.  He experiences no night sweats or pain.  He continues metoprolol , vitamin C, and calcium, with a reduced metoprolol  dose of 12.5 mg daily. Blood work shows normal kidney and liver function, a slightly low platelet count, a fasting blood sugar level of 118 mg/dL, and normal calcium levels.  A squamous cell carcinoma lesion was removed from the top of his head.     All other systems were reviewed with the patient and are negative.  MEDICAL HISTORY:  Past Medical History:  Diagnosis Date   diffuse large b cell lymphoma 04/2020   Goals of care, counseling/discussion 05/06/2020   Hypertension    Knee pain, right     SURGICAL HISTORY: Past Surgical History:  Procedure Laterality Date   FOOT SURGERY     IR IMAGING GUIDED PORT INSERTION  05/28/2020   IR REMOVAL TUN ACCESS W/ PORT W/O FL MOD SED  02/07/2021   TONSILLECTOMY      I have reviewed the social history and family history with the patient and they are unchanged from previous note.  ALLERGIES:  has no known allergies.  MEDICATIONS:  Current Outpatient Medications  Medication Sig Dispense Refill   Ascorbic Acid  (VITAMIN C WITH ROSE HIPS) 1000 MG tablet Take 1,000 mg by mouth daily.     Calcium Citrate-Vitamin D (CALCIUM CITRATE + D3) 200-6.25 MG-MCG TABS Take 2 tablets by mouth daily. Calcium Citrate/ D3 (400mg /500IU) 1 daily     doxazosin  (CARDURA ) 4 MG tablet Take 4 mg by mouth daily.     metoprolol  succinate (TOPROL -XL) 25 MG 24 hr tablet Take 25 mg by mouth daily.     Misc Natural Products (PROSTATE SUPPORT PO) Take 2 capsules by mouth daily. Prostate Support take 2 times daily     Multiple Vitamins-Minerals (MULTIVITAMIN PO) Take 1 tablet by mouth daily.     Omega-3 Fatty Acids (OMEGA 3 PO) Take 1 tablet by mouth daily.     No current facility-administered medications for this visit.   Facility-Administered Medications Ordered in Other Visits  Medication Dose Route Frequency Provider Last Rate Last Admin   heparin  lock  flush 100 unit/mL  500 Units Intracatheter PRN [name], MD       sodium chloride  flush (NS) 0.9 % injection 10 mL  10 mL Intracatheter PRN [name], MD        PHYSICAL EXAMINATION: ECOG PERFORMANCE STATUS: 0 - Asymptomatic  Vitals:   04/21/24 0824  BP: (!) 138/52  Pulse: 69  Resp: 17  Temp: 98 F (36.7 C)  SpO2: 97%   Wt Readings from Last 3 Encounters:  04/21/24 224 lb 11.2 oz (101.9 kg)  10/23/23 228 lb 9.6 oz (103.7 kg)  04/23/23 226 lb 4.8 oz (102.6 kg)     GENERAL:alert, no distress and comfortable SKIN: skin color, texture, turgor are normal, no rashes or significant lesions EYES: normal, Conjunctiva are pink and non-injected, sclera clear NECK: supple, thyroid  normal size, non-tender, without nodularity LYMPH:  no palpable lymphadenopathy in the cervical, axillary  LUNGS: clear to auscultation and percussion with normal breathing effort HEART: regular rate & rhythm and no murmurs and no lower extremity edema ABDOMEN:abdomen soft, non-tender and normal bowel sounds Musculoskeletal:no cyanosis of digits and no clubbing  NEURO: alert & oriented x 3 with fluent speech, no focal motor/sensory deficits  Physical Exam MEASUREMENTS: Weight- 224.  LABORATORY DATA:  I have reviewed the data  as listed    Latest Ref Rng & Units 04/21/2024    7:46 AM 10/23/2023    8:18 AM 04/23/2023    7:22 AM  CBC  WBC 4.0 - 10.5 K/uL 4.9  4.3  5.3   Hemoglobin 13.0 - 17.0 g/dL 84.8  85.8  85.7   Hematocrit 39.0 - 52.0 % 43.9  40.4  42.3   Platelets 150 - 400 K/uL 130  111  116         Latest Ref Rng & Units 04/21/2024    7:46 AM 10/23/2023    8:18 AM 04/23/2023    7:22 AM  CMP  Glucose 70 - 99 mg/dL 881  882  888   BUN 8 - 23 mg/dL 21  19  18    Creatinine 0.61 - 1.24 mg/dL 9.04  9.11  9.03   Sodium 135 - 145 mmol/L 138  138  140   Potassium 3.5 - 5.1 mmol/L 4.2  3.9  3.9   Chloride 98 - 111 mmol/L 105  107  106   CO2 22 - 32 mmol/L 26  24  28    Calcium 8.9 - 10.3 mg/dL 9.2   8.9  9.1   Total Protein 6.5 - 8.1 g/dL 6.4  6.3  6.5   Total Bilirubin 0.0 - 1.2 mg/dL 0.7  0.5  0.8   Alkaline Phos 38 - 126 U/L 50  45  48   AST 15 - 41 U/L 18  17  19    ALT 0 - 44 U/L 19  17  19        RADIOGRAPHIC STUDIES: I have personally reviewed the radiological images as listed and agreed with the findings in the report. No results found.    No orders of the defined types were placed in this encounter.  All questions were answered. The patient knows to call the clinic with any problems, questions or concerns. No barriers to learning was detected. The total time spent in the appointment was 20 minutes, including review of chart and various tests results, discussions about plan of care and coordination of care plan     Onita Mattock, MD 04/21/2024

## 2025-04-27 ENCOUNTER — Inpatient Hospital Stay: Admitting: Hematology

## 2025-04-27 ENCOUNTER — Inpatient Hospital Stay
